# Patient Record
Sex: Male | Born: 1960 | Race: Black or African American | Hispanic: No | Marital: Single | Smoking: Current every day smoker
Health system: Southern US, Community
[De-identification: ages and names within clinical notes are randomized; demographics above are authoritative.]

## PROBLEM LIST (undated history)

## (undated) DIAGNOSIS — Z9221 Personal history of antineoplastic chemotherapy: Secondary | ICD-10-CM

## (undated) DIAGNOSIS — F1020 Alcohol dependence, uncomplicated: Secondary | ICD-10-CM

## (undated) DIAGNOSIS — C801 Malignant (primary) neoplasm, unspecified: Secondary | ICD-10-CM

## (undated) DIAGNOSIS — M1990 Unspecified osteoarthritis, unspecified site: Secondary | ICD-10-CM

## (undated) DIAGNOSIS — Z923 Personal history of irradiation: Secondary | ICD-10-CM

## (undated) HISTORY — PX: COLONOSCOPY: SHX174

## (undated) HISTORY — DX: Unspecified osteoarthritis, unspecified site: M19.90

## (undated) HISTORY — DX: Alcohol dependence, uncomplicated: F10.20

## (undated) HISTORY — DX: Personal history of antineoplastic chemotherapy: Z92.21

## (undated) HISTORY — PX: OTHER SURGICAL HISTORY: SHX169

---

## 2001-01-15 ENCOUNTER — Emergency Department (HOSPITAL_COMMUNITY): Admission: EM | Admit: 2001-01-15 | Discharge: 2001-01-15 | Payer: Self-pay | Admitting: Emergency Medicine

## 2001-01-15 ENCOUNTER — Encounter: Payer: Self-pay | Admitting: Emergency Medicine

## 2002-11-24 ENCOUNTER — Encounter: Payer: Self-pay | Admitting: Emergency Medicine

## 2002-11-24 ENCOUNTER — Emergency Department (HOSPITAL_COMMUNITY): Admission: EM | Admit: 2002-11-24 | Discharge: 2002-11-24 | Payer: Self-pay | Admitting: Emergency Medicine

## 2004-11-30 ENCOUNTER — Emergency Department (HOSPITAL_COMMUNITY): Admission: EM | Admit: 2004-11-30 | Discharge: 2004-11-30 | Payer: Self-pay | Admitting: Emergency Medicine

## 2006-08-07 ENCOUNTER — Emergency Department (HOSPITAL_COMMUNITY): Admission: EM | Admit: 2006-08-07 | Discharge: 2006-08-07 | Payer: Self-pay | Admitting: Emergency Medicine

## 2009-02-19 ENCOUNTER — Ambulatory Visit: Payer: Self-pay | Admitting: *Deleted

## 2009-02-19 ENCOUNTER — Observation Stay (HOSPITAL_COMMUNITY): Admission: EM | Admit: 2009-02-19 | Discharge: 2009-02-20 | Payer: Self-pay | Admitting: Emergency Medicine

## 2010-03-05 ENCOUNTER — Emergency Department (HOSPITAL_COMMUNITY): Admission: EM | Admit: 2010-03-05 | Discharge: 2010-03-05 | Payer: Self-pay | Admitting: Emergency Medicine

## 2011-01-27 LAB — BASIC METABOLIC PANEL
BUN: 9 mg/dL (ref 6–23)
CO2: 27 mEq/L (ref 19–32)
CO2: 28 mEq/L (ref 19–32)
Calcium: 8.7 mg/dL (ref 8.4–10.5)
Chloride: 103 mEq/L (ref 96–112)
Chloride: 104 mEq/L (ref 96–112)
Creatinine, Ser: 0.73 mg/dL (ref 0.4–1.5)
Creatinine, Ser: 0.74 mg/dL (ref 0.4–1.5)
GFR calc Af Amer: >60 mL/min (ref >60)
GFR calc Af Amer: >60 mL/min (ref >60)
Glucose, Bld: 108 mg/dL — ABNORMAL HIGH (ref 70–99)
Potassium: 4.1 mEq/L (ref 3.5–5.1)

## 2011-01-27 LAB — URINALYSIS, ROUTINE W REFLEX MICROSCOPIC
Ketones, ur: NEGATIVE mg/dL
Nitrite: NEGATIVE
Specific Gravity, Urine: 1.005 (ref 1.005–1.030)
Urobilinogen, UA: 1 mg/dL (ref 0.0–1.0)
pH: 7 (ref 5.0–8.0)

## 2011-01-27 LAB — LIPASE, BLOOD: Lipase: 31 U/L (ref 11–59)

## 2011-01-27 LAB — CBC
HCT: 36.6 % — ABNORMAL LOW (ref 39.0–52.0)
Hemoglobin: 12.5 g/dL — ABNORMAL LOW (ref 13.0–17.0)
Hemoglobin: 13.6 g/dL (ref 13.0–17.0)
MCHC: 34.1 g/dL (ref 30.0–36.0)
MCHC: 34.2 g/dL (ref 30.0–36.0)
MCHC: 34.3 g/dL (ref 30.0–36.0)
MCV: 87.7 fL (ref 78.0–100.0)
MCV: 88.2 fL (ref 78.0–100.0)
MCV: 88.6 fL (ref 78.0–100.0)
Platelets: 84 10*3/uL — ABNORMAL LOW (ref 150–400)
Platelets: 89 10*3/uL — ABNORMAL LOW (ref 150–400)
RBC: 4.1 MIL/uL — ABNORMAL LOW (ref 4.22–5.81)
RBC: 4.15 MIL/uL — ABNORMAL LOW (ref 4.22–5.81)
RBC: 4.51 MIL/uL (ref 4.22–5.81)
RDW: 13.8 % (ref 11.5–15.5)
WBC: 3.5 10*3/uL — ABNORMAL LOW (ref 4.0–10.5)
WBC: 4.4 10*3/uL (ref 4.0–10.5)

## 2011-01-27 LAB — PROTIME-INR: Prothrombin Time: 13.7 seconds (ref 11.6–15.2)

## 2011-01-27 LAB — DIFFERENTIAL
Lymphocytes Relative: 37 % (ref 12–46)
Lymphs Abs: 1.3 10*3/uL (ref 0.7–4.0)
Neutrophils Relative %: 49 % (ref 43–77)

## 2011-01-27 LAB — CARDIAC PANEL(CRET KIN+CKTOT+MB+TROPI): Total CK: 166 U/L (ref 7–232)

## 2011-01-27 LAB — TSH: TSH: 3.703 u[IU]/mL (ref 0.350–4.500)

## 2011-01-27 LAB — COMPREHENSIVE METABOLIC PANEL
BUN: 10 mg/dL (ref 6–23)
CO2: 27 mEq/L (ref 19–32)
Calcium: 9.4 mg/dL (ref 8.4–10.5)
Creatinine, Ser: 0.83 mg/dL (ref 0.4–1.5)
GFR calc Af Amer: >60 mL/min (ref >60)
GFR calc non Af Amer: >60 mL/min (ref >60)
Glucose, Bld: 96 mg/dL (ref 70–99)

## 2011-01-27 LAB — LACTATE DEHYDROGENASE: LDH: 113 U/L (ref 94–250)

## 2011-01-27 LAB — RAPID URINE DRUG SCREEN, HOSP PERFORMED
Cocaine: NOT DETECTED
Opiates: NOT DETECTED

## 2011-01-27 LAB — APTT: aPTT: 30 seconds (ref 24–37)

## 2011-03-03 NOTE — Discharge Summary (Signed)
NAMECAMILLO, QUADROS               ACCOUNT NO.:  [id number]   MEDICAL RECORD NO.:  [id number]          PATIENT TYPE:  INP   LOCATION:  2030                         FACILITY:  [facility]   PHYSICIAN:  Madaline Guthrie, M.D.    DATE OF BIRTH:  Dec 30, 1960   DATE OF ADMISSION:  02/19/2009  DATE OF DISCHARGE:  02/20/2009                               DISCHARGE SUMMARY   ATTENDING PHYSICIAN:  Dr. Wyonia Hough.   DISCHARGE DIAGNOSES:  1. Rectal bleeding - secondary to internal hemorrhoids and anal      fissure per sigmoidoscopy results.  2. Polysubstance abuse, alcohol and tobacco - chronic, still currently      using.  3. Thrombocytopenia and leukopenia - likely secondary to chronic      alcohol use, unknown baseline.   DISCHARGE MEDICATIONS:  1. MiraLax 17 g powder use once daily.  2. Anusol cream use 2 to 3 times daily intra-anally using applicator      tip.  3. Multivitamin take 1 tablet daily.   DISPOSITION AND FOLLOW-UP:  The patient was discharged from the hospital  in stable condition with continued small rectal bleed.  She will follow  up in outpatient clinic with Dr. Aldine Contes on May 27 at 2:30 p.m.  On  follow-up appointment, may check CBCs since noted during the admission  to have leukopenia as well as thrombocytopenia.  During the  hospitalization, flexible sigmoidoscopy was done, and the patient was  found to have internal hemorrhoids and anal fissure which was thought to  be source of bleeding.  Per Dr. Donavan Burnet recommendation, the patient is  to use daily MiraLax and Anusol cream intra-anally.  Colonoscopy is  recommended in 2-3 years given his age and alcohol use as well as  recurrent bleeding in future if the patient desires to have hemorrhoids  and fissure repairs, surgical referral can be made but not necessary at  this time.  Please follow up on abstinence from alcohol and smoking and  assess if the patient has been compliant with medicine and cream  recommended.   PROCEDURES:   May 4, acute abdominal series shows no acute findings chest  or abdomen, COPD changes.  Feb 20, 2009, flexible sigmoidoscopy showed anal fissure in distal rectum  with clean base and large multiple internal hemorrhoids, nonbloody  stool, brown within no rectosigmoid pathologies.   CONSULTATIONS:  Dr. Matthias Hughs, GI.   HISTORY OF PRESENT ILLNESS:  A 50 year old male with chronic alcohol  use, daily use of aspirin over 1 year, no other significant medical  history, presents to ED with main concern of bright red blood per rectum  less than 1/2 cup coating bowel movement noticed 2 days prior to  admission.  Occasionally but not consistently associated with lower  quadrant abdominal pain, nonradiating, but improved with applying  pressure to the area.  In addition, reports dizziness upon standing that  started 2 days ago as well.  Has had similar episode in the past which  resolved on its own.  The patient denies rectal pain, tenesmus, rectal  trauma.  No systemic symptoms, nausea, vomiting, diarrhea, constipation.  No urinary concerns.  No family history of colorectal cancer.   PHYSICAL EXAMINATION:  The patient lying in bed not in acute distress.  HEENT:  No scleral icterus.  No conjunctival pallor.  Moist oral mucosa.  CARDIOVASCULAR SYSTEM:  Regular rate and rhythm.  Heart rate 68.  No  murmurs.  No JVD.  LUNGS:  Clear to auscultation bilaterally.  No wheezing.  Good air  movement.  ABDOMEN:  Soft, nontender, nondistended.  Bowel sounds normal.  Mild  lower abdominal prominence with straining.  No guarding or rebound.  EXTREMITIES:  No edema.  No cyanosis.  GENITOURINARY:  No external fissures.  No masses.  Good rectal tone.  Brown stool.  FOBT positive.  NEUROLOGICAL:  Nonfocal, tremor at outstretched hands.  VITAL SIGNS:  Temperature 98.6, blood pressure 107/68, pulse 68,  respirations 18, saturating 100% on room air.   LABORATORY DATA:  Hemoglobin 13.6, white blood cells 3.6 and  platelets  96, MCV 88.  Lipase 31.  UDS negative.  Lactic acid 1.4.  LDH 113.  PT  13.7, INR 1, PTT 30.  TSH 3.703.   HOSPITAL COURSE:  1. Bright red blood per rectum, based on flexible sigmoidoscopy      findings secondary to internal hemorrhoids and anal fissure.  No      polyps or other rectosigmoid pathologies noted.  Stool was brown      and nonbloody.  Given the patient's age and risk factors,      colonoscopy is recommended in 2-3 years which can be done in an      outpatient setting.  Surgical referral might be needed in future if      the patient desires, not indicated at this time.  CBC and coags      were stable during the admission in terms of hemoglobin (except for      WBCs and platelets).  The patient was recommended to use MiraLax      powder and Anusol cream daily.  2. Thrombocytopenia and leukopenia, likely related to chronic alcohol      use.  Unknown baseline, but this can be followed up in outpatient      setting.  Will repeat CBC and determine if the numbers on this      admission are indeed patient's baseline.  3. Polysubstance abuse.  Ileus was negative for cocaine, THC or      benzodiazepines.  However, the patient continues to abuse tobacco      and alcohol.  Cessation consult was provided, and the patient seems      to have rather good insight into risk of continued use.  Will      follow up on this issue in outpatient clinic as well.   VITALS ON DISCHARGE:  Temperature 98, pulse 52, respirations 20, blood  pressure 116/66, saturating 99% on room air.   LABORATORY DATA:  CBC:  White blood cell 3.5, hemoglobin 12.4, platelets  89.  Sodium 134, potassium 4, chloride 103, bicarb 27, BUN 9, creatinine  0.74, glucose 108 and calcium 8.7.  Cardiac panel within normal limits.   Over 30 minutes was spent on discharging the patient.      Mliss Sax, MD  Electronically Signed      Madaline Guthrie, M.D.  Electronically Signed    IM/MEDQ  D:  02/20/2009   T:  02/20/2009  Job:  [id number]

## 2011-03-03 NOTE — Consult Note (Signed)
[name], [name]               ACCOUNT NO.:  [id number]   MEDICAL RECORD NO.:  [id number]          PATIENT TYPE:  INP   LOCATION:  2030                         FACILITY:  [facility]   PHYSICIAN:  Bernette Redbird, M.D.   DATE OF BIRTH:  03/06/1961   DATE OF CONSULTATION:  02/19/2009  DATE OF DISCHARGE:                                 CONSULTATION   The Internal Medicine Teaching Service (Dr. Wyonia Hough, attending) asked [name]  to see this 50 year old gentleman because of rectal bleeding.   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  He has a history of periodic rectal  bleeding, which is painless and consists of bright red blood per rectum  in association with defecation.  It typically last for couple of days  and then goes away for a period of time.  He presented to the emergency  room today because of this bleeding and recent feelings of weakness, but  his hemoglobin in the emergency room was normal.  There is no family  history of colorectal neoplasia.   The current episode he has only consisted of two bowel movements  associated with blood over the past 2 days.  The blood is in the toilet  bowl and seems to the stool, but not be remixed with it.  No  associated perianal pain, no history of constipation.   PAST MEDICAL HISTORY:  No known allergies.   OUTPATIENT MEDICATIONS:  Daily aspirin, occasional Advil, and Goody  powders several times a week.   OPERATIONS:  I believe he has had surgery for an incarcerated hernia.   CHRONIC MEDICAL ILLNESSES:  None.   HABITS:  The patient consumes ethanol daily including gin on the  weekends and about 200 ounces of beer per day.  He smokes one pack per  day.   FAMILY HISTORY:  Negative for colorectal neoplasia.   SOCIAL HISTORY:  Lives alone, does construction work, has a tenth grade  education.   REVIEW OF SYSTEMS:  See HPI.   PHYSICAL EXAMINATION:  GENERAL:  A lean, wiry,  healthy-appearing African  American male in no acute distress.  Anicteric.  No frank pallor.  CHEST:  Clear.  HEART:  Normal.  ABDOMEN:  Muscular, but without any evident mass or tenderness.   LABORATORY DATA:  Stool was hemoccult positive in the emergency room.  Hemoglobin 13.6, platelets 96,000.  AST 46, INR normal.   IMPRESSION:  Recurrent painless intermittent small volume hematochezia  in a pattern most consistent with hemorrhoidal bleeding.  Despite risk  factors for ulcer disease, there is no clinical suggestion of an acute  upper GI hemorrhage, in that he is not having profuse bleeding, clinical  instability, or a drop in hemoglobin.   PLAN:  I do feel that evaluation is warranted since he is in his 110s and  has previously unevaluated rectal bleeding.  We offered him the options  of expectant management, which I did not prefer, sigmoidoscopic  evaluation, which would address specifically the rectal bleeding he is  having, or a full colonoscopy, which  would address the rectal bleeding  plus screen for colon cancer.  He prefers sigmoidoscopic evaluation at  this time.  We will plan to do it tomorrow, unprepped, to help be sure  the blood is not coming from higher up.  The minimal risks of that  procedure were reviewed and the patient is agreeable.           ______________________________  Bernette Redbird, M.D.     RB/MEDQ  D:  02/19/2009  T:  02/20/2009  Job:  [id number]

## 2011-03-03 NOTE — Op Note (Signed)
NAMEDARE, SANGER               ACCOUNT NO.:  [id number]   MEDICAL RECORD NO.:  [id number]          PATIENT TYPE:  OBV   LOCATION:  2030                         FACILITY:  [facility]   PHYSICIAN:  Bernette Redbird, M.D.   DATE OF BIRTH:  [birth year]   DATE OF PROCEDURE:  02/20/2009  DATE OF DISCHARGE:  02/20/2009                               OPERATIVE REPORT   PROCEDURE:  Flexible sigmoidoscopy.   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   FINDINGS:  Probable anal fissure and prominent internal hemorrhoids.  No  blood in the rectosigmoid lumen.   PROCEDURE IN DETAIL:  The nature, purpose, and risks of the procedure  have been discussed with the patient who provided written consent and  was brought from his hospital room to the Endoscopy Unit.  This  procedure was done unprepped and unsedated.  The reason for no prep was  to try to ascertain whether or not blood was coming from above.  The  Pentax pediatric video colonoscope was inserted following the perianal  exam that was negative and a digital rectal exam that was pertinent for  a marked increase in anal sphincter tone.  The prostate bed was flat.   There was no blood whatsoever in the rectosigmoid lumen, up to the limit  of the exam which was approximately 30 cm.  There was a small to  moderate amount of formed stool, scattered here and there in the  rectosigmoid lumen.  It is not felt that this would have obscured any  major or diffuse abnormalities.  The stool was completely nonbloody in  appearance.  The rectum had a very nice vascular pattern, no evidence of  proctitis, no evidence of colitis, no evidence of diverticular disease,  and no evidence of polyps or masses.   A retroflexed view of the distal rectum showed a linear slit with smooth  overlying mucosa suggestive of an anal  fissure, but without any stigma  of hemorrhage.  Pullout through the anal canal demonstrated moderate  internal hemorrhoids.   No biopsies were obtained.  The patient tolerated the procedure well and  there were no apparent complications.   IMPRESSION:  Rectal outlet bleeding.  Possible anal fissure, but the  pattern of bleeding including the absence of pain and the generous  amount of blood brings to mind hemorrhoidal bleeding rather than an anal  fissure.   RECOMMENDATIONS:  1. Reassurance.  2. Maintenance of soft stools, if necessary by use of fiber      supplementation and/or MiraLax  3. If the patient would like definitive treatment of his apparent      hemorrhoidal bleeding, surgical consultation would be appropriate.      In the meantime, intra-anal application of ointments such as Anusol      might help a little bit, although in general, they were better for      hemorrhoidal irritation than hemorrhoidal bleeding.           ______________________________  Bernette Redbird,  M.D.     RB/MEDQ  D:  02/20/2009  T:  02/21/2009  Job:  [id number]

## 2011-03-06 NOTE — Consult Note (Signed)
[facility]  Patient:    [name], [name]                      MRN: [id number] Proc. Date: 01/15/01 Adm. Date:  [id number] Disc. Date: [id number] Attending:  Benny Lennert CC:         [facility]   Consultation Report  CHIEF COMPLAINT:  Right facial swelling.  HISTORY OF PRESENT ILLNESS:  The patient is a 50 year old black male, 9 or 10 days ago noted that his posterior right upper maxillary molar was giving him discomfort. It has previously been broken. He was attempting to get to [facility] and have a dentist evaluate him. Four or five days ago, he developed a swelling at the right angle of mandible which has gotten progressively larger and slightly tender. No a trismus. No breathing difficulty. He placed an aspirin tablet in his posterior right gingival buccal fold last evening to try to numb the pain which did not help. No breathing or swallowing difficulty. He does smoke one pack per day. No prior history of cancer. No other neck masses that he is aware of. No fever. He did not have much acne in his youth. He does have an apparent cystic lesion in the skin over the right malar eminence which has been more or less quiescent over several years time.  ALLERGIES:  No known medical allergies.  CURRENT MEDICATIONS:  None.  PAST MEDICAL HISTORY:  No exposure to HIV. No diabetes.  PHYSICAL EXAMINATION:  GENERAL:  This is a trim, basically healthy-appearing, middle-aged, black male.  HEENT:  He has a large swelling over the right angle of the mandible which is slightly tender, slightly fluctuant, and with no overlying skin changes. No facial weakness. He has a hyperpigmented area over the malar skin with some slight fluctuants beneath that is consistent with a chronic dermal cyst. Cranial nerves intact. Ear canals clear. Anterior nose clear. Oral cavity reveals several missing teeth, several teeth eroded down to the gum line,  and several broken posterior molars, especially the right maxillary third molar. He has significant leukoplakia and irregular configuration of the mucosa of the posterior right gingival buccal fold that is consistent with erosion by the aspirin yesterday. No clear evidence of cancer. Oropharynx otherwise clear. Did not examine nasopharynx or hypopharynx.  NECK:  Remarkable for a 1.5 cm slightly fluctuant fullness over the angle of the mandible on the right side and the facial lesion as described above. No other neck adenopathy. He does have a 3-4 cm soft subcutaneous mass in the right posterior triangle consistent with lipoma.  LABORATORY DATA:  X-ray:  Panoramic x-ray showed missing teeth, caries, but no sign of a dental abscess or fracture.  CT scan showed a fluid-filled lesion over the right angle of the mandible consistent with an abscess.  IMPRESSION:  Probable odontogenic abscess. Rule out actinomycosis. Rule out atypical Mycobacterium.  PROCEDURE:  With informed consent, cleaned and anesthetize the area with 1% Xylocaine with 1:100,000 epinephrine, 5 cc total. An 18-gauge needle was placed into the cyst or abscess cavity and approximately 3 cc of bloody, frank pus with an anaerobic odor was evacuated. This was sent for cultures and Gram stain for acid fast, actinomycosis, and routine aerobic and anaerobic bacterial cultures.  Following this, a 5 ml incision was made into the abscess cavity with the incision lying in the relaxed skin tension line. Moderate blood and pus were evacuated. The wound was irrigated  with approximately 30 cc of sterile saline. A 1/4-inch Penrose drain was placed into the abscess cavity and secured to the skin with a 3-0 nylon stitch. Hemostasis was spontaneous. The patient tolerated the procedure nicely. An absorbent dressing was placed.  PLAN:  Gave him a prescription for penicillin VK 500 mg to take at the rate of 1 g q.i.d. for the first 2 days,  then back off to 0.5 g q.i.d. I would like to see him back in my office in 2 days to remove the drain. I would like him to get with a dentist as early as possible to have the teeth dealt with to prevent this from having further difficulties. He understands and agrees. DD:  01/15/01 TD:  01/16/01 Job: [id number]

## 2011-05-14 ENCOUNTER — Emergency Department (HOSPITAL_COMMUNITY)
Admission: EM | Admit: 2011-05-14 | Discharge: 2011-05-14 | Disposition: A | Discharge status: Home / Self Care | Payer: Self-pay | Attending: Emergency Medicine | Admitting: Emergency Medicine

## 2011-05-14 DIAGNOSIS — F17200 Nicotine dependence, unspecified, uncomplicated: Secondary | ICD-10-CM | POA: Insufficient documentation

## 2011-05-14 DIAGNOSIS — R300 Dysuria: Secondary | ICD-10-CM | POA: Insufficient documentation

## 2011-05-14 DIAGNOSIS — N342 Other urethritis: Secondary | ICD-10-CM | POA: Insufficient documentation

## 2011-05-14 LAB — URINALYSIS, ROUTINE W REFLEX MICROSCOPIC
Bilirubin Urine: NEGATIVE
Glucose, UA: NEGATIVE mg/dL
Ketones, ur: NEGATIVE mg/dL
Nitrite: NEGATIVE
Protein, ur: NEGATIVE mg/dL
Specific Gravity, Urine: 1.012 (ref 1.005–1.030)
Urobilinogen, UA: 1 mg/dL (ref 0.0–1.0)
pH: 6.5 (ref 5.0–8.0)

## 2011-05-14 LAB — URINE MICROSCOPIC-ADD ON

## 2012-03-25 ENCOUNTER — Encounter (HOSPITAL_COMMUNITY): Payer: Self-pay

## 2012-03-25 ENCOUNTER — Emergency Department (HOSPITAL_COMMUNITY)
Admission: EM | Admit: 2012-03-25 | Discharge: 2012-03-25 | Disposition: A | Discharge status: Home / Self Care | Payer: Self-pay | Attending: Emergency Medicine | Admitting: Emergency Medicine

## 2012-03-25 DIAGNOSIS — F1010 Alcohol abuse, uncomplicated: Secondary | ICD-10-CM | POA: Insufficient documentation

## 2012-03-25 DIAGNOSIS — F1410 Cocaine abuse, uncomplicated: Secondary | ICD-10-CM | POA: Insufficient documentation

## 2012-03-25 LAB — RAPID URINE DRUG SCREEN, HOSP PERFORMED
Amphetamines: NOT DETECTED
Barbiturates: NOT DETECTED
Cocaine: POSITIVE — AB
Opiates: NOT DETECTED
Tetrahydrocannabinol: NOT DETECTED

## 2012-03-25 LAB — BASIC METABOLIC PANEL
BUN: 11 mg/dL (ref 6–23)
Calcium: 9.8 mg/dL (ref 8.4–10.5)
GFR calc non Af Amer: >90 mL/min (ref >90)
Glucose, Bld: 117 mg/dL — ABNORMAL HIGH (ref 70–99)

## 2012-03-25 LAB — CBC
HCT: 40.6 % (ref 39.0–52.0)
Hemoglobin: 13.7 g/dL (ref 13.0–17.0)
MCH: 29.3 pg (ref 26.0–34.0)
MCHC: 33.7 g/dL (ref 30.0–36.0)
RDW: 14.5 % (ref 11.5–15.5)

## 2012-03-25 MED ORDER — ALUM & MAG HYDROXIDE-SIMETH 200-200-20 MG/5ML PO SUSP: 30.0000 mL | ORAL | Status: DC | PRN

## 2012-03-25 MED ORDER — VITAMIN B-1 100 MG PO TABS
100.0000 mg | ORAL_TABLET | Freq: Every day | ORAL | Status: DC
  Administered 2012-03-25: 100 mg via ORAL
  Filled 2012-03-25: qty 1

## 2012-03-25 MED ORDER — FOLIC ACID 1 MG PO TABS
1.0000 mg | ORAL_TABLET | Freq: Every day | ORAL | Status: DC
  Administered 2012-03-25: 1 mg via ORAL
  Filled 2012-03-25: qty 1

## 2012-03-25 MED ORDER — ACETAMINOPHEN 325 MG PO TABS: 650.0000 mg | ORAL_TABLET | ORAL | Status: DC | PRN

## 2012-03-25 MED ORDER — LORAZEPAM 1 MG PO TABS: 1.0000 mg | ORAL_TABLET | Freq: Four times a day (QID) | ORAL | Status: DC | PRN

## 2012-03-25 MED ORDER — LORAZEPAM 2 MG/ML IJ SOLN: 1.0000 mg | Freq: Four times a day (QID) | INTRAMUSCULAR | Status: DC | PRN

## 2012-03-25 MED ORDER — NICOTINE 21 MG/24HR TD PT24
21.0000 mg | MEDICATED_PATCH | Freq: Every day | TRANSDERMAL | Status: DC
  Administered 2012-03-25: 21 mg via TRANSDERMAL
  Filled 2012-03-25: qty 1

## 2012-03-25 MED ORDER — THIAMINE HCL 100 MG/ML IJ SOLN: 100.0000 mg | Freq: Every day | INTRAMUSCULAR | Status: DC

## 2012-03-25 MED ORDER — IBUPROFEN 200 MG PO TABS: 600.0000 mg | ORAL_TABLET | Freq: Three times a day (TID) | ORAL | Status: DC | PRN

## 2012-03-25 MED ORDER — ONDANSETRON HCL 8 MG PO TABS: 4.0000 mg | ORAL_TABLET | Freq: Three times a day (TID) | ORAL | Status: DC | PRN

## 2012-03-25 MED ORDER — ZOLPIDEM TARTRATE 5 MG PO TABS: 5.0000 mg | ORAL_TABLET | Freq: Every evening | ORAL | Status: DC | PRN

## 2012-03-25 MED ORDER — ADULT MULTIVITAMIN W/MINERALS CH
1.0000 | ORAL_TABLET | Freq: Every day | ORAL | Status: DC
  Administered 2012-03-25: 1 via ORAL
  Filled 2012-03-25 (×2): qty 1

## 2012-03-25 NOTE — ED Provider Notes (Signed)
Medical screening examination/treatment/procedure(s) were performed by non-physician practitioner and as supervising physician I was immediately available for consultation/collaboration.   Dione Booze, MD 03/25/12 [phone]

## 2012-03-25 NOTE — ED Notes (Signed)
Pt accepted to [facility], working on getting his ride available

## 2012-03-25 NOTE — BH Assessment (Signed)
Assessment Note   [name] is an 51 y.o. male that presented requesting detox from Cocaine and ETOH.  Pt reports relapsing three months ago after a stint at [facility] and prior treatment at [facility].  Pt reports drinking approximately 5 40 oz drinks QD + 1 pint at times and 30 dollars worth of Cocaine every few days.  Pt reports feeling slightly nauseaus, anxious, and tremulous.  Pt has no previous mental health history and denies SI, HI, or any active psychosis.  Pt is able to contract for safety.  Pt voices minimal supports and is currently homeless.  Pt states that he is vested in treatment and in going to rehab afterwards.   Axis I: Alcohol Abuse Axis II: Deferred Axis III: History reviewed. No pertinent past medical history. Axis IV: housing problems, other psychosocial or environmental problems and problems with primary support group Axis V: 41-50 serious symptoms  Past Medical History: History reviewed. No pertinent past medical history.  History reviewed. No pertinent past surgical history.  Family History: No family history on file.  Social History:  reports that he has been smoking.  He does not have any smokeless tobacco history on file. He reports that he drinks alcohol. He reports that he uses illicit drugs (Cocaine).  Additional Social History:  Alcohol / Drug Use Pain Medications: No Prescriptions: No Over the Counter: No History of alcohol / drug use?: Yes Substance #1 Name of Substance 1: Cocaine 1 - Age of First Use: 31 1 - Amount (size/oz): 30 dollars 1 - Frequency: every weekend 1 - Duration: 3 months 1 - Last Use / Amount: two days ago Substance #2 Name of Substance 2: ETOH 2 - Age of First Use: 19 2 - Amount (size/oz): 5 40's  2 - Frequency: QD 2 - Duration: 3 mths 2 - Last Use / Amount: yesterday  CIWA: CIWA-Ar BP: 114/71 mmHg Pulse Rate: 69  Nausea and Vomiting: 2 Tactile Disturbances: none Tremor: two Auditory Disturbances: not present Paroxysmal  Sweats: no sweat visible Visual Disturbances: not present Anxiety: two Headache, Fullness in Head: none present Agitation: normal activity Orientation and Clouding of Sensorium: oriented and can do serial additions CIWA-Ar Total: 6  COWS:    Allergies: No Known Allergies  Home Medications:  (Not in a hospital admission)  OB/GYN Status:  No LMP for male patient.  General Assessment Data Location of Assessment: [facility] ED Living Arrangements: Other (Comment) Can pt return to current living arrangement?: No Admission Status: Voluntary Is patient capable of signing voluntary admission?: Yes Transfer from: Acute Hospital Referral Source: MD  Education Status Is patient currently in school?: No  Risk to self Suicidal Ideation: No Suicidal Intent: No Is patient at risk for suicide?: No Suicidal Plan?: No Access to Means: No What has been your use of drugs/alcohol within the last 12 months?: Relapsed on Crack and ETOH x3 months ago Previous Attempts/Gestures: No How many times?: 0  Triggers for Past Attempts: Unpredictable Intentional Self Injurious Behavior: None Family Suicide History: No Recent stressful life event(s): Conflict (Comment);Turmoil (Comment) Persecutory voices/beliefs?: No Depression: No Depression Symptoms: Guilt;Loss of interest in usual pleasures Suicide prevention information given to non-admitted patients: Not applicable  Risk to Others Homicidal Ideation: No Thoughts of Harm to Others: No Current Homicidal Intent: No Current Homicidal Plan: No Access to Homicidal Means: No Identified Victim: n/a History of harm to others?: No Assessment of Violence: None Noted Violent Behavior Description: n/a Does patient have access to weapons?: No Criminal Charges  Pending?: No Does patient have a court date: No  Psychosis Hallucinations: None noted Delusions: None noted  Mental Status Report Eye Contact: Good Motor Activity: Unremarkable Speech:  Logical/coherent Level of Consciousness: Quiet/awake Mood: Ambivalent Affect: Apathetic;Sad Anxiety Level: Moderate Thought Processes: Relevant Judgement: Impaired Orientation: Person;Place;Time;Situation Obsessive Compulsive Thoughts/Behaviors: Minimal  Cognitive Functioning Concentration: Decreased Memory: Recent Intact;Remote Impaired IQ: Average Insight: Fair Impulse Control: Poor Appetite: Poor Weight Loss: 5  Weight Gain: 0  Sleep: Decreased Total Hours of Sleep: 6  Vegetative Symptoms: None  ADLScreening [facility] Assessment Services) Patient's cognitive ability adequate to safely complete daily activities?: Yes Patient able to express need for assistance with ADLs?: Yes Independently performs ADLs?: Yes  Abuse/Neglect [facility]) Physical Abuse: Denies Verbal Abuse: Denies Sexual Abuse: Denies  Prior Inpatient Therapy Prior Inpatient Therapy: Yes Prior Therapy Dates: 4 months ago Prior Therapy Facilty/Provider(s): [facility] Reason for Treatment: rehabilitation  Prior Outpatient Therapy Prior Outpatient Therapy: Yes Prior Therapy Dates: years ago Prior Therapy Facilty/Provider(s): [facility] Reason for Treatment: substance abuse  ADL Screening (condition at time of admission) Patient's cognitive ability adequate to safely complete daily activities?: Yes Patient able to express need for assistance with ADLs?: Yes Independently performs ADLs?: Yes       Abuse/Neglect Assessment (Assessment to be complete while patient is alone) Physical Abuse: Denies Verbal Abuse: Denies Sexual Abuse: Denies Values / Beliefs Cultural Requests During Hospitalization: None Spiritual Requests During Hospitalization: None        Additional Information 1:1 In Past 12 Months?: No CIRT Risk: No Elopement Risk: No Does patient have medical clearance?: Yes     Disposition: Referred to [facility] for inpatient detox. Disposition Disposition of Patient: Referred to [facility]) Patient referred  to: [facility]  On Site Evaluation by:   Reviewed with Physician:     Angelica Ran 03/25/2012 2:59 PM

## 2012-03-25 NOTE — ED Notes (Signed)
Request hep for detox from alcohol and cocaine.  Pt. Was at [facility] but relapsed.  He would like to go back there.   Pt.  Feels weary

## 2012-03-25 NOTE — ED Provider Notes (Signed)
History     CSN: [id number]  Arrival date & time 03/25/12  1227   First MD Initiated Contact with Patient 03/25/12 1312      Chief Complaint  Patient presents with  . Medical Clearance    (Consider location/radiation/quality/duration/timing/severity/associated sxs/prior treatment) HPI  Patient to the ED requesting detox from alcohol and cocaine. He was discharged from [facility] 4 months ago, last 1 month sober and then has been relapsing for the last 3 months. He denies being HI or SI and he does not have any psychosis. Pt in NAD and VSS   History reviewed. No pertinent past medical history.  History reviewed. No pertinent past surgical history.  No family history on file.  History  Substance Use Topics  . Smoking status: Current Everyday Smoker  . Smokeless tobacco: Not on file  . Alcohol Use: Yes      Review of Systems   HEENT: denies blurry vision or change in hearing PULMONARY: Denies difficulty breathing and SOB CARDIAC: denies chest pain or heart palpitations MUSCULOSKELETAL:  denies being unable to ambulate ABDOMEN AL: denies abdominal pain GU: denies loss of bowel or urinary control NEURO: denies numbness and tingling in extremities      Allergies  Review of patient's allergies indicates no known allergies.  Home Medications  No current outpatient prescriptions on file.  BP 114/71  Pulse 69  Temp(Src) 98.7 F (37.1 C) (Oral)  Resp 16  SpO2 99%  Physical Exam  Nursing note and vitals reviewed. Constitutional: He appears well-developed and well-nourished. No distress.  HENT:  Head: Normocephalic and atraumatic.  Eyes: Pupils are equal, round, and reactive to light.  Neck: Normal range of motion. Neck supple.  Cardiovascular: Normal rate and regular rhythm.   Pulmonary/Chest: Effort normal.  Abdominal: Soft.  Neurological: He is alert.  Skin: Skin is warm and dry.    ED Course  Procedures (including critical care time)   Labs Reviewed   URINE RAPID DRUG SCREEN (HOSP PERFORMED)  CBC  BASIC METABOLIC PANEL  ETHANOL   No results found.   1. Cocaine abuse   2. Alcohol abuse       MDM  ACT to consult         Dorthula Matas, PA 03/25/12 1344

## 2012-03-25 NOTE — Discharge Instructions (Signed)
Alcohol Problems Most adults who drink alcohol drink in moderation (not a lot) are at low risk for developing problems related to their drinking. However, all drinkers, including low-risk drinkers, should know about the health risks connected with drinking alcohol. RECOMMENDATIONS FOR LOW-RISK DRINKING  Drink in moderation. Moderate drinking is defined as follows:   Men - no more than 2 drinks per day.   Nonpregnant women - no more than 1 drink per day.   Over age 97 - no more than 1 drink per day.  A standard drink is 12 grams of pure alcohol, which is equal to a 12 ounce bottle of beer or wine cooler, a 5 ounce glass of wine, or 1.5 ounces of distilled spirits (such as whiskey, brandy, vodka, or rum).  ABSTAIN FROM (DO NOT DRINK) ALCOHOL:  When pregnant or considering pregnancy.   When taking a medication that interacts with alcohol.   If you are alcohol dependent.   A medical condition that prohibits drinking alcohol (such as ulcer, liver disease, or heart disease).  DISCUSS WITH YOUR CAREGIVER:  If you are at risk for coronary heart disease, discuss the potential benefits and risks of alcohol use: Light to moderate drinking is associated with lower rates of coronary heart disease in certain populations (for example, men over age 87 and postmenopausal women). Infrequent or nondrinkers are advised not to begin light to moderate drinking to reduce the risk of coronary heart disease so as to avoid creating an alcohol-related problem. Similar protective effects can likely be gained through proper diet and exercise.   Women and the elderly have smaller amounts of body water than men. As a result women and the elderly achieve a higher blood alcohol concentration after drinking the same amount of alcohol.   Exposing a fetus to alcohol can cause a broad range of birth defects referred to as Fetal Alcohol Syndrome (FAS) or Alcohol-Related Birth Defects (ARBD). Although FAS/ARBD is connected with  excessive alcohol consumption during pregnancy, studies also have reported neurobehavioral problems in infants born to mothers reporting drinking an average of 1 drink per day during pregnancy.   Heavier drinking (the consumption of more than 4 drinks per occasion by men and more than 3 drinks per occasion by women) impairs learning (cognitive) and psychomotor functions and increases the risk of alcohol-related problems, including accidents and injuries.  CAGE QUESTIONS:   Have you ever felt that you should Cut down on your drinking?   Have people Annoyed you by criticizing your drinking?   Have you ever felt bad or Guilty about your drinking?   Have you ever had a drink first thing in the morning to steady your nerves or get rid of a hangover (Eye opener)?  If you answered positively to any of these questions: You may be at risk for alcohol-related problems if alcohol consumption is:   Men: Greater than 14 drinks per week or more than 4 drinks per occasion.   Women: Greater than 7 drinks per week or more than 3 drinks per occasion.  Do you or your family have a medical history of alcohol-related problems, such as:  Blackouts.   Sexual dysfunction.   Depression.   Trauma.   Liver dysfunction.   Sleep disorders.   Hypertension.   Chronic abdominal pain.   Has your drinking ever caused you problems, such as problems with your family, problems with your work (or school) performance, or accidents/injuries?   Do you have a compulsion to drink or a preoccupation  Do you have a compulsion to drink or a preoccupation with drinking?   Do you have poor control or are you unable to stop drinking once you have started?   Do you have to drink to avoid withdrawal symptoms?   Do you have problems with withdrawal such as tremors, nausea, sweats, or mood disturbances?   Does it take more alcohol than in the past to get you high?   Do you feel a strong urge to drink?   Do you change your plans so that you can have a drink?   Do you ever drink in the morning to relieve  the shakes or a hangover?  If you have answered a number of the previous questions positively, it may be time for you to talk to your caregivers, family, and friends and see if they think you have a problem. Alcoholism is a chemical dependency that keeps getting worse and will eventually destroy your health and relationships. Many alcoholics end up dead, impoverished, or in prison. This is often the end result of all chemical dependency.   Do not be discouraged if you are not ready to take action immediately.   Decisions to change behavior often involve up and down desires to change and feeling like you cannot decide.   Try to think more seriously about your drinking behavior.   Think of the reasons to quit.  WHERE TO GO FOR ADDITIONAL INFORMATION    The National Institute on Alcohol Abuse and Alcoholism (NIAAA)www.niaaa.nih.gov   National Council on Alcoholism and Drug Dependence (NCADD)www.ncadd.org   American Society of Addiction Medicine (ASAM)www.asam.org  Document Released: 10/05/2005 Document Revised: 09/24/2011 Document Reviewed: 05/23/2008  Exit[employer] Patient Information 2012 [employer].

## 2012-03-25 NOTE — ED Notes (Signed)
Pt presents to department for evaluation of detox from crack cocaine and ETOH. Pt states he drinks x1 pint of white liquor and 40oz of beer everyday, and also smokes crack every weekend. Pt states he was recently in the detox program at [facility], but has relapsed. Pt requesting long term detox today. Denies pain. Pt is conscious alert and oriented x4. He is calm and cooperative at the time. Denies SI/HI at present.

## 2013-05-24 ENCOUNTER — Emergency Department (HOSPITAL_COMMUNITY): Payer: Self-pay

## 2013-05-24 ENCOUNTER — Encounter (HOSPITAL_COMMUNITY): Payer: Self-pay | Admitting: Adult Health

## 2013-05-24 ENCOUNTER — Emergency Department (HOSPITAL_COMMUNITY)
Admission: EM | Admit: 2013-05-24 | Discharge: 2013-05-24 | Disposition: A | Discharge status: Home / Self Care | Payer: Self-pay | Attending: Emergency Medicine | Admitting: Emergency Medicine

## 2013-05-24 DIAGNOSIS — F17200 Nicotine dependence, unspecified, uncomplicated: Secondary | ICD-10-CM | POA: Insufficient documentation

## 2013-05-24 DIAGNOSIS — Y929 Unspecified place or not applicable: Secondary | ICD-10-CM | POA: Insufficient documentation

## 2013-05-24 DIAGNOSIS — K5900 Constipation, unspecified: Secondary | ICD-10-CM

## 2013-05-24 DIAGNOSIS — R195 Other fecal abnormalities: Secondary | ICD-10-CM | POA: Insufficient documentation

## 2013-05-24 DIAGNOSIS — S6990XA Unspecified injury of unspecified wrist, hand and finger(s), initial encounter: Secondary | ICD-10-CM | POA: Insufficient documentation

## 2013-05-24 DIAGNOSIS — K625 Hemorrhage of anus and rectum: Secondary | ICD-10-CM

## 2013-05-24 DIAGNOSIS — F1010 Alcohol abuse, uncomplicated: Secondary | ICD-10-CM

## 2013-05-24 DIAGNOSIS — Y9389 Activity, other specified: Secondary | ICD-10-CM | POA: Insufficient documentation

## 2013-05-24 LAB — OCCULT BLOOD, POC DEVICE: Fecal Occult Bld: POSITIVE — AB

## 2013-05-24 LAB — COMPREHENSIVE METABOLIC PANEL
Alkaline Phosphatase: 60 U/L (ref 39–117)
BUN: 10 mg/dL (ref 6–23)
GFR calc Af Amer: >90 mL/min (ref >90)
Glucose, Bld: 100 mg/dL — ABNORMAL HIGH (ref 70–99)
Potassium: 4.3 mEq/L (ref 3.5–5.1)
Total Bilirubin: 0.3 mg/dL (ref 0.3–1.2)
Total Protein: 7.8 g/dL (ref 6.0–8.3)

## 2013-05-24 LAB — CBC WITH DIFFERENTIAL/PLATELET
Eosinophils Absolute: 0.3 10*3/uL (ref 0.0–0.7)
Eosinophils Relative: 6 % — ABNORMAL HIGH (ref 0–5)
HCT: 40.2 % (ref 39.0–52.0)
Hemoglobin: 13.5 g/dL (ref 13.0–17.0)
Lymphs Abs: 2.3 10*3/uL (ref 0.7–4.0)
MCH: 29.5 pg (ref 26.0–34.0)
MCV: 88 fL (ref 78.0–100.0)
Monocytes Relative: 15 % — ABNORMAL HIGH (ref 3–12)
Neutrophils Relative %: 27 % — ABNORMAL LOW (ref 43–77)
RBC: 4.57 MIL/uL (ref 4.22–5.81)

## 2013-05-24 LAB — PROTIME-INR: Prothrombin Time: 12.9 seconds (ref 11.6–15.2)

## 2013-05-24 MED ORDER — SODIUM CHLORIDE 0.9 % IV SOLN
1000.0000 mL | INTRAVENOUS | Status: DC
  Administered 2013-05-24: 1000 mL via INTRAVENOUS

## 2013-05-24 MED ORDER — POLYETHYLENE GLYCOL 3350 17 GM/SCOOP PO POWD: 17.0000 g | Freq: Two times a day (BID) | ORAL | Status: DC

## 2013-05-24 MED ORDER — SODIUM CHLORIDE 0.9 % IV SOLN
1000.0000 mL | Freq: Once | INTRAVENOUS | Status: AC
  Administered 2013-05-24: 1000 mL via INTRAVENOUS

## 2013-05-24 NOTE — ED Notes (Signed)
Presents with bright red blood from rectum that began 4 days ago. He reports constipation. Pt smells of ETOH and reports he drinks everyday. He reports a thumb injury 3 weeks ago, thumb is obviously deformed.
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
## 2013-05-25 NOTE — ED Provider Notes (Signed)
Medical screening examination/treatment/procedure(s) were performed by non-physician practitioner and as supervising physician I was immediately available for consultation/collaboration.  Camelle Henkels R. Menachem Urbanek, MD 05/25/13 2337 

## 2014-09-15 ENCOUNTER — Emergency Department (HOSPITAL_COMMUNITY): Payer: Self-pay

## 2014-09-15 ENCOUNTER — Encounter (HOSPITAL_COMMUNITY): Payer: Self-pay | Admitting: *Deleted

## 2014-09-15 ENCOUNTER — Emergency Department (HOSPITAL_COMMUNITY)
Admission: EM | Admit: 2014-09-15 | Discharge: 2014-09-15 | Disposition: A | Discharge status: Home / Self Care | Payer: Self-pay | Attending: Emergency Medicine | Admitting: Emergency Medicine

## 2014-09-15 DIAGNOSIS — Y998 Other external cause status: Secondary | ICD-10-CM | POA: Insufficient documentation

## 2014-09-15 DIAGNOSIS — Z720 Tobacco use: Secondary | ICD-10-CM | POA: Insufficient documentation

## 2014-09-15 DIAGNOSIS — Z79899 Other long term (current) drug therapy: Secondary | ICD-10-CM | POA: Insufficient documentation

## 2014-09-15 DIAGNOSIS — Y288XXA Contact with other sharp object, undetermined intent, initial encounter: Secondary | ICD-10-CM | POA: Insufficient documentation

## 2014-09-15 DIAGNOSIS — Y9289 Other specified places as the place of occurrence of the external cause: Secondary | ICD-10-CM | POA: Insufficient documentation

## 2014-09-15 DIAGNOSIS — Y9389 Activity, other specified: Secondary | ICD-10-CM | POA: Insufficient documentation

## 2014-09-15 DIAGNOSIS — S61210A Laceration without foreign body of right index finger without damage to nail, initial encounter: Secondary | ICD-10-CM | POA: Insufficient documentation

## 2014-09-15 DIAGNOSIS — Z23 Encounter for immunization: Secondary | ICD-10-CM | POA: Insufficient documentation

## 2014-09-15 DIAGNOSIS — IMO0002 Reserved for concepts with insufficient information to code with codable children: Secondary | ICD-10-CM

## 2014-09-15 MED ORDER — NAPROXEN 500 MG PO TABS: 500.0000 mg | ORAL_TABLET | Freq: Two times a day (BID) | ORAL | Status: DC

## 2014-09-15 MED ORDER — AMOXICILLIN-POT CLAVULANATE 875-125 MG PO TABS
1.0000 | ORAL_TABLET | Freq: Two times a day (BID) | ORAL | Status: DC
Start: 2014-09-15 — End: 2017-04-26

## 2014-09-15 MED ORDER — BACITRACIN ZINC 500 UNIT/GM EX OINT
1.0000 "application " | TOPICAL_OINTMENT | Freq: Two times a day (BID) | CUTANEOUS | Status: DC
  Administered 2014-09-15: 1 via TOPICAL
  Filled 2014-09-15 (×2): qty 0.9

## 2014-09-15 MED ORDER — TETANUS-DIPHTH-ACELL PERTUSSIS 5-2.5-18.5 LF-MCG/0.5 IM SUSP
0.5000 mL | Freq: Once | INTRAMUSCULAR | Status: AC
  Administered 2014-09-15: 0.5 mL via INTRAMUSCULAR
  Filled 2014-09-15: qty 0.5

## 2014-09-15 NOTE — ED Notes (Signed)
Declined W/C at D/C and was escorted to lobby by RN. 

## 2014-09-15 NOTE — ED Notes (Signed)
Pt reports cutting his Rt index finger Friday 09-15-14 2 11AM on a metwl saw.

## 2014-09-15 NOTE — ED Provider Notes (Signed)
CSN: [id number]     Arrival date & time 09/15/14  2130 History  This chart was scribed for Comer Locket, PA-C, working with Charlesetta Shanks, MD by Steva Colder, ED Scribe. The patient was seen in room TR09C/TR09C at 10:32 AM.    Chief Complaint  Patient presents with  . Laceration     The history is provided by the patient. No language interpreter was used.   HPI Comments: [name] is a 53 y.o. male who presents to the Emergency Department complaining of right index finger laceration onset 10:30 AM yesterday morning. He cut his finger on a disc blade that kicked back and cut his finger. He wrapped his finger after the incident occurred. He reports only having taken Tylenol for the pain. He denies loss of sensation, numbness, tingling, discharge, drainage, and any other symptoms. Pt is not UTD on tetanus. Denies being on any anticoagulation. Not immunocompromised. He informs [place] that he takes a goody powder occasionally.    History reviewed. No pertinent past medical history. History reviewed. No pertinent past surgical history. History reviewed. No pertinent family history. History  Substance Use Topics  . Smoking status: Current Every Day Smoker -- 1.00 packs/day  . Smokeless tobacco: Never Used  . Alcohol Use: Yes    Review of Systems  Skin: Positive for wound (lac of the right index finger).       No discharge or drainage from the laceration  Neurological: Negative for numbness.       No loss of sensation  All other systems reviewed and are negative.     Allergies  Review of patient's allergies indicates no known allergies.  Home Medications   Prior to Admission medications   Medication Sig Start Date End Date Taking? Authorizing Provider  amoxicillin-clavulanate (AUGMENTIN) 875-125 MG per tablet Take 1 tablet by mouth every 12 (twelve) hours. 09/15/14   Viona Gilmore Marlet Korte, PA-C  naproxen (NAPROSYN) 500 MG tablet Take 1 tablet (500 mg total) by mouth 2 (two)  times daily. 09/15/14   Viona Gilmore Massie Mees, PA-C  naproxen sodium (ANAPROX) 220 MG tablet Take 440 mg by mouth daily as needed (for pain).    Historical Provider, MD  polyethylene glycol powder (GLYCOLAX/MIRALAX) powder Take 17 g by mouth 2 (two) times daily. Until daily soft stools  OTC 05/24/13   Hannah Muthersbaugh, PA-C   BP 118/72 mmHg  Pulse 73  Temp(Src) 98.6 F (37 C) (Oral)  Resp 16  Ht 5\' 7"  (1.702 m)  Wt 130 lb (58.968 kg)  BMI 20.36 kg/m2  SpO2 100%  Physical Exam  Constitutional: He is oriented to person, place, and time. He appears well-developed and well-nourished. No distress.  HENT:  Head: Normocephalic and atraumatic.  Mouth/Throat: Oropharynx is clear and moist.  Eyes: Conjunctivae and EOM are normal. Pupils are equal, round, and reactive to light. Right eye exhibits no discharge. Left eye exhibits no discharge. No scleral icterus.  Neck: Neck supple. No tracheal deviation present.  Cardiovascular: Normal rate, regular rhythm, normal heart sounds and intact distal pulses.   Pulmonary/Chest: Effort normal and breath sounds normal. No respiratory distress. He has no wheezes. He has no rales.  Abdominal: Soft. There is no tenderness.  Musculoskeletal: Normal range of motion. He exhibits no tenderness.  Neurological: He is alert and oriented to person, place, and time.  Performed all cardinal hand movements without difficulty. Sensation and motor intact 5/5. Able to flex and extend against resistance.   Skin: Skin is warm and  dry. No rash noted.  1.5-2 cm oblique laceration over the PIP joint of right index finger. Hemostasis achieved PTA. DP 2+. No overt erythema or warmth. No evidence of overt joint capsule damage  Psychiatric: He has a normal mood and affect. His behavior is normal.  Nursing note and vitals reviewed.   ED Course  Procedures (including critical care time) DIAGNOSTIC STUDIES: Oxygen Saturation is 100% on room air, normal by my interpretation.     COORDINATION OF CARE: 10:39 AM-Discussed treatment plan which includes topical abx ointment, right index finger X-Ray, and Augmentin Rx with pt at bedside and pt agreed to plan.   Labs Review Labs Reviewed - No data to display  Imaging Review Dg Finger Index Right  09/15/2014   CLINICAL DATA:  Laceration right index finger this morning with metal saw  EXAM: RIGHT INDEX FINGER 2+V  COMPARISON:  None.  FINDINGS: Three views of the right second finger submitted. No acute fracture or subluxation. No radiopaque foreign body. Soft tissue swelling noted dorsally at the level of proximal interphalangeal joint.  IMPRESSION: No acute fracture or subluxation. No radiopaque foreign body. Dorsal soft tissue swelling at the level of proximal interphalangeal joint.   Electronically Signed   By: Lahoma Crocker M.D.   On: 09/15/2014 11:36     EKG Interpretation None      MDM  Vitals stable - WNL -afebrile Pt resting comfortably in ED. Pain managed in ED Laceration has been open for greater than 24 hours. Copious irrigation completed by myself at bedside. Applied topical antibiotic dressing. Also included Augmentin for systemic antibiotic. Patient maintains active range of motion of right index finger. Able to flex and extend against resistance. Neurovascularly intact. Distal pulses intact. No evidence of hemarthrosis or septic joint at this time  Imaging--shows no acute fractures, dislocations, foreign bodies or other osseous abnormalities.  Will DC with naproxen and antibiotics. Finger splint applied. Discussed f/u with PCP and return precautions, pt very amenable to plan. Prior to patient discharge, I discussed and reviewed this case with Dr. Johnney Killian, who also saw and evaluated the patient  Final diagnoses:  Laceration      I personally performed the services described in this documentation, which was scribed in my presence. The recorded information has been reviewed and is accurate.     Viona Gilmore [place], PA-C 09/15/14 1940  Charlesetta Shanks, MD 09/16/14 [phone]

## 2014-09-15 NOTE — Discharge Instructions (Signed)
You were evaluated in the ED today for your laceration. It is important that you take all of your antibiotic even if you begin to feel better. Please return to ED if you experience worsening symptoms, extreme finger pain, redness or swelling to the joint, fevers. Please follow-up with your primary care within the next 3-5 days.

## 2015-03-12 ENCOUNTER — Encounter (HOSPITAL_COMMUNITY): Payer: Self-pay | Admitting: Cardiology

## 2015-03-12 ENCOUNTER — Emergency Department (HOSPITAL_COMMUNITY): Payer: Self-pay

## 2015-03-12 ENCOUNTER — Emergency Department (HOSPITAL_COMMUNITY)
Admission: EM | Admit: 2015-03-12 | Discharge: 2015-03-12 | Disposition: A | Discharge status: Home / Self Care | Payer: Self-pay | Attending: Emergency Medicine | Admitting: Emergency Medicine

## 2015-03-12 DIAGNOSIS — Z720 Tobacco use: Secondary | ICD-10-CM | POA: Insufficient documentation

## 2015-03-12 DIAGNOSIS — J40 Bronchitis, not specified as acute or chronic: Secondary | ICD-10-CM | POA: Insufficient documentation

## 2015-03-12 DIAGNOSIS — Z79899 Other long term (current) drug therapy: Secondary | ICD-10-CM | POA: Insufficient documentation

## 2015-03-12 DIAGNOSIS — Z791 Long term (current) use of non-steroidal anti-inflammatories (NSAID): Secondary | ICD-10-CM | POA: Insufficient documentation

## 2015-03-12 DIAGNOSIS — M549 Dorsalgia, unspecified: Secondary | ICD-10-CM | POA: Insufficient documentation

## 2015-03-12 MED ORDER — ALBUTEROL SULFATE HFA 108 (90 BASE) MCG/ACT IN AERS
2.0000 | INHALATION_SPRAY | RESPIRATORY_TRACT | Status: DC
  Administered 2015-03-12: 2 via RESPIRATORY_TRACT
  Filled 2015-03-12: qty 6.7

## 2015-03-12 MED ORDER — PREDNISONE 10 MG PO TABS: ORAL_TABLET | ORAL | Status: DC

## 2015-03-12 MED ORDER — PREDNISONE 20 MG PO TABS
60.0000 mg | ORAL_TABLET | Freq: Once | ORAL | Status: AC
  Administered 2015-03-12: 60 mg via ORAL
  Filled 2015-03-12: qty 3

## 2015-03-12 NOTE — ED Provider Notes (Signed)
CSN: [id number]     Arrival date & time 03/12/15  1140 History  This chart was scribed for non-physician practitioner Jeannett Senior, PA-C working with Jola Schmidt, MD by Zola Button, ED Scribe. This patient was seen in room TR08C/TR08C and the patient's care was started at 12:56 PM.     Chief Complaint  Patient presents with  . Cough  . Nasal Congestion   The history is provided by the patient. No language interpreter was used.    HPI Comments: [name] is a 54 y.o. male who presents to the Emergency Department complaining of gradual onset productive cough of yellow phlegm that started 4 days ago. Patient also reports having rhinorrhea, congestion, intermittent subjective fever, and chest pain. He woke up this morning with back pain. Patient does have sick contacts at home. He has been taking Mucinex for his symptoms. He has not been using an inhaler. Patient denies sore throat. He does admit to smoking.  Patient is also asking for alcohol detox. Last drink this morning. Denies any active withdrawal symptoms. Not suicidal or homicidal. No history of seizures or DTs.   History reviewed. No pertinent past medical history. History reviewed. No pertinent past surgical history. History reviewed. No pertinent family history. History  Substance Use Topics  . Smoking status: Current Every Day Smoker -- 1.00 packs/day  . Smokeless tobacco: Never Used  . Alcohol Use: Yes    Review of Systems  Constitutional: Positive for fever.  HENT: Positive for congestion and rhinorrhea.   Respiratory: Positive for cough.   Cardiovascular: Positive for chest pain.  Musculoskeletal: Positive for back pain.      Allergies  Review of patient's allergies indicates no known allergies.  Home Medications   Prior to Admission medications   Medication Sig Start Date End Date Taking? Authorizing Provider  amoxicillin-clavulanate (AUGMENTIN) 875-125 MG per tablet Take 1 tablet by mouth every 12  (twelve) hours. 09/15/14   Comer Locket, PA-C  naproxen (NAPROSYN) 500 MG tablet Take 1 tablet (500 mg total) by mouth 2 (two) times daily. 09/15/14   Comer Locket, PA-C  naproxen sodium (ANAPROX) 220 MG tablet Take 440 mg by mouth daily as needed (for pain).    Historical Provider, MD  polyethylene glycol powder (GLYCOLAX/MIRALAX) powder Take 17 g by mouth 2 (two) times daily. Until daily soft stools  OTC 05/24/13   Hannah Muthersbaugh, PA-C   BP 121/75 mmHg  Pulse 88  Temp(Src) 99 F (37.2 C) (Oral)  Resp 18  SpO2 98% Physical Exam  Constitutional: He is oriented to person, place, and time. He appears well-developed and well-nourished. No distress.  HENT:  Head: Normocephalic and atraumatic.  Right Ear: Tympanic membrane, external ear and ear canal normal.  Left Ear: Tympanic membrane, external ear and ear canal normal.  Nose: Nose normal.  Mouth/Throat: Uvula is midline and oropharynx is clear and moist. No oropharyngeal exudate.  Eyes: Pupils are equal, round, and reactive to light.  Neck: Neck supple.  Cardiovascular: Normal rate.   Pulmonary/Chest: Effort normal and breath sounds normal. No respiratory distress. He has no wheezes. He has no rales.  Musculoskeletal: He exhibits no edema.  Neurological: He is alert and oriented to person, place, and time. No cranial nerve deficit.  Skin: Skin is warm and dry. No rash noted.  Psychiatric: He has a normal mood and affect. His behavior is normal.  Nursing note and vitals reviewed.   ED Course  Procedures  DIAGNOSTIC STUDIES: Oxygen Saturation is 98%  on room air, normal by my interpretation.    COORDINATION OF CARE: 12:59 PM-Discussed treatment plan which includes CXR with patient/guardian at bedside and patient/guardian agreed to plan.    Labs Review Labs Reviewed - No data to display  Imaging Review Dg Chest 2 View  03/12/2015   CLINICAL DATA:  Chest pain, initial encounter  EXAM: CHEST  2 VIEW  COMPARISON:   11/30/2004  FINDINGS: Cardiac shadow is within normal limits. The lungs are clear but hyperinflated. No acute bony abnormality is seen. Old rib fractures are again noted on the left.  IMPRESSION: No active cardiopulmonary disease.   Electronically Signed   By: Inez Catalina M.D.   On: 03/12/2015 13:02     EKG Interpretation None      MDM   Final diagnoses:  Bronchitis    Patient with URI symptoms, exam unremarkable. Chest x-ray is negative. Patient is a smoker, will treat his bronchitis. He is afebrile, nontoxic appearing, do not think he needs antibiotics at this time. Will treat with inhaler and prednisone taper. Plan to follow-up with primary care doctor. I also provided him with outpatient resources for detox and treatment programs. At this time no obvious signs of withdrawal, he is comfortable with going home.  Filed Vitals:   03/12/15 1147 03/12/15 1339  BP: 121/75 121/80  Pulse: 88 71  Temp: 99 F (37.2 C) 97.9 F (36.6 C)  TempSrc: Oral Oral  Resp: 18 16  SpO2: 98% 100%     I personally performed the services described in this documentation, which was scribed in my presence. The recorded information has been reviewed and is accurate.   Jeannett Senior, PA-C 03/12/15 [place], MD 03/13/15 [phone]

## 2015-03-12 NOTE — Discharge Instructions (Signed)
Continue over-the-counter cough medications. Use inhaler 2 puffs every 4 hours, for the next 3 days then as needed. Take prednisone as prescribed until all gone starting tomorrow. Follow-up with a primary care doctor for recheck. Return if high fever, shortness of breath, worsening symptoms.   Acute Bronchitis Bronchitis is inflammation of the airways that extend from the windpipe into the lungs (bronchi). The inflammation often causes mucus to develop. This leads to a cough, which is the most common symptom of bronchitis.  In acute bronchitis, the condition usually develops suddenly and goes away over time, usually in a couple weeks. Smoking, allergies, and asthma can make bronchitis worse. Repeated episodes of bronchitis may cause further lung problems.  CAUSES Acute bronchitis is most often caused by the same virus that causes a cold. The virus can spread from person to person (contagious) through coughing, sneezing, and touching contaminated objects. SIGNS AND SYMPTOMS   Cough.   Fever.   Coughing up mucus.   Body aches.   Chest congestion.   Chills.   Shortness of breath.   Sore throat.  DIAGNOSIS  Acute bronchitis is usually diagnosed through a physical exam. Your health care provider will also ask you questions about your medical history. Tests, such as chest X-rays, are sometimes done to rule out other conditions.  TREATMENT  Acute bronchitis usually goes away in a couple weeks. Oftentimes, no medical treatment is necessary. Medicines are sometimes given for relief of fever or cough. Antibiotic medicines are usually not needed but may be prescribed in certain situations. In some cases, an inhaler may be recommended to help reduce shortness of breath and control the cough. A cool mist vaporizer may also be used to help thin bronchial secretions and make it easier to clear the chest.  HOME CARE INSTRUCTIONS  Get plenty of rest.   Drink enough fluids to keep your urine  clear or pale yellow (unless you have a medical condition that requires fluid restriction). Increasing fluids may help thin your respiratory secretions (sputum) and reduce chest congestion, and it will prevent dehydration.   Take medicines only as directed by your health care provider.  If you were prescribed an antibiotic medicine, finish it all even if you start to feel better.  Avoid smoking and secondhand smoke. Exposure to cigarette smoke or irritating chemicals will make bronchitis worse. If you are a smoker, consider using nicotine gum or skin patches to help control withdrawal symptoms. Quitting smoking will help your lungs heal faster.   Reduce the chances of another bout of acute bronchitis by washing your hands frequently, avoiding people with cold symptoms, and trying not to touch your hands to your mouth, nose, or eyes.   Keep all follow-up visits as directed by your health care provider.  SEEK MEDICAL CARE IF: Your symptoms do not improve after 1 week of treatment.  SEEK IMMEDIATE MEDICAL CARE IF:  You develop an increased fever or chills.   You have chest pain.   You have severe shortness of breath.  You have bloody sputum.   You develop dehydration.  You faint or repeatedly feel like you are going to pass out.  You develop repeated vomiting.  You develop a severe headache. MAKE SURE YOU:   Understand these instructions.  Will watch your condition.  Will get help right away if you are not doing well or get worse. Document Released: 11/12/2004 Document Revised: 02/19/2014 Document Reviewed: 03/28/2013 [facility] Patient Information 2015 [place], [place]. This information is not intended to  replace advice given to you by your health care provider. Make sure you discuss any questions you have with your health care provider. ° °

## 2015-03-12 NOTE — ED Notes (Signed)
Pt reports that he has had a cough and congestion for about 4 days. Reports sick contacts at home. Also reports some lower back pain.

## 2015-05-29 ENCOUNTER — Encounter (HOSPITAL_COMMUNITY): Payer: Self-pay | Admitting: Family Medicine

## 2015-05-29 ENCOUNTER — Emergency Department (HOSPITAL_COMMUNITY)
Admission: EM | Admit: 2015-05-29 | Discharge: 2015-05-29 | Disposition: A | Discharge status: Home / Self Care | Payer: Self-pay | Attending: Emergency Medicine | Admitting: Emergency Medicine

## 2015-05-29 DIAGNOSIS — F10129 Alcohol abuse with intoxication, unspecified: Secondary | ICD-10-CM | POA: Insufficient documentation

## 2015-05-29 DIAGNOSIS — Z720 Tobacco use: Secondary | ICD-10-CM | POA: Insufficient documentation

## 2015-05-29 DIAGNOSIS — Z792 Long term (current) use of antibiotics: Secondary | ICD-10-CM | POA: Insufficient documentation

## 2015-05-29 DIAGNOSIS — F10920 Alcohol use, unspecified with intoxication, uncomplicated: Secondary | ICD-10-CM

## 2015-05-29 DIAGNOSIS — F1410 Cocaine abuse, uncomplicated: Secondary | ICD-10-CM | POA: Insufficient documentation

## 2015-05-29 LAB — RAPID URINE DRUG SCREEN, HOSP PERFORMED
AMPHETAMINES: NOT DETECTED
BARBITURATES: NOT DETECTED
Benzodiazepines: NOT DETECTED
Cocaine: POSITIVE — AB
Opiates: NOT DETECTED
TETRAHYDROCANNABINOL: NOT DETECTED

## 2015-05-29 LAB — CBC
HEMATOCRIT: 39.1 % (ref 39.0–52.0)
HEMOGLOBIN: 13.2 g/dL (ref 13.0–17.0)
MCH: 29.7 pg (ref 26.0–34.0)
MCHC: 33.8 g/dL (ref 30.0–36.0)
MCV: 88.1 fL (ref 78.0–100.0)
Platelets: 93 10*3/uL — ABNORMAL LOW (ref 150–400)
RBC: 4.44 MIL/uL (ref 4.22–5.81)
RDW: 14.3 % (ref 11.5–15.5)
WBC: 3.7 10*3/uL — ABNORMAL LOW (ref 4.0–10.5)

## 2015-05-29 LAB — COMPREHENSIVE METABOLIC PANEL
ALBUMIN: 4.5 g/dL (ref 3.5–5.0)
ALK PHOS: 57 U/L (ref 38–126)
ALT: 27 U/L (ref 17–63)
AST: 62 U/L — AB (ref 15–41)
Anion gap: 13 (ref 5–15)
BUN: <5 mg/dL — ABNORMAL LOW (ref 6–20)
CHLORIDE: 103 mmol/L (ref 101–111)
CO2: 24 mmol/L (ref 22–32)
Calcium: 9.3 mg/dL (ref 8.9–10.3)
Creatinine, Ser: 0.72 mg/dL (ref 0.61–1.24)
GFR calc Af Amer: >60 mL/min (ref >60)
Glucose, Bld: 129 mg/dL — ABNORMAL HIGH (ref 65–99)
POTASSIUM: 3.7 mmol/L (ref 3.5–5.1)
SODIUM: 140 mmol/L (ref 135–145)
TOTAL PROTEIN: 7.6 g/dL (ref 6.5–8.1)
Total Bilirubin: 0.6 mg/dL (ref 0.3–1.2)

## 2015-05-29 LAB — ETHANOL
ALCOHOL ETHYL (B): 353 mg/dL — AB (ref <5)
Alcohol, Ethyl (B): 223 mg/dL — ABNORMAL HIGH (ref <5)

## 2015-05-29 NOTE — ED Notes (Signed)
Alcohol level 353 called from lab, dr Sabra Heck aware

## 2015-05-29 NOTE — ED Notes (Signed)
Put side rail of bed up, pt put it back down, currently drinking soda without issue.

## 2015-05-29 NOTE — ED Provider Notes (Signed)
CSN: [id number]     Arrival date & time 05/29/15  1631 History   First MD Initiated Contact with Patient 05/29/15 1731     Chief Complaint  Patient presents with  . Alcohol Problem   HPI   54 year old male presents today seeking alcohol abuse resources. Patient reports he is an alcoholic, drinks everyday, reports that he drinks "several beers" today. He was unable to quantify how many beers he had to drink today, reports he was drinking though. Patient also reports that he abuses drugs, he would not disclose what type of drug sees used. Any complaints including headache, chest pain, shortness of breath, abdominal pain, fevers chills, nausea or vomiting. Patient has no other concerns other than seeking resources for alcohol abuse.  History reviewed. No pertinent past medical history. History reviewed. No pertinent past surgical history. History reviewed. No pertinent family history. Social History  Substance Use Topics  . Smoking status: Current Every Day Smoker -- 1.00 packs/day  . Smokeless tobacco: Never Used  . Alcohol Use: Yes     Comment: "all day" liquor    Review of Systems  All other systems reviewed and are negative.   Allergies  Review of patient's allergies indicates no known allergies.  Home Medications   Prior to Admission medications   Medication Sig Start Date End Date Taking? Authorizing Provider  amoxicillin-clavulanate (AUGMENTIN) 875-125 MG per tablet Take 1 tablet by mouth every 12 (twelve) hours. 09/15/14   Comer Locket, PA-C  naproxen (NAPROSYN) 500 MG tablet Take 1 tablet (500 mg total) by mouth 2 (two) times daily. 09/15/14   Comer Locket, PA-C  polyethylene glycol powder (GLYCOLAX/MIRALAX) powder Take 17 g by mouth 2 (two) times daily. Until daily soft stools  OTC 05/24/13   Hannah Muthersbaugh, PA-C  predniSONE (DELTASONE) 10 MG tablet Take 5 tab day 1, take 4 tab day 2, take 3 tab day 3, take 2 tab day 4, and take 1 tab day 5 03/12/15   Tatyana  Kirichenko, PA-C   BP 119/86 mmHg  Pulse 58  Temp(Src) 98.3 F (36.8 C) (Oral)  Resp 16  SpO2 100%   Physical Exam  Constitutional: He is oriented to person, place, and time. He appears well-developed and well-nourished.  Visibly intoxicated, conscious alert and oriented, carrying on with normal speech and appropriate conversation  HENT:  Head: Normocephalic and atraumatic.  Eyes: Conjunctivae are normal. Pupils are equal, round, and reactive to light. Right eye exhibits no discharge. Left eye exhibits no discharge. No scleral icterus.  Neck: Normal range of motion. No JVD present. No tracheal deviation present.  Cardiovascular: Normal rate, regular rhythm, normal heart sounds and intact distal pulses.   Pulmonary/Chest: Effort normal and breath sounds normal. No stridor.  Abdominal: Soft. He exhibits no distension and no mass. There is no tenderness. There is no rebound and no guarding.  Musculoskeletal: Normal range of motion. He exhibits no edema.  Neurological: He is alert and oriented to person, place, and time. Coordination normal.  Psychiatric: He has a normal mood and affect. His behavior is normal. Judgment and thought content normal.  Nursing note and vitals reviewed.     ED Course  Procedures (including critical care time) Labs Review Labs Reviewed  COMPREHENSIVE METABOLIC PANEL - Abnormal; Notable for the following:    Glucose, Bld 129 (*)    BUN <5 (*)    AST 62 (*)    All other components within normal limits  ETHANOL - Abnormal; Notable for the following:  Alcohol, Ethyl (B) 353 (*)    All other components within normal limits  CBC - Abnormal; Notable for the following:    WBC 3.7 (*)    Platelets 93 (*)    All other components within normal limits  URINE RAPID DRUG SCREEN, HOSP PERFORMED - Abnormal; Notable for the following:    Cocaine POSITIVE (*)    All other components within normal limits  ETHANOL - Abnormal; Notable for the following:    Alcohol,  Ethyl (B) 223 (*)    All other components within normal limits    Imaging Review No results found.   EKG Interpretation None      MDM   Final diagnoses:  Alcohol intoxication, uncomplicated    Labs: Urine rapid drug screen, CMP, ethanol, CBC- significant for ethanol of 353  Imaging:  Consults:  Therapeutics:  Discharge Meds:   Assessment/Plan: Patient presents seeking resources for alcohol abuse. Patient reports he's been seen at [facility] approximately 3 years ago for the same. Basic labs drawn here, significant for ethanol of 353. Repeat ethanol 223, pt was alert and oriented, vomiting normally to questioning, ambulating without difficulty. He was discharged with instructions to follow-up with alcohol abuse resources.          Okey Regal, PA-C 05/30/15 0018  Okey Regal, PA-C 05/30/15 [id number]  Noemi Chapel, MD 05/30/15 [phone]

## 2015-05-29 NOTE — ED Notes (Signed)
Pt verbalizes understanding of d/c instructions and denies any further needs at this time. 

## 2015-05-29 NOTE — Discharge Instructions (Signed)
Alcohol Intoxication Alcohol intoxication occurs when you drink enough alcohol that it affects your ability to function. It can be mild or very severe. Drinking a lot of alcohol in a short time is called binge drinking. This can be very harmful. Drinking alcohol can also be more dangerous if you are taking medicines or other drugs. Some of the effects caused by alcohol may include:  Loss of coordination.  Changes in mood and behavior.  Unclear thinking.  Trouble talking (slurred speech).  Throwing up (vomiting).  Confusion.  Slowed breathing.  Twitching and shaking (seizures).  Loss of consciousness. HOME CARE  Do not drive after drinking alcohol.  Drink enough water and fluids to keep your pee (urine) clear or pale yellow. Avoid caffeine.  Only take medicine as told by your doctor. GET HELP IF:  You throw up (vomit) many times.  You do not feel better after a few days.  You frequently have alcohol intoxication. Your doctor can help decide if you should see a substance use treatment counselor. GET HELP RIGHT AWAY IF:  You become shaky when you stop drinking.  You have twitching and shaking.  You throw up blood. It may look bright red or like coffee grounds.  You notice blood in your poop (bowel movements).  You become lightheaded or pass out (faint). MAKE SURE YOU:   Understand these instructions.  Will watch your condition.  Will get help right away if you are not doing well or get worse. Document Released: 03/23/2008 Document Revised: 06/07/2013 Document Reviewed: 03/10/2013 [facility] Patient Information 2015 [place], [place]. This information is not intended to replace advice given to you by your health care provider. Make sure you discuss any questions you have with your health care provider.  Please follow-up with [facility] for resources with alcohol abuse. Please monitor for new or worsening signs or symptoms return immediately if any present.  Results for  orders placed or performed during the hospital encounter of 05/29/15  Comprehensive metabolic panel  Result Value Ref Range   Sodium 140 135 - 145 mmol/L   Potassium 3.7 3.5 - 5.1 mmol/L   Chloride 103 101 - 111 mmol/L   CO2 24 22 - 32 mmol/L   Glucose, Bld 129 (H) 65 - 99 mg/dL   BUN <5 (L) 6 - 20 mg/dL   Creatinine, Ser 0.72 0.61 - 1.24 mg/dL   Calcium 9.3 8.9 - 10.3 mg/dL   Total Protein 7.6 6.5 - 8.1 g/dL   Albumin 4.5 3.5 - 5.0 g/dL   AST 62 (H) 15 - 41 U/L   ALT 27 17 - 63 U/L   Alkaline Phosphatase 57 38 - 126 U/L   Total Bilirubin 0.6 0.3 - 1.2 mg/dL   GFR calc non Af Amer >60 >60 mL/min   GFR calc Af Amer >60 >60 mL/min   Anion gap 13 5 - 15  Ethanol (ETOH)  Result Value Ref Range   Alcohol, Ethyl (B) 353 (HH) <5 mg/dL  CBC  Result Value Ref Range   WBC 3.7 (L) 4.0 - 10.5 K/uL   RBC 4.44 4.22 - 5.81 MIL/uL   Hemoglobin 13.2 13.0 - 17.0 g/dL   HCT 39.1 39.0 - 52.0 %   MCV 88.1 78.0 - 100.0 fL   MCH 29.7 26.0 - 34.0 pg   MCHC 33.8 30.0 - 36.0 g/dL   RDW 14.3 11.5 - 15.5 %   Platelets 93 (L) 150 - 400 K/uL  Urine rapid drug screen (hosp performed) (Not at  [facility])  Result Value Ref Range   Opiates NONE DETECTED NONE DETECTED   Cocaine POSITIVE (A) NONE DETECTED   Benzodiazepines NONE DETECTED NONE DETECTED   Amphetamines NONE DETECTED NONE DETECTED   Tetrahydrocannabinol NONE DETECTED NONE DETECTED   Barbiturates NONE DETECTED NONE DETECTED  Ethanol  Result Value Ref Range   Alcohol, Ethyl (B) 223 (H) <5 mg/dL   No results found.

## 2015-05-29 NOTE — ED Notes (Signed)
Pt unable to find a ride home at this time.

## 2015-05-29 NOTE — ED Notes (Signed)
Pt here for detox from alcohol. sts that he drinks liquor all day. sts sent here by [facility]. Denies any pain. Denies SI

## 2017-02-05 ENCOUNTER — Emergency Department (HOSPITAL_COMMUNITY)
Admission: EM | Admit: 2017-02-05 | Discharge: 2017-02-05 | Disposition: A | Discharge status: Home / Self Care | Payer: Medicaid Other | Attending: Emergency Medicine | Admitting: Emergency Medicine

## 2017-02-05 ENCOUNTER — Encounter (HOSPITAL_COMMUNITY): Payer: Self-pay | Admitting: Nurse Practitioner

## 2017-02-05 DIAGNOSIS — H109 Unspecified conjunctivitis: Secondary | ICD-10-CM | POA: Insufficient documentation

## 2017-02-05 DIAGNOSIS — B9689 Other specified bacterial agents as the cause of diseases classified elsewhere: Secondary | ICD-10-CM

## 2017-02-05 DIAGNOSIS — F17200 Nicotine dependence, unspecified, uncomplicated: Secondary | ICD-10-CM | POA: Insufficient documentation

## 2017-02-05 DIAGNOSIS — Z79899 Other long term (current) drug therapy: Secondary | ICD-10-CM | POA: Insufficient documentation

## 2017-02-05 DIAGNOSIS — H5713 Ocular pain, bilateral: Secondary | ICD-10-CM | POA: Diagnosis present

## 2017-02-05 MED ORDER — FLUORESCEIN SODIUM 0.6 MG OP STRP: 1.0000 | ORAL_STRIP | Freq: Once | OPHTHALMIC | Status: DC

## 2017-02-05 MED ORDER — ERYTHROMYCIN 5 MG/GM OP OINT
1.0000 "application " | TOPICAL_OINTMENT | Freq: Once | OPHTHALMIC | Status: AC
  Administered 2017-02-05: 1 via OPHTHALMIC
  Filled 2017-02-05: qty 3.5

## 2017-02-05 MED ORDER — TETRACAINE HCL 0.5 % OP SOLN: 2.0000 [drp] | Freq: Once | OPHTHALMIC | Status: DC

## 2017-02-05 NOTE — Discharge Instructions (Signed)
Please return to the emergency department if you develop new symptoms or if your symptoms worsen. You can call [facility] and set up an appointment if symptoms persists. Please apply the erythromycin ointment about a 0.5 inch ribbon to both eyes 4 times per day for the next 5-7 days. Please wash your hands frequently, especially after touching your eye.

## 2017-02-05 NOTE — ED Triage Notes (Signed)
Pt presents with c/o R eye injury. The injury occurred yesterday during construction. He feels something is in his eye. Hes had redness, pain and swelling since.  He tried to flush the eye with water, used visine drops and applied a cold potato to the eye with no relief.

## 2017-02-05 NOTE — ED Provider Notes (Signed)
[facility] DEPT Provider Note   CSN: [id number] Arrival date & time: 02/05/17  1603  By signing my name below, I, Hansel Feinstein, attest that this documentation has been prepared under the direction and in the presence of  Wilba Mutz, PA-C. Electronically Signed: Hansel Feinstein, ED Scribe. 02/05/17. 7:35 PM.    History   Chief Complaint Chief Complaint  Patient presents with  . Eye Pain    HPI [name] is a 56 y.o. male who presents to the Emergency Department complaining of a constant, mildly painful right eye foreign body sensation x 2 days with associated redness, itching and crusting discharge. Pt also states he is beginning to have similar symptoms in the left eye as well. He states he has tried visine with no relief. No h/o of similar symptoms. No known sick contacts with similar symptoms. He denies visual disturbance, CP, back pain, rash, additional complaints.   The history is provided by the patient. No language interpreter was used.    History reviewed. No pertinent past medical history.  There are no active problems to display for this patient.   History reviewed. No pertinent surgical history.     Home Medications    Prior to Admission medications   Medication Sig Start Date End Date Taking? Authorizing Provider  amoxicillin-clavulanate (AUGMENTIN) 875-125 MG per tablet Take 1 tablet by mouth every 12 (twelve) hours. 09/15/14   Comer Locket, PA-C  naproxen (NAPROSYN) 500 MG tablet Take 1 tablet (500 mg total) by mouth 2 (two) times daily. 09/15/14   Comer Locket, PA-C  polyethylene glycol powder (GLYCOLAX/MIRALAX) powder Take 17 g by mouth 2 (two) times daily. Until daily soft stools  OTC 05/24/13   Hannah Muthersbaugh, PA-C  predniSONE (DELTASONE) 10 MG tablet Take 5 tab day 1, take 4 tab day 2, take 3 tab day 3, take 2 tab day 4, and take 1 tab day 5 03/12/15   Jeannett Senior, PA-C    Family History History reviewed. No pertinent family  history.  Social History Social History  Substance Use Topics  . Smoking status: Current Every Day Smoker    Packs/day: 1.00  . Smokeless tobacco: Never Used  . Alcohol use Yes     Comment: "all day" liquor   Allergies   Patient has no known allergies.   Review of Systems Review of Systems  Constitutional: Negative for activity change.  HENT: Negative for congestion.   Eyes: Positive for pain (mild), discharge, redness and itching. Negative for visual disturbance.  Respiratory: Negative for shortness of breath.   Cardiovascular: Negative for chest pain.  Gastrointestinal: Negative for abdominal pain.  Musculoskeletal: Negative for back pain.  Skin: Negative for rash.  Allergic/Immunologic: Negative for immunocompromised state.  Neurological: Negative for dizziness and headaches.  Psychiatric/Behavioral: Negative for confusion.   Physical Exam Updated Vital Signs BP 105/66 (BP Location: Right Arm)   Pulse 62   Temp 98.7 F (37.1 C) (Oral)   Resp 16   SpO2 99%   Physical Exam  Constitutional: He appears well-developed and well-nourished.  HENT:  Head: Normocephalic and atraumatic.  Eyes: EOM are normal. Pupils are equal, round, and reactive to light. Right eye exhibits discharge. No foreign body present in the right eye. Left eye exhibits discharge. No foreign body present in the left eye. Right conjunctiva is injected. Right conjunctiva has no hemorrhage. Left conjunctiva is injected. Left conjunctiva has no hemorrhage.  Neck: Neck supple.  Cardiovascular: Normal rate and regular rhythm.   No  murmur heard. Pulmonary/Chest: Effort normal and breath sounds normal. No respiratory distress. He has no wheezes. He has no rales.  Abdominal: Soft. He exhibits no distension. There is no tenderness. There is no guarding.  Musculoskeletal: He exhibits no edema.  Neurological: He is alert.  Skin: Skin is warm and dry.  Psychiatric: His behavior is normal.  Nursing note and  vitals reviewed.    ED Treatments / Results   DIAGNOSTIC STUDIES: Oxygen Saturation is 97% on RA, normal by my interpretation.    COORDINATION OF CARE: 7:32 PM Discussed treatment plan with pt at bedside which includes ophthalmic antibiotic and pt agreed to plan.   Procedures Procedures (including critical care time)  Medications Ordered in ED Medications  erythromycin ophthalmic ointment 1 application (1 application Both Eyes Given 02/05/17 1949)     Initial Impression / Assessment and Plan / ED Course  I have reviewed the triage vital signs and the nursing notes.      Patient presentation consistent with conjunctivitis.  No evidence of corneal abrasions, entrapment, consensual photophobia, or herpes keratitis.  Presentation not concerning for iritis, or corneal abrasions.  Pt discharged with erythromycin ophthalmic ointment.  Personal hygiene and frequent handwashing discussed.  Patient advised to follow up with ophthalmologist if symptoms persist or worsen. Return precautions discussed.  Patient verbalizes understanding and is agreeable with discharge.   Final Clinical Impressions(s) / ED Diagnoses   Final diagnoses:  Bacterial conjunctivitis of both eyes    New Prescriptions Discharge Medication List as of 02/05/2017  7:44 PM      I personally performed the services described in this documentation, which was scribed in my presence. The recorded information has been reviewed and is accurate.    Joanne Gavel, PA-C 02/07/17 [place], MD 02/08/17 [phone]

## 2017-02-05 NOTE — ED Notes (Signed)
Pt asking how long is the wait  Time given

## 2017-02-05 NOTE — ED Notes (Signed)
Visual acuity Right eye, no glasses 10/20

## 2017-02-05 NOTE — ED Notes (Signed)
The pt has a fb in the rt eye for 2 days.  Painful red irritated  He does carpentry work

## 2017-02-05 NOTE — ED Notes (Signed)
Pt very impatient  He has come to the door looking around for the doctor.  He has been told x 3 that the pa is tied up and will see him soon

## 2017-04-26 ENCOUNTER — Encounter (HOSPITAL_COMMUNITY): Payer: Self-pay | Admitting: Emergency Medicine

## 2017-04-26 ENCOUNTER — Ambulatory Visit (HOSPITAL_COMMUNITY)
Admission: EM | Admit: 2017-04-26 | Discharge: 2017-04-26 | Disposition: A | Discharge status: Home / Self Care | Payer: Medicaid Other | Attending: Internal Medicine | Admitting: Internal Medicine

## 2017-04-26 DIAGNOSIS — J029 Acute pharyngitis, unspecified: Secondary | ICD-10-CM | POA: Diagnosis present

## 2017-04-26 DIAGNOSIS — K122 Cellulitis and abscess of mouth: Secondary | ICD-10-CM

## 2017-04-26 DIAGNOSIS — F17210 Nicotine dependence, cigarettes, uncomplicated: Secondary | ICD-10-CM | POA: Insufficient documentation

## 2017-04-26 LAB — POCT RAPID STREP A: STREPTOCOCCUS, GROUP A SCREEN (DIRECT): NEGATIVE

## 2017-04-26 MED ORDER — NYSTATIN 100000 UNIT/ML MT SUSP: 500000.0000 [IU] | Freq: Four times a day (QID) | OROMUCOSAL | 0 refills | Status: DC

## 2017-04-26 MED ORDER — LIDOCAINE VISCOUS 2 % MT SOLN: 10.0000 mL | OROMUCOSAL | 0 refills | Status: DC | PRN

## 2017-04-26 NOTE — Discharge Instructions (Signed)
Your strep test was negative, you most likely have a fungal infection, uvulitis, I started on nystatin, swish and swallow 4 times a day for 5-7 days. For pain I prescribed viscous lidocaine, swish and swallow 10 mL 3 times a day as needed for pain. If your symptoms persist past one week, return to clinic as needed.

## 2017-04-26 NOTE — ED Triage Notes (Signed)
Pt reports burning in his throat for three weeks. He also reports some nasal congestion, but no fever.

## 2017-04-26 NOTE — ED Provider Notes (Signed)
CSN: [id number]     Arrival date & time 04/26/17  1047 History   First MD Initiated Contact with Patient 04/26/17 1132     Chief Complaint  Patient presents with  . Sore Throat   (Consider location/radiation/quality/duration/timing/severity/associated sxs/prior Treatment) The history is provided by the patient.  Sore Throat  This is a new problem. Episode onset: 3 weeks. The problem occurs constantly. The problem has been gradually worsening. Associated symptoms comments: none. The symptoms are aggravated by swallowing and eating. Nothing relieves the symptoms. The treatment provided no relief.    History reviewed. No pertinent past medical history. History reviewed. No pertinent surgical history. History reviewed. No pertinent family history. Social History  Substance Use Topics  . Smoking status: Current Every Day Smoker    Packs/day: 1.00  . Smokeless tobacco: Never Used  . Alcohol use Yes     Comment: "all day" liquor    Review of Systems  Constitutional: Negative.   HENT: Positive for congestion and sore throat. Negative for rhinorrhea, sinus pain, sinus pressure, trouble swallowing and voice change.   Eyes: Negative.   Respiratory: Negative.   Cardiovascular: Negative.   Gastrointestinal: Negative.   Musculoskeletal: Negative.   Skin: Negative.   Neurological: Negative for light-headedness.  Hematological: Positive for adenopathy.    Allergies  Patient has no known allergies.  Home Medications   Prior to Admission medications   Medication Sig Start Date End Date Taking? Authorizing Provider  lidocaine (XYLOCAINE) 2 % solution Use as directed 10 mLs in the mouth or throat as needed for mouth pain. 04/26/17   Barnet Glasgow, NP  naproxen (NAPROSYN) 500 MG tablet Take 1 tablet (500 mg total) by mouth 2 (two) times daily. 09/15/14   Cartner, Marland Kitchen, PA-C  nystatin (MYCOSTATIN) 100000 UNIT/ML suspension Take 5 mLs (500,000 Units total) by mouth 4 (four) times daily.  Swish and swallow 04/26/17   Barnet Glasgow, NP  polyethylene glycol powder (GLYCOLAX/MIRALAX) powder Take 17 g by mouth 2 (two) times daily. Until daily soft stools  OTC 05/24/13   Muthersbaugh, Jarrett Soho, PA-C   Meds Ordered and Administered this Visit  Medications - No data to display  BP 119/79 (BP Location: Right Arm)   Pulse 79   Temp 98.9 F (37.2 C) (Oral)   SpO2 100%  No data found.   Physical Exam  Constitutional: He is oriented to person, place, and time. He appears well-developed and well-nourished. No distress.  HENT:  Head: Normocephalic.  Right Ear: Tympanic membrane and external ear normal.  Left Ear: External ear normal.  Mouth/Throat: Uvula is midline, oropharynx is clear and moist and mucous membranes are normal.  White patches on the uvula, see photograph  Eyes: Conjunctivae are normal.  Cardiovascular: Normal rate and regular rhythm.   Pulmonary/Chest: Effort normal and breath sounds normal.  Neurological: He is alert and oriented to person, place, and time.  Skin: Skin is warm and dry. Capillary refill takes less than 2 seconds. He is not diaphoretic.  Psychiatric: He has a normal mood and affect. His behavior is normal.  Nursing note and vitals reviewed.     Urgent Care Course     Procedures (including critical care time)  Labs Review Labs Reviewed  POCT RAPID STREP A    Imaging Review No results found.     MDM   1. Uvulitis    Strep test negative, based on signs, symptoms, physical exam findings, believe this is most likely a fungal uvulitis. Treating with nystatin, and  lidocaine. Follow-up with primary care or return to clinic as needed.    Barnet Glasgow, NP 04/26/17 1240

## 2017-04-29 LAB — CULTURE, GROUP A STREP (THRC)

## 2017-05-28 ENCOUNTER — Ambulatory Visit (HOSPITAL_COMMUNITY)
Admission: EM | Admit: 2017-05-28 | Discharge: 2017-05-28 | Disposition: A | Discharge status: Home / Self Care | Payer: Medicaid Other | Attending: Internal Medicine | Admitting: Internal Medicine

## 2017-05-28 ENCOUNTER — Encounter (HOSPITAL_COMMUNITY): Payer: Self-pay

## 2017-05-28 DIAGNOSIS — R05 Cough: Secondary | ICD-10-CM | POA: Diagnosis not present

## 2017-05-28 DIAGNOSIS — Z720 Tobacco use: Secondary | ICD-10-CM | POA: Diagnosis not present

## 2017-05-28 DIAGNOSIS — J020 Streptococcal pharyngitis: Secondary | ICD-10-CM | POA: Diagnosis not present

## 2017-05-28 DIAGNOSIS — J029 Acute pharyngitis, unspecified: Secondary | ICD-10-CM

## 2017-05-28 DIAGNOSIS — F17200 Nicotine dependence, unspecified, uncomplicated: Secondary | ICD-10-CM

## 2017-05-28 DIAGNOSIS — R059 Cough, unspecified: Secondary | ICD-10-CM

## 2017-05-28 LAB — POCT RAPID STREP A: STREPTOCOCCUS, GROUP A SCREEN (DIRECT): POSITIVE — AB

## 2017-05-28 MED ORDER — AMOXICILLIN 875 MG PO TABS: 875.0000 mg | ORAL_TABLET | Freq: Two times a day (BID) | ORAL | 0 refills | Status: DC

## 2017-05-28 NOTE — Discharge Instructions (Signed)
You have strep throat and need to take amoxicillin twice daily with food for 10 days to treat your infection.

## 2017-05-28 NOTE — ED Triage Notes (Signed)
Pt said sore throat for 3 weeks and said it's a burning sensation and does have a low grade temp today. Michela Pitcher he was here for this late time and just got mouthwash. No other symptoms except nasal drainage and raw. Using goody powder without relief.

## 2017-05-28 NOTE — ED Provider Notes (Signed)
    MRN: [id number] DOB: 1961-05-11  Subjective:   [name] is a 56 y.o. male presenting for chief complaint of Sore Throat  Reports 3 week history of persistent throat pain. Has also had persistent cough, subjective fever, runny nose. Has tried Nystatin and lidocaine rinse. He has not had relief with this. Smokes 1ppd.   No current facility-administered medications for this encounter.   Current Outpatient Prescriptions:  .  lidocaine (XYLOCAINE) 2 % solution, Use as directed 10 mLs in the mouth or throat as needed for mouth pain., Disp: 100 mL, Rfl: 0 .  naproxen (NAPROSYN) 500 MG tablet, Take 1 tablet (500 mg total) by mouth 2 (two) times daily., Disp: 30 tablet, Rfl: 0 .  nystatin (MYCOSTATIN) 100000 UNIT/ML suspension, Take 5 mLs (500,000 Units total) by mouth 4 (four) times daily. Swish and swallow, Disp: 60 mL, Rfl: 0 .  polyethylene glycol powder (GLYCOLAX/MIRALAX) powder, Take 17 g by mouth 2 (two) times daily. Until daily soft stools  OTC, Disp: 255 g, Rfl: 0  Also has No Known Allergies.  Curly denies past medical and surgical history.   Objective:   Vitals: BP 115/77 (BP Location: Left Arm)   Pulse 84   Temp 99 F (37.2 C) (Oral)   Resp 18   SpO2 99%   Physical Exam  Constitutional: He is oriented to person, place, and time. He appears well-developed and well-nourished.  HENT:  Tonsils with erythema and exudates bilaterally.  Eyes: Right eye exhibits no discharge. Left eye exhibits no discharge.  Neck: Normal range of motion. Neck supple.  Cardiovascular: Normal rate, regular rhythm and intact distal pulses.  Exam reveals no gallop and no friction rub.   No murmur heard. Pulmonary/Chest: No respiratory distress. He has no wheezes. He has no rales.  Lymphadenopathy:    He has cervical adenopathy (left sided).  Neurological: He is alert and oriented to person, place, and time.  Skin: Skin is warm and dry.   Results for orders placed or performed during the  hospital encounter of 05/28/17 (from the past 24 hour(s))  POCT rapid strep A [facility])     Status: Abnormal   Collection Time: 05/28/17 11:18 AM  Result Value Ref Range   Streptococcus, Group A Screen (Direct) POSITIVE (A) NEGATIVE   Assessment and Plan :   1. Pharyngitis due to Streptococcus species   2. Streptococcal sore throat   3. Cough   4. Tobacco use disorder    Will start patient on amoxicillin BID for 10 days.   Jaynee Eagles, PA-C [facility] at [place] [phone] 05/28/2017  11:22 AM    Jaynee Eagles, PA-C 05/28/17 1136

## 2017-06-24 ENCOUNTER — Encounter (HOSPITAL_COMMUNITY): Payer: Self-pay | Admitting: Emergency Medicine

## 2017-06-24 ENCOUNTER — Ambulatory Visit (HOSPITAL_COMMUNITY)
Admission: EM | Admit: 2017-06-24 | Discharge: 2017-06-24 | Disposition: A | Discharge status: Home / Self Care | Payer: Medicaid Other | Attending: Internal Medicine | Admitting: Internal Medicine

## 2017-06-24 DIAGNOSIS — K122 Cellulitis and abscess of mouth: Secondary | ICD-10-CM | POA: Insufficient documentation

## 2017-06-24 DIAGNOSIS — F17210 Nicotine dependence, cigarettes, uncomplicated: Secondary | ICD-10-CM | POA: Diagnosis not present

## 2017-06-24 DIAGNOSIS — K1321 Leukoplakia of oral mucosa, including tongue: Secondary | ICD-10-CM | POA: Diagnosis not present

## 2017-06-24 DIAGNOSIS — J029 Acute pharyngitis, unspecified: Secondary | ICD-10-CM | POA: Diagnosis present

## 2017-06-24 LAB — CBC WITH DIFFERENTIAL/PLATELET
Basophils Absolute: 0 10*3/uL (ref 0.0–0.1)
Basophils Relative: 1 %
Eosinophils Absolute: 0.2 10*3/uL (ref 0.0–0.7)
Eosinophils Relative: 5 %
HEMATOCRIT: 37.1 % — AB (ref 39.0–52.0)
Hemoglobin: 12.2 g/dL — ABNORMAL LOW (ref 13.0–17.0)
LYMPHS ABS: 1.8 10*3/uL (ref 0.7–4.0)
LYMPHS PCT: 41 %
MCH: 28.4 pg (ref 26.0–34.0)
MCHC: 32.9 g/dL (ref 30.0–36.0)
MCV: 86.5 fL (ref 78.0–100.0)
MONO ABS: 0.5 10*3/uL (ref 0.1–1.0)
MONOS PCT: 11 %
NEUTROS ABS: 1.9 10*3/uL (ref 1.7–7.7)
Neutrophils Relative %: 43 %
Platelets: 100 10*3/uL — ABNORMAL LOW (ref 150–400)
RBC: 4.29 MIL/uL (ref 4.22–5.81)
RDW: 14 % (ref 11.5–15.5)
WBC: 4.4 10*3/uL (ref 4.0–10.5)

## 2017-06-24 MED ORDER — IBUPROFEN 800 MG PO TABS: 800.0000 mg | ORAL_TABLET | Freq: Three times a day (TID) | ORAL | 0 refills | Status: DC

## 2017-06-24 MED ORDER — MAGIC MOUTHWASH W/LIDOCAINE: 5.0000 mL | Freq: Three times a day (TID) | ORAL | 0 refills | Status: DC | PRN

## 2017-06-24 MED ORDER — CLINDAMYCIN HCL 300 MG PO CAPS: 300.0000 mg | ORAL_CAPSULE | Freq: Three times a day (TID) | ORAL | 0 refills | Status: DC

## 2017-06-24 MED ORDER — HYDROCODONE-ACETAMINOPHEN 5-325 MG PO TABS: 1.0000 | ORAL_TABLET | ORAL | 0 refills | Status: DC | PRN

## 2017-06-24 NOTE — ED Provider Notes (Signed)
[facility]   [id number] 06/24/17 Arrival Time: 1505   SUBJECTIVE:  [name] is a 55 y.o. male who presents to the urgent care with complaint of sore throat. This is a recurrent problem, and this is his third visit to the clinic for evaluation. The first visit he was diagnosed with uvulitis, started on magic mouthwash. Second is diagnosed with strep and started on amoxicillin. He has had no relief in any of his symptoms. Upon history, he does drink alcohol and he does smoke, he reports that he often picks up cigarette butts off of the ground to finish them because he cannot afford cigarettes. Is also engauged in unprotected oral intercourse with one male partner. He's had no fevers or chills, he does have swelling along his neck, and pain in the back of his throat. No known history of HIV, syphilis, or any condition that would reduce his immune system.     History reviewed. No pertinent past medical history. No family history on file. Social History   Social History  . Marital status: Married    Spouse name: N/A  . Number of children: N/A  . Years of education: N/A   Occupational History  . Not on file.   Social History Main Topics  . Smoking status: Current Every Day Smoker    Packs/day: 1.00  . Smokeless tobacco: Never Used  . Alcohol use Yes     Comment: "all day" liquor  . Drug use: No     Comment: denies  . Sexual activity: Not on file   Other Topics Concern  . Not on file   Social History Narrative  . No narrative on file   No outpatient prescriptions have been marked as taking for the 06/24/17 encounter [facility] Encounter).   No Known Allergies    ROS: As per HPI, remainder of ROS negative.   OBJECTIVE:   Vitals:   06/24/17 1542  BP: 103/63  Pulse: 80  Resp: 18  Temp: 98.9 F (37.2 C)  TempSrc: Oral  SpO2: 98%     General appearance: alert; no distress Eyes: PERRL; EOMI; conjunctiva normal HENT: normocephalic; atraumatic;  Leukoplakic lesions noted posterior oropharynx, tonsils +2 with erythema, does have tender tonsillar lymphadenopathy, and cervical lymphadenopathy. These leukoplakic lesions do not scrub off with pressure Neck: supple, cervical lymphadenopathy Lungs: clear to auscultation bilaterally Heart: regular rate and rhythm Abdomen: soft, non-tender; bowel sounds normal; no masses or organomegaly; no guarding or rebound tenderness Back: no CVA tenderness Extremities: no cyanosis or edema; symmetrical with no gross deformities Skin: warm and dry Neurologic: normal gait; grossly normal Psychological: alert and cooperative; normal mood and affect        Labs:  Results for orders placed or performed during the hospital encounter of 06/24/17  CBC with Differential  Result Value Ref Range   WBC 4.4 4.0 - 10.5 K/uL   RBC 4.29 4.22 - 5.81 MIL/uL   Hemoglobin 12.2 (L) 13.0 - 17.0 g/dL   HCT 37.1 (L) 39.0 - 52.0 %   MCV 86.5 78.0 - 100.0 fL   MCH 28.4 26.0 - 34.0 pg   MCHC 32.9 30.0 - 36.0 g/dL   RDW 14.0 11.5 - 15.5 %   Platelets PENDING 150 - 400 K/uL   Neutrophils Relative % 43 %   Neutro Abs 1.9 1.7 - 7.7 K/uL   Lymphocytes Relative 41 %   Lymphs Abs 1.8 0.7 - 4.0 K/uL   Monocytes Relative 11 %   Monocytes  Absolute 0.5 0.1 - 1.0 K/uL   Eosinophils Relative 5 %   Eosinophils Absolute 0.2 0.0 - 0.7 K/uL   Basophils Relative 1 %   Basophils Absolute 0.0 0.0 - 0.1 K/uL    Labs Reviewed  CBC WITH DIFFERENTIAL/PLATELET - Abnormal; Notable for the following:       Result Value   Hemoglobin 12.2 (*)    HCT 37.1 (*)    All other components within normal limits  HSV CULTURE AND TYPING  HIV ANTIBODY (ROUTINE TESTING)  RPR  CYTOLOGY, (ORAL, ANAL, URETHRAL) ANCILLARY ONLY    No results found.     ASSESSMENT & PLAN:  1. Uvulitis   2. Leukoplakia of palate     Meds ordered this encounter  Medications  . clindamycin (CLEOCIN) 300 MG capsule    Sig: Take 1 capsule (300 mg total)  by mouth 3 (three) times daily.    Dispense:  21 capsule    Refill:  0    Order Specific Question:   Supervising Provider    Answer:   Sherlene Shams [[id number]]  . ibuprofen (ADVIL,MOTRIN) 800 MG tablet    Sig: Take 1 tablet (800 mg total) by mouth 3 (three) times daily.    Dispense:  21 tablet    Refill:  0    Order Specific Question:   Supervising Provider    Answer:   Sherlene Shams [[id number]]  . magic mouthwash w/lidocaine SOLN    Sig: Take 5 mLs by mouth 3 (three) times daily as needed for mouth pain.    Dispense:  100 mL    Refill:  0    1 Part Lidocaine, 1 Part Nystatin, 1 Part Maalox, 1 Part Benadryl    Order Specific Question:   Supervising Provider    Answer:   Sherlene Shams [[id number]]  . HYDROcodone-acetaminophen (NORCO/VICODIN) 5-325 MG tablet    Sig: Take 1 tablet by mouth every 4 (four) hours as needed.    Dispense:  10 tablet    Refill:  0    Order Specific Question:   Supervising Provider    Answer:   Sherlene Shams [[id number]]   Drawn blood for HIV, RPR, also checking CBC with differential, doing HSV testing and oral cytology for gonorrhea, and chlamydia. A paper order requisition was done to do fungal cultures and for general throat culture and the samples were sent to lab. Giving short course of hydrocodone for pain, along with Magic mouthwash, and started on Clindamycin empirically  Strongly encouraged to follow up with ear nose and throat for further testing and evaluation due to possibility of malignancy. Importance was stressed that he needs to follow up with ENT, for further evaluation.  Reviewed expectations re: course of current medical issues. Questions answered. Outlined signs and symptoms indicating need for more acute intervention. Patient verbalized understanding. After Visit Summary given.    Procedures:        Barnet Glasgow, NP 06/24/17 1835

## 2017-06-24 NOTE — ED Triage Notes (Addendum)
Patient has had recurrent sore throats over the past few months.  Symptoms go away and then return.  Pain os on left side of throat.  Seen 7/9, 8/10 and today

## 2017-06-24 NOTE — Discharge Instructions (Signed)
For your condition, I have prescribed clindamycin, one tablet 3 times a day. This is a powerful antibiotic. I have also prescribed Magic mouthwash with lidocaine, this will not your throat, and also has an antifungal medicine in it as well. We have tested you today from multiple different types of infections. If there are any significant findings, we will contact you with the results.  I have given you the contact information for ear nose and throat specialist. It is important that you contact this doctor and schedule an appointment for reevaluation soon. As  lesion has spread, it is important to follow up with this surgeon to ensure a malignancy can't be ruled out.

## 2017-06-25 LAB — CYTOLOGY, (ORAL, ANAL, URETHRAL) ANCILLARY ONLY
Candida vaginitis: NEGATIVE
Chlamydia: NEGATIVE
NEISSERIA GONORRHEA: NEGATIVE

## 2017-06-25 LAB — RPR: RPR Ser Ql: NONREACTIVE

## 2017-06-25 LAB — HIV ANTIBODY (ROUTINE TESTING W REFLEX): HIV SCREEN 4TH GENERATION: NONREACTIVE

## 2017-06-27 LAB — CULTURE, GROUP A STREP (THRC)

## 2017-06-28 LAB — HSV CULTURE AND TYPING

## 2017-07-07 ENCOUNTER — Other Ambulatory Visit: Payer: Self-pay | Admitting: Otolaryngology

## 2017-07-07 DIAGNOSIS — J358 Other chronic diseases of tonsils and adenoids: Secondary | ICD-10-CM

## 2017-07-07 DIAGNOSIS — R220 Localized swelling, mass and lump, head: Principal | ICD-10-CM

## 2017-07-16 ENCOUNTER — Other Ambulatory Visit: Payer: Self-pay | Admitting: Otolaryngology

## 2017-07-22 ENCOUNTER — Encounter: Payer: Self-pay | Admitting: Radiation Oncology

## 2017-07-26 ENCOUNTER — Telehealth: Payer: Self-pay | Admitting: *Deleted

## 2017-07-26 NOTE — Telephone Encounter (Signed)
Spoke with ENT Dr. Janace Hoard' medical assistant, Romie Levee, requested he order staging PET.  She voiced understanding.  Gayleen Orem, RN, BSN, [place] [facility] Oncology Nurse [place] at [place] [phone]

## 2017-07-27 ENCOUNTER — Ambulatory Visit
Admission: RE | Admit: 2017-07-27 | Discharge: 2017-07-27 | Disposition: A | Discharge status: Home / Self Care | Payer: Medicaid Other | Source: Ambulatory Visit | Attending: Otolaryngology | Admitting: Otolaryngology

## 2017-07-27 DIAGNOSIS — R220 Localized swelling, mass and lump, head: Principal | ICD-10-CM

## 2017-07-27 DIAGNOSIS — J358 Other chronic diseases of tonsils and adenoids: Secondary | ICD-10-CM

## 2017-07-27 MED ORDER — IOPAMIDOL (ISOVUE-300) INJECTION 61%
75.0000 mL | Freq: Once | INTRAVENOUS | Status: AC | PRN
Start: 2017-07-27 — End: 2017-07-27
  Administered 2017-07-27: 75 mL via INTRAVENOUS

## 2017-07-27 NOTE — Progress Notes (Signed)
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  Patient presented months ago with symptoms of: He presented to the Emergency Room in July and August complaining of a sore throat.   Biopsies of Tonsil revealed: Squamous cell carcinoma   Nutrition Status Yes No Comments  Weight changes? [x]  []  He reports a 5 lb weight loss since September  Swallowing concerns? [x]  []  He has trouble swallowing due to throat irritation. He is eating softer foods. He was drinking up to 5 ensure daily, until it became too expensive.   PEG? []  [x]     Referrals Yes No Comments  Social Work? []  [x]    Dentistry? [x]  []  Dr. Enrique Sack 08/03/17  Swallowing therapy? []  [x]    Nutrition? []  [x]    Med/Onc? []  [x]     Safety Issues Yes No Comments  Prior radiation? []  [x]    Pacemaker/ICD? []  [x]    Possible current pregnancy? []  [x]    Is the patient on methotrexate? []  [x]     Tobacco/Marijuana/Snuff/ETOH use: Yes, he is a current smoker at about one pack daily. He does drink one pint daily of alcohol.   Past/Anticipated interventions by otolaryngology, if any:  Dr. Janace Hoard 07/16/17 Direct Laryngoscopy and biopsy.  Past/Anticipated interventions by medical oncology, if any:    Current Complaints / other details:    BP 113/88   Pulse 65   Temp 99.7 F (37.6 C)   Ht 5\' 7"  (1.702 m)   Wt 127 lb 9.6 oz (57.9 kg)   SpO2 100% Comment: room air  BMI 19.98 kg/m    Wt Readings from Last 3 Encounters:  08/03/17 127 lb 9.6 oz (57.9 kg)  09/15/14 130 lb (59 kg)   No chief complaint on file.

## 2017-07-28 LAB — FUNGUS CULTURE RESULT

## 2017-07-28 LAB — FUNGUS CULTURE WITH STAIN

## 2017-07-28 LAB — FUNGAL ORGANISM REFLEX

## 2017-07-30 ENCOUNTER — Telehealth: Payer: Self-pay | Admitting: *Deleted

## 2017-07-30 NOTE — Telephone Encounter (Signed)
Oncology Nurse Navigator Documentation  Placed introductory call to new referral patient.  Introduced myself as the H&N oncology nurse navigator that works with Dr. Isidore Moos to whom he has been referred by Dr. Janace Hoard.  He was not aware of referral, I explained process.  Informed him of 10/16 1030 appt with Dr. Isidore Moos.  Briefly explained my role as his navigator, indicated that I would be joining him during his appt next week.  Confirmed his understanding of [place] location, explained arrival and RadOnc registration process for appt.  Answered his questions re treatment/prognosis.  Explained purpose of pre-radiotherapy dental evaluation, indicated he will be contacted by [facility] to schedule appt.  Provided my contact information, encouraged him to call with questions/concerns before next week.  He requested I call him Monday to remind him of appt.  He verbalized understanding of information provided, expressed appreciation for my call.  Gayleen Orem, RN, BSN, [place] [facility] Oncology Nurse [place] at [place] [phone]

## 2017-08-02 ENCOUNTER — Telehealth: Payer: Self-pay | Admitting: *Deleted

## 2017-08-02 NOTE — Telephone Encounter (Signed)
Oncology Nurse Navigator Documentation  Called patient to remind him of tomorrow's 10:30 appt to see Dr. Isidore Moos, encouraged 10:15 arrival to register.  Confirmed his understanding of same day 1:00 appt with Dental Medicine.  He voiced understanding of information.  Gayleen Orem, RN, BSN, [place] [facility] Oncology Nurse [place] at [place] [phone]

## 2017-08-03 ENCOUNTER — Encounter: Payer: Self-pay | Admitting: Hematology and Oncology

## 2017-08-03 ENCOUNTER — Encounter (HOSPITAL_COMMUNITY): Payer: Self-pay | Admitting: Dentistry

## 2017-08-03 ENCOUNTER — Ambulatory Visit (HOSPITAL_COMMUNITY): Payer: Self-pay | Admitting: Dentistry

## 2017-08-03 ENCOUNTER — Ambulatory Visit
Admission: RE | Admit: 2017-08-03 | Discharge: 2017-08-03 | Disposition: A | Discharge status: Home / Self Care | Payer: Medicaid Other | Source: Ambulatory Visit | Attending: Radiation Oncology | Admitting: Radiation Oncology

## 2017-08-03 ENCOUNTER — Encounter: Payer: Self-pay | Admitting: Radiation Oncology

## 2017-08-03 ENCOUNTER — Encounter: Payer: Self-pay | Admitting: *Deleted

## 2017-08-03 VITALS — BP 110/71 | HR 61 | Temp 98.5°F

## 2017-08-03 VITALS — BP 113/88 | HR 65 | Temp 99.7°F | Ht 67.0 in | Wt 127.6 lb

## 2017-08-03 DIAGNOSIS — K08409 Partial loss of teeth, unspecified cause, unspecified class: Secondary | ICD-10-CM

## 2017-08-03 DIAGNOSIS — M270 Developmental disorders of jaws: Secondary | ICD-10-CM

## 2017-08-03 DIAGNOSIS — C090 Malignant neoplasm of tonsillar fossa: Secondary | ICD-10-CM | POA: Insufficient documentation

## 2017-08-03 DIAGNOSIS — K0889 Other specified disorders of teeth and supporting structures: Secondary | ICD-10-CM | POA: Insufficient documentation

## 2017-08-03 DIAGNOSIS — R1310 Dysphagia, unspecified: Secondary | ICD-10-CM | POA: Insufficient documentation

## 2017-08-03 DIAGNOSIS — K083 Retained dental root: Secondary | ICD-10-CM

## 2017-08-03 DIAGNOSIS — K06020 Generalized gingival recession, unspecified: Secondary | ICD-10-CM

## 2017-08-03 DIAGNOSIS — K036 Deposits [accretions] on teeth: Secondary | ICD-10-CM

## 2017-08-03 DIAGNOSIS — K0530 Chronic periodontitis, unspecified: Secondary | ICD-10-CM

## 2017-08-03 DIAGNOSIS — C099 Malignant neoplasm of tonsil, unspecified: Secondary | ICD-10-CM

## 2017-08-03 DIAGNOSIS — M264 Malocclusion, unspecified: Secondary | ICD-10-CM

## 2017-08-03 DIAGNOSIS — F1010 Alcohol abuse, uncomplicated: Secondary | ICD-10-CM | POA: Diagnosis not present

## 2017-08-03 DIAGNOSIS — Z01818 Encounter for other preprocedural examination: Secondary | ICD-10-CM

## 2017-08-03 DIAGNOSIS — K029 Dental caries, unspecified: Secondary | ICD-10-CM

## 2017-08-03 DIAGNOSIS — K06010 Localized gingival recession, unspecified: Secondary | ICD-10-CM

## 2017-08-03 DIAGNOSIS — K045 Chronic apical periodontitis: Secondary | ICD-10-CM

## 2017-08-03 DIAGNOSIS — Z510 Encounter for antineoplastic radiation therapy: Secondary | ICD-10-CM | POA: Insufficient documentation

## 2017-08-03 DIAGNOSIS — F17210 Nicotine dependence, cigarettes, uncomplicated: Secondary | ICD-10-CM | POA: Insufficient documentation

## 2017-08-03 NOTE — Progress Notes (Addendum)
Oncology Nurse Navigator Documentation  Met with patient during initial consult with Dr. Isidore Moos.  He was accompanied by his sister Rise Paganini.   1. Further introduced myself as his Navigator, explained my role as a member of the Care Team.   2. Provided New Patient Information packet, discussed contents:  Contact information for physician(s), myself, other members of the Care Team.  Advance Directive information ([place] blue pamphlet with LCSW contact info).  He was provided [facility] AD form.  Fall Prevention Patient Safety Plan  Appointment Guideline  Financial Assistance Information sheet  [place] with [facility] [place]  SLP Information  3. Provided introductory explanation of radiation treatment including SIM planning and purpose of Aquaplast head and shoulder mask, showed them example.   4. I contacted Nutrition for assistance with supplement. 5. Provided information/discussed opportunities for smoking cessation support:    Child psychotherapist class/individual counseling at [facility] [facility] information 6. Provided  tour of SIM and treatment areas, explained registration, arrival procedures. 7. I showed them location of [place] for this afternoon's appt. 8. I encouraged them to contact me with questions/concerns as treatments/procedures begin.  They verbalized understanding of information provided.    Gayleen Orem, RN, BSN, [place] [facility] Oncology Nurse [place] at [address] [phone]

## 2017-08-03 NOTE — Progress Notes (Addendum)
.  Radiation Oncology         [phone]) [phone] ________________________________  Initial outpatient Consultation  Name: [name] MRN: [id number]  Date: 08/03/2017  DOB: 19-Mar-1961  CC:[facility]  Melissa Montane, MD   REFERRING PHYSICIAN: Melissa Montane, MD  DIAGNOSIS:    ICD-10-CM   1. 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 male who presented with ongoing sore throat onset around the middle of June. Initially, the patient presented to urgent care on 04/26/17 complaining of a persistent sore throat which began approximately three weeks prior. Secondary to this he reports that he experienced a bulbous sensation to the throat. Initially, he was diagnosed with uvulitis as strep testing was negative. He was seen twice more at urgent care, once diagnosed with strep treated with amoxicillin and again treated for uvulitis. Each visit to urgent care was approximately one month apart from his initial visit. None of the remedies proposed by urgent care improved his initial symptoms. On his last visit with urgent care ENT referral was given.   Subsequently, the patient saw Dr. Janace Hoard of [facility] on 06/30/17 who thought this was likely palate and tonsil carcinoma. He underwent laryngoscopy on 07/16/17 with biopsy which revealed squamous cell carcinoma.    Pertinent imaging thus far includes CT neck soft tissues performed on 07/27/17 revealing indistinct tonsillar enlargement on the left in the region of the tonsillar mass biopsy. This was thought to be a combination of post-biopsy edema or residual mass. Margins were not discrete and accurate measurement could not be performed. It was estimated to be about 3cm in size. No nodes were notable for enlargement or malignant involvement.    Following this, he was referred into radiation oncology to discuss the potential role of radiotherapy in the ongoing management of his disease.   Swallowing issues, if any: painful to swallow, otherwise no problems   Weight Changes: He has overall been maintaining his weight, +/- a 5lb weight loss.   Pain status: painful to swallow  Other symptoms: bulbous sensation to the throat  Tobacco history, if any: He reports that he has smoked ~1ppd x 30 years. He voices his desire to cut back on this.   ETOH abuse, if any: He reports that he drinks about 1 beer and 1 pint of gin a day. He voices his desire to cut back on this. Additionally, his wife notes that he has attended several rehab programs in the past.   Prior cancers, if any: none  PREVIOUS RADIATION THERAPY: No  PAST MEDICAL HISTORY:  None reported other than this issue.  PAST SURGICAL HISTORY: none reported  FAMILY HISTORY: family history is not on file.  SOCIAL HISTORY:  reports that he has been smoking.  He has a 30.00 pack-year smoking history. He has never used smokeless tobacco. He reports that he drinks about 4.2 oz of alcohol per week . He reports that he does not use drugs. He currently lives in [place]. In his free time he likes to ride his bicycle which he states that he rides approximately 20 miles a day.   ALLERGIES: Patient has no known allergies.  MEDICATIONS:  Current Outpatient Prescriptions  Medication Sig Dispense Refill  . acetaminophen (TYLENOL) 325 MG tablet Take 650 mg by mouth every 6 (  six) hours as needed for moderate pain.      No current facility-administered medications for this encounter.     REVIEW OF SYSTEMS:  A 10+ POINT REVIEW OF SYSTEMS WAS OBTAINED including neurology, dermatology, psychiatry, cardiac, respiratory, lymph, extremities, GI, GU, Musculoskeletal, constitutional, HEENT.  All pertinent positives are noted in the HPI.  All others are negative.   PHYSICAL EXAM:  height is 5'  7" (1.702 m) and weight is 127 lb 9.6 oz (57.9 kg). His temperature is 99.7 F (37.6 C). His blood pressure is 113/88 and his pulse is 65. His oxygen saturation is 100%.   General: Alert and oriented, in no acute distress HEENT: Head is normocephalic. Extraocular movements are intact. He is missing several teeth. Oropharynx is notable for speckled, white tumor that appears to extend from the left palate through the uvula, crossing midline. Pt has a brisk gag reflex, it appears the left tonsil is involved.  Estimate tumor size - 4.5cm total Neck: No palpable masses are appreciated within the cervical, submandibular, or supraclavicular region.  Heart: Regular in rate and rhythm with no murmurs, rubs, or gallops.  Chest: Clear to auscultation bilaterally, with no rhonchi, wheezes, or rales.  Abdomen: Soft, nontender, nondistended, with no rigidity or guarding. Extremities: No cyanosis or edema. Lymphatics: see Neck Exam Skin: No concerning lesions. Musculoskeletal: symmetric strength and muscle tone throughout.  Neurologic: Cranial nerves II through XII are grossly intact. No obvious focalities. Speech is fluent. Coordination is intact. Psychiatric: Judgment and insight are intact. Affect is appropriate.  ECOG = 1  LABORATORY DATA:  Lab Results  Component Value Date   WBC 4.4 06/24/2017   HGB 12.2 (L) 06/24/2017   HCT 37.1 (L) 06/24/2017   MCV 86.5 06/24/2017   PLT 100 (L) 06/24/2017   CMP     Component Value Date/Time   NA 140 05/29/2015 1648   K 3.7 05/29/2015 1648   CL 103 05/29/2015 1648   CO2 24 05/29/2015 1648   GLUCOSE 129 (H) 05/29/2015 1648   BUN <5 (L) 05/29/2015 1648   CREATININE 0.72 05/29/2015 1648   CALCIUM 9.3 05/29/2015 1648   PROT 7.6 05/29/2015 1648   ALBUMIN 4.5 05/29/2015 1648   AST 62 (H) 05/29/2015 1648   ALT 27 05/29/2015 1648   ALKPHOS 57 05/29/2015 1648   BILITOT 0.6 05/29/2015 1648   GFRNONAA >60 05/29/2015 1648   GFRAA >60 05/29/2015 1648           RADIOGRAPHY: Ct Soft Tissue Neck W Contrast  Result Date: 07/27/2017 CLINICAL DATA:  Right neck lipoma. Tonsillar cancer on the left with tonsil biopsy last week. EXAM: CT NECK WITH CONTRAST TECHNIQUE: Multidetector CT imaging of the neck was performed using the standard protocol following the bolus administration of intravenous contrast. CONTRAST:  84mL ISOVUE-300 IOPAMIDOL (ISOVUE-300) INJECTION 61% COMPARISON:  08/07/2006 FINDINGS: Pharynx and larynx: Edematous changes noted in the left tonsillar region. There is slight asymmetric tissue left to right. This could be a combination of post biopsy edema and tumor. Distinct margins cannot be established. Size estimation is 3 cm, based on coronal imaging measurements. No other mucosal or submucosal lesion is identified. Salivary glands: Submandibular and parotid glands are normal. Thyroid: Normal Lymph nodes: No enlarged or low-density nodes on either side of the neck. Vascular: Arterial and venous structures are patent. No carotid bifurcation calcification. Limited intracranial: Normal Visualized orbits: Normal Mastoids and visualized paranasal sinuses: Mild sinus mucosal thickening. Skeleton: Ordinary mid cervical spondylosis. Upper chest: Emphysema.  No focal lesion. 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 This is present posterolateral to the jugular vein and posterior to the sternocleidomastoid muscle. There is complete ossification of the stylohyoid ligaments bilaterally. IMPRESSION: Indistinct tonsillar enlargement on the left in the region of left tonsillar mass biopsy. This could be a combination of post biopsy edema and residual mass. Margins are not discrete and accurate measurement cannot be performed. This is estimated at about 3 cm in size. No evidence of metastatic adenopathy. Right neck lipoma as described above. Calcified stylohyoid  ligaments. Electronically Signed   By: Nelson Chimes M.D.   On: 07/27/2017 13:38      IMPRESSION/PLAN: DUTCH ING is a 56 y.o. male who presents today to discuss ongoing management of his squamous cell carcinoma of the left tonsil.   This is a delightful patient with head and neck cancer.  We discussed the potential risks, benefits, and side effects of radiotherapy. We talked in detail about acute and late effects. We discussed that some of the most bothersome acute effects may be mucositis, dysgeusia, salivary changes, skin irritation, hair loss, dehydration, weight loss and fatigue. We talked about late effects which include but are not necessarily limited to dysphagia, hypothyroidism, nerve injury, spinal cord injury, xerostomia, trismus, and neck edema. No guarantees of treatment were given. A consent form was signed and placed in the patient's medical record. The patient is enthusiastic about proceeding with treatment. I look forward to participating in the patient's care.      Additionally, I informed the patient that I would like to obtain a PET/CT to evaluate his disease further. I also would like to refer him to Dr Lebron Conners after obtaining this to discuss the role of systemic therapy if needed. We will also order a PET to evaluate for  disease within the neck, throat, and body. CT poorly defined this tumor which does not enhance well - PET will be integral to RT planning. They are willing to travel to [facility] for this if needed . Most recent CT of the neck was performed on 07/27/17, but on my exam today his tumor seems more extensive than originally thought.   He will be discussed tomorrow at our head and neck tumor board. If ENTsurgery believes that he would be a good candidate for TORS surgical resection we will make this referral. After we perform more imaging and he is cleared by dentistry we will perform simulation scanning.   Smoking cessation instruction/counseling given: counseled  patient on the dangers of tobacco use, advised patient to stop smoking, and reviewed strategies to maximize success.    Alcohol cessation instructions/counseling was also given.   We also discussed that the treatment of head and neck cancer is a multidisciplinary process to maximize treatment outcomes and quality of life. For this reasons the following referrals have been or will be made:  Medical oncology to discuss chemotherapy if his PET scan warrants  Dentistry for dental evaluation, possible extractions in the radiation fields, and /or advice on reducing risk of cavities, osteoradionecrosis, or other oral issues.  Nutritionist for nutrition support during and after treatment.  Speech language pathology for swallowing and/or speech therapy.  Social work for social support.   Physical therapy due to risk of lymphedema in neck and deconditioning.  Baseline labs including TSH. __________________________________________   Eppie Gibson, MD  This document serves as a record  of services personally performed by Eppie Gibson, MD. It was created on his behalf by Reola Mosher, a trained medical scribe. The creation of this record is based on the scribe's personal observations and the provider's statements to them. This document has been checked and approved by the attending provider.

## 2017-08-03 NOTE — Patient Instructions (Signed)

## 2017-08-03 NOTE — Progress Notes (Signed)
DENTAL CONSULTATION  Date of Consultation:  08/03/2017 Patient Name:   [name] Date of Birth:   [birth year] Medical Record Number: [id number]  VITALS: BP 110/71 (BP Location: Left Arm)   Pulse 61   Temp 98.5 F (36.9 C) (Oral)   CHIEF COMPLAINT: Patient referred by Dr. Isidore Moos for a dental consultation.   HPI: [name] is a 56 year old male recently diagnosed with squamous cell carcinoma of the left tonsil. Patient with anticipated radiation therapy and possible chemotherapy. Patient also being considered for TORS procedure.  Patient is now seen as part of a medically necessary pre-chemoradiation therapy dental protocol examination rule out dental infection that may affect the patient's systemic health and to prevent future complications such as osteoradionecrosis.  The patient currently denies acute toothaches, swellings, or abscesses. Patient has not seen a dentist in over 40 years by his report. Patient denies having any partial dentures. 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   PROBLEM LIST: Patient Active Problem List   Diagnosis Date Noted  . Squamous cell carcinoma of left tonsil ([place]) 08/03/2017    Priority: High    PMH: History reviewed. No pertinent past medical history.  PSH: History reviewed. No pertinent surgical history.  ALLERGIES: No Known Allergies  MEDICATIONS: Current Outpatient Prescriptions  Medication Sig Dispense Refill  . acetaminophen (TYLENOL) 325 MG tablet Take 650 mg by mouth every 6 (six) hours as needed.    Marland Kitchen ibuprofen (ADVIL,MOTRIN) 800 MG tablet Take 1 tablet (800 mg total) by mouth 3 (three) times daily. 21 tablet 0  . HYDROcodone-acetaminophen (NORCO/VICODIN) 5-325 MG tablet Take 1 tablet by mouth every 4 (four) hours as needed. (Patient not taking: Reported on 08/03/2017) 10 tablet 0  . magic mouthwash w/lidocaine SOLN Take 5 mLs by mouth 3 (three) times daily as needed for mouth pain. (Patient not taking:  Reported on 08/03/2017) 100 mL 0   No current facility-administered medications for this visit.      LABS: Lab Results  Component Value Date   WBC 4.4 06/24/2017   HGB 12.2 (L) 06/24/2017   HCT 37.1 (L) 06/24/2017   MCV 86.5 06/24/2017   PLT 100 (L) 06/24/2017      Component Value Date/Time   NA 140 05/29/2015 1648   K 3.7 05/29/2015 1648   CL 103 05/29/2015 1648   CO2 24 05/29/2015 1648   GLUCOSE 129 (H) 05/29/2015 1648   BUN <5 (L) 05/29/2015 1648   CREATININE 0.72 05/29/2015 1648   CALCIUM 9.3 05/29/2015 1648   GFRNONAA >60 05/29/2015 1648   GFRAA >60 05/29/2015 1648   Lab Results  Component Value Date   INR 0.99 05/24/2013   INR 1.0 02/19/2009   No results found for: PTT  SOCIAL HISTORY: Social History   Social History  . Marital status: Single    Spouse name: N/A  . Number of children: 1  . Years of education: N/A   Occupational History  . Disabled    Social History Main Topics  . Smoking status: Current Every Day Smoker    Packs/day: 1.00    Years: 30.00  . Smokeless tobacco: Never Used  . Alcohol use 4.2 oz/week    7 Cans of beer per week     Comment: 1 pint daily   . Drug use: No     Comment: he has a past history of cocaine abuse, he denies current use  . Sexual activity: Not on file   Other Topics Concern  .  Not on file   Social History Narrative  . No narrative on file    FAMILY HISTORY: History reviewed. No pertinent family history.  REVIEW OF SYSTEMS: Reviewed with the patient as per History of present illness. Psych: Patient does have a history of dental phobia.   DENTAL HISTORY: CHIEF COMPLAINT: Patient referred by Dr. Isidore Moos for a dental consultation.   HPI: [name] is a 56 year old male recently diagnosed with squamous cell carcinoma of the left tonsil. Patient with anticipated radiation therapy and possible chemotherapy. Patient also being considered for TORS procedure.  Patient is now seen as part of a medically  necessary pre-chemoradiation therapy dental protocol examination rule out dental infection that may affect the patient's systemic health and to prevent future complications such as osteoradionecrosis.  The patient currently denies acute toothaches, swellings, or abscesses. Patient has not seen a dentist in over 40 years by his report. Patient denies having any partial dentures. 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  DENTAL EXAMINATION: GENERAL:Patient is a well-developed, slightly built male in no acute distress. HEAD AND NECK: There is no palpable neck lymphadenopathy. The patient denies acute TMJ symptoms. INTRAORAL EXAM: The patient has normal saliva. The left soft palate, uvula, and left tonsil has a mass consistent with cancer diagnosis. The patient has bilateral mandibular lingual tori. DENTITION: Patient is missing multiple teeth numbers 7, 8, 9, 10, 18, 19, 20, 23, 24, 25, and 31. There are retained root segments in the area of tooth numbers 2, 3, 4, 5, 14, and 15. PERIODONTAL:  The patient has chronic periodontitis with plaque and calculus accumulations, generalized gingival recession, and generalized tooth mobility as per dental charting form. There is moderate to severe bone loss. DENTAL CARIES/SUBOPTIMAL RESTORATIONS: Multiple dental caries are noted. ENDODONTIC: Patient currently denies acute pulpitis symptoms. There are multiple areas of periapical pathology and radiolucency. CROWN AND BRIDGE: There are no crown or bridge restorations. PROSTHODONTIC: Patient denies having partial dentures. OCCLUSION: Patient has a poor occlusal scheme secondary to multiple missing teeth, multiple retained root segments, supra-eruption and drifting of the unopposed teeth into the edentulous areas, and lack of replacement of missing teeth with dental prostheses.  RADIOGRAPHIC INTERPRETATION: A suboptimal orthopantogram was taken and supplemented with 14 periapical radiographs. There  are multiple missing teeth. There are multiple retained root segments. There are multiple areas of periapical pathology and radiolucency. Dental caries are noted. There is supra-eruption and drifting of the unopposed teeth into the edentulous areas. There are mandibular radiopacities consistent with bilateral mandibular lingual tori.  ASSESSMENTS: 1. Squamous cell carcinoma of the left tonsil 2. Pre-chemoradiation therapy dental protocol 3. Chronic apical periodontitis 4. Multiple retained root segments 5. Dental caries 6. Chronic periodontitis with bone loss 7. Generalized gingival recession 8. Gingival recession 9. Tooth mobility 10. Multiple missing teeth 11. Supra-eruption and drifting of the unopposed teeth into the edentulous areas 12. No history of partial dentures 13. Poor occlusal scheme and malocclusion 14. History of thrombocytopenia with the risk for bleeding with invasive dental procedures   PLAN/RECOMMENDATIONS: 1. I discussed the risks, benefits, and complications of various treatment options with the patient in relationship to his medical and dental conditions, anticipated radiation therapy and possible chemotherapy, and risk for complications to include xerostomia, radiation caries, trismus, mucositis, taste changes, gum and jawbone changes, and risk for infection and osteoradionecrosis. We discussed various treatment options to include no treatment, multiple extractions with alveoloplasty, pre-prosthetic surgery as indicated, periodontal therapy, dental restorations, root canal therapy,  crown and bridge therapy, implant therapy, and replacement of missing teeth as indicated. The patient currently wishes to proceed with extraction of remaining teeth with alveoloplasty and pre-prosthetic surgery as needed in the operating room with general anesthesia. This has been scheduled for Monday, 08/09/2017 at 7:30 AM at [facility]. The patient will then follow-up with a dentist  of his choice for fabrication of upper and lower complete dentures approximately 3 months after the last radiation therapy has been given. Patient will be cleared for start of chemoradiation therapy approximately 2 weeks after the date of the extractions barring any complications.  2. Discussion of findings with medical team and coordination of future medical and dental care as needed.  I spent in excess of 120 minutes during the conduct of this consultation and >50% of this time involved direct face-to-face encounter for counseling and/or coordination of the patient's care.    Lenn Cal, DDS

## 2017-08-04 ENCOUNTER — Other Ambulatory Visit: Payer: Self-pay | Admitting: Radiation Oncology

## 2017-08-04 ENCOUNTER — Encounter: Payer: Self-pay | Admitting: Nutrition

## 2017-08-04 DIAGNOSIS — C090 Malignant neoplasm of tonsillar fossa: Secondary | ICD-10-CM

## 2017-08-04 NOTE — Progress Notes (Signed)
Patient needs assistance with [employer]. I will provide one complimentary case for patient on Friday, Oct 19. Patient is scheduled to attend [facility] Neck Clinic.

## 2017-08-06 ENCOUNTER — Other Ambulatory Visit: Payer: Self-pay | Admitting: Radiation Oncology

## 2017-08-06 ENCOUNTER — Encounter (HOSPITAL_COMMUNITY): Payer: Self-pay

## 2017-08-06 ENCOUNTER — Encounter (HOSPITAL_COMMUNITY)
Admission: RE | Admit: 2017-08-06 | Discharge: 2017-08-06 | Disposition: A | Discharge status: Home / Self Care | Payer: Medicaid Other | Source: Ambulatory Visit | Attending: Radiation Oncology | Admitting: Radiation Oncology

## 2017-08-06 ENCOUNTER — Ambulatory Visit (HOSPITAL_BASED_OUTPATIENT_CLINIC_OR_DEPARTMENT_OTHER): Payer: Medicaid Other | Admitting: Hematology and Oncology

## 2017-08-06 ENCOUNTER — Telehealth: Payer: Self-pay | Admitting: Hematology and Oncology

## 2017-08-06 ENCOUNTER — Encounter: Payer: Self-pay | Admitting: Hematology and Oncology

## 2017-08-06 VITALS — BP 120/76 | HR 59 | Temp 99.6°F | Resp 18 | Ht 67.0 in | Wt 135.3 lb

## 2017-08-06 DIAGNOSIS — Z720 Tobacco use: Secondary | ICD-10-CM

## 2017-08-06 DIAGNOSIS — C099 Malignant neoplasm of tonsil, unspecified: Secondary | ICD-10-CM | POA: Insufficient documentation

## 2017-08-06 DIAGNOSIS — F10230 Alcohol dependence with withdrawal, uncomplicated: Secondary | ICD-10-CM

## 2017-08-06 DIAGNOSIS — Z01812 Encounter for preprocedural laboratory examination: Secondary | ICD-10-CM | POA: Insufficient documentation

## 2017-08-06 DIAGNOSIS — F10930 Alcohol use, unspecified with withdrawal, uncomplicated: Secondary | ICD-10-CM

## 2017-08-06 HISTORY — DX: Malignant (primary) neoplasm, unspecified: C80.1

## 2017-08-06 LAB — BASIC METABOLIC PANEL
ANION GAP: 10 (ref 5–15)
BUN: 11 mg/dL (ref 6–20)
CO2: 27 mmol/L (ref 22–32)
Calcium: 9.3 mg/dL (ref 8.9–10.3)
Chloride: 100 mmol/L — ABNORMAL LOW (ref 101–111)
Creatinine, Ser: 0.56 mg/dL — ABNORMAL LOW (ref 0.61–1.24)
Glucose, Bld: 91 mg/dL (ref 65–99)
POTASSIUM: 3.7 mmol/L (ref 3.5–5.1)
SODIUM: 137 mmol/L (ref 135–145)

## 2017-08-06 LAB — CBC
HEMATOCRIT: 37 % — AB (ref 39.0–52.0)
HEMOGLOBIN: 12.3 g/dL — AB (ref 13.0–17.0)
MCH: 28.9 pg (ref 26.0–34.0)
MCHC: 33.2 g/dL (ref 30.0–36.0)
MCV: 87.1 fL (ref 78.0–100.0)
Platelets: 107 10*3/uL — ABNORMAL LOW (ref 150–400)
RBC: 4.25 MIL/uL (ref 4.22–5.81)
RDW: 14.2 % (ref 11.5–15.5)
WBC: 3.9 10*3/uL — AB (ref 4.0–10.5)

## 2017-08-06 MED ORDER — OXYCODONE HCL 10 MG PO TABS: 10.0000 mg | ORAL_TABLET | ORAL | 0 refills | Status: DC | PRN

## 2017-08-06 MED ORDER — NICOTINE POLACRILEX 4 MG MT LOZG: 4.0000 mg | LOZENGE | OROMUCOSAL | 0 refills | Status: DC | PRN

## 2017-08-06 MED ORDER — NICOTINE 21 MG/24HR TD PT24: 21.0000 mg | MEDICATED_PATCH | Freq: Every day | TRANSDERMAL | 0 refills | Status: AC

## 2017-08-06 MED ORDER — LORAZEPAM 1 MG PO TABS: 1.0000 mg | ORAL_TABLET | Freq: Two times a day (BID) | ORAL | 0 refills | Status: AC

## 2017-08-06 NOTE — Progress Notes (Signed)
Spoke with Dr. Royce Macadamia about patient needing Anesthesia consult for his surgery on 08/09/2017. Per Dr. Royce Macadamia , Anesthesia will assess him morning of surgery.

## 2017-08-06 NOTE — Patient Instructions (Addendum)
[name]  08/06/2017   Your procedure is scheduled on: Monday 08/09/2017   Report to [facility] Main  Entrance             Take [place]  elevators to 3rd floor to  [place] at   [place]  AM.    Call this number if you have problems the morning of surgery [phone] and tell them to page Dr. Kathrene Bongo and tell them your problem   Remember: ONLY 1 PERSON MAY GO WITH YOU TO SHORT STAY TO GET  READY MORNING OF YOUR SURGERY.   Do not eat food or drink liquids :After Midnight.     Take these medicines the morning of surgery with A SIP OF WATER: Ativan, Oxycodone if needed                                 You may not have any metal on your body including hair pins and              piercings  Do not wear jewelry, make-up, lotions, powders or perfumes, deodorant             Do not wear nail polish.  Do not shave  48 hours prior to surgery.              Men may shave face and neck.   Do not bring valuables to the hospital. [place].  Contacts, dentures or bridgework may not be worn into surgery.  Leave suitcase in the car. After surgery it may be brought to your room.     Patients discharged the day of surgery will not be allowed to drive home.  Name and phone number of your driver: Sister-in law or Brother-in law  Special Instructions: N/A              Please read over the following fact sheets you were given: _____________________________________________________________________             [facility] - Preparing for Surgery Before surgery, you can play an important role.  Because skin is not sterile, your skin needs to be as free of germs as possible.  You can reduce the number of germs on your skin by washing with CHG (chlorahexidine gluconate) soap before surgery.  CHG is an antiseptic cleaner which kills germs and bonds with the skin to continue killing germs even after washing. Please DO NOT  use if you have an allergy to CHG or antibacterial soaps.  If your skin becomes reddened/irritated stop using the CHG and inform your nurse when you arrive at Short Stay. Do not shave (including legs and underarms) for at least 48 hours prior to the first CHG shower.  You may shave your face/neck. Please follow these instructions carefully:  1.  Shower with CHG Soap the night before surgery and the  morning of Surgery.  2.  If you choose to wash your hair, wash your hair first as usual with your  normal  shampoo.  3.  After you shampoo, rinse your hair and body thoroughly to remove the  shampoo.  4.  Use CHG as you would any other liquid soap.  You can apply chg directly  to the skin and wash                       Gently with a scrungie or clean washcloth.  5.  Apply the CHG Soap to your body ONLY FROM THE NECK DOWN.   Do not use on face/ open                           Wound or open sores. Avoid contact with eyes, ears mouth and genitals (private parts).                       Wash face,  Genitals (private parts) with your normal soap.             6.  Wash thoroughly, paying special attention to the area where your surgery  will be performed.  7.  Thoroughly rinse your body with warm water from the neck down.  8.  DO NOT shower/wash with your normal soap after using and rinsing off  the CHG Soap.                9.  Pat yourself dry with a clean towel.            10.  Wear clean pajamas.            11.  Place clean sheets on your bed the night of your first shower and do not  sleep with pets. Day of Surgery : Do not apply any lotions/deodorants the morning of surgery.  Please wear clean clothes to the hospital/surgery center.  FAILURE TO FOLLOW THESE INSTRUCTIONS MAY RESULT IN THE CANCELLATION OF YOUR SURGERY PATIENT SIGNATURE_________________________________  NURSE  SIGNATURE__________________________________  ________________________________________________________________________

## 2017-08-06 NOTE — Telephone Encounter (Signed)
Scheduled appt per 10/19 los - sent reminder letter in the mail and left message on voicemail.

## 2017-08-09 ENCOUNTER — Ambulatory Visit (HOSPITAL_COMMUNITY)
Admission: RE | Admit: 2017-08-09 | Discharge: 2017-08-09 | Disposition: A | Discharge status: Home / Self Care | Payer: Medicaid Other | Source: Ambulatory Visit | Attending: Dentistry | Admitting: Dentistry

## 2017-08-09 ENCOUNTER — Ambulatory Visit (HOSPITAL_COMMUNITY): Payer: Medicaid Other | Admitting: Anesthesiology

## 2017-08-09 ENCOUNTER — Encounter (HOSPITAL_COMMUNITY)
Admission: RE | Disposition: A | Discharge status: Home / Self Care | Payer: Self-pay | Source: Ambulatory Visit | Attending: Dentistry

## 2017-08-09 ENCOUNTER — Encounter (HOSPITAL_COMMUNITY): Payer: Self-pay | Admitting: Anesthesiology

## 2017-08-09 DIAGNOSIS — K0889 Other specified disorders of teeth and supporting structures: Secondary | ICD-10-CM

## 2017-08-09 DIAGNOSIS — F17200 Nicotine dependence, unspecified, uncomplicated: Secondary | ICD-10-CM | POA: Insufficient documentation

## 2017-08-09 DIAGNOSIS — C099 Malignant neoplasm of tonsil, unspecified: Secondary | ICD-10-CM | POA: Diagnosis not present

## 2017-08-09 DIAGNOSIS — K029 Dental caries, unspecified: Secondary | ICD-10-CM

## 2017-08-09 DIAGNOSIS — K045 Chronic apical periodontitis: Secondary | ICD-10-CM | POA: Diagnosis not present

## 2017-08-09 DIAGNOSIS — K083 Retained dental root: Secondary | ICD-10-CM

## 2017-08-09 DIAGNOSIS — K0530 Chronic periodontitis, unspecified: Secondary | ICD-10-CM

## 2017-08-09 DIAGNOSIS — M270 Developmental disorders of jaws: Secondary | ICD-10-CM

## 2017-08-09 HISTORY — PX: MULTIPLE EXTRACTIONS WITH ALVEOLOPLASTY: SHX5342

## 2017-08-09 SURGERY — MULTIPLE EXTRACTION WITH ALVEOLOPLASTY
Anesthesia: General

## 2017-08-09 MED ORDER — LACTATED RINGERS IV SOLN: INTRAVENOUS | Status: DC

## 2017-08-09 MED ORDER — CEFAZOLIN SODIUM-DEXTROSE 2-4 GM/100ML-% IV SOLN
2.0000 g | INTRAVENOUS | Status: AC
  Administered 2017-08-09: 2 g via INTRAVENOUS

## 2017-08-09 MED ORDER — HYDROMORPHONE HCL 1 MG/ML IJ SOLN
INTRAMUSCULAR | Status: AC
  Filled 2017-08-09: qty 1

## 2017-08-09 MED ORDER — DEXAMETHASONE SODIUM PHOSPHATE 10 MG/ML IJ SOLN
INTRAMUSCULAR | Status: AC
  Filled 2017-08-09: qty 1

## 2017-08-09 MED ORDER — LIDOCAINE 2% (20 MG/ML) 5 ML SYRINGE
INTRAMUSCULAR | Status: AC
  Filled 2017-08-09: qty 5

## 2017-08-09 MED ORDER — CEFAZOLIN SODIUM-DEXTROSE 2-4 GM/100ML-% IV SOLN
INTRAVENOUS | Status: AC
  Filled 2017-08-09: qty 100

## 2017-08-09 MED ORDER — PROPOFOL 10 MG/ML IV BOLUS
INTRAVENOUS | Status: AC
  Filled 2017-08-09: qty 40

## 2017-08-09 MED ORDER — PROPOFOL 10 MG/ML IV BOLUS
INTRAVENOUS | Status: DC | PRN
  Administered 2017-08-09: 50 mg via INTRAVENOUS
  Administered 2017-08-09: 150 mg via INTRAVENOUS

## 2017-08-09 MED ORDER — MEPERIDINE HCL 50 MG/ML IJ SOLN: 6.2500 mg | INTRAMUSCULAR | Status: DC | PRN

## 2017-08-09 MED ORDER — FENTANYL CITRATE (PF) 100 MCG/2ML IJ SOLN
INTRAMUSCULAR | Status: AC
  Filled 2017-08-09: qty 2

## 2017-08-09 MED ORDER — ROCURONIUM BROMIDE 50 MG/5ML IV SOSY
PREFILLED_SYRINGE | INTRAVENOUS | Status: AC
  Filled 2017-08-09: qty 5

## 2017-08-09 MED ORDER — LIDOCAINE-EPINEPHRINE 2 %-1:100000 IJ SOLN
INTRAMUSCULAR | Status: AC
  Filled 2017-08-09: qty 6.8

## 2017-08-09 MED ORDER — OXYCODONE HCL 5 MG PO TABS: 5.0000 mg | ORAL_TABLET | ORAL | Status: DC | PRN

## 2017-08-09 MED ORDER — MIDAZOLAM HCL 5 MG/5ML IJ SOLN
INTRAMUSCULAR | Status: DC | PRN
  Administered 2017-08-09: 2 mg via INTRAVENOUS

## 2017-08-09 MED ORDER — ONDANSETRON HCL 4 MG/2ML IJ SOLN
INTRAMUSCULAR | Status: DC | PRN
  Administered 2017-08-09: 4 mg via INTRAVENOUS

## 2017-08-09 MED ORDER — ONDANSETRON HCL 4 MG/2ML IJ SOLN
INTRAMUSCULAR | Status: AC
  Filled 2017-08-09: qty 2

## 2017-08-09 MED ORDER — LIDOCAINE-EPINEPHRINE 2 %-1:100000 IJ SOLN
INTRAMUSCULAR | Status: DC | PRN
  Administered 2017-08-09: 10.2 mL via INTRADERMAL

## 2017-08-09 MED ORDER — ONDANSETRON HCL 4 MG/2ML IJ SOLN: 4.0000 mg | Freq: Once | INTRAMUSCULAR | Status: DC | PRN

## 2017-08-09 MED ORDER — DEXAMETHASONE SODIUM PHOSPHATE 10 MG/ML IJ SOLN
INTRAMUSCULAR | Status: DC | PRN
  Administered 2017-08-09: 10 mg via INTRAVENOUS

## 2017-08-09 MED ORDER — EPHEDRINE 5 MG/ML INJ
INTRAVENOUS | Status: AC
  Filled 2017-08-09: qty 20

## 2017-08-09 MED ORDER — BUPIVACAINE-EPINEPHRINE (PF) 0.5% -1:200000 IJ SOLN
INTRAMUSCULAR | Status: DC | PRN
  Administered 2017-08-09: 2.6 mL via PERINEURAL

## 2017-08-09 MED ORDER — SUGAMMADEX SODIUM 200 MG/2ML IV SOLN
INTRAVENOUS | Status: DC | PRN
  Administered 2017-08-09: 124.2 mg via INTRAVENOUS

## 2017-08-09 MED ORDER — FENTANYL CITRATE (PF) 100 MCG/2ML IJ SOLN
INTRAMUSCULAR | Status: DC | PRN
  Administered 2017-08-09: 25 ug via INTRAVENOUS
  Administered 2017-08-09: 50 ug via INTRAVENOUS
  Administered 2017-08-09: 25 ug via INTRAVENOUS
  Administered 2017-08-09 (×2): 50 ug via INTRAVENOUS

## 2017-08-09 MED ORDER — EPHEDRINE SULFATE-NACL 50-0.9 MG/10ML-% IV SOSY
PREFILLED_SYRINGE | INTRAVENOUS | Status: DC | PRN
  Administered 2017-08-09 (×2): 5 mg via INTRAVENOUS

## 2017-08-09 MED ORDER — HYDROMORPHONE HCL 1 MG/ML IJ SOLN
0.2500 mg | INTRAMUSCULAR | Status: DC | PRN
  Administered 2017-08-09 (×2): 0.5 mg via INTRAVENOUS

## 2017-08-09 MED ORDER — OXYMETAZOLINE HCL 0.05 % NA SOLN
NASAL | Status: AC
  Filled 2017-08-09: qty 15

## 2017-08-09 MED ORDER — MIDAZOLAM HCL 2 MG/2ML IJ SOLN
INTRAMUSCULAR | Status: AC
  Filled 2017-08-09: qty 2

## 2017-08-09 MED ORDER — SUGAMMADEX SODIUM 200 MG/2ML IV SOLN
INTRAVENOUS | Status: AC
  Filled 2017-08-09: qty 2

## 2017-08-09 MED ORDER — ISOPROPYL ALCOHOL 70 % SOLN
Status: DC | PRN
  Administered 2017-08-09: 1 via TOPICAL

## 2017-08-09 MED ORDER — LACTATED RINGERS IV SOLN
INTRAVENOUS | Status: DC | PRN
  Administered 2017-08-09: 06:00:00 via INTRAVENOUS

## 2017-08-09 MED ORDER — AMINOCAPROIC ACID SOLUTION 5% (50 MG/ML)
10.0000 mL | ORAL | Status: DC
  Filled 2017-08-09: qty 100

## 2017-08-09 MED ORDER — PHENYLEPHRINE 40 MCG/ML (10ML) SYRINGE FOR IV PUSH (FOR BLOOD PRESSURE SUPPORT)
PREFILLED_SYRINGE | INTRAVENOUS | Status: AC
  Filled 2017-08-09: qty 10

## 2017-08-09 MED ORDER — SUCCINYLCHOLINE CHLORIDE 200 MG/10ML IV SOSY
PREFILLED_SYRINGE | INTRAVENOUS | Status: AC
  Filled 2017-08-09: qty 10

## 2017-08-09 MED ORDER — BUPIVACAINE-EPINEPHRINE (PF) 0.5% -1:200000 IJ SOLN
INTRAMUSCULAR | Status: AC
  Filled 2017-08-09: qty 3.6

## 2017-08-09 MED ORDER — LIDOCAINE 2% (20 MG/ML) 5 ML SYRINGE
INTRAMUSCULAR | Status: DC | PRN
  Administered 2017-08-09: 100 mg via INTRAVENOUS

## 2017-08-09 MED ORDER — ROCURONIUM BROMIDE 10 MG/ML (PF) SYRINGE
PREFILLED_SYRINGE | INTRAVENOUS | Status: DC | PRN
  Administered 2017-08-09: 50 mg via INTRAVENOUS

## 2017-08-09 SURGICAL SUPPLY — 35 items
ATTRACTOMAT 16X20 MAGNETIC DRP (DRAPES) ×3 IMPLANT
BAG SPEC THK2 15X12 ZIP CLS (MISCELLANEOUS)
BAG ZIPLOCK 12X15 (MISCELLANEOUS) IMPLANT
BANDAGE EYE OVAL (MISCELLANEOUS) ×6 IMPLANT
BLADE SURG 15 STRL LF DISP TIS (BLADE) ×2 IMPLANT
BLADE SURG 15 STRL SS (BLADE) ×6
CANNULA VESSEL W/WING WO/VALVE (CANNULA) ×3 IMPLANT
COVER SURGICAL LIGHT HANDLE (MISCELLANEOUS) ×3 IMPLANT
GAUZE SPONGE 4X4 12PLY STRL (GAUZE/BANDAGES/DRESSINGS) ×3 IMPLANT
GAUZE SPONGE 4X4 16PLY XRAY LF (GAUZE/BANDAGES/DRESSINGS) ×5 IMPLANT
GLOVE BIOGEL PI IND STRL 6 (GLOVE) ×1 IMPLANT
GLOVE BIOGEL PI INDICATOR 6 (GLOVE) ×2
GLOVE SURG ORTHO 8.0 STRL STRW (GLOVE) ×3 IMPLANT
GLOVE SURG SS PI 6.0 STRL IVOR (GLOVE) ×3 IMPLANT
GOWN STRL REUS W/TWL 2XL LVL3 (GOWN DISPOSABLE) ×3 IMPLANT
GOWN STRL REUS W/TWL LRG LVL3 (GOWN DISPOSABLE) ×3 IMPLANT
KIT BASIN OR (CUSTOM PROCEDURE TRAY) ×3 IMPLANT
NDL DENTAL RB 25GX1.25 (NEEDLE) IMPLANT
NEEDLE DENTAL RB 25GX1.25 (NEEDLE) IMPLANT
NS IRRIG 1000ML POUR BTL (IV SOLUTION) ×3 IMPLANT
PACK EENT SPLIT (PACKS) ×3 IMPLANT
PACKING VAGINAL (PACKING) ×3 IMPLANT
SPONGE SURGIFOAM ABS GEL 100 (HEMOSTASIS) ×2 IMPLANT
SPONGE SURGIFOAM ABS GEL 12-7 (HEMOSTASIS) IMPLANT
SUCTION FRAZIER HANDLE 12FR (TUBING)
SUCTION TUBE FRAZIER 12FR DISP (TUBING) IMPLANT
SUT CHROMIC 3 0 PS 2 (SUTURE) ×9 IMPLANT
SUT CHROMIC 4 0 P 3 18 (SUTURE) IMPLANT
SYR 50ML LL SCALE MARK (SYRINGE) ×3 IMPLANT
TOWEL OR 17X26 10 PK STRL BLUE (TOWEL DISPOSABLE) ×3 IMPLANT
TUBING CONNECTING 10 (TUBING) ×2 IMPLANT
TUBING CONNECTING 10' (TUBING) ×1
WATER STERILE IRR 1500ML POUR (IV SOLUTION) ×3 IMPLANT
WATER TABLETS ICX (MISCELLANEOUS) ×3 IMPLANT
YANKAUER SUCT BULB TIP NO VENT (SUCTIONS) ×3 IMPLANT

## 2017-08-09 NOTE — Progress Notes (Signed)
Patient in Short Stay for pre op. Stated the paper prescriptions given to him by Dr. Lebron Conners was not able to be filled at the [facility] (corner of [address] and [address] [phone]). Patient stated he went last Friday to get these meds filled for post op (Ativan and Oxycodone). Script dated 10/19 and signed. Unsure why pharmacy was unable to fill. Called pharmacy to ask for patient and they will open at 0800. Will call back when opens at 0800. All patient's valuables including this prescription in sealed envelope, counted out with patient, and locked in box with security.

## 2017-08-09 NOTE — Transfer of Care (Signed)
Immediate Anesthesia Transfer of Care Note  Patient: [name]  Procedure(s) Performed: Procedure(s): Extraction of tooth #'s 1-6, 11-17,21,22,26-30 and 32 with alveoloplasty and bilateral mandibular tori reductions (N/A)  Patient Location: PACU  Anesthesia Type:General  Level of Consciousness:  sedated, patient cooperative and responds to stimulation  Airway & Oxygen Therapy:Patient Spontanous Breathing and Patient connected to face mask oxgen  Post-op Assessment:  Report given to PACU RN and Post -op Vital signs reviewed and stable  Post vital signs:  Reviewed and stable  Last Vitals: There were no vitals filed for this visit.  Complications: No apparent anesthesia complications

## 2017-08-09 NOTE — Anesthesia Preprocedure Evaluation (Signed)
Anesthesia Evaluation  Patient identified by MRN, date of birth, ID band Patient awake    Reviewed: Allergy & Precautions, NPO status , Patient's Chart, lab work & pertinent test results  Airway Mallampati: I  TM Distance: >3 FB Neck ROM: Full    Dental   Pulmonary Current Smoker,    Pulmonary exam normal        Cardiovascular Normal cardiovascular exam     Neuro/Psych    GI/Hepatic   Endo/Other    Renal/GU      Musculoskeletal   Abdominal   Peds  Hematology   Anesthesia Other Findings   Reproductive/Obstetrics                             Anesthesia Physical Anesthesia Plan  ASA: II  Anesthesia Plan: General   Post-op Pain Management:    Induction: Intravenous  PONV Risk Score and Plan: 1 and Ondansetron and Dexamethasone  Airway Management Planned: Nasal ETT  Additional Equipment:   Intra-op Plan:   Post-operative Plan: Extubation in OR  Informed Consent: I have reviewed the patients History and Physical, chart, labs and discussed the procedure including the risks, benefits and alternatives for the proposed anesthesia with the patient or authorized representative who has indicated his/her understanding and acceptance.     Plan Discussed with: CRNA and Surgeon  Anesthesia Plan Comments:         Anesthesia Quick Evaluation

## 2017-08-09 NOTE — Progress Notes (Signed)
PRE-OPERATIVE NOTE:  08/09/2017 [name] [id number]  VITALS: Ht 5\' 7"  (1.702 m)   Wt 137 lb (62.1 kg)   BMI 21.46 kg/m   Lab Results  Component Value Date   WBC 3.9 (L) 08/06/2017   HGB 12.3 (L) 08/06/2017   HCT 37.0 (L) 08/06/2017   MCV 87.1 08/06/2017   PLT 107 (L) 08/06/2017   BMET    Component Value Date/Time   NA 137 08/06/2017 1347   K 3.7 08/06/2017 1347   CL 100 (L) 08/06/2017 1347   CO2 27 08/06/2017 1347   GLUCOSE 91 08/06/2017 1347   BUN 11 08/06/2017 1347   CREATININE 0.56 (L) 08/06/2017 1347   CALCIUM 9.3 08/06/2017 1347   GFRNONAA >60 08/06/2017 1347   GFRAA >60 08/06/2017 1347    Lab Results  Component Value Date   INR 0.99 05/24/2013   INR 1.0 02/19/2009   No results found for: PTT   [name] presents for Multiple dental extractions with alveoloplasty and pre-prosthetic surgery as needed in the operating room general anesthesia.   SUBJECTIVE: The patient denies any acute medical or dental changes and agrees to proceed with treatment as planned.  EXAM: No sign of acute dental changes.  ASSESSMENT: Patient is affected by chronic apical periodontitis, multiple retained root segments, dental caries, chronic periodontitis, loose teeth, and bilateral mandibular lingual tori.   PLAN: Patient agrees to proceed with treatment as planned in the operating room as previously discussed and accepts the risks, benefits, and complications of the proposed treatment. Patient is aware of the risk for bleeding, bruising, swelling, infection, pain, nerve damage, soft tissue damage, sinus involvement, root tip fracture, mandible fracture, and the risks of complications associated with the anesthesia. Patient also is aware of the potential for other complications up to and including death due to his overall medical compromise and respiratory compromise.    Lenn Cal, DDS

## 2017-08-09 NOTE — H&P (Signed)
08/09/2017  Patient:            [name] Date of Birth:  [birth year] MRN:                [id number]   Ht 5\' 7"  (1.702 m)   Wt 137 lb (62.1 kg)   BMI 21.46 kg/m    [name] is a 56 year old male that presents for multiple dental extractions with alveoloplasty and pre-prosthetic surgery as dated the operating with general anesthesia. Patient denies any acute medical or dental changes. Please see note from Dr. Isidore Moos dated 08/03/2017 to act as the H&P for the dental operating room procedure.  Dr. Bonner Puna, MD  Radiation Oncology  Expand All Collapse All   [] Hide copied text [] Hover for attribution information .  Radiation Oncology         [phone] ________________________________  Initial outpatient Consultation  Name: [name]           MRN: [id number]         Date: 08/03/2017                    DOB: September 25, 1961  CC:[facility]  Melissa Montane, MD   REFERRING PHYSICIAN: Melissa Montane, MD  DIAGNOSIS:    ICD-10-CM   1. 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 male who presented with ongoing sore throat onset around the middle of June. Initially, the patient presented to urgent care on 04/26/17 complaining of a persistent sore throat which began approximately three weeks prior. Secondary to this he reports that he experienced a bulbous sensation to the throat. Initially, he was diagnosed with uvulitis as strep testing was negative. He was seen twice more at urgent care, once diagnosed with strep treated with amoxicillin and again treated for uvulitis. Each visit to urgent care was approximately one month apart from his initial visit. None of the remedies proposed by urgent care improved his initial symptoms. On  his last visit with urgent care ENT referral was given.   Subsequently, the patient saw Dr. Janace Hoard of [facility] on 06/30/17 who thought this was likely palate and tonsil carcinoma. He underwent laryngoscopy on 07/16/17 with biopsy which revealed squamous cell carcinoma.    Pertinent imaging thus far includes CT neck soft tissues performed on 07/27/17 revealing indistinct tonsillar enlargement on the left in the region of the tonsillar mass biopsy. This was thought to be a combination of post-biopsy edema or residual mass. Margins were not discrete and accurate measurement could not be performed. It was estimated to be about 3cm in size. No nodes were notable for enlargement or malignant involvement.   Following this, he was referred into radiation oncology to discuss the potential role of radiotherapy in the ongoing management of his disease.   Swallowing issues, if any: painful to swallow, otherwise no problems   Weight Changes: He has overall been maintaining his weight, +/- a 5lb weight loss.   Pain status: painful to swallow  Other symptoms: bulbous sensation to the throat  Tobacco history, if any: He reports that he has smoked ~1ppd x 30 years. He voices his desire to cut back on this.   ETOH abuse, if any: He reports that he drinks  about 1 beer and 1 pint of gin a day. He voices his desire to cut back on this. Additionally, his wife notes that he has attended several rehab programs in the past.   Prior cancers, if any: none  PREVIOUS RADIATION THERAPY: No  PAST MEDICAL HISTORY:  None reported other than this issue.  PAST SURGICAL HISTORY: none reported  FAMILY HISTORY: family history is not on file.  SOCIAL HISTORY:  reports that he has been smoking.  He has a 30.00 pack-year smoking history. He has never used smokeless tobacco. He reports that he drinks about 4.2 oz of alcohol per week . He reports that he does not use drugs. He currently lives in [place]. In  his free time he likes to ride his bicycle which he states that he rides approximately 20 miles a day.   ALLERGIES: Patient has no known allergies.  MEDICATIONS:        Current Outpatient Prescriptions  Medication Sig Dispense Refill  . acetaminophen (TYLENOL) 325 MG tablet Take 650 mg by mouth every 6 (six) hours as needed for moderate pain.      No current facility-administered medications for this encounter.     REVIEW OF SYSTEMS:  A 10+ POINT REVIEW OF SYSTEMS WAS OBTAINED including neurology, dermatology, psychiatry, cardiac, respiratory, lymph, extremities, GI, GU, Musculoskeletal, constitutional, HEENT.  All pertinent positives are noted in the HPI.  All others are negative.   PHYSICAL EXAM:  height is 5\' 7"  (1.702 m) and weight is 127 lb 9.6 oz (57.9 kg). His temperature is 99.7 F (37.6 C). His blood pressure is 113/88 and his pulse is 65. His oxygen saturation is 100%.   General: Alert and oriented, in no acute distress HEENT: Head is normocephalic. Extraocular movements are intact. He is missing several teeth. Oropharynx is notable for speckled, white tumor that appears to extend from the left palate through the uvula, crossing midline. Pt has a brisk gag reflex, it appears the left tonsil is involved.  Estimate tumor size - 4.5cm total Neck: No palpable masses are appreciated within the cervical, submandibular, or supraclavicular region.  Heart: Regular in rate and rhythm with no murmurs, rubs, or gallops.  Chest: Clear to auscultation bilaterally, with no rhonchi, wheezes, or rales.  Abdomen: Soft, nontender, nondistended, with no rigidity or guarding. Extremities: No cyanosis or edema. Lymphatics: see Neck Exam Skin: No concerning lesions. Musculoskeletal: symmetric strength and muscle tone throughout.  Neurologic: Cranial nerves II through XII are grossly intact. No obvious focalities. Speech is fluent. Coordination is intact. Psychiatric: Judgment and insight are  intact. Affect is appropriate.  ECOG = 1  LABORATORY DATA:  RecentLabs  Lab Results  Component Value Date   WBC 4.4 06/24/2017   HGB 12.2 (L) 06/24/2017   HCT 37.1 (L) 06/24/2017   MCV 86.5 06/24/2017   PLT 100 (L) 06/24/2017     CMP     Labs(Brief)          Component Value Date/Time   NA 140 05/29/2015 1648   K 3.7 05/29/2015 1648   CL 103 05/29/2015 1648   CO2 24 05/29/2015 1648   GLUCOSE 129 (H) 05/29/2015 1648   BUN <5 (L) 05/29/2015 1648   CREATININE 0.72 05/29/2015 1648   CALCIUM 9.3 05/29/2015 1648   PROT 7.6 05/29/2015 1648   ALBUMIN 4.5 05/29/2015 1648   AST 62 (H) 05/29/2015 1648   ALT 27 05/29/2015 1648   ALKPHOS 57 05/29/2015 1648   BILITOT 0.6 05/29/2015 1648  GFRNONAA >60 05/29/2015 1648   GFRAA >60 05/29/2015 1648           RADIOGRAPHY:  ImagingResults  Ct Soft Tissue Neck W Contrast  Result Date: 07/27/2017 CLINICAL DATA:  Right neck lipoma. Tonsillar cancer on the left with tonsil biopsy last week. EXAM: CT NECK WITH CONTRAST TECHNIQUE: Multidetector CT imaging of the neck was performed using the standard protocol following the bolus administration of intravenous contrast. CONTRAST:  51mL ISOVUE-300 IOPAMIDOL (ISOVUE-300) INJECTION 61% COMPARISON:  08/07/2006 FINDINGS: Pharynx and larynx: Edematous changes noted in the left tonsillar region. There is slight asymmetric tissue left to right. This could be a combination of post biopsy edema and tumor. Distinct margins cannot be established. Size estimation is 3 cm, based on coronal imaging measurements. No other mucosal or submucosal lesion is identified. Salivary glands: Submandibular and parotid glands are normal. Thyroid: Normal Lymph nodes: No enlarged or low-density nodes on either side of the neck. Vascular: Arterial and venous structures are patent. No carotid bifurcation calcification. Limited intracranial: Normal Visualized orbits: Normal Mastoids and visualized  paranasal sinuses: Mild sinus mucosal thickening. Skeleton: Ordinary mid cervical spondylosis. Upper chest: Emphysema.  No focal lesion. 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 This is present posterolateral to the jugular vein and posterior to the sternocleidomastoid muscle. There is complete ossification of the stylohyoid ligaments bilaterally. IMPRESSION: Indistinct tonsillar enlargement on the left in the region of left tonsillar mass biopsy. This could be a combination of post biopsy edema and residual mass. Margins are not discrete and accurate measurement cannot be performed. This is estimated at about 3 cm in size. No evidence of metastatic adenopathy. Right neck lipoma as described above. Calcified stylohyoid ligaments. Electronically Signed   By: Nelson Chimes M.D.   On: 07/27/2017 13:38       IMPRESSION/PLAN: [name] is a 56 y.o. male who presents today to discuss ongoing management of his squamous cell carcinoma of the left tonsil.   This is a delightful patient with head and neck cancer.  We discussed the potential risks, benefits, and side effects of radiotherapy. We talked in detail about acute and late effects. We discussed that some of the most bothersome acute effects may be mucositis, dysgeusia, salivary changes, skin irritation, hair loss, dehydration, weight loss and fatigue. We talked about late effects which include but are not necessarily limited to dysphagia, hypothyroidism, nerve injury, spinal cord injury, xerostomia, trismus, and neck edema. No guarantees of treatment were given. A consent form was signed and placed in the patient's medical record. The patient is enthusiastic about proceeding with treatment. I look forward to participating in the patient's care.      Additionally, I informed the patient that I would like to obtain a PET/CT to evaluate his  disease further. I also would like to refer him to Dr Lebron Conners after obtaining this to discuss the role of systemic therapy if needed. We will also order a PET to evaluate for  disease within the neck, throat, and body. CT poorly defined this tumor which does not enhance well - PET will be integral to RT planning. They are willing to travel to [facility] for this if needed . Most recent CT of the neck was performed on 07/27/17, but on my exam today his tumor seems more extensive than originally thought.   He will be discussed tomorrow at our head and  neck tumor board. If ENTsurgery believes that he would be a good candidate for TORS surgical resection we will make this referral. After we perform more imaging and he is cleared by dentistry we will perform simulation scanning.   Smoking cessation instruction/counseling given: counseled patient on the dangers of tobacco use, advised patient to stop smoking, and reviewed strategies to maximize success.    Alcohol cessation instructions/counseling was also given.   We also discussed that the treatment of head and neck cancer is a multidisciplinary process to maximize treatment outcomes and quality of life. For this reasons the following referrals have been or will be made:  Medical oncology to discuss chemotherapy if his PET scan warrants  Dentistry for dental evaluation, possible extractions in the radiation fields, and /or advice on reducing risk of cavities, osteoradionecrosis, or other oral issues.  Nutritionist for nutrition support during and after treatment.  Speech language pathology for swallowing and/or speech therapy.  Social work for social support.   Physical therapy due to risk of lymphedema in neck and deconditioning.  Baseline labs including TSH. __________________________________________   Eppie Gibson, MD  This document serves as a record of services personally performed by Eppie Gibson, MD. It was created on his  behalf by Reola Mosher, a trained medical scribe. The creation of this record is based on the scribe's personal observations and the provider's statements to them. This document has been checked and approved by the attending provider.     Electronically signed by Eppie Gibson, MD at 08/04/2017 7:20 PM Electronically signed by Eppie Gibson, MD at 08/04/2017 7:21 PM

## 2017-08-09 NOTE — Anesthesia Postprocedure Evaluation (Signed)
Anesthesia Post Note  Patient: RASAAN BROTHERTON  Procedure(s) Performed: Extraction of tooth #'s 1-6, 11-17,21,22,26-30 and 32 with alveoloplasty and bilateral mandibular tori reductions (N/A )     Patient location during evaluation: PACU Anesthesia Type: General Level of consciousness: awake and alert Pain management: pain level controlled Vital Signs Assessment: post-procedure vital signs reviewed and stable Respiratory status: spontaneous breathing, nonlabored ventilation, respiratory function stable and patient connected to nasal cannula oxygen Cardiovascular status: blood pressure returned to baseline and stable Postop Assessment: no apparent nausea or vomiting Anesthetic complications: no    Last Vitals:  Vitals:   08/09/17 1045 08/09/17 1100  BP: 127/80 128/86  Pulse: 74 75  Resp: 17 16  Temp:  37 C  SpO2: 100% 97%    Last Pain:  Vitals:   08/09/17 1045  PainSc: 6                  Nandana Krolikowski DAVID

## 2017-08-09 NOTE — Op Note (Signed)
OPERATIVE REPORT  Patient:            [name] Date of Birth:  1961/09/07 MRN:                [id number]   DATE OF PROCEDURE:  08/09/2017  PREOPERATIVE DIAGNOSES: 1. Squamous cell carcinoma of the left tonsil 2. Pre-chemoradiation therapy dental protocol 3. Chronic apical periodontitis 4. Dental caries 5. Retained root segments 6. Chronic periodontitis 7. Loose teeth 8. Bilateral mandibular lingual tori  POSTOPERATIVE DIAGNOSES: 1. Squamous cell carcinoma of the left tonsil 2. Pre-chemoradiation therapy dental protocol 3. Chronic apical periodontitis 4. Dental caries 5. Retained root segments 6. Chronic periodontitis 7. Loose teeth 8. Bilateral mandibular lingual tori  OPERATIONS: 1. Multiple extraction of tooth numbers 1 -6, 11-17, 21, 22, 26-30, and 32 2. Four Quadrants of alveoloplasty 3. Bilateral mandibular lingual tori reductions   SURGEON: Lenn Cal, DDS  ASSISTANT: Camie Patience, (dental assistant)  ANESTHESIA: General anesthesia via nasoendotracheal tube.  MEDICATIONS: 1. Ancef 2 g IV prior to invasive dental procedures. 2. Local anesthesia with a total utilization of 6 carpules each containing 34 mg of lidocaine with 0.017 mg of epinephrine as well as 2 carpules each containing 9 mg of bupivacaine with 0.009 mg of epinephrine.  SPECIMENS: There are 21 teeth that were discarded.  DRAINS: None  CULTURES: None  COMPLICATIONS: None   ESTIMATED BLOOD LOSS: 100 mLs.  INTRAVENOUS FLUIDS: 600 mLs of Lactated ringers solution.  INDICATIONS: The patient was recently diagnosed with squamous cell carcinoma of the left tonsil.  A medically necessary dental consultation was then requested to evaluate poor dentition as part of a pre-chemoradiation therapy dental protocol.  The patient was examined and treatment planned for extraction of remaining teeth with alveoloplasty and pre-prosthetic surgery as indictated in the operating room with general  anesthesia.  This treatment plan was formulated to decrease the risks and complications associated with dental infection from affecting the patient's systemic health and to prevent future complications such as osteoradionecrosis.  OPERATIVE FINDINGS: Patient was examined operating room number 3.  The teeth were identified for extraction. The patient was noted be affected by chronic periodontitis, chronic apical periodontitis, dental caries, retained root segments, bilateral mandibular lingual tori, and loose teeth.  DESCRIPTION OF PROCEDURE: Patient was brought to the main operating room number 3. Patient was then placed in the supine position on the operating table. General anesthesia was then induced per the anesthesia team. The patient was then prepped and draped in the usual manner for dental medicine procedure. A timeout was performed. The patient was identified and procedures were verified. A throat pack was placed at this time. The oral cavity was then thoroughly examined with the findings noted above. The patient was then ready for dental medicine procedure as follows:  Local anesthesia was then administered sequentially with a total utilization of 6 carpules each containing 34 mg of lidocaine with 0.017 mg of epinephrine as well as 2 carpules  each containing 9 mg bupivacaine with 0.009 mg of epinephrine.  The Maxillary left and right quadrants first approached. Anesthesia was then delivered utilizing infiltration with lidocaine with epinephrine. A #15 blade incision was then made from the maxillary right tuberosity and extended to the maxillary left tuberosity.  A  surgical flap was then carefully reflected. The maxillary teeth were then subluxated with a series of straight elevators. Appropriate amounts of buccal and interseptal bone were then removed utilizing a surgical handpiece and bur and copious amounts  of sterile water around tooth numbers 1, 6, 11, 12, 13, and 16. The teeth were then   again subluxated with a series of straight elevators. Tooth numbers 2, 3, 4, 5, 6, 11, 12, 13, 14, 15 were then removed with a 150 forceps without complications. Tooth #1 was then removed with a 53R forceps without complications. Tooth #16 was then removed with a 53L forceps without complications. Alveoloplasty was then performed utilizing a ronguers and bone file. The surgical site was then irrigated with copious amounts of sterile saline. The tissues were approximated and trimmed appropriately.  A piece of Surgifoam was then placed in the extraction sockets appropriately. The maxillary right surgical site was then closed from the maxillary right tuberosity and extended to the mesial #8 utilizing 3-0 chromic gut suture in a continuous interrupted suture technique 1. The maxillary left surgical site was then closed from the maxillary left tuberosity and extended the mesial #9 utilizing 3-0 chromic gut suture in a continuous interrupted suture technique 1.   At this point time, the mandibular quadrants were approached. The patient was given bilateral inferior alveolar nerve blocks and long buccal nerve blocks utilizing the bupivacaine with epinephrine. Further infiltration was then achieved utilizing the lidocaine with epinephrine. A 15 blade incision was then made from the distal of number 17 and extended to the distal of #32.  A surgical flap was then carefully reflected. The lower teeth were then subluxated with a series of straight elevators. Appropriate amounts of buccal and interseptal bone were then removed utilizing a surgical handpiece and copious amount of sterile water around tooth numbers 17, 21, 22, 27, 28, 29 ,30, and 32. The lower teeth were then again subluxated with a series of straight elevators. Tooth numbers  21, 22, 26, 27, 28, 29, and 30 were then removed with a 151 forceps without complications. Tooth numbers 17 and 32 were then removed with a 23 forceps without complications. Alveoloplasty  was then performed utilizing a rongeurs and bone file.  At this point time the lingual flaps were further reflected to expose the bilateral mandibular lingual tori. The bilateral mandibular lingual tori were then reduced utilizing a surgical handpiece and bur and copious amounts sterile saline. Alveoloplasty was then again performed utilizing a rongeurs and bone file. The surgical sites were then irrigated with copious amounts sterile saline 4. The tissues were approximated and trimmed appropriately. A piece of Surgifoam was placed in the extraction sockets appropriately. The mandibular left surgical site was then closed from the distal of #17 and extended to the mesial #24 utilizing 3-0 chromic gut suture in a continuous interrupted suture technique 1. The mandibular right surgical site was then closed from the distal of #32 and extended the mesial #25 utilizing 3-0 chromic gut suture in a continuous interrupted suture technique 1.    At this point time, the entire mouth was irrigated with copious amounts of sterile saline. The patient was examined for complications, seeing none, the dental medicine procedure was deemed to be complete. The throat pack was removed at this time. An oral airway was then placed at the request of the anesthesia team. A series of 4 x 4 gauze moistened with Amicar 5% rinse were placed in the mouth to aid hemostasis. The patient was then handed over to the anesthesia team for final disposition. After an appropriate amount of time, the patient was extubated and taken to the postanesthsia care unit in good condition. All counts were correct for the dental medicine procedure.  The patient is to continue the Amicar 5% rinses postoperatively. Patient is to rinse with 10 ML's every hour for the next 10 hours in a swish and spit manner. Patient was previously given a prescription for oxycodone by Dr. Lebron Conners to use for moderate to severe pain. Patient may supplement his pain medication regimen  with ibuprofen 200 mg taking 2-3 tablets every 8 hours as needed for inflammation and pain.    Lenn Cal, DDS.

## 2017-08-09 NOTE — Progress Notes (Signed)
Regarding note earlier today about patient's script written by Dr. Lebron Conners for ativan/oxycodone that he was unable to fill at [facility]. Spoke to Kit the pharmacist at [facility] and they had a scanned copy of the paper script. She was unable to tell why patient was unable to have filled when he was there Friday. Kit stated they would have ready for pick up today and he just needs to bring his paper copy of the script and get the meds. Will inform patient/ family post op.

## 2017-08-09 NOTE — Anesthesia Procedure Notes (Signed)
Procedure Name: Intubation Date/Time: 08/09/2017 7:56 AM Performed by: Lena Fieldhouse, Virgel Gess Pre-anesthesia Checklist: Patient identified, Emergency Drugs available, Suction available and Patient being monitored Patient Re-evaluated:Patient Re-evaluated prior to induction Oxygen Delivery Method: Circle System Utilized Preoxygenation: Pre-oxygenation with 100% oxygen Induction Type: IV induction Ventilation: Mask ventilation without difficulty Laryngoscope Size: Glidescope and 3 Grade View: Grade I Nasal Tubes: Nasal Rae and Nasal prep performed Tube size: 7.0 mm Number of attempts: 2 (1st attempt with MAC4, 2nd with glidescope) Airway Equipment and Method: Video-laryngoscopy Placement Confirmation: ETT inserted through vocal cords under direct vision,  positive ETCO2 and breath sounds checked- equal and bilateral Secured at: 26 cm Tube secured with: Tape Dental Injury: Teeth and Oropharynx as per pre-operative assessment  Comments: Easy mask ventilation without oral airway needed.  DL with MAC 4, visualization of mass, avoided this area.  Grade 3 view with MAC 4 by SRNA, passed to CRNA and anesthesiologist, Grade 3 view.  Dr. Conrad Pinnacle attempted Nasal rae ETT placement at this time, esophageal placement.  Moved toward glidescope, Grade I view with the Glide 3.  Easy placement of nasal rae at this time by Dr. Conrad Robins.

## 2017-08-09 NOTE — Discharge Instructions (Signed)

## 2017-08-10 ENCOUNTER — Other Ambulatory Visit (HOSPITAL_COMMUNITY): Payer: Self-pay | Admitting: Otolaryngology

## 2017-08-10 DIAGNOSIS — C099 Malignant neoplasm of tonsil, unspecified: Secondary | ICD-10-CM

## 2017-08-11 ENCOUNTER — Telehealth: Payer: Self-pay | Admitting: *Deleted

## 2017-08-11 NOTE — Telephone Encounter (Signed)
CALLED PATIENT TO INFORM THAT PET SCAN HAS BEEN MOVED TO 08-16-17 - ARRIVAL TIME - 10 AM @ [facility], PT. TO  BE NPO- AFTER MDNIGHT, SPOKE WITH PATIENT AND HE IS AWARE OF THIS TEST CHANGE AND HE IS GOOD WITH IT.

## 2017-08-12 ENCOUNTER — Encounter: Payer: Self-pay | Admitting: Hematology and Oncology

## 2017-08-12 DIAGNOSIS — F10930 Alcohol use, unspecified with withdrawal, uncomplicated: Secondary | ICD-10-CM | POA: Insufficient documentation

## 2017-08-12 DIAGNOSIS — Z720 Tobacco use: Secondary | ICD-10-CM | POA: Insufficient documentation

## 2017-08-12 DIAGNOSIS — F10230 Alcohol dependence with withdrawal, uncomplicated: Secondary | ICD-10-CM | POA: Insufficient documentation

## 2017-08-12 NOTE — Assessment & Plan Note (Addendum)
Previously, patient has exhibited symptoms of alcohol withdrawal with discontinuation of alcohol abuse. He also has attended rehabilitation centers in the past for this problem.  Considering need for aggressive anticancer therapy, I have recommended patient to stop alcohol consumption altogether at the present time. To assist him with withdrawal management: Prescription for lorazepam taper will be administered.  Plan: --Lorazepam for withdrawal symptoms

## 2017-08-12 NOTE — Assessment & Plan Note (Signed)
56 y.o. with left tonsillar carcinoma, still undergoing staging evaluation. Patient is scheduled to undergo dental extractions in 08/09/17 due to poor nutrition anticipation of potential chemoradiotherapy to the region. I agree with the current plan to obtain a PET/CT but anticipate that there might be some follows positive results on the PET/CT if it's obtained after dental extraction due to reactive changes in lymph nodes following the procedure.  Plan:  --Proceed with dental extractions as currently planned --Proceed with PET/CT as currently scheduled. --Return to my clinic in 2 weeks to review the findings. If no significant lymphadenopathy/FDG uptake in lymph nodes noted, patient may be able to avoid receiving systemic chemotherapy. Question of possibility of TORs procedure remains open at this time

## 2017-08-12 NOTE — Assessment & Plan Note (Signed)
Patient is current active smoker. He does understand the need to quit tobacco prior to initiation of aggressive therapy to eradicate his malignancy. He agrees to attempt using patch and lozenge combination.  Plan: --Nicoderm 21mg  TD Qday --Nicotine Lozenge 4 mg every 2 hours as needed

## 2017-08-12 NOTE — Progress Notes (Signed)
°  Called [facility] [employer](Deana) to follow up on status of PA for Oxycodone submitted on 08/10/17.  Per Deana PA approved 08/10/17-02/06/18 NXG#[id number].  Called [facility](Kathy) to advise of approval. She states patient picked up already and it went through [place].

## 2017-08-12 NOTE — Progress Notes (Signed)
[facility] [facility] New Visit:  Assessment: Squamous cell carcinoma of left tonsil ([place]) 56 y.o. with left tonsillar carcinoma, still undergoing staging evaluation. Patient is scheduled to undergo dental extractions in 08/09/17 due to poor nutrition anticipation of potential chemoradiotherapy to the region. I agree with the current plan to obtain a PET/CT but anticipate that there might be some follows positive results on the PET/CT if it's obtained after dental extraction due to reactive changes in lymph nodes following the procedure.  Plan:  --Proceed with dental extractions as currently planned --Proceed with PET/CT as currently scheduled. --Return to my clinic in 2 weeks to review the findings. If no significant lymphadenopathy/FDG uptake in lymph nodes noted, patient may be able to avoid receiving systemic chemotherapy. 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 He also has attended rehabilitation centers in the past for this problem.  Considering need for aggressive anticancer therapy, I have recommended patient to stop alcohol consumption altogether at the present time. To assist him with withdrawal management: Prescription for lorazepam taper will be administered.  Plan: --Lorazepam for withdrawal symptoms  Tobacco abuse Patient is current active smoker. He does understand the need to quit tobacco prior to initiation of aggressive therapy to eradicate his malignancy. He agrees to attempt using patch and lozenge combination.  Plan: --Nicoderm '21mg'$  TD Qday --Nicotine Lozenge 4 mg every 2 hours as needed   No orders of the defined types were placed in this encounter.   All questions were answered.  . The patient knows to call the clinic with any problems, questions or  concerns.  This note was electronically signed.    History of Presenting Illness [name] 56 y.o. presenting to the [facility] for evaluation and management as part of multidisciplinary team for diagnosis of squamous cell carcinoma of the left tonsil.   Patient initially presented with sore throat onset around the middle of June. He first presented to the Urgent Care on 04/26/17 after 3 weeks of symptoms. The sore throat was also accompanied by the globus sensation in the neck. He was treated for uvulitis initially, wihtout significant improvement despite two additional visits to the Urgent Care. On his last visit with the Urgent Care ENT referral was made.  The patient saw Dr. Janace Hoard from [facility] and underwent laryngoscopy on 07/16/17 with biopsy which confirmed presence of squamous cell carcinoma. Please see results of additional evaluation in the oncological history below. At the present time, additional assessment is pending. Patient was seen by dentistry and multiple dental extractions were recommended. He is scheduled for surgical removal of his teeth on 08/09/17.    Current symptoms include no fevers, chills, night sweats. 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 Denies chest pain, shortness of breath, or cough. No nausea, vomiting, abdominal pain, diarrhea, or constipation. No urinary or neurological symptoms. Patient is an active tobacco user, smoking at least 1ppd x 30 years. In addition, patient is habitual daily alcohol userdrinking as much as 1 beer (a 40oz) and 1 pint of gin per day. Patient has been treated for alcohol dependency in the past. His own presentation trace some degree of denial/attempts at minimizing his alcohol dependency as initially he only mentioned drinking 1 beer per day. His family has corrected him, and he acknowledged a higher  level of alcohol use. In the past, he has stopped drinking, but he does get withdrawal  symptoms rapidly.   Oncological/hematological History:   Squamous cell carcinoma of left tonsil ([place])   07/16/2017 Initial Diagnosis    Squamous cell carcinoma of left tonsil ([place])      07/16/2017 Pathology Results    Diagnosis: Tonsil, biopsy, left mass -- SQUAMOUS CELL CARCINOMA; The biopsy has at least squamous cell carcinoma in-situ. There are foci suspicious but not definitive for invasion. p16 immunohistochemistry is negative in the neoplastic cells. FINAL DIAGNOSIS       07/27/2017 Imaging    CT neck: Indistinct tonsillar enlargement on the left in the region of left tonsillar mass biopsy. This could be a combination of post biopsy edema and residual mass. Margins are not discrete and accurate measurement cannot be performed. This is estimated at about 3 cm in size. No evidence of metastatic adenopathy.       Medical History: Past Medical History:  Diagnosis Date  . Cancer [facility])    left tonsil cancer    Surgical History: Past Surgical History:  Procedure Laterality Date  . MULTIPLE EXTRACTIONS WITH ALVEOLOPLASTY N/A 08/09/2017   Procedure: Extraction of tooth #'s 1-6, 11-17,21,22,26-30 and 32 with alveoloplasty and bilateral mandibular tori reductions;  Surgeon: Lenn Cal, DDS;  Location: [facility];  Service: Oral Surgery;  Laterality: N/A;  . tonsil biopsy     left tonsil    Family History: Family History  Problem Relation Age of Onset  . Diabetes Mother     Social History: Social History   Social History  . Marital status: Single    Spouse name: N/A  . Number of children: 1  . Years of education: N/A   Occupational History  . Disabled    Social History Main Topics  . Smoking status: Current Every Day Smoker    Packs/day: 1.00    Years: 30.00  . Smokeless tobacco: Never Used  . Alcohol use 4.2 oz/week    7 Cans of beer per week     Comment: 1 pint daily   . Drug use: No     Comment: he has a past history of cocaine abuse, he denies current  use  . Sexual activity: Not on file   Other Topics Concern  . Not on file   Social History Narrative  . No narrative on file    Allergies: No Known Allergies  Medications:  Current Outpatient Prescriptions  Medication Sig Dispense Refill  . acetaminophen (TYLENOL) 325 MG tablet Take 650 mg by mouth every 6 (six) hours as needed for moderate pain.     Marland Kitchen LORazepam (ATIVAN) 1 MG tablet Take 1 tablet (1 mg total) by mouth 2 (two) times daily. 28 tablet 0  . nicotine (NICODERM CQ - DOSED IN MG/24 HOURS) 21 mg/24hr patch Place 1 patch (21 mg total) onto the skin daily. 28 patch 0  . nicotine polacrilex (NICOTINE MINI) 4 MG lozenge Take 1 lozenge (4 mg total) by mouth as needed for smoking cessation. 100 tablet 0  . oxyCODONE 10 MG TABS Take 1 tablet (10 mg total) by mouth every 4 (four) hours as needed for severe pain. 50 tablet 0   No current facility-administered medications for this visit.     Review of Systems: Review of Systems - Oncology   PHYSICAL EXAMINATION Blood pressure 120/76, pulse (!) 59, temperature 99.6 F (37.6 C), temperature source Oral, resp. rate 18, height '5\' 7"'$  (1.702 m),  weight 135 lb 4.8 oz (61.4 kg), SpO2 100 %.  ECOG PERFORMANCE STATUS: 1 - Symptomatic but completely ambulatory  Physical Exam  Constitutional: He is oriented to person, place, and time and well-developed, well-nourished, and in no distress. No distress.  HENT:  Head: Normocephalic and atraumatic.  Mouth/Throat: Uvula is midline, oropharynx is clear and moist and mucous membranes are normal. He does not have dentures. Oral lesions present. Abnormal dentition. Dental caries present. No dental abscesses or uvula swelling. No oropharyngeal exudate or tonsillar abscesses.  Eyes: Pupils are equal, round, and reactive to light. Conjunctivae and EOM are normal. No scleral icterus.  Neck: No thyromegaly present.  Cardiovascular: Normal rate, regular rhythm, normal heart sounds and intact distal  pulses.   No murmur heard. Pulmonary/Chest: Effort normal and breath sounds normal. No respiratory distress. He has no wheezes. He has no rales.  Abdominal: Soft. Bowel sounds are normal. He exhibits no distension and no mass. There is no tenderness. There is no rebound and no guarding.  Musculoskeletal: Normal range of motion. He exhibits no edema.  Lymphadenopathy:    He has no cervical adenopathy.  Neurological: He is alert and oriented to person, place, and time. He has normal reflexes. He displays normal reflexes. No cranial nerve deficit. Coordination normal.  Skin: Skin is warm and dry. No rash noted. He is not diaphoretic. No erythema.     LABORATORY DATA: I have personally reviewed the data as listed: Hospital Outpatient Visit on 08/06/2017  Component Date Value Ref Range Status  . Sodium 08/06/2017 137  135 - 145 mmol/L Final  . Potassium 08/06/2017 3.7  3.5 - 5.1 mmol/L Final  . Chloride 08/06/2017 100* 101 - 111 mmol/L Final  . CO2 08/06/2017 27  22 - 32 mmol/L Final  . Glucose, Bld 08/06/2017 91  65 - 99 mg/dL Final  . BUN 08/06/2017 11  6 - 20 mg/dL Final  . Creatinine, Ser 08/06/2017 0.56* 0.61 - 1.24 mg/dL Final  . Calcium 08/06/2017 9.3  8.9 - 10.3 mg/dL Final  . GFR calc non Af Amer 08/06/2017 >60  >60 mL/min Final  . GFR calc Af Amer 08/06/2017 >60  >60 mL/min Final   Comment: (NOTE) The eGFR has been calculated using the CKD EPI equation. This calculation has not been validated in all clinical situations. eGFR's persistently <60 mL/min signify possible Chronic Kidney Disease.   . Anion gap 08/06/2017 10  5 - 15 Final  . WBC 08/06/2017 3.9* 4.0 - 10.5 K/uL Final  . RBC 08/06/2017 4.25  4.22 - 5.81 MIL/uL Final  . Hemoglobin 08/06/2017 12.3* 13.0 - 17.0 g/dL Final  . HCT 08/06/2017 37.0* 39.0 - 52.0 % Final  . MCV 08/06/2017 87.1  78.0 - 100.0 fL Final  . MCH 08/06/2017 28.9  26.0 - 34.0 pg Final  . MCHC 08/06/2017 33.2  30.0 - 36.0 g/dL Final  . RDW  08/06/2017 14.2  11.5 - 15.5 % Final  . Platelets 08/06/2017 107* 150 - 400 K/uL Final   Comment: REPEATED TO VERIFY SPECIMEN CHECKED FOR CLOTS PLATELET COUNT CONFIRMED BY SMEAR          Ardath Sax, MD

## 2017-08-13 ENCOUNTER — Telehealth: Payer: Self-pay | Admitting: *Deleted

## 2017-08-13 NOTE — Telephone Encounter (Signed)
Oncology Nurse Navigator Documentation  In follow-up to patient sister's call today indicating letter receipt of PET denial, called to inform approval has been received, PET scheduled for 10:30 [facility] with 10:00 arrival, NPO status midnight before.  She voiced understanding.  Gayleen Orem, RN, BSN, [place] [facility] Oncology Nurse [place] at [place] [phone]

## 2017-08-16 ENCOUNTER — Encounter (HOSPITAL_COMMUNITY)
Admission: RE | Admit: 2017-08-16 | Discharge: 2017-08-16 | Disposition: A | Discharge status: Home / Self Care | Payer: Medicaid Other | Source: Ambulatory Visit | Attending: Radiation Oncology | Admitting: Radiation Oncology

## 2017-08-16 DIAGNOSIS — C099 Malignant neoplasm of tonsil, unspecified: Secondary | ICD-10-CM | POA: Diagnosis not present

## 2017-08-16 LAB — GLUCOSE, CAPILLARY: Glucose-Capillary: 117 mg/dL — ABNORMAL HIGH (ref 65–99)

## 2017-08-16 MED ORDER — FLUDEOXYGLUCOSE F - 18 (FDG) INJECTION
6.7700 | Freq: Once | INTRAVENOUS | Status: AC | PRN
  Administered 2017-08-16: 6.77 via INTRAVENOUS

## 2017-08-17 ENCOUNTER — Telehealth: Payer: Self-pay | Admitting: *Deleted

## 2017-08-17 NOTE — Progress Notes (Signed)
Need CT SIM order.

## 2017-08-17 NOTE — Telephone Encounter (Signed)
Oncology Nurse Navigator Documentation  Unable to reach pt to confirm upcoming appts, spoke with sister Donnamae Jude, confirmed/informed 10/31 10:15 Dental Medicine, 11/2 3:40 [place], 11/5 10:15 CT SIM.  She voiced understanding.  Gayleen Orem, RN, BSN, [place] [facility] Oncology Nurse [place] at [place] [phone]

## 2017-08-18 ENCOUNTER — Encounter: Payer: Self-pay | Admitting: *Deleted

## 2017-08-18 ENCOUNTER — Ambulatory Visit (HOSPITAL_COMMUNITY): Payer: Medicaid - Dental | Admitting: Dentistry

## 2017-08-18 ENCOUNTER — Encounter (HOSPITAL_COMMUNITY): Payer: Self-pay | Admitting: Dentistry

## 2017-08-18 VITALS — BP 103/58 | HR 65 | Temp 98.7°F

## 2017-08-18 DIAGNOSIS — C099 Malignant neoplasm of tonsil, unspecified: Secondary | ICD-10-CM

## 2017-08-18 DIAGNOSIS — K0820 Unspecified atrophy of edentulous alveolar ridge: Secondary | ICD-10-CM

## 2017-08-18 DIAGNOSIS — K08109 Complete loss of teeth, unspecified cause, unspecified class: Secondary | ICD-10-CM

## 2017-08-18 DIAGNOSIS — K08199 Complete loss of teeth due to other specified cause, unspecified class: Secondary | ICD-10-CM

## 2017-08-18 DIAGNOSIS — Z01818 Encounter for other preprocedural examination: Secondary | ICD-10-CM

## 2017-08-18 NOTE — Progress Notes (Signed)
Oncology Nurse Navigator Documentation  Provided patient case of Ensure Plus.    Gayleen Orem, RN, BSN, [place] [facility] Oncology Nurse [place] at [place] [phone]

## 2017-08-18 NOTE — Progress Notes (Signed)
POST OPERATIVE NOTE:  08/18/2017 [name] [id number]  VITALS: BP (!) 103/58 (BP Location: Left Arm)   Pulse 65   Temp 98.7 F (37.1 C) (Oral)   LABS:  Lab Results  Component Value Date   WBC 3.9 (L) 08/06/2017   HGB 12.3 (L) 08/06/2017   HCT 37.0 (L) 08/06/2017   MCV 87.1 08/06/2017   PLT 107 (L) 08/06/2017   BMET    Component Value Date/Time   NA 137 08/06/2017 1347   K 3.7 08/06/2017 1347   CL 100 (L) 08/06/2017 1347   CO2 27 08/06/2017 1347   GLUCOSE 91 08/06/2017 1347   BUN 11 08/06/2017 1347   CREATININE 0.56 (L) 08/06/2017 1347   CALCIUM 9.3 08/06/2017 1347   GFRNONAA >60 08/06/2017 1347   GFRAA >60 08/06/2017 1347    Lab Results  Component Value Date   INR 0.99 05/24/2013   INR 1.0 02/19/2009   No results found for: PTT   [name] is status post extraction of remaining teeth with alveoloplasty and pre-prosthetic surgery in the operating room with general anesthesia on 08/09/2017.   SUBJECTIVE: Patient with minimal dental complaints. Patient is primarily using a soft diet and supplements the diet with 5-6 cans of Ensure Plus daily.  EXAM: There is no sign of infection, heme, or ooze. Patient is healing in by generalized primary closure. Patient is also healing in by secondary intention involving the lower left molar area, mandibular anterior area , and mandibular right molar areas.  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  PROCEDURE: The patient was given a chlorhexidine gluconate rinse for 30 seconds. Sutures were then removed without complication. Patient tolerated the procedure well.  ASSESSMENT: Post operative course is consistent with dental procedures performed in the operating room with general anesthesia. Loss of remaining teeth due to extraction Patient is now completely edentulous There is atrophy of the edentulous alveolar ridges.  PLAN: 1. Continue saltwater rinses every  2 hours while awake to aid healing. 2. Brush tongue daily 3. Advance diet as tolerated with continued use of Ensure Plus nutritional supplementation. 4. Return to clinic for reevaluation of healing and oral examination during radiation therapy as scheduled. 5. The patient is cleared for radiation therapy after 08/23/2017.  6. Patient to follow-up with a dentist of his choice for fabrication of upper lower complete dentures approximately 3 months after the last radiation therapy is been provided.   Lenn Cal, DDS

## 2017-08-18 NOTE — Patient Instructions (Addendum)
PLAN: 1. Continue saltwater rinses every 2 hours while awake to aid healing. 2. Brush tongue daily 3. Advance diet as tolerated with continued use of Ensure Plus nutritional supplementation. 4. Return to clinic for reevaluation of healing and oral examination during radiation therapy as scheduled. 5. The patient is cleared for radiation therapy after 08/23/2017.  6. Patient to follow-up with a dentist of his choice for fabrication of upper lower complete dentures approximately 3 months after the last radiation therapy is been provided.   Lenn Cal, DDS

## 2017-08-19 ENCOUNTER — Ambulatory Visit: Payer: Medicaid Other | Admitting: Physical Therapy

## 2017-08-19 NOTE — Progress Notes (Signed)
Has armband been applied? Yes  Does patient have an allergy to IV contrast dye?: No   Has patient ever received premedication for IV contrast dye?: N/A  Does patient take metformin?: No  If patient does take metformin when was the last dose: N/A  Date of lab work: 08/06/17 BUN: 11 CR: 0.56 EGFR: >60  IV site: Left Antecubital  Has IV site been added to flowsheet?  Yes  BP (!) 136/92   Pulse 64   Temp 97.7 F (36.5 C)   SpO2 98% Comment: room air

## 2017-08-20 ENCOUNTER — Ambulatory Visit (HOSPITAL_BASED_OUTPATIENT_CLINIC_OR_DEPARTMENT_OTHER): Payer: Medicaid Other | Admitting: Hematology and Oncology

## 2017-08-20 ENCOUNTER — Encounter: Payer: Self-pay | Admitting: Hematology and Oncology

## 2017-08-20 ENCOUNTER — Encounter: Payer: Self-pay | Admitting: *Deleted

## 2017-08-20 ENCOUNTER — Encounter (HOSPITAL_COMMUNITY): Payer: Medicaid Other

## 2017-08-20 VITALS — BP 117/75 | HR 81 | Temp 99.3°F | Resp 17 | Ht 67.0 in | Wt 130.6 lb

## 2017-08-20 DIAGNOSIS — C099 Malignant neoplasm of tonsil, unspecified: Secondary | ICD-10-CM

## 2017-08-20 NOTE — Patient Instructions (Signed)
Thank you for choosing [facility] to provide your oncology and hematology care.  To afford each patient quality time with our providers, please arrive 30 minutes before your scheduled appointment time.  If you arrive late for your appointment, you may be asked to reschedule.  We strive to give you quality time with our providers, and arriving late affects you and other patients whose appointments are after yours.   If you are a no show for multiple scheduled visits, you may be dismissed from the clinic at the providers discretion.    Again, thank you for choosing [facility], our hope is that these requests will decrease the amount of time that you wait before being seen by our physicians.  ______________________________________________________________________  Should you have questions after your visit to the [facility], please contact our office at [phone] between the hours of 8:30 and 4:30 p.m.    Voicemails left after 4:30p.m will not be returned until the following business day.    For prescription refill requests, please have your pharmacy contact us directly.  Please also try to allow 48 hours for prescription requests.    Please contact the scheduling department for questions regarding scheduling.  For scheduling of procedures such as PET scans, CT scans, MRI, Ultrasound, etc please contact central scheduling at [phone].    Resources For Cancer Patients and Caregivers:   Oncolink.org:  A wonderful resource for patients and healthcare providers for information regarding your disease, ways to tract your treatment, what to expect, etc.     American Cancer Society:  800-227-2345  Can help patients locate various types of support and financial assistance  Cancer Care: 1-800-813-HOPE (4673) Provides financial assistance, online support groups, medication/co-pay assistance.    [place] DSS:  336-641-3447 Where to apply for food  stamps, Medicaid, and utility assistance  Medicare Rights Center: 800-333-4114 Helps people with Medicare understand their rights and benefits, navigate the Medicare system, and secure the quality healthcare they deserve  SCAT: 336-333-6589 [employer] [employer]'s shared-ride transportation service for eligible riders who have a disability that prevents them from riding the fixed route bus.    For additional information on assistance programs please contact our social worker:   Grier Hock/Abigail Elmore:  [phone]            

## 2017-08-23 ENCOUNTER — Ambulatory Visit
Admission: RE | Admit: 2017-08-23 | Discharge: 2017-08-23 | Disposition: A | Discharge status: Home / Self Care | Payer: Medicaid Other | Source: Ambulatory Visit | Attending: Radiation Oncology | Admitting: Radiation Oncology

## 2017-08-23 ENCOUNTER — Encounter: Payer: Self-pay | Admitting: *Deleted

## 2017-08-23 VITALS — BP 136/92 | HR 64 | Temp 97.7°F

## 2017-08-23 DIAGNOSIS — C099 Malignant neoplasm of tonsil, unspecified: Secondary | ICD-10-CM

## 2017-08-23 DIAGNOSIS — Z510 Encounter for antineoplastic radiation therapy: Secondary | ICD-10-CM | POA: Diagnosis not present

## 2017-08-23 MED ORDER — SODIUM CHLORIDE 0.9% FLUSH
10.0000 mL | Freq: Once | INTRAVENOUS | Status: AC
  Administered 2017-08-23: 10 mL via INTRAVENOUS

## 2017-08-23 NOTE — Progress Notes (Signed)
Oncology Nurse Navigator Documentation  Met with [name] during his CT SIM.  He was accompanied by his sister. He tolerated procedure with difficulty, denied concerns/questions. I showed them LINAC 3 tmt area, explained registration, arrival and preparation procedures. Provided PAC and PEG education in Nursing, provided written handouts, showed examples.  He voiced understanding I will provide additional PEG education prior to placement. I provided multiple copies of Epic appt calendar showing RT and other appts.  I emphasized importance of appt compliance as procedures are scheduled/tmts begin.  Gayleen Orem, RN, BSN, [place] Neck Oncology Nurse [place] at [place] [phone]

## 2017-08-23 NOTE — Progress Notes (Signed)
Head and Neck Cancer Simulation, IMRT treatment planning, and Special treatment procedure note   Outpatient  Diagnosis:    ICD-10-CM   1. 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 An Aquaplast head and shoulder mask was custom fitted to the patient's anatomy. High-resolution CT axial imaging was obtained of the head and neck with contrast. I verified that the quality of the imaging is good for treatment planning. 1 Medically Necessary Treatment Device was fabricated and supervised by me: Aquaplast mask.   Treatment planning note I plan to treat the patient with IMRT. I plan to treat the patient's tumor and bilateral neck nodes. I plan to treat to a total dose of 70 Gray in 35  fractions. Dose calculation was ordered from dosimetry.  IMRT planning Note  IMRT is medically necessary and an important modality to deliver adequate dose to the patient's at risk tissues while sparing the patient's normal structures, including the: esophagus, parotid tissue, mandible, brain stem, spinal cord, oral cavity, brachial plexus.  This justifies the use of IMRT in the patient's treatment.   Special Treatment Procedure Note:  The patient will be receiving chemotherapy concurrently. Chemotherapy heightens the risk of side effects. I have considered this during the patient's treatment planning process and will monitor the patient accordingly for side effects on a weekly basis. Concurrent chemotherapy increases the complexity of this patient's treatment and therefore this constitutes a special treatment procedure.  -----------------------------------  Eppie Gibson, MD

## 2017-08-23 NOTE — Progress Notes (Signed)
Oncology Nurse Navigator Documentation  Met with Mr. Filsinger during est pt appt with Dr. Lebron Conners.  He was accompanied by his sister Danae Chen. He voiced understanding of concurrent chemo/radiation, plan for weekly tmt. We discussed value of PAC and PEG, he understood I will provide additional education next week when he arrives for CT SIM. They were encouraged to call me with questions/concerns.  Gayleen Orem, RN, BSN, [place] Neck Oncology Nurse [place] at [place] [phone]

## 2017-08-26 ENCOUNTER — Telehealth: Payer: Self-pay | Admitting: *Deleted

## 2017-08-26 NOTE — Telephone Encounter (Signed)
Oncology Nurse Navigator Documentation  Called pt in follow-up to review of pt schedule and indication he cancelled 11/16 PEG/PAC appt. He stated he was adamantly against having placements, "going to make me old, keep me from doing". He stated he plans to proceed with RT and chemo as scheduled. I provided additional explanation re benefit of PAC for IV access, importance of PEG to maintain nutritional and hydration status during tmt, the potential of hospitalization.  I offered to have patient mentor call him to share experience of PEG/PAC, he denied. I encouraged him to reconsider decision, call me for further guidance.  Gayleen Orem, RN, BSN, [place] Neck Oncology Nurse [place] at [place] [phone]

## 2017-08-27 ENCOUNTER — Telehealth: Payer: Self-pay

## 2017-08-27 DIAGNOSIS — Z510 Encounter for antineoplastic radiation therapy: Secondary | ICD-10-CM | POA: Diagnosis not present

## 2017-08-27 NOTE — Telephone Encounter (Signed)
Attempt to call pt to determine if our office can help with obtaining a second opinion as soon as possible. Pt should not delay treatment long per physician recommendation. VM was not left because recipient said name of phone owner was "Scotty". Note left for Tonye Royalty, RN to f/u on 11/12.

## 2017-08-30 ENCOUNTER — Encounter: Payer: Self-pay | Admitting: Radiation Oncology

## 2017-08-30 ENCOUNTER — Telehealth: Payer: Self-pay | Admitting: *Deleted

## 2017-08-30 ENCOUNTER — Other Ambulatory Visit: Payer: Self-pay | Admitting: Hematology and Oncology

## 2017-08-30 DIAGNOSIS — C099 Malignant neoplasm of tonsil, unspecified: Secondary | ICD-10-CM

## 2017-08-30 NOTE — Telephone Encounter (Signed)
Oncology Nurse Navigator Documentation  Returned VMM from Mr. Borges sister Rise Paganini.  She indicated after further discussion over the weekend, he has agreed to PEG/PAC placement.  I provided 11/26 11:30 appt per my conversation with Tiffany IR.  She voiced understanding, indicated she would communicate info to brother.  Gayleen Orem, RN, BSN, [place] Neck Oncology Nurse [place] at [place] [phone]

## 2017-08-31 ENCOUNTER — Ambulatory Visit
Admission: RE | Admit: 2017-08-31 | Discharge: 2017-08-31 | Disposition: A | Discharge status: Home / Self Care | Payer: Medicaid Other | Source: Ambulatory Visit | Attending: Radiation Oncology | Admitting: Radiation Oncology

## 2017-08-31 ENCOUNTER — Telehealth: Payer: Self-pay

## 2017-08-31 ENCOUNTER — Ambulatory Visit: Payer: Medicaid Other | Admitting: Nutrition

## 2017-08-31 ENCOUNTER — Ambulatory Visit: Payer: Medicaid Other | Admitting: Physical Therapy

## 2017-08-31 ENCOUNTER — Ambulatory Visit: Payer: Medicaid Other | Attending: Radiation Oncology

## 2017-08-31 ENCOUNTER — Other Ambulatory Visit: Payer: Self-pay

## 2017-08-31 ENCOUNTER — Encounter: Payer: Self-pay | Admitting: *Deleted

## 2017-08-31 ENCOUNTER — Other Ambulatory Visit: Payer: Medicaid Other

## 2017-08-31 VITALS — BP 109/77 | HR 80 | Temp 98.8°F | Wt 123.2 lb

## 2017-08-31 DIAGNOSIS — R1310 Dysphagia, unspecified: Secondary | ICD-10-CM

## 2017-08-31 DIAGNOSIS — R293 Abnormal posture: Secondary | ICD-10-CM | POA: Diagnosis present

## 2017-08-31 DIAGNOSIS — Z9189 Other specified personal risk factors, not elsewhere classified: Secondary | ICD-10-CM

## 2017-08-31 DIAGNOSIS — C099 Malignant neoplasm of tonsil, unspecified: Secondary | ICD-10-CM

## 2017-08-31 DIAGNOSIS — IMO0002 Reserved for concepts with insufficient information to code with codable children: Secondary | ICD-10-CM

## 2017-08-31 MED ORDER — PROCHLORPERAZINE MALEATE 10 MG PO TABS: 10.0000 mg | ORAL_TABLET | Freq: Four times a day (QID) | ORAL | 1 refills | Status: DC | PRN

## 2017-08-31 MED ORDER — LIDOCAINE-PRILOCAINE 2.5-2.5 % EX CREA: TOPICAL_CREAM | CUTANEOUS | 3 refills | Status: DC

## 2017-08-31 MED ORDER — LORAZEPAM 0.5 MG PO TABS: 0.5000 mg | ORAL_TABLET | Freq: Four times a day (QID) | ORAL | 0 refills | Status: DC | PRN

## 2017-08-31 MED ORDER — DEXAMETHASONE 4 MG PO TABS: 8.0000 mg | ORAL_TABLET | Freq: Every day | ORAL | 1 refills | Status: DC

## 2017-08-31 MED ORDER — ONDANSETRON HCL 8 MG PO TABS: 8.0000 mg | ORAL_TABLET | Freq: Two times a day (BID) | ORAL | 1 refills | Status: DC | PRN

## 2017-08-31 NOTE — Telephone Encounter (Signed)
Added labs and printed calender for upcoming appointments, and gave to patient In head and neck clinic.per 11/13 los

## 2017-08-31 NOTE — Progress Notes (Signed)
Head & Neck Multidisciplinary Clinic Clinical Social Work  Clinical Social Work met with patient at head & neck multidisciplinary clinic to offer support and assess for psychosocial needs.  [name] was alone for today's visit.  Patient reports no concerns, only eager to started with treatment.  The patient did indicate concerns regarding riding the bus through treatment, interested in SC[employer] transportation option.  CSW asked patient if he met with [name], financial counselor, and did he receive 31 day bus pass- patient reports he did not receive a pass and "maybe my sister has it".  CSW will follow up with [name] and meet with patient tomorrow in infusion to sign up for SCAT services if appropriate.  Clinical Social Work briefly discussed Clinical Social Work role and [employer] support programs/services.  Clinical Social Work encouraged patient to call with any additional questions or concerns.   [name], MSW, LCSW, OSW-C Clinical Social Worker [facility] [phone]

## 2017-08-31 NOTE — Therapy (Signed)
[place], [place], [address] Phone: [phone]   Fax:  [phone]  Physical Therapy Evaluation  Patient Details  Name: [name] MRN: [id number] Date of Birth: [birth year] Referring Provider: Dr. Eppie Gibson   Encounter Date: 08/31/2017  PT End of Session - 08/31/17 1309    Visit Number  1    Number of Visits  1    PT Start Time  1118    PT Stop Time  1138    PT Time Calculation (min)  20 min    Activity Tolerance  Patient tolerated treatment well    Behavior During Therapy  [facility] for tasks assessed/performed       Past Medical History:  Diagnosis Date  . Cancer [facility])    left tonsil cancer    Past Surgical History:  Procedure Laterality Date  . tonsil biopsy     left tonsil    There were no vitals filed for this visit.   Subjective Assessment - 08/31/17 1257    Subjective  "Can you help me with this schedule for chemo and radiation?"    Pertinent History  Left tonsil squamous cell carcinoma, p16 negative.  Plan is for RT/chemo concurrent treatment, RT to tumor and bilateral neck nodes.  Smoker, drinker.      Patient Stated Goals  get info from all head & neck clinic providers    Currently in Pain?  Yes    Pain Score  6     Pain Location  Neck    Pain Orientation  Left;Anterior;Lateral    Pain Descriptors / Indicators  Burning    Aggravating Factors   eating    Pain Relieving Factors  tylenol, time         [facility] PT Assessment - 08/31/17 1300      Assessment   Medical Diagnosis  left tonsil squamous cell carcinoma    Referring Provider  Dr. Eppie Gibson    Prior Therapy  none      Precautions   Precautions  Other (comment)    Precaution Comments  cancer precautions      Restrictions   Weight Bearing Restrictions  No      Balance Screen   Has the patient fallen in the past 6 months  No    Has the patient had a decrease in activity level because of a fear of  falling?   No    Is the patient reluctant to leave their home because of a fear of falling?   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   Overall Cognitive Status  Within Functional Limits for tasks assessed      Observation/Other Assessments   Observations  thin gentleman who is polite but seems in a hurry      Coordination   Gross Motor Movements are Fluid and Coordinated  Yes      Functional Tests   Functional tests  Sit to Stand      Sit to Stand   Comments  8 times in 30 seconds,  well below average for age      Posture/Postural Control   Posture/Postural Control  Postural limitations    Postural Limitations  Rounded Shoulders;Forward head very rounded back in sitting      ROM / Strength   AROM / PROM / Strength  PROM      PROM   Overall PROM Comments  neck and shoulders both grossly [facility]      Ambulation/Gait   Ambulation/Gait  Yes    Ambulation/Gait Assistance  7: Independent        LYMPHEDEMA/ONCOLOGY QUESTIONNAIRE - 08/31/17 1305      Type   Cancer Type  left tonsil SCC      Treatment   Active Chemotherapy Treatment  No to start 11/14    Active Radiation Treatment  No to start soon      Lymphedema Assessments   Lymphedema Assessments  Head and Neck      Head and Neck   4 cm superior to sternal notch around neck  37 cm    6 cm superior to sternal notch around neck  36.2 cm    8 cm superior to sternal notch around neck  36.2 cm    Other  35.8 cm. at 10 cm. superior to sternal notch          Objective measurements completed on examination: See above findings.              PT Education - 08/31/17 1308    Education provided  Yes     Education Details  neck ROM, posture, breathing, staying active, CURE article, "Why exercise?' flyer, lymphedema and PT info    Person(s) Educated  Patient    Methods  Explanation;Handout    Comprehension  Verbalized understanding              Head and Neck Clinic Goals - 08/31/17 1313      Patient will be able to verbalize understanding of a home exercise program for cervical range of motion, posture, and walking.    Status  Achieved      Patient will be able to verbalize understanding of proper sitting and standing posture.    Status  Achieved      Patient will be able to verbalize understanding of lymphedema risk and availability of treatment for this condition.    Status  Achieved         Plan - 08/31/17 1309    Clinical Impression Statement  Serious gentleman with left tongue cancer who will undergo chemoradiation.  h/o smoking and drinking heavily. He has poor posture, decreased performance on 30 second sit to stand, and neck pain.    History and Personal Factors relevant to plan of care:  ETOH abuse    Clinical Presentation  Evolving    Clinical Presentation due to:  starting chemorad soon    Clinical Decision Making  Moderate    Rehab Potential  Good    PT Frequency  One time visit    PT Treatment/Interventions  Patient/family education    PT Next Visit Plan  None planned; will follow-up should lymphedema develop    PT Home Exercise Plan  continue biking, neck ROM    Consulted and Agree with Plan of Care  Patient       Patient will benefit from skilled therapeutic intervention in order to improve the following deficits and impairments:  Postural dysfunction, Decreased mobility  Visit Diagnosis: Squamous cell carcinoma of left tonsil (HCC) - Plan: PT  plan of care cert/re-cert  At risk for lymphedema - Plan: PT plan of care cert/re-cert  Abnormal posture - Plan: PT plan of care cert/re-cert     Problem List Patient Active Problem List    Diagnosis Date Noted  . 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 Tobacco abuse 08/12/2017  . Squamous cell carcinoma of left tonsil ([place]) 08/03/2017    SALISBURY,DONNA 08/31/2017, 1:15 PM  [employer] [place], [place], [address] Phone: [phone]   Fax:  [phone]  Name: GIAVONNI FONDER MRN: [id number] Date of Birth: 10-Dec-1960  Serafina Royals, PT 08/31/17 1:16 PM

## 2017-08-31 NOTE — Progress Notes (Signed)
Patient was seen during head and neck clinic.  56 year old male diagnosed with tonsil cancer.  He is a patient of Dr. Isidore Moos.  Past medical history includes alcohol and tobacco abuse.  Medications include Ativan.  Labs were reviewed.  Height: 67 inches. Weight: 123.2 pounds November 13. Usual body weight: 140 pounds per patient. BMI: 19.3.  Spoke with patient during head and neck clinic. Patient reports he lives with a roommate and cooks for himself. Patient enjoys riding his bike; reports he cannot stay still. He had a total extraction of his teeth about a month ago.  He reports this is healing well.  He is starting to eat soft textures. Patient is scheduled for a feeding tube.  November 26.  Nutrition diagnosis:  Severe malnutrition in the context of acute illness secondary to 5% weight loss over 2 weeks, and moderate depletion of body fat and muscle mass on physical exam and BMI of 19.3 reflecting underweight.  Intervention: Patient educated to consume soft, high-calorie, high-protein foods 6 times daily. Recommended patient consume at least 2 oral nutrition supplements daily. Provided samples of ensure enlive. Educated patient on the importance of feeding tube placement to minimize further weight loss as treatment progresses. Questions answered.  Teach back method used.  Contact information provided.  Monitoring, evaluation, goals: Patient will tolerate increased calories and protein to minimize weight loss throughout treatment.  Next visit: To be scheduled.  **Disclaimer: This note was dictated with voice recognition software. Similar sounding words can inadvertently be transcribed and this note may contain transcription errors which may not have been corrected upon publication of note.**

## 2017-09-01 ENCOUNTER — Encounter: Payer: Self-pay | Admitting: Hematology and Oncology

## 2017-09-01 ENCOUNTER — Encounter: Payer: Self-pay | Admitting: Radiation Oncology

## 2017-09-01 ENCOUNTER — Other Ambulatory Visit: Payer: Self-pay

## 2017-09-01 ENCOUNTER — Encounter: Payer: Self-pay | Admitting: *Deleted

## 2017-09-01 ENCOUNTER — Ambulatory Visit (HOSPITAL_BASED_OUTPATIENT_CLINIC_OR_DEPARTMENT_OTHER): Payer: Medicaid Other

## 2017-09-01 ENCOUNTER — Ambulatory Visit (HOSPITAL_BASED_OUTPATIENT_CLINIC_OR_DEPARTMENT_OTHER): Payer: Medicaid Other | Admitting: Hematology and Oncology

## 2017-09-01 ENCOUNTER — Ambulatory Visit
Admission: RE | Admit: 2017-09-01 | Discharge: 2017-09-01 | Disposition: A | Discharge status: Home / Self Care | Payer: Medicaid Other | Source: Ambulatory Visit | Attending: Radiation Oncology | Admitting: Radiation Oncology

## 2017-09-01 ENCOUNTER — Other Ambulatory Visit (HOSPITAL_BASED_OUTPATIENT_CLINIC_OR_DEPARTMENT_OTHER): Payer: Medicaid Other

## 2017-09-01 VITALS — BP 102/69 | HR 60 | Temp 98.8°F | Resp 17 | Ht 67.0 in | Wt 129.5 lb

## 2017-09-01 VITALS — BP 130/82 | HR 61 | Temp 98.6°F | Resp 18

## 2017-09-01 DIAGNOSIS — C779 Secondary and unspecified malignant neoplasm of lymph node, unspecified: Secondary | ICD-10-CM | POA: Diagnosis not present

## 2017-09-01 DIAGNOSIS — C099 Malignant neoplasm of tonsil, unspecified: Secondary | ICD-10-CM

## 2017-09-01 DIAGNOSIS — Z720 Tobacco use: Secondary | ICD-10-CM

## 2017-09-01 DIAGNOSIS — Z5111 Encounter for antineoplastic chemotherapy: Secondary | ICD-10-CM | POA: Diagnosis not present

## 2017-09-01 DIAGNOSIS — Z510 Encounter for antineoplastic radiation therapy: Secondary | ICD-10-CM | POA: Diagnosis not present

## 2017-09-01 DIAGNOSIS — IMO0002 Reserved for concepts with insufficient information to code with codable children: Secondary | ICD-10-CM

## 2017-09-01 LAB — COMPREHENSIVE METABOLIC PANEL
ALT: 14 U/L (ref 0–55)
ANION GAP: 10 meq/L (ref 3–11)
AST: 28 U/L (ref 5–34)
Albumin: 3.8 g/dL (ref 3.5–5.0)
Alkaline Phosphatase: 68 U/L (ref 40–150)
BUN: 9.8 mg/dL (ref 7.0–26.0)
CALCIUM: 9.8 mg/dL (ref 8.4–10.4)
CHLORIDE: 101 meq/L (ref 98–109)
CO2: 28 mEq/L (ref 22–29)
CREATININE: 0.8 mg/dL (ref 0.7–1.3)
EGFR: >60 mL/min/{1.73_m2} (ref >60)
Glucose: 82 mg/dl (ref 70–140)
POTASSIUM: 4 meq/L (ref 3.5–5.1)
SODIUM: 139 meq/L (ref 136–145)
Total Bilirubin: 0.85 mg/dL (ref 0.20–1.20)
Total Protein: 7.4 g/dL (ref 6.4–8.3)

## 2017-09-01 LAB — CBC WITH DIFFERENTIAL/PLATELET
BASO%: 0.7 % (ref 0.0–2.0)
Basophils Absolute: 0 10*3/uL (ref 0.0–0.1)
EOS ABS: 0.2 10*3/uL (ref 0.0–0.5)
EOS%: 7.1 % — ABNORMAL HIGH (ref 0.0–7.0)
HCT: 38.1 % — ABNORMAL LOW (ref 38.4–49.9)
HGB: 12.4 g/dL — ABNORMAL LOW (ref 13.0–17.1)
LYMPH%: 38.1 % (ref 14.0–49.0)
MCH: 28.7 pg (ref 27.2–33.4)
MCHC: 32.5 g/dL (ref 32.0–36.0)
MCV: 88.4 fL (ref 79.3–98.0)
MONO#: 0.4 10*3/uL (ref 0.1–0.9)
MONO%: 13.2 % (ref 0.0–14.0)
NEUT%: 40.9 % (ref 39.0–75.0)
NEUTROS ABS: 1.3 10*3/uL — AB (ref 1.5–6.5)
Platelets: 127 10*3/uL — ABNORMAL LOW (ref 140–400)
RBC: 4.31 10*6/uL (ref 4.20–5.82)
RDW: 14.1 % (ref 11.0–14.6)
WBC: 3.1 10*3/uL — AB (ref 4.0–10.3)
lymph#: 1.2 10*3/uL (ref 0.9–3.3)

## 2017-09-01 MED ORDER — DEXAMETHASONE 4 MG PO TABS: 8.0000 mg | ORAL_TABLET | Freq: Every day | ORAL | 1 refills | Status: DC

## 2017-09-01 MED ORDER — PALONOSETRON HCL INJECTION 0.25 MG/5ML
0.2500 mg | Freq: Once | INTRAVENOUS | Status: AC
  Administered 2017-09-01: 0.25 mg via INTRAVENOUS

## 2017-09-01 MED ORDER — DIPHENHYDRAMINE HCL 50 MG/ML IJ SOLN
INTRAMUSCULAR | Status: AC
  Filled 2017-09-01: qty 1

## 2017-09-01 MED ORDER — DIPHENHYDRAMINE HCL 50 MG/ML IJ SOLN
50.0000 mg | Freq: Once | INTRAMUSCULAR | Status: AC
  Administered 2017-09-01: 50 mg via INTRAVENOUS

## 2017-09-01 MED ORDER — SODIUM CHLORIDE 0.9 % IV SOLN
20.0000 mg | Freq: Once | INTRAVENOUS | Status: AC
  Administered 2017-09-01: 20 mg via INTRAVENOUS
  Filled 2017-09-01: qty 2

## 2017-09-01 MED ORDER — PROCHLORPERAZINE MALEATE 10 MG PO TABS: 10.0000 mg | ORAL_TABLET | Freq: Four times a day (QID) | ORAL | 1 refills | Status: DC | PRN

## 2017-09-01 MED ORDER — FAMOTIDINE IN NACL 20-0.9 MG/50ML-% IV SOLN
20.0000 mg | Freq: Once | INTRAVENOUS | Status: AC
  Administered 2017-09-01: 20 mg via INTRAVENOUS

## 2017-09-01 MED ORDER — LIDOCAINE-PRILOCAINE 2.5-2.5 % EX CREA: TOPICAL_CREAM | CUTANEOUS | 3 refills | Status: DC

## 2017-09-01 MED ORDER — OXYCODONE HCL 10 MG PO TABS: 10.0000 mg | ORAL_TABLET | ORAL | 0 refills | Status: DC | PRN

## 2017-09-01 MED ORDER — SODIUM CHLORIDE 0.9 % IV SOLN
45.0000 mg/m2 | Freq: Once | INTRAVENOUS | Status: AC
  Administered 2017-09-01: 78 mg via INTRAVENOUS
  Filled 2017-09-01: qty 13

## 2017-09-01 MED ORDER — PALONOSETRON HCL INJECTION 0.25 MG/5ML
INTRAVENOUS | Status: AC
  Filled 2017-09-01: qty 5

## 2017-09-01 MED ORDER — SODIUM CHLORIDE 0.9 % IV SOLN
Freq: Once | INTRAVENOUS | Status: AC
  Administered 2017-09-01: 10:00:00 via INTRAVENOUS

## 2017-09-01 MED ORDER — ONDANSETRON HCL 8 MG PO TABS: 8.0000 mg | ORAL_TABLET | Freq: Two times a day (BID) | ORAL | 1 refills | Status: DC | PRN

## 2017-09-01 MED ORDER — SODIUM CHLORIDE 0.9 % IV SOLN
224.8000 mg | Freq: Once | INTRAVENOUS | Status: AC
  Administered 2017-09-01: 220 mg via INTRAVENOUS
  Filled 2017-09-01: qty 22

## 2017-09-01 MED ORDER — FAMOTIDINE IN NACL 20-0.9 MG/50ML-% IV SOLN
INTRAVENOUS | Status: AC
  Filled 2017-09-01: qty 50

## 2017-09-01 NOTE — Therapy (Signed)
[facility] [address] [place], [place], [address] Phone: [phone]   Fax:  [phone]  Speech Language Pathology Evaluation  Patient Details  Name: [name] MRN: [id number] Date of Birth: [birth year] Referring Provider: Eppie Gibson, MD   Encounter Date: 08/31/2017  End of Session - 09/01/17 1654    Visit Number  1    Number of Visits  4    Date for SLP Re-Evaluation  01/13/18 However expect Medicaid auth to go through 10-18-17    Authorization Type  Medicaid    SLP Start Time  7616    SLP Stop Time   1100    SLP Time Calculation (min)  45 min    Activity Tolerance  Patient tolerated treatment well       Past Medical History:  Diagnosis Date  . Cancer [facility])    left tonsil cancer    Past Surgical History:  Procedure Laterality Date  . tonsil biopsy     left tonsil    There were no vitals filed for this visit.  Subjective Assessment - 08/31/17 1005    Subjective  Pt states "burning" when swallowing on lt side neck. No difficulty with oral or pharyngeal stages reported.    Currently in Pain?  No/denies         SLP Evaluation [facility] - 09/01/17 1652      SLP Visit Information   SLP Received On  08/31/17    Referring Provider  Eppie Gibson, MD    Onset Date  Summer 2018 "back in the summer" (pt)    Medical Diagnosis  lt tonsil cancer      Prior Functional Status   Cognitive/Linguistic Baseline  Within functional limits      Cognition   Overall Cognitive Status  Within Functional Limits for tasks assessed      Auditory Comprehension   Overall Auditory Comprehension  Appears within functional limits for tasks assessed      Verbal Expression   Overall Verbal Expression  Appears within functional limits for tasks assessed      Oral Motor/Sensory Function   Overall Oral Motor/Sensory Function  Appears within functional limits for tasks assessed      Motor Speech   Overall Motor  Speech  Appears within functional limits for tasks assessed       Pt currently tolerates soft/regular diet with thin liquids, however reports "burning" and points to lt pharyngeal cavity. POs: Pt ate bites of [place] sandwich without overt s/s aspiration. Thyroid elevation appeared adequate, and swallows appeared timely, given pt endentulous. Oral residue noted as WNL. Pt's swallow deemed WFL at this time.   Because data states the risk for dysphagia during and after radiation treatment is high due to undergoing radiation tx, SLP taught pt about the possibility of reduced/limited ability for PO intake during rad tx. SLP encouraged pt to continue swallowing POs as far into rad tx as possible, even ingesting POs and/or completing HEP shortly after administration of pain meds.   SLP educated pt re: changes to swallowing musculature after rad tx, and why adherence to dysphagia HEP provided today and PO consumption was necessary to inhibit muscular disuse atrophy and to reduce muscle fibrosis following rad tx. Pt demonstrated understanding of these things to SLP.    SLP then developed a HEP for pt and pt was instructed how to perform exercises involving lingual, vocal, and pharyngeal strengthening. SLP performed each exercise and pt return demonstrated each exercise. SLP  ensured pt performance was correct prior to moving on to next exercise. Pt was instructed to complete this program 2-3 times a day, 6-7 days/week until 6 months after his or her last rad tx, then x2 a week after that.                 SLP Education - 09/01/17 1653    Education provided  Yes    Education Details  HEP, late effects head/neck radiation on swallowing    Person(s) Educated  Patient    Methods  Explanation;Demonstration;Handout    Comprehension  Verbalized understanding;Returned demonstration;Need further instruction         SLP Long Term Goals - 09/01/17 1656      SLP LONG TERM GOAL #1   Title  pt will  complete HEP with modified independence over two sessions     Baseline  total A    Time  2    Period  -- visits    Status  New      SLP LONG TERM GOAL #2   Title  pt will tell SLP why he is completing HEP     Baseline  total A    Time  2    Period  -- visits    Status  New      SLP LONG TERM GOAL #3   Title  pt will tell how a food journal can expedite return to more normalized diet    Time  2    Period  -- visits    Status  New       Plan - 09/01/17 1655    Clinical Impression Statement  Pt with oropharyngeal swallowing essentially WNL, however the probability of swallowing difficulty increases dramatically with the initiation of chemo and radiation therapy. Pt will need to be followed by SLP for regular assessment of accurate HEP completion as well as for safety with POs both during and following treatment/s.    Speech Therapy Frequency  -- approx once per 4 weeks    Duration  -- 2 visits until 10-18-17    Treatment/Interventions  Aspiration precaution training;Pharyngeal strengthening exercises;Diet toleration management by SLP;Trials of upgraded texture/liquids;Internal/external aids;Patient/family education;Compensatory strategies;Cueing hierarchy;Environmental controls;SLP instruction and feedback    Potential to Achieve Goals  Good       Patient will benefit from skilled therapeutic intervention in order to improve the following deficits and impairments:   Dysphagia, unspecified type    Problem List Patient Active Problem List   Diagnosis Date Noted  . Alcohol withdrawal syndrome without complication ([place]) 56/70/1410  . Tobacco abuse 08/12/2017  . 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

## 2017-09-01 NOTE — Progress Notes (Signed)
Per Dr. Lebron Conners, ok to treat with ANC of 1.3

## 2017-09-01 NOTE — Patient Instructions (Signed)
SWALLOWING EXERCISES Do these 6 of the 7 days per week until 6 months after your last day of radiation, then 3 times per week afterwards  1. Effortful Swallows - Press your tongue against the roof of your mouth for 3 seconds, then squeeze          the muscles in your neck while you swallow your saliva or a sip of water - Repeat 20 times, 2-3 times a day, and use whenever you eat or drink  2. Masako Swallow - swallow with your tongue sticking out - Stick tongue out past your teeth and gently bite tongue with your teeth - Swallow, while holding your tongue with your teeth - Repeat 20 times, 2-3 times a day *use a wet spoon if your mouth gets dry*  3. Shaker Exercise - head lift - Lie flat on your back in your bed or on a couch without pillows - Raise your head and look at your feet - KEEP YOUR SHOULDERS DOWN - HOLD FOR 45-60 SECONDS, then lower your head back down - Repeat 3 times, 2-3 times a day  4. Mendelsohn Maneuver - "half swallow" exercise - Start to swallow, and keep your Adam's apple up by squeezing hard with the            muscles of the throat - Hold the squeeze for 5-7 seconds and then relax - Repeat 20 times, 2-3 times a day *use a wet spoon if your mouth gets dry*  5. Breath Hold - Say "HUH!" loudly, then hold your breath for 3 seconds at your voice box - Repeat 20 times, 2-3 times a day  6. Chin pushback - Open your mouth  - Place your fist UNDER your chin near your neck, and push back with your fist for 5 seconds - Repeat 10 times, 2-3 times a day

## 2017-09-01 NOTE — Progress Notes (Signed)
Financial Counseling--spoke with patient about the grant we have available--also gave him a bus pass for 30 days

## 2017-09-01 NOTE — Patient Instructions (Signed)
[facility] Discharge Instructions for Patients Receiving Chemotherapy  Today you received the following chemotherapy agents Paclitaxel (TAXOL) and Carboplatin (PARAPLATIN).  To help prevent nausea and vomiting after your treatment, we encourage you to take your nausea medication.   If you develop nausea and vomiting that is not controlled by your nausea medication, call the clinic.   BELOW ARE SYMPTOMS THAT SHOULD BE REPORTED IMMEDIATELY:  *FEVER GREATER THAN 100.5 F  *CHILLS WITH OR WITHOUT FEVER  NAUSEA AND VOMITING THAT IS NOT CONTROLLED WITH YOUR NAUSEA MEDICATION  *UNUSUAL SHORTNESS OF BREATH  *UNUSUAL BRUISING OR BLEEDING  TENDERNESS IN MOUTH AND THROAT WITH OR WITHOUT PRESENCE OF ULCERS  *URINARY PROBLEMS  *BOWEL PROBLEMS  UNUSUAL RASH Items with * indicate a potential emergency and should be followed up as soon as possible.  Feel free to call the clinic should you have any questions or concerns. The clinic phone number is [phone].  Please show the [facility] at check-in to the Emergency Department and triage nurse.  Carboplatin injection What is this medicine? CARBOPLATIN (KAR boe pla tin) is a chemotherapy drug. It targets fast dividing cells, like cancer cells, and causes these cells to die. This medicine is used to treat ovarian cancer and many other cancers. This medicine may be used for other purposes; ask your health care provider or pharmacist if you have questions. COMMON BRAND NAME(S): Paraplatin What should I tell my health care provider before I take this medicine? They need to know if you have any of these conditions: -blood disorders -hearing problems -kidney disease -recent or ongoing radiation therapy -an unusual or allergic reaction to carboplatin, cisplatin, other chemotherapy, other medicines, foods, dyes, or preservatives -pregnant or trying to get pregnant -breast-feeding How should I use this medicine? This drug  is usually given as an infusion into a vein. It is administered in a hospital or clinic by a specially trained health care professional. Talk to your pediatrician regarding the use of this medicine in children. Special care may be needed. Overdosage: If you think you have taken too much of this medicine contact a poison control center or emergency room at once. NOTE: This medicine is only for you. Do not share this medicine with others. What if I miss a dose? It is important not to miss a dose. Call your doctor or health care professional if you are unable to keep an appointment. What may interact with this medicine? -medicines for seizures -medicines to increase blood counts like filgrastim, pegfilgrastim, sargramostim -some antibiotics like amikacin, gentamicin, neomycin, streptomycin, tobramycin -vaccines Talk to your doctor or health care professional before taking any of these medicines: -acetaminophen -aspirin -ibuprofen -ketoprofen -naproxen This list may not describe all possible interactions. Give your health care provider a list of all the medicines, herbs, non-prescription drugs, or dietary supplements you use. Also tell them if you smoke, drink alcohol, or use illegal drugs. Some items may interact with your medicine. What should I watch for while using this medicine? Your condition will be monitored carefully while you are receiving this medicine. You will need important blood work done while you are taking this medicine. This drug may make you feel generally unwell. This is not uncommon, as chemotherapy can affect healthy cells as well as cancer cells. Report any side effects. Continue your course of treatment even though you feel ill unless your doctor tells you to stop. In some cases, you may be given additional medicines to help with side effects.  Follow all directions for their use. Call your doctor or health care professional for advice if you get a fever, chills or sore  throat, or other symptoms of a cold or flu. Do not treat yourself. This drug decreases your body's ability to fight infections. Try to avoid being around people who are sick. This medicine may increase your risk to bruise or bleed. Call your doctor or health care professional if you notice any unusual bleeding. Be careful brushing and flossing your teeth or using a toothpick because you may get an infection or bleed more easily. If you have any dental work done, tell your dentist you are receiving this medicine. Avoid taking products that contain aspirin, acetaminophen, ibuprofen, naproxen, or ketoprofen unless instructed by your doctor. These medicines may hide a fever. Do not become pregnant while taking this medicine. Women should inform their doctor if they wish to become pregnant or think they might be pregnant. There is a potential for serious side effects to an unborn child. Talk to your health care professional or pharmacist for more information. Do not breast-feed an infant while taking this medicine. What side effects may I notice from receiving this medicine? Side effects that you should report to your doctor or health care professional as soon as possible: -allergic reactions like skin rash, itching or hives, swelling of the face, lips, or tongue -signs of infection - fever or chills, cough, sore throat, pain or difficulty passing urine -signs of decreased platelets or bleeding - bruising, pinpoint red spots on the skin, black, tarry stools, nosebleeds -signs of decreased red blood cells - unusually weak or tired, fainting spells, lightheadedness -breathing problems -changes in hearing -changes in vision -chest pain -high blood pressure -low blood counts - This drug may decrease the number of white blood cells, red blood cells and platelets. You may be at increased risk for infections and bleeding. -nausea and vomiting -pain, swelling, redness or irritation at the injection site -pain,  tingling, numbness in the hands or feet -problems with balance, talking, walking -trouble passing urine or change in the amount of urine Side effects that usually do not require medical attention (report to your doctor or health care professional if they continue or are bothersome): -hair loss -loss of appetite -metallic taste in the mouth or changes in taste This list may not describe all possible side effects. Call your doctor for medical advice about side effects. You may report side effects to FDA at 1-800-FDA-1088. Where should I keep my medicine? This drug is given in a hospital or clinic and will not be stored at home. NOTE: This sheet is a summary. It may not cover all possible information. If you have questions about this medicine, talk to your doctor, pharmacist, or health care provider.  2018 Elsevier/Gold Standard (2008-01-10 14:38:05)   Paclitaxel injection What is this medicine? PACLITAXEL (PAK li TAX el) is a chemotherapy drug. It targets fast dividing cells, like cancer cells, and causes these cells to die. This medicine is used to treat ovarian cancer, breast cancer, and other cancers. This medicine may be used for other purposes; ask your health care provider or pharmacist if you have questions. COMMON BRAND NAME(S): Onxol, Taxol What should I tell my health care provider before I take this medicine? They need to know if you have any of these conditions: -blood disorders -irregular heartbeat -infection (especially a virus infection such as chickenpox, cold sores, or herpes) -liver disease -previous or ongoing radiation therapy -an unusual or allergic  reaction to paclitaxel, alcohol, polyoxyethylated castor oil, other chemotherapy agents, other medicines, foods, dyes, or preservatives -pregnant or trying to get pregnant -breast-feeding How should I use this medicine? This drug is given as an infusion into a vein. It is administered in a hospital or clinic by a  specially trained health care professional. Talk to your pediatrician regarding the use of this medicine in children. Special care may be needed. Overdosage: If you think you have taken too much of this medicine contact a poison control center or emergency room at once. NOTE: This medicine is only for you. Do not share this medicine with others. What if I miss a dose? It is important not to miss your dose. Call your doctor or health care professional if you are unable to keep an appointment. What may interact with this medicine? Do not take this medicine with any of the following medications: -disulfiram -metronidazole This medicine may also interact with the following medications: -cyclosporine -diazepam -ketoconazole -medicines to increase blood counts like filgrastim, pegfilgrastim, sargramostim -other chemotherapy drugs like cisplatin, doxorubicin, epirubicin, etoposide, teniposide, vincristine -quinidine -testosterone -vaccines -verapamil Talk to your doctor or health care professional before taking any of these medicines: -acetaminophen -aspirin -ibuprofen -ketoprofen -naproxen This list may not describe all possible interactions. Give your health care provider a list of all the medicines, herbs, non-prescription drugs, or dietary supplements you use. Also tell them if you smoke, drink alcohol, or use illegal drugs. Some items may interact with your medicine. What should I watch for while using this medicine? Your condition will be monitored carefully while you are receiving this medicine. You will need important blood work done while you are taking this medicine. This medicine can cause serious allergic reactions. To reduce your risk you will need to take other medicine(s) before treatment with this medicine. If you experience allergic reactions like skin rash, itching or hives, swelling of the face, lips, or tongue, tell your doctor or health care professional right away. In  some cases, you may be given additional medicines to help with side effects. Follow all directions for their use. This drug may make you feel generally unwell. This is not uncommon, as chemotherapy can affect healthy cells as well as cancer cells. Report any side effects. Continue your course of treatment even though you feel ill unless your doctor tells you to stop. Call your doctor or health care professional for advice if you get a fever, chills or sore throat, or other symptoms of a cold or flu. Do not treat yourself. This drug decreases your body's ability to fight infections. Try to avoid being around people who are sick. This medicine may increase your risk to bruise or bleed. Call your doctor or health care professional if you notice any unusual bleeding. Be careful brushing and flossing your teeth or using a toothpick because you may get an infection or bleed more easily. If you have any dental work done, tell your dentist you are receiving this medicine. Avoid taking products that contain aspirin, acetaminophen, ibuprofen, naproxen, or ketoprofen unless instructed by your doctor. These medicines may hide a fever. Do not become pregnant while taking this medicine. Women should inform their doctor if they wish to become pregnant or think they might be pregnant. There is a potential for serious side effects to an unborn child. Talk to your health care professional or pharmacist for more information. Do not breast-feed an infant while taking this medicine. Men are advised not to father a child while  receiving this medicine. This product may contain alcohol. Ask your pharmacist or healthcare provider if this medicine contains alcohol. Be sure to tell all healthcare providers you are taking this medicine. Certain medicines, like metronidazole and disulfiram, can cause an unpleasant reaction when taken with alcohol. The reaction includes flushing, headache, nausea, vomiting, sweating, and increased  thirst. The reaction can last from 30 minutes to several hours. What side effects may I notice from receiving this medicine? Side effects that you should report to your doctor or health care professional as soon as possible: -allergic reactions like skin rash, itching or hives, swelling of the face, lips, or tongue -low blood counts - This drug may decrease the number of white blood cells, red blood cells and platelets. You may be at increased risk for infections and bleeding. -signs of infection - fever or chills, cough, sore throat, pain or difficulty passing urine -signs of decreased platelets or bleeding - bruising, pinpoint red spots on the skin, black, tarry stools, nosebleeds -signs of decreased red blood cells - unusually weak or tired, fainting spells, lightheadedness -breathing problems -chest pain -high or low blood pressure -mouth sores -nausea and vomiting -pain, swelling, redness or irritation at the injection site -pain, tingling, numbness in the hands or feet -slow or irregular heartbeat -swelling of the ankle, feet, hands Side effects that usually do not require medical attention (report to your doctor or health care professional if they continue or are bothersome): -bone pain -complete hair loss including hair on your head, underarms, pubic hair, eyebrows, and eyelashes -changes in the color of fingernails -diarrhea -loosening of the fingernails -loss of appetite -muscle or joint pain -red flush to skin -sweating This list may not describe all possible side effects. Call your doctor for medical advice about side effects. You may report side effects to FDA at 1-800-FDA-1088. Where should I keep my medicine? This drug is given in a hospital or clinic and will not be stored at home. NOTE: This sheet is a summary. It may not cover all possible information. If you have questions about this medicine, talk to your doctor, pharmacist, or health care provider.  2018  Elsevier/Gold Standard (2015-08-06 19:58:00)

## 2017-09-01 NOTE — Progress Notes (Signed)
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  Imaging demonstrates locally advanced disease without evidence of metastatic disease in the lymph nodes by PET/CT.  No evidence of distal metastasis.  Patient undergone dental evaluation and surgery due to poor dentition.  Presently, [name] is ready to proceed with definitive therapy.  Due to locally advanced disease, our preference is to proceed with radiation with concurrent chemotherapy.  The treatment options have been reviewed with the patient.  [name] does have pre-existing hearing loss and high-dose cisplatin is unlikely to be well tolerated.  With that in mind, combination of carboplatin and paclitaxel administered weekly will be used.  The medications, their dosing, schedule, potential complications and side effects have been discussed with the patient in detail.  [name] agrees to proceed with the treatment as recommended.  Plan:  --Consult interventional radiology for Infuse-a-Port placement and PEG tube placement. --Chemotherapy education --Return to my clinic in 2 weeks for initiation of chemoradiotherapy with carboplatin and paclitaxel.  Voice recognition software was used and creation of this note. Despite my best effort at editing the text, some misspelling/errors may have occurred.  Orders Placed This Encounter  Procedures  . 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 . The patient knows to call the clinic with any problems, questions or concerns.  This note was electronically signed.     History of Presenting Illness [name] is a 56 y.o. male followed in the [facility] for diagnosis of squamous cell carcinoma of the left tonsil. Patient initially presented with sore throat onset around the middle of June. [name] first presented to the Urgent Care on 04/26/17 after 3 weeks of symptoms. The sore throat was also accompanied by the globus sensation in the neck. [name] was treated for uvulitis initially, wihtout significant improvement despite two additional visits to the Urgent Care. On his last visit with the Urgent CareENT referral was made.  The patient saw Dr. Janace Hoard from [facility] and underwent laryngoscopy on 07/16/17 with biopsy which confirmed presence of squamous cell carcinoma. Please see results of additional evaluation in the oncological history below. At the present time, additional assessment is pending. Patient was seen by dentistry and multiple dental extractions were recommended. [name] is scheduled for surgical removal of his teeth on 08/09/17.   Current symptoms include no fevers, chills, night sweats. 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 Denies chest pain, shortness of breath, or cough. No nausea, vomiting, abdominal pain, diarrhea, or constipation. No urinary or neurological symptoms.  Patient is an active tobacco user, smoking at least 1ppd x 30 years. In addition, patient is habitual daily alcohol userdrinking as much as 1 beer (a 40oz) and 1 pint of gin per day. Patient has been treated for alcohol dependency in the past. His own presentation trace some degree of denial/attempts at minimizing his alcohol dependency as initially [name] only mentioned drinking 1 beer per day. His family has corrected him, and [name] acknowledged a higher level  of alcohol use. In the past, [name] has stopped drinking, but [name] does get withdrawal symptoms rapidly.  Patient returns to the clinic today PET/CT.  [name] denies any new  complaints.   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 There are foci suspicious but not definitive for invasion. p16 immunohistochemistry is negative in the neoplastic cells. FINAL DIAGNOSIS       07/27/2017 Imaging    CT neck: Indistinct tonsillar enlargement on the left in the region of left tonsillar mass biopsy. This could be a combination of post biopsy edema and residual mass. Margins are not discrete and accurate measurement cannot be performed. This is estimated at about 3 cm in size. No evidence of metastatic adenopathy.      08/16/2017 Imaging    PET-CT: Hypermetabolic disease in the left palatal fossa.  Indeterminate level 3 lymph node which may be reactive considering recent dental extractions.       Medical History: Past Medical History:  Diagnosis Date  . Cancer [facility])    left tonsil cancer    Surgical History: Past Surgical History:  Procedure Laterality Date  . tonsil biopsy     left tonsil    Family History: Family History  Problem Relation Age of Onset  . Diabetes Mother     Social History: Social History   Socioeconomic History  . Marital status: Single    Spouse name: Not on file  . Number of children: 1  . Years of education: Not on file  . Highest education level: Not on file  Social Needs  . Financial resource strain: Not on file  . Food insecurity - worry: Not on file  . Food insecurity - inability: Not on file  . Transportation needs - medical: Not on file  . Transportation needs - non-medical: Not on file  Occupational History  . Occupation: Disabled  Tobacco Use  . Smoking status: Current Every Day Smoker    Packs/day: 1.00    Years: 30.00    Pack  years: 30.00  . Smokeless tobacco: Never Used  Substance and Sexual Activity  . Alcohol use: Yes    Alcohol/week: 4.2 oz    Types: 7 Cans of beer per week    Comment: 1 pint daily   . Drug use: No    Comment: [name] has a past history of cocaine abuse, [name] denies current use  . Sexual activity: Not on file  Other Topics Concern  . Not on file  Social History Narrative  . Not on file    Allergies: No Known Allergies  Medications:  Current Outpatient Medications  Medication Sig Dispense Refill  . acetaminophen (TYLENOL) 325 MG tablet Take 650 mg by mouth every 6 (six) hours as needed for moderate pain.     Marland Kitchen dexamethasone (DECADRON) 4 MG tablet Take 2 tablets (8 mg total) daily by mouth. Start the day after chemotherapy for 2 days. 30 tablet 1  . lidocaine-prilocaine (EMLA) cream Apply to affected area once 30 g 3  . LORazepam (ATIVAN) 0.5 MG tablet Take 1 tablet (0.5 mg total) every 6 (six) hours as needed by mouth (Nausea or vomiting). (Patient not taking: Reported on 09/01/2017) 30 tablet 0  . nicotine (NICODERM  CQ - DOSED IN MG/24 HOURS) 21 mg/24hr patch Place 1 patch (21 mg total) onto the skin daily. 28 patch 0  . nicotine polacrilex (NICOTINE MINI) 4 MG lozenge Take 1 lozenge (4 mg total) by mouth as needed for smoking cessation. 100 tablet 0  . ondansetron (ZOFRAN) 8 MG tablet Take 1 tablet (8 mg total) 2 (two) times daily as needed by mouth for refractory nausea / vomiting. Start on day 3 after chemo. 30 tablet 1  . Oxycodone HCl 10 MG TABS Take 1 tablet (10 mg total) every 4 (four) hours as needed by mouth. 50 tablet 0  . prochlorperazine (COMPAZINE) 10 MG tablet Take 1 tablet (10 mg total) every 6 (six) hours as needed by mouth (Nausea or vomiting). 30 tablet 1   No current facility-administered medications for this visit.     Review of Systems: Review of Systems  All other systems reviewed and are negative.    PHYSICAL EXAMINATION Blood pressure 117/75, pulse 81,  temperature 99.3 F (37.4 C), temperature source Oral, resp. rate 17, height 5\' 7"  (1.702 m), weight 130 lb 9.6 oz (59.2 kg), SpO2 97 %.  ECOG PERFORMANCE STATUS: 1 - Symptomatic but completely ambulatory  Physical Exam  Constitutional: [name] is oriented to person, place, and time and well-developed, well-nourished, and in no distress. No distress.  HENT:  Head: Normocephalic and atraumatic.  Mouth/Throat: Uvula is midline, oropharynx is clear and moist and mucous membranes are normal. [name] does not have dentures. Oral lesions present. Abnormal dentition. Dental caries present. No dental abscesses or uvula swelling. No oropharyngeal exudate or tonsillar abscesses.  Eyes: Conjunctivae and EOM are normal. Pupils are equal, round, and reactive to light. No scleral icterus.  Neck: No thyromegaly present.  Cardiovascular: Normal rate, regular rhythm, normal heart sounds and intact distal pulses.  No murmur heard. Pulmonary/Chest: Effort normal and breath sounds normal. No respiratory distress. [name] has no wheezes. [name] has no rales.  Abdominal: Soft. Bowel sounds are normal. [name] exhibits no distension and no mass. There is no tenderness. There is no rebound and no guarding.  Musculoskeletal: Normal range of motion. [name] exhibits no edema.  Lymphadenopathy:    [name] has no cervical adenopathy.  Neurological: [name] is alert and oriented to person, place, and time. [name] has normal reflexes. No cranial nerve deficit. Coordination normal.  Skin: Skin is warm and dry. No rash noted. [name] is not diaphoretic. No erythema.     LABORATORY DATA: I have personally reviewed the data as listed: Hospital Outpatient Visit on 08/16/2017  Component Date Value Ref Range Status  . Glucose-Capillary 08/16/2017 117* 65 - 99 mg/dL Final       Ardath Sax, MD

## 2017-09-01 NOTE — Assessment & Plan Note (Signed)
56 y.o. with left tonsillar carcinoma.  Imaging demonstrates locally advanced disease without evidence of metastatic disease in the lymph nodes by PET/CT.  No evidence of distal metastasis.  Patient undergone dental evaluation and surgery due to poor dentition.  Presently, he is ready to proceed with definitive therapy.  Due to locally advanced disease, our preference is to proceed with radiation with concurrent chemotherapy.  The treatment options have been reviewed with the patient.  He does have pre-existing hearing loss and high-dose cisplatin is unlikely to be well tolerated.  With that in mind, combination of carboplatin and paclitaxel administered weekly will be used.  The medications, their dosing, schedule, potential complications and side effects have been discussed with the patient in detail.  He agrees to proceed with the treatment as recommended.  Plan:  --Consult interventional radiology for Infuse-a-Port placement and PEG tube placement. --Chemotherapy education --Return to my clinic in 2 weeks for initiation of chemoradiotherapy with carboplatin and paclitaxel.

## 2017-09-02 ENCOUNTER — Telehealth: Payer: Self-pay | Admitting: *Deleted

## 2017-09-02 ENCOUNTER — Ambulatory Visit
Admission: RE | Admit: 2017-09-02 | Discharge: 2017-09-02 | Disposition: A | Discharge status: Home / Self Care | Payer: Medicaid Other | Source: Ambulatory Visit | Attending: Radiation Oncology | Admitting: Radiation Oncology

## 2017-09-02 DIAGNOSIS — Z510 Encounter for antineoplastic radiation therapy: Secondary | ICD-10-CM | POA: Diagnosis not present

## 2017-09-02 LAB — PHOSPHORUS: PHOSPHORUS: 3.7 mg/dL (ref 2.5–4.5)

## 2017-09-02 NOTE — Telephone Encounter (Signed)
Chemo follow up: Called pt, he denied any side effects. Instructed him to push PO fluids. Reviewed antiemetic instructions. Pt had questions about next chemo appointments. Appointment times given. Encouraged him to call office as needs arise.

## 2017-09-02 NOTE — Telephone Encounter (Signed)
-----   Message from Johann Capers, RN sent at 09/01/2017  2:13 PM EST ----- Regarding: [place] First-Time Pt received first-time taxol/carbo. Tolerated well.

## 2017-09-03 ENCOUNTER — Other Ambulatory Visit (HOSPITAL_COMMUNITY): Payer: Self-pay

## 2017-09-03 ENCOUNTER — Ambulatory Visit
Admission: RE | Admit: 2017-09-03 | Discharge: 2017-09-03 | Disposition: A | Discharge status: Home / Self Care | Payer: Medicaid Other | Source: Ambulatory Visit | Attending: Radiation Oncology | Admitting: Radiation Oncology

## 2017-09-03 ENCOUNTER — Encounter: Payer: Self-pay | Admitting: *Deleted

## 2017-09-03 ENCOUNTER — Ambulatory Visit (HOSPITAL_COMMUNITY): Payer: Medicaid Other

## 2017-09-03 ENCOUNTER — Telehealth: Payer: Self-pay | Admitting: *Deleted

## 2017-09-03 DIAGNOSIS — Z510 Encounter for antineoplastic radiation therapy: Secondary | ICD-10-CM | POA: Diagnosis not present

## 2017-09-03 NOTE — Telephone Encounter (Signed)
Oncology Nurse Navigator Documentation  Spoke with [name], [facility] requested delivery of Start of Care Bolus Feeding Kit to [facility] Short Stay in support of patient's 11/26 PEG placement.  She voiced understanding.  Gayleen Orem, RN, BSN, [place] Oncology Nurse [place] at [place] [phone]

## 2017-09-03 NOTE — Progress Notes (Signed)
Oncology Nurse Navigator Documentation  Met with [name] following RT to provide PEG education.  He was accompanied by his sister, [name].  He noted a friend had a PEG so he has some familiarity.  Using  PEG teaching device   and Teach Back, provided education for PEG use and care, including: hand hygiene, gravity bolus administration of daily water flushes and nutritional supplement, fluids and medications; care of tube insertion site including daily dressing change and cleaning; S&S of infection.  [name] correctly verbalized dressing change and cleaning procedures, provided correct return demonstration of gravity administration of water and dressing change.  I provided written instructions for PEG flushing/dressing change in support of verbal instruction.  Described contents of Start of Care Bolus Feeding Kit to be provided by [place] day of PEG placement.  He voiced understanding I will be available for ongoing PEG support.    Gayleen Orem, RN, BSN, [place] Oncology Nurse [place] at [place] [phone]

## 2017-09-05 ENCOUNTER — Other Ambulatory Visit: Payer: Self-pay | Admitting: Radiation Oncology

## 2017-09-05 ENCOUNTER — Ambulatory Visit
Admission: RE | Admit: 2017-09-05 | Discharge: 2017-09-05 | Disposition: A | Discharge status: Home / Self Care | Payer: Medicaid Other | Source: Ambulatory Visit | Attending: Radiation Oncology | Admitting: Radiation Oncology

## 2017-09-05 DIAGNOSIS — C090 Malignant neoplasm of tonsillar fossa: Secondary | ICD-10-CM

## 2017-09-05 DIAGNOSIS — Z510 Encounter for antineoplastic radiation therapy: Secondary | ICD-10-CM | POA: Diagnosis not present

## 2017-09-05 DIAGNOSIS — C099 Malignant neoplasm of tonsil, unspecified: Secondary | ICD-10-CM

## 2017-09-05 MED ORDER — SONAFINE EX EMUL: 1.0000 "application " | Freq: Two times a day (BID) | CUTANEOUS | Status: DC

## 2017-09-05 MED ORDER — LIDOCAINE VISCOUS 2 % MT SOLN: OROMUCOSAL | 5 refills | Status: DC

## 2017-09-05 MED ORDER — SONAFINE EX EMUL
1.0000 "application " | Freq: Two times a day (BID) | CUTANEOUS | Status: DC
  Administered 2017-09-05: 1 via TOPICAL

## 2017-09-05 NOTE — Progress Notes (Addendum)
Oncology Nurse Navigator Documentation  Met with [name] during beginning of first infusion, he reported toleration of procedure, denied concerns. Later escorted him from Infusion to [facility] for first RT.  His sister Rise Paganini accompanied him. S/p tmt, he indicated "not to bad", denied concerns.   I reviewed again procedure for RadOnc registration, arrival to Radiation Waiting, arrival to [facility] and preparation for tmt.   Obtained gas card for sister from [facility] as she is providing regular transportation.  She acknowledged his receipt of 30-day bus pass, indicated he will be using it for some [place] appts. He voiced understanding of guidance provided.  Gayleen Orem, RN, BSN, [place] Oncology Nurse [place] at [place] [phone]

## 2017-09-05 NOTE — Progress Notes (Signed)
A user error has taken place: encounter opened in error, closed for administrative reasons.

## 2017-09-05 NOTE — Progress Notes (Signed)
Pt here for patient teaching.  Pt given Radiation and You booklet, Managing Acute Radiation Side Effects for Head and Neck Cancer handout, skin care instructions and Sonafine.  Reviewed areas of pertinence such as fatigue, mouth changes, skin changes, throat changes, cough and taste changes . Pt able to give teach back of drink plenty of water,apply Sonafine bid and avoid applying anything to skin within 4 hours of treatment. Pt verbalizes understanding of information given and will contact nursing with any questions or concerns.     [email or web address]

## 2017-09-05 NOTE — Progress Notes (Signed)
Oncology Nurse Navigator Documentation  Met with [name] during H&N [place].  He was unaccompanied.  Provided verbal and written overview of [place], the clinicians who will be seeing him, encouraged him to ask questions during his time with them.  He was seen by Nutrition, SLP, PT, SW.  Previously seen by [facility].  He received one-on-one chemo Education during course of [place] visit.  Spoke with him at end of [facility], he denied questions/concerns.    I provided 2 copies of Epic appt calendar, asked that he provide copy to sister [name]. I encouraged him to call me with needs/concerns.  Gayleen Orem, RN, BSN, [place] at [place] [phone]

## 2017-09-06 ENCOUNTER — Ambulatory Visit (HOSPITAL_COMMUNITY): Payer: Self-pay | Admitting: Dentistry

## 2017-09-06 ENCOUNTER — Ambulatory Visit
Admission: RE | Admit: 2017-09-06 | Discharge: 2017-09-06 | Disposition: A | Discharge status: Home / Self Care | Payer: Medicaid Other | Source: Ambulatory Visit | Attending: Radiation Oncology | Admitting: Radiation Oncology

## 2017-09-06 DIAGNOSIS — Z510 Encounter for antineoplastic radiation therapy: Secondary | ICD-10-CM | POA: Diagnosis not present

## 2017-09-07 ENCOUNTER — Telehealth: Payer: Self-pay | Admitting: *Deleted

## 2017-09-07 ENCOUNTER — Encounter: Payer: Self-pay | Admitting: *Deleted

## 2017-09-07 ENCOUNTER — Ambulatory Visit
Admission: RE | Admit: 2017-09-07 | Discharge: 2017-09-07 | Disposition: A | Discharge status: Home / Self Care | Payer: Medicaid Other | Source: Ambulatory Visit | Attending: Radiation Oncology | Admitting: Radiation Oncology

## 2017-09-07 DIAGNOSIS — C099 Malignant neoplasm of tonsil, unspecified: Secondary | ICD-10-CM

## 2017-09-07 DIAGNOSIS — Z510 Encounter for antineoplastic radiation therapy: Secondary | ICD-10-CM | POA: Diagnosis not present

## 2017-09-07 NOTE — Telephone Encounter (Signed)
A user error has taken place: encounter opened in error, closed for administrative reasons.

## 2017-09-07 NOTE — Progress Notes (Signed)
Oncology Nurse Navigator Documentation  Met with Mr. Lott prior to RT to provide barium sulfate prep obtained from [facility] for next Monday's PEG placement. Explained purpose, reviewed instructions with him, he voiced understanding. Called his sister Rise Paganini to provide information as further reinforcement.  Gayleen Orem, RN, BSN, [place] Oncology Nurse [place] at [place] [phone] -

## 2017-09-07 NOTE — Progress Notes (Signed)
Nutrition  Patient did not show up for nutrition appointment today at 2:30pm.  Will plan follow-up on November 27.  Twanda Stakes B. Zenia Resides, [place], [place] Registered Dietitian [phone] (pager)

## 2017-09-08 ENCOUNTER — Ambulatory Visit (HOSPITAL_BASED_OUTPATIENT_CLINIC_OR_DEPARTMENT_OTHER): Payer: Medicaid Other | Admitting: Hematology and Oncology

## 2017-09-08 ENCOUNTER — Ambulatory Visit (HOSPITAL_BASED_OUTPATIENT_CLINIC_OR_DEPARTMENT_OTHER): Payer: Medicaid Other

## 2017-09-08 ENCOUNTER — Other Ambulatory Visit (HOSPITAL_BASED_OUTPATIENT_CLINIC_OR_DEPARTMENT_OTHER): Payer: Medicaid Other

## 2017-09-08 ENCOUNTER — Ambulatory Visit
Admission: RE | Admit: 2017-09-08 | Discharge: 2017-09-08 | Disposition: A | Discharge status: Home / Self Care | Payer: Medicaid Other | Source: Ambulatory Visit | Attending: Radiation Oncology | Admitting: Radiation Oncology

## 2017-09-08 ENCOUNTER — Encounter: Payer: Self-pay | Admitting: Hematology and Oncology

## 2017-09-08 VITALS — BP 117/81 | HR 57 | Temp 98.8°F | Resp 18 | Ht 67.0 in | Wt 128.2 lb

## 2017-09-08 DIAGNOSIS — Z5111 Encounter for antineoplastic chemotherapy: Secondary | ICD-10-CM | POA: Diagnosis present

## 2017-09-08 DIAGNOSIS — F1010 Alcohol abuse, uncomplicated: Secondary | ICD-10-CM | POA: Diagnosis not present

## 2017-09-08 DIAGNOSIS — C099 Malignant neoplasm of tonsil, unspecified: Secondary | ICD-10-CM

## 2017-09-08 DIAGNOSIS — Z720 Tobacco use: Secondary | ICD-10-CM

## 2017-09-08 DIAGNOSIS — Z510 Encounter for antineoplastic radiation therapy: Secondary | ICD-10-CM | POA: Diagnosis not present

## 2017-09-08 LAB — COMPREHENSIVE METABOLIC PANEL
ALT: 13 U/L (ref 0–55)
AST: 19 U/L (ref 5–34)
Albumin: 3.9 g/dL (ref 3.5–5.0)
Alkaline Phosphatase: 72 U/L (ref 40–150)
Anion Gap: 9 mEq/L (ref 3–11)
BILIRUBIN TOTAL: 0.66 mg/dL (ref 0.20–1.20)
BUN: 12.1 mg/dL (ref 7.0–26.0)
CALCIUM: 10.1 mg/dL (ref 8.4–10.4)
CHLORIDE: 101 meq/L (ref 98–109)
CO2: 28 meq/L (ref 22–29)
CREATININE: 0.8 mg/dL (ref 0.7–1.3)
EGFR: >60 mL/min/{1.73_m2} (ref >60)
Glucose: 104 mg/dl (ref 70–140)
Potassium: 4.1 mEq/L (ref 3.5–5.1)
SODIUM: 138 meq/L (ref 136–145)
TOTAL PROTEIN: 7.7 g/dL (ref 6.4–8.3)

## 2017-09-08 LAB — CBC WITH DIFFERENTIAL/PLATELET
BASO%: 0.5 % (ref 0.0–2.0)
BASOS ABS: 0 10*3/uL (ref 0.0–0.1)
EOS ABS: 0.2 10*3/uL (ref 0.0–0.5)
EOS%: 4.3 % (ref 0.0–7.0)
HCT: 37.3 % — ABNORMAL LOW (ref 38.4–49.9)
HGB: 12.3 g/dL — ABNORMAL LOW (ref 13.0–17.1)
LYMPH%: 23.8 % (ref 14.0–49.0)
MCH: 29.1 pg (ref 27.2–33.4)
MCHC: 33 g/dL (ref 32.0–36.0)
MCV: 88.2 fL (ref 79.3–98.0)
MONO#: 0.5 10*3/uL (ref 0.1–0.9)
MONO%: 12.3 % (ref 0.0–14.0)
NEUT#: 2.2 10*3/uL (ref 1.5–6.5)
NEUT%: 59.1 % (ref 39.0–75.0)
Platelets: 146 10*3/uL (ref 140–400)
RBC: 4.23 10*6/uL (ref 4.20–5.82)
RDW: 13.5 % (ref 11.0–14.6)
WBC: 3.7 10*3/uL — ABNORMAL LOW (ref 4.0–10.3)
lymph#: 0.9 10*3/uL (ref 0.9–3.3)

## 2017-09-08 LAB — MAGNESIUM: Magnesium: 2 mg/dl (ref 1.5–2.5)

## 2017-09-08 MED ORDER — PALONOSETRON HCL INJECTION 0.25 MG/5ML
0.2500 mg | Freq: Once | INTRAVENOUS | Status: AC
Start: 2017-09-08 — End: 2017-09-08
  Administered 2017-09-08: 0.25 mg via INTRAVENOUS

## 2017-09-08 MED ORDER — SODIUM CHLORIDE 0.9 % IV SOLN
20.0000 mg | Freq: Once | INTRAVENOUS | Status: AC
  Administered 2017-09-08: 20 mg via INTRAVENOUS
  Filled 2017-09-08: qty 2

## 2017-09-08 MED ORDER — FAMOTIDINE IN NACL 20-0.9 MG/50ML-% IV SOLN
20.0000 mg | Freq: Once | INTRAVENOUS | Status: AC
Start: 2017-09-08 — End: 2017-09-08
  Administered 2017-09-08: 20 mg via INTRAVENOUS

## 2017-09-08 MED ORDER — SODIUM CHLORIDE 0.9 % IV SOLN
Freq: Once | INTRAVENOUS | Status: AC
  Administered 2017-09-08: 12:00:00 via INTRAVENOUS

## 2017-09-08 MED ORDER — PALONOSETRON HCL INJECTION 0.25 MG/5ML
INTRAVENOUS | Status: AC
  Filled 2017-09-08: qty 5

## 2017-09-08 MED ORDER — SODIUM CHLORIDE 0.9 % IV SOLN
224.8000 mg | Freq: Once | INTRAVENOUS | Status: AC
  Administered 2017-09-08: 220 mg via INTRAVENOUS
  Filled 2017-09-08: qty 22

## 2017-09-08 MED ORDER — DIPHENHYDRAMINE HCL 50 MG/ML IJ SOLN
INTRAMUSCULAR | Status: AC
  Filled 2017-09-08: qty 1

## 2017-09-08 MED ORDER — DIPHENHYDRAMINE HCL 50 MG/ML IJ SOLN
50.0000 mg | Freq: Once | INTRAMUSCULAR | Status: AC
  Administered 2017-09-08: 50 mg via INTRAVENOUS

## 2017-09-08 MED ORDER — SODIUM CHLORIDE 0.9 % IV SOLN
45.0000 mg/m2 | Freq: Once | INTRAVENOUS | Status: AC
  Administered 2017-09-08: 78 mg via INTRAVENOUS
  Filled 2017-09-08: qty 13

## 2017-09-08 MED ORDER — FAMOTIDINE IN NACL 20-0.9 MG/50ML-% IV SOLN
INTRAVENOUS | Status: AC
  Filled 2017-09-08: qty 50

## 2017-09-08 NOTE — Patient Instructions (Addendum)
[name] Discharge Instructions for Patients Receiving Chemotherapy  Today you received the following chemotherapy agents Carboplatin, Taxol.  To help prevent nausea and vomiting after your treatment, we encourage you to take your nausea medication as prescribed. If you develop nausea and vomiting that is not controlled by your nausea medication, call the clinic.   BELOW ARE SYMPTOMS THAT SHOULD BE REPORTED IMMEDIATELY:  *FEVER GREATER THAN 100.5 F  *CHILLS WITH OR WITHOUT FEVER  NAUSEA AND VOMITING THAT IS NOT CONTROLLED WITH YOUR NAUSEA MEDICATION  *UNUSUAL SHORTNESS OF BREATH  *UNUSUAL BRUISING OR BLEEDING  TENDERNESS IN MOUTH AND THROAT WITH OR WITHOUT PRESENCE OF ULCERS  *URINARY PROBLEMS  *BOWEL PROBLEMS  UNUSUAL RASH Items with * indicate a potential emergency and should be followed up as soon as possible.  Feel free to call the clinic should you have any questions or concerns. The clinic phone number is [phone].  Please show the [place] at check-in to the Emergency Department and triage nurse.  Carboplatin injection What is this medicine? CARBOPLATIN (KAR boe pla tin) is a chemotherapy drug. It targets fast dividing cells, like cancer cells, and causes these cells to die. This medicine is used to treat ovarian cancer and many other cancers. This medicine may be used for other purposes; ask your health care provider or pharmacist if you have questions. COMMON BRAND NAME(S): Paraplatin What should I tell my health care provider before I take this medicine? They need to know if you have any of these conditions: -blood disorders -hearing problems -kidney disease -recent or ongoing radiation therapy -an unusual or allergic reaction to carboplatin, cisplatin, other chemotherapy, other medicines, foods, dyes, or preservatives -pregnant or trying to get pregnant -breast-feeding How should I use this medicine? This drug is usually given  as an infusion into a vein. It is administered in a hospital or clinic by a specially trained health care professional. Talk to your pediatrician regarding the use of this medicine in children. Special care may be needed. Overdosage: If you think you have taken too much of this medicine contact a poison control center or emergency room at once. NOTE: This medicine is only for you. Do not share this medicine with others. What if I miss a dose? It is important not to miss a dose. Call your doctor or health care professional if you are unable to keep an appointment. What may interact with this medicine? -medicines for seizures -medicines to increase blood counts like filgrastim, pegfilgrastim, sargramostim -some antibiotics like amikacin, gentamicin, neomycin, streptomycin, tobramycin -vaccines Talk to your doctor or health care professional before taking any of these medicines: -acetaminophen -aspirin -ibuprofen -ketoprofen -naproxen This list may not describe all possible interactions. Give your health care provider a list of all the medicines, herbs, non-prescription drugs, or dietary supplements you use. Also tell them if you smoke, drink alcohol, or use illegal drugs. Some items may interact with your medicine. What should I watch for while using this medicine? Your condition will be monitored carefully while you are receiving this medicine. You will need important blood work done while you are taking this medicine. This drug may make you feel generally unwell. This is not uncommon, as chemotherapy can affect healthy cells as well as cancer cells. Report any side effects. Continue your course of treatment even though you feel ill unless your doctor tells you to stop. In some cases, you may be given additional medicines to help with side effects. Follow all directions  for their use. Call your doctor or health care professional for advice if you get a fever, chills or sore throat, or other  symptoms of a cold or flu. Do not treat yourself. This drug decreases your body's ability to fight infections. Try to avoid being around people who are sick. This medicine may increase your risk to bruise or bleed. Call your doctor or health care professional if you notice any unusual bleeding. Be careful brushing and flossing your teeth or using a toothpick because you may get an infection or bleed more easily. If you have any dental work done, tell your dentist you are receiving this medicine. Avoid taking products that contain aspirin, acetaminophen, ibuprofen, naproxen, or ketoprofen unless instructed by your doctor. These medicines may hide a fever. Do not become pregnant while taking this medicine. Women should inform their doctor if they wish to become pregnant or think they might be pregnant. There is a potential for serious side effects to an unborn child. Talk to your health care professional or pharmacist for more information. Do not breast-feed an infant while taking this medicine. What side effects may I notice from receiving this medicine? Side effects that you should report to your doctor or health care professional as soon as possible: -allergic reactions like skin rash, itching or hives, swelling of the face, lips, or tongue -signs of infection - fever or chills, cough, sore throat, pain or difficulty passing urine -signs of decreased platelets or bleeding - bruising, pinpoint red spots on the skin, black, tarry stools, nosebleeds -signs of decreased red blood cells - unusually weak or tired, fainting spells, lightheadedness -breathing problems -changes in hearing -changes in vision -chest pain -high blood pressure -low blood counts - This drug may decrease the number of white blood cells, red blood cells and platelets. You may be at increased risk for infections and bleeding. -nausea and vomiting -pain, swelling, redness or irritation at the injection site -pain, tingling,  numbness in the hands or feet -problems with balance, talking, walking -trouble passing urine or change in the amount of urine Side effects that usually do not require medical attention (report to your doctor or health care professional if they continue or are bothersome): -hair loss -loss of appetite -metallic taste in the mouth or changes in taste This list may not describe all possible side effects. Call your doctor for medical advice about side effects. You may report side effects to FDA at 1-800-FDA-1088. Where should I keep my medicine? This drug is given in a hospital or clinic and will not be stored at home. NOTE: This sheet is a summary. It may not cover all possible information. If you have questions about this medicine, talk to your doctor, pharmacist, or health care provider.  2018 [employer] Standard (2008-01-10 14:38:05) Paclitaxel injection What is this medicine? PACLITAXEL (PAK li TAX el) is a chemotherapy drug. It targets fast dividing cells, like cancer cells, and causes these cells to die. This medicine is used to treat ovarian cancer, breast cancer, and other cancers. This medicine may be used for other purposes; ask your health care provider or pharmacist if you have questions. COMMON BRAND NAME(S): Onxol, Taxol What should I tell my health care provider before I take this medicine? They need to know if you have any of these conditions: -blood disorders -irregular heartbeat -infection (especially a virus infection such as chickenpox, cold sores, or herpes) -liver disease -previous or ongoing radiation therapy -an unusual or allergic reaction to paclitaxel, alcohol, polyoxyethylated  castor oil, other chemotherapy agents, other medicines, foods, dyes, or preservatives -pregnant or trying to get pregnant -breast-feeding How should I use this medicine? This drug is given as an infusion into a vein. It is administered in a hospital or clinic by a specially trained  health care professional. Talk to your pediatrician regarding the use of this medicine in children. Special care may be needed. Overdosage: If you think you have taken too much of this medicine contact a poison control center or emergency room at once. NOTE: This medicine is only for you. Do not share this medicine with others. What if I miss a dose? It is important not to miss your dose. Call your doctor or health care professional if you are unable to keep an appointment. What may interact with this medicine? Do not take this medicine with any of the following medications: -disulfiram -metronidazole This medicine may also interact with the following medications: -cyclosporine -diazepam -ketoconazole -medicines to increase blood counts like filgrastim, pegfilgrastim, sargramostim -other chemotherapy drugs like cisplatin, doxorubicin, epirubicin, etoposide, teniposide, vincristine -quinidine -testosterone -vaccines -verapamil Talk to your doctor or health care professional before taking any of these medicines: -acetaminophen -aspirin -ibuprofen -ketoprofen -naproxen This list may not describe all possible interactions. Give your health care provider a list of all the medicines, herbs, non-prescription drugs, or dietary supplements you use. Also tell them if you smoke, drink alcohol, or use illegal drugs. Some items may interact with your medicine. What should I watch for while using this medicine? Your condition will be monitored carefully while you are receiving this medicine. You will need important blood work done while you are taking this medicine. This medicine can cause serious allergic reactions. To reduce your risk you will need to take other medicine(s) before treatment with this medicine. If you experience allergic reactions like skin rash, itching or hives, swelling of the face, lips, or tongue, tell your doctor or health care professional right away. In some cases, you may  be given additional medicines to help with side effects. Follow all directions for their use. This drug may make you feel generally unwell. This is not uncommon, as chemotherapy can affect healthy cells as well as cancer cells. Report any side effects. Continue your course of treatment even though you feel ill unless your doctor tells you to stop. Call your doctor or health care professional for advice if you get a fever, chills or sore throat, or other symptoms of a cold or flu. Do not treat yourself. This drug decreases your body's ability to fight infections. Try to avoid being around people who are sick. This medicine may increase your risk to bruise or bleed. Call your doctor or health care professional if you notice any unusual bleeding. Be careful brushing and flossing your teeth or using a toothpick because you may get an infection or bleed more easily. If you have any dental work done, tell your dentist you are receiving this medicine. Avoid taking products that contain aspirin, acetaminophen, ibuprofen, naproxen, or ketoprofen unless instructed by your doctor. These medicines may hide a fever. Do not become pregnant while taking this medicine. Women should inform their doctor if they wish to become pregnant or think they might be pregnant. There is a potential for serious side effects to an unborn child. Talk to your health care professional or pharmacist for more information. Do not breast-feed an infant while taking this medicine. Men are advised not to father a child while receiving this medicine. This product  may contain alcohol. Ask your pharmacist or healthcare provider if this medicine contains alcohol. Be sure to tell all healthcare providers you are taking this medicine. Certain medicines, like metronidazole and disulfiram, can cause an unpleasant reaction when taken with alcohol. The reaction includes flushing, headache, nausea, vomiting, sweating, and increased thirst. The reaction can  last from 30 minutes to several hours. What side effects may I notice from receiving this medicine? Side effects that you should report to your doctor or health care professional as soon as possible: -allergic reactions like skin rash, itching or hives, swelling of the face, lips, or tongue -low blood counts - This drug may decrease the number of white blood cells, red blood cells and platelets. You may be at increased risk for infections and bleeding. -signs of infection - fever or chills, cough, sore throat, pain or difficulty passing urine -signs of decreased platelets or bleeding - bruising, pinpoint red spots on the skin, black, tarry stools, nosebleeds -signs of decreased red blood cells - unusually weak or tired, fainting spells, lightheadedness -breathing problems -chest pain -high or low blood pressure -mouth sores -nausea and vomiting -pain, swelling, redness or irritation at the injection site -pain, tingling, numbness in the hands or feet -slow or irregular heartbeat -swelling of the ankle, feet, hands Side effects that usually do not require medical attention (report to your doctor or health care professional if they continue or are bothersome): -bone pain -complete hair loss including hair on your head, underarms, pubic hair, eyebrows, and eyelashes -changes in the color of fingernails -diarrhea -loosening of the fingernails -loss of appetite -muscle or joint pain -red flush to skin -sweating This list may not describe all possible side effects. Call your doctor for medical advice about side effects. You may report side effects to FDA at 1-800-FDA-1088. Where should I keep my medicine? This drug is given in a hospital or clinic and will not be stored at home. NOTE: This sheet is a summary. It may not cover all possible information. If you have questions about this medicine, talk to your doctor, pharmacist, or health care provider.  2018 [employer] Standard (2015-08-06  19:58:00)

## 2017-09-09 LAB — PHOSPHORUS: Phosphorus, Ser: 3.9 mg/dL (ref 2.5–4.5)

## 2017-09-10 ENCOUNTER — Other Ambulatory Visit: Payer: Self-pay | Admitting: Radiology

## 2017-09-11 NOTE — Progress Notes (Signed)
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  Imaging demonstrates locally advanced disease without evidence of metastatic disease in the lymph nodes by PET/CT.  No evidence of distal metastasis.  Patient undergone dental evaluation and surgery due to poor dentition.  Presently, he is ready to proceed with definitive therapy.  Due to locally advanced disease, our preference is to proceed with radiation with concurrent chemotherapy.  The treatment options have been reviewed with the patient.  He does have pre-existing hearing loss and high-dose cisplatin is unlikely to be well tolerated.  With that in mind, combination of carboplatin and paclitaxel administered weekly will be used. The medications, their dosing, schedule, potential complications and side effects have been discussed with the patient in detail.  He agrees to proceed with the treatment as recommended.  Clinical evaluation lab work today permissive to proceed with therapy as planned.  Plan:  --Proceed with Week #1 of carboplatin/paclitaxel --Return to my clinic in 1 week for labs, toxicity monitoring, and continuation of systemic therapy Voice recognition software was used and creation of this note. Despite my best effort at editing the text, some misspelling/errors may have occurred.  Orders Placed This Encounter  Procedures  . CBC with Differential    Standing Status:   Future    Number of Occurrences:   1    Standing Expiration Date:   09/01/2018  . Comprehensive metabolic panel    Standing Status:   Future    Number of Occurrences:   1    Standing Expiration Date:   09/01/2018  . Magnesium    Standing Status:   Future    Number of Occurrences:   1    Standing Expiration Date:   09/01/2018  . 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) Staging form: Pharynx - P16 Negative Oropharynx, AJCC 8th Edition - Clinical stage from 08/06/2017: Stage II (cT2, cN0, cM0, p16: Negative) - Signed by Ardath Sax, MD on 09/01/2017   All questions were answered. . The patient knows to call the clinic with any problems, questions or concerns.  This note was electronically signed.    History of Presenting Illness [name] is a 56 y.o. male followed in the [place] for diagnosis of squamous cell carcinoma of the left tonsil. Patient initially presented with sore throat onset around the middle of June. He first presented to the Urgent Care on 04/26/17 after 3 weeks of symptoms. The sore throat was also accompanied by the globus sensation in the neck. He was treated for uvulitis initially, wihtout significant improvement despite two additional visits to the Urgent Care. On his last visit with the [facility]gent CareENT referral was made.  The patient saw Dr. Janace Hoard from [facility] and underwent laryngoscopy on 07/16/17 with biopsy which confirmed presence of squamous cell carcinoma. Please see results of additional evaluation in the oncological history below. At the present time, additional assessment is pending. Patient was seen by dentistry and multiple dental extractions were recommended. He is scheduled for surgical removal of his teeth on 08/09/17.   Current symptoms include no fevers, chills, night sweats. 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 Denies chest pain, shortness  of breath, or cough. No nausea, vomiting, abdominal pain, diarrhea, or constipation. No urinary or neurological symptoms.  Patient is an active tobacco user, smoking at least 1ppd x 30 years. In addition, patient is habitual daily alcohol userdrinking as much as 1 beer (a 40oz) and 1 pint of gin per day. Patient has been treated for alcohol dependency in the past. His  own presentation trace some degree of denial/attempts at minimizing his alcohol dependency as initially he only mentioned drinking 1 beer per day. His family has corrected him, and he acknowledged a higher level of alcohol use. In the past, he has stopped drinking, but he does get withdrawal symptoms rapidly.  Patient returns to the clinic to initiate systemic therapy with carboplatin and paclitaxel administered concurrently with radiation therapy that is also scheduled to start today.  Patient denies any new complaints at this time.  He went through chemotherapy education and does not have any additional questions at this point in time.   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 There are foci suspicious but not definitive for invasion. p16 immunohistochemistry is negative in the neoplastic cells. FINAL DIAGNOSIS       07/27/2017 Imaging    CT neck: Indistinct tonsillar enlargement on the left in the region of left tonsillar mass biopsy. This could be a combination of post biopsy edema and residual mass. Margins are not discrete and accurate measurement cannot be performed. This is estimated at about 3 cm in size. No evidence of metastatic adenopathy.      08/16/2017 Imaging    PET-CT: Hypermetabolic disease in the left palatal fossa.  Indeterminate level 3 lymph node which may be reactive considering recent dental extractions.      09/01/2017 -  Radiation Therapy         09/01/2017 -  Chemotherapy    Carbboplatin AUC2,d1 + Paclitaxel 80m/m2, d1 Q7d --Week #1, 09/01/17:       Medical History: Past Medical History:  Diagnosis Date  . Cancer ([facility]    left tonsil cancer    Surgical History: Past  Surgical History:  Procedure Laterality Date  . MULTIPLE EXTRACTIONS WITH ALVEOLOPLASTY N/A 08/09/2017   Procedure: Extraction of tooth #'s 1-6, 11-17,21,22,26-30 and 32 with alveoloplasty and bilateral mandibular tori reductions;  Surgeon: KLenn Cal DDS;  Location: [facility];  Service: Oral Surgery;  Laterality: N/A;  . tonsil biopsy     left tonsil    Family History: Family History  Problem Relation Age of Onset  . Diabetes Mother     Social History: Social History   Socioeconomic History  . Marital status: Single    Spouse name: Not on file  . Number of children: 1  . Years of education: Not on file  . Highest education level: Not on file  Social Needs  . Financial resource strain: Not on file  . Food insecurity - worry: Not on file  . Food insecurity - inability: Not on file  . Transportation needs - medical: Not on file  . Transportation needs - non-medical: Not on file  Occupational History  . Occupation: Disabled  Tobacco Use  . Smoking status: Current Every Day Smoker    Packs/day: 1.00  Years: 30.00    Pack years: 30.00  . Smokeless tobacco: Never Used  Substance and Sexual Activity  . Alcohol use: Yes    Alcohol/week: 4.2 oz    Types: 7 Cans of beer per week    Comment: 1 pint daily   . Drug use: No    Comment: he has a past history of cocaine abuse, he denies current use  . Sexual activity: Not on file  Other Topics Concern  . Not on file  Social History Narrative  . Not on file    Allergies: No Known Allergies  Medications:  Current Outpatient Medications  Medication Sig Dispense Refill  . acetaminophen (TYLENOL) 325 MG tablet Take 650 mg by mouth every 6 (six) hours as needed for moderate pain.     . Oxycodone HCl 10 MG TABS Take 1 tablet (10 mg total) every 4 (four) hours as needed by mouth. 50 tablet 0  . dexamethasone (DECADRON) 4 MG tablet Take 2 tablets (8 mg total) daily by mouth. Start the day after chemotherapy for 2 days. 30  tablet 1  . lidocaine (XYLOCAINE) 2 % solution Mix 1 part 2%viscous lidocaine,1part H2O.Swish and/or swallow 54m of this mixture,335m before meals and at bedtime, up to QID 100 mL 5  . lidocaine-prilocaine (EMLA) cream Apply to affected area once 30 g 3  . LORazepam (ATIVAN) 0.5 MG tablet Take 1 tablet (0.5 mg total) every 6 (six) hours as needed by mouth (Nausea or vomiting). (Patient not taking: Reported on 09/01/2017) 30 tablet 0  . nicotine polacrilex (NICOTINE MINI) 4 MG lozenge Take 1 lozenge (4 mg total) by mouth as needed for smoking cessation. 100 tablet 0  . ondansetron (ZOFRAN) 8 MG tablet Take 1 tablet (8 mg total) 2 (two) times daily as needed by mouth for refractory nausea / vomiting. Start on day 3 after chemo. 30 tablet 1  . prochlorperazine (COMPAZINE) 10 MG tablet Take 1 tablet (10 mg total) every 6 (six) hours as needed by mouth (Nausea or vomiting). 30 tablet 1   No current facility-administered medications for this visit.     Review of Systems: Review of Systems  All other systems reviewed and are negative.    PHYSICAL EXAMINATION Blood pressure 102/69, pulse 60, temperature 98.8 F (37.1 C), temperature source Oral, resp. rate 17, height 5' 7" (1.702 m), weight 129 lb 8 oz (58.7 kg), SpO2 100 %.  ECOG PERFORMANCE STATUS: 1 - Symptomatic but completely ambulatory  Physical Exam  Constitutional: He is oriented to person, place, and time and well-developed, well-nourished, and in no distress. No distress.  HENT:  Head: Normocephalic and atraumatic.  Mouth/Throat: Uvula is midline, oropharynx is clear and moist and mucous membranes are normal. He does not have dentures. Oral lesions present. Abnormal dentition. Dental caries present. No dental abscesses or uvula swelling. No oropharyngeal exudate or tonsillar abscesses.  Eyes: Conjunctivae and EOM are normal. Pupils are equal, round, and reactive to light. No scleral icterus.  Neck: No thyromegaly present.   Cardiovascular: Normal rate, regular rhythm, normal heart sounds and intact distal pulses.  No murmur heard. Pulmonary/Chest: Effort normal and breath sounds normal. No respiratory distress. He has no wheezes. He has no rales.  Abdominal: Soft. Bowel sounds are normal. He exhibits no distension and no mass. There is no tenderness. There is no rebound and no guarding.  Musculoskeletal: Normal range of motion. He exhibits no edema.  Lymphadenopathy:    He has no cervical adenopathy.  Neurological: He  is alert and oriented to person, place, and time. He has normal reflexes. No cranial nerve deficit. Coordination normal.  Skin: Skin is warm and dry. No rash noted. He is not diaphoretic. No erythema.     LABORATORY DATA: I have personally reviewed the data as listed: Appointment on 09/01/2017  Component Date Value Ref Range Status  . WBC 09/01/2017 3.1* 4.0 - 10.3 10e3/uL Final  . NEUT# 09/01/2017 1.3* 1.5 - 6.5 10e3/uL Final  . HGB 09/01/2017 12.4* 13.0 - 17.1 g/dL Final  . HCT 09/01/2017 38.1* 38.4 - 49.9 % Final  . Platelets 09/01/2017 127* 140 - 400 10e3/uL Final  . MCV 09/01/2017 88.4  79.3 - 98.0 fL Final  . MCH 09/01/2017 28.7  27.2 - 33.4 pg Final  . MCHC 09/01/2017 32.5  32.0 - 36.0 g/dL Final  . RBC 09/01/2017 4.31  4.20 - 5.82 10e6/uL Final  . RDW 09/01/2017 14.1  11.0 - 14.6 % Final  . lymph# 09/01/2017 1.2  0.9 - 3.3 10e3/uL Final  . MONO# 09/01/2017 0.4  0.1 - 0.9 10e3/uL Final  . Eosinophils Absolute 09/01/2017 0.2  0.0 - 0.5 10e3/uL Final  . Basophils Absolute 09/01/2017 0.0  0.0 - 0.1 10e3/uL Final  . NEUT% 09/01/2017 40.9  39.0 - 75.0 % Final  . LYMPH% 09/01/2017 38.1  14.0 - 49.0 % Final  . MONO% 09/01/2017 13.2  0.0 - 14.0 % Final  . EOS% 09/01/2017 7.1* 0.0 - 7.0 % Final  . BASO% 09/01/2017 0.7  0.0 - 2.0 % Final  . Phosphorus, Ser 09/01/2017 3.7  2.5 - 4.5 mg/dL Final  . Sodium 09/01/2017 139  136 - 145 mEq/L Final  . Potassium 09/01/2017 4.0  3.5 - 5.1 mEq/L  Final  . Chloride 09/01/2017 101  98 - 109 mEq/L Final  . CO2 09/01/2017 28  22 - 29 mEq/L Final  . Glucose 09/01/2017 82  70 - 140 mg/dl Final   Glucose reference range is for nonfasting patients. Fasting glucose reference range is 70- 100.  Marland Kitchen BUN 09/01/2017 9.8  7.0 - 26.0 mg/dL Final  . Creatinine 09/01/2017 0.8  0.7 - 1.3 mg/dL Final  . Total Bilirubin 09/01/2017 0.85  0.20 - 1.20 mg/dL Final  . Alkaline Phosphatase 09/01/2017 68  40 - 150 U/L Final  . AST 09/01/2017 28  5 - 34 U/L Final  . ALT 09/01/2017 14  0 - 55 U/L Final  . Total Protein 09/01/2017 7.4  6.4 - 8.3 g/dL Final  . Albumin 09/01/2017 3.8  3.5 - 5.0 g/dL Final  . Calcium 09/01/2017 9.8  8.4 - 10.4 mg/dL Final  . Anion Gap 09/01/2017 10  3 - 11 mEq/L Final  . EGFR 09/01/2017 >60  >60 ml/min/1.73 m2 Final   eGFR is calculated using the CKD-EPI Creatinine Equation (2009)       Ardath Sax, MD

## 2017-09-11 NOTE — Assessment & Plan Note (Signed)
56 y.o. with left tonsillar carcinoma.  Imaging demonstrates locally advanced disease without evidence of metastatic disease in the lymph nodes by PET/CT.  No evidence of distal metastasis.  Patient undergone dental evaluation and surgery due to poor dentition.  Presently, he is ready to proceed with definitive therapy.  Due to locally advanced disease, our preference is to proceed with radiation with concurrent chemotherapy.  The treatment options have been reviewed with the patient.  He does have pre-existing hearing loss and high-dose cisplatin is unlikely to be well tolerated.  With that in mind, combination of carboplatin and paclitaxel administered weekly will be used. The medications, their dosing, schedule, potential complications and side effects have been discussed with the patient in detail.  He agrees to proceed with the treatment as recommended.  Clinical evaluation lab work today permissive to proceed with therapy as planned.  Plan:  --Proceed with Week #1 of carboplatin/paclitaxel --Return to my clinic in 1 week for labs, toxicity monitoring, and continuation of systemic therapy

## 2017-09-13 ENCOUNTER — Telehealth: Payer: Self-pay | Admitting: Hematology and Oncology

## 2017-09-13 ENCOUNTER — Encounter (HOSPITAL_COMMUNITY): Payer: Self-pay

## 2017-09-13 ENCOUNTER — Ambulatory Visit (HOSPITAL_COMMUNITY)
Admission: RE | Admit: 2017-09-13 | Discharge: 2017-09-13 | Disposition: A | Discharge status: Home / Self Care | Payer: Medicaid Other | Source: Ambulatory Visit | Attending: Hematology and Oncology | Admitting: Hematology and Oncology

## 2017-09-13 ENCOUNTER — Other Ambulatory Visit: Payer: Self-pay | Admitting: Hematology and Oncology

## 2017-09-13 ENCOUNTER — Ambulatory Visit (HOSPITAL_COMMUNITY): Payer: Self-pay | Admitting: Dentistry

## 2017-09-13 ENCOUNTER — Ambulatory Visit
Admission: RE | Admit: 2017-09-13 | Discharge: 2017-09-13 | Disposition: A | Discharge status: Home / Self Care | Payer: Medicaid Other | Source: Ambulatory Visit | Attending: Radiation Oncology | Admitting: Radiation Oncology

## 2017-09-13 DIAGNOSIS — C099 Malignant neoplasm of tonsil, unspecified: Secondary | ICD-10-CM

## 2017-09-13 DIAGNOSIS — F17210 Nicotine dependence, cigarettes, uncomplicated: Secondary | ICD-10-CM | POA: Diagnosis not present

## 2017-09-13 DIAGNOSIS — Z510 Encounter for antineoplastic radiation therapy: Secondary | ICD-10-CM | POA: Diagnosis not present

## 2017-09-13 HISTORY — PX: IR FLUORO GUIDE PORT INSERTION RIGHT: IMG5741

## 2017-09-13 HISTORY — PX: IR GASTROSTOMY TUBE MOD SED: IMG625

## 2017-09-13 HISTORY — PX: IR US GUIDE VASC ACCESS RIGHT: IMG2390

## 2017-09-13 LAB — COMPREHENSIVE METABOLIC PANEL
ALBUMIN: 4.2 g/dL (ref 3.5–5.0)
ALT: 14 U/L — AB (ref 17–63)
AST: 26 U/L (ref 15–41)
Alkaline Phosphatase: 69 U/L (ref 38–126)
Anion gap: 10 (ref 5–15)
BUN: 12 mg/dL (ref 6–20)
CHLORIDE: 97 mmol/L — AB (ref 101–111)
CO2: 28 mmol/L (ref 22–32)
CREATININE: 0.7 mg/dL (ref 0.61–1.24)
Calcium: 9.6 mg/dL (ref 8.9–10.3)
GFR calc Af Amer: >60 mL/min (ref >60)
GLUCOSE: 123 mg/dL — AB (ref 65–99)
POTASSIUM: 3.6 mmol/L (ref 3.5–5.1)
SODIUM: 135 mmol/L (ref 135–145)
Total Bilirubin: 1.7 mg/dL — ABNORMAL HIGH (ref 0.3–1.2)
Total Protein: 8 g/dL (ref 6.5–8.1)

## 2017-09-13 LAB — CBC WITH DIFFERENTIAL/PLATELET
BASOS ABS: 0 10*3/uL (ref 0.0–0.1)
Basophils Relative: 1 %
Eosinophils Absolute: 0.1 10*3/uL (ref 0.0–0.7)
Eosinophils Relative: 2 %
HEMATOCRIT: 38.4 % — AB (ref 39.0–52.0)
HEMOGLOBIN: 13.1 g/dL (ref 13.0–17.0)
LYMPHS PCT: 16 %
Lymphs Abs: 0.5 10*3/uL — ABNORMAL LOW (ref 0.7–4.0)
MCH: 29.5 pg (ref 26.0–34.0)
MCHC: 34.1 g/dL (ref 30.0–36.0)
MCV: 86.5 fL (ref 78.0–100.0)
MONO ABS: 0.2 10*3/uL (ref 0.1–1.0)
Monocytes Relative: 5 %
NEUTROS ABS: 2.4 10*3/uL (ref 1.7–7.7)
NEUTROS PCT: 76 %
PLATELETS: 187 10*3/uL (ref 150–400)
RBC: 4.44 MIL/uL (ref 4.22–5.81)
RDW: 13.1 % (ref 11.5–15.5)
WBC: 3.2 10*3/uL — ABNORMAL LOW (ref 4.0–10.5)

## 2017-09-13 LAB — PROTIME-INR
INR: 1.08
PROTHROMBIN TIME: 13.9 s (ref 11.4–15.2)

## 2017-09-13 MED ORDER — FENTANYL CITRATE (PF) 100 MCG/2ML IJ SOLN
INTRAMUSCULAR | Status: AC
  Filled 2017-09-13: qty 4

## 2017-09-13 MED ORDER — CEFAZOLIN SODIUM-DEXTROSE 2-4 GM/100ML-% IV SOLN
2.0000 g | INTRAVENOUS | Status: AC
  Administered 2017-09-13: 2 g via INTRAVENOUS

## 2017-09-13 MED ORDER — CEFAZOLIN SODIUM-DEXTROSE 2-4 GM/100ML-% IV SOLN
INTRAVENOUS | Status: AC
  Filled 2017-09-13: qty 100

## 2017-09-13 MED ORDER — HEPARIN SOD (PORK) LOCK FLUSH 100 UNIT/ML IV SOLN
INTRAVENOUS | Status: AC
  Filled 2017-09-13: qty 5

## 2017-09-13 MED ORDER — MIDAZOLAM HCL 2 MG/2ML IJ SOLN
INTRAMUSCULAR | Status: AC
  Filled 2017-09-13: qty 4

## 2017-09-13 MED ORDER — GLUCAGON HCL RDNA (DIAGNOSTIC) 1 MG IJ SOLR
INTRAMUSCULAR | Status: AC | PRN
  Administered 2017-09-13: 1 mg via INTRAVENOUS

## 2017-09-13 MED ORDER — IOPAMIDOL (ISOVUE-300) INJECTION 61%
INTRAVENOUS | Status: AC
  Filled 2017-09-13: qty 50

## 2017-09-13 MED ORDER — SODIUM CHLORIDE 0.9 % IV SOLN
INTRAVENOUS | Status: DC
  Administered 2017-09-13: 12:00:00 via INTRAVENOUS

## 2017-09-13 MED ORDER — GLUCAGON HCL RDNA (DIAGNOSTIC) 1 MG IJ SOLR
INTRAMUSCULAR | Status: AC
  Filled 2017-09-13: qty 1

## 2017-09-13 MED ORDER — MIDAZOLAM HCL 2 MG/2ML IJ SOLN
INTRAMUSCULAR | Status: AC | PRN
  Administered 2017-09-13 (×4): 1 mg via INTRAVENOUS

## 2017-09-13 MED ORDER — LIDOCAINE-EPINEPHRINE (PF) 2 %-1:200000 IJ SOLN
INTRAMUSCULAR | Status: AC
  Filled 2017-09-13: qty 20

## 2017-09-13 MED ORDER — SONAFINE EX EMUL
1.0000 "application " | Freq: Two times a day (BID) | CUTANEOUS | Status: DC
  Administered 2017-09-13: 1 via TOPICAL

## 2017-09-13 MED ORDER — LIDOCAINE-EPINEPHRINE (PF) 1 %-1:200000 IJ SOLN
INTRAMUSCULAR | Status: AC | PRN
  Administered 2017-09-13: 20 mL

## 2017-09-13 MED ORDER — FENTANYL CITRATE (PF) 100 MCG/2ML IJ SOLN
INTRAMUSCULAR | Status: AC | PRN
  Administered 2017-09-13 (×4): 50 ug via INTRAVENOUS

## 2017-09-13 MED ORDER — IOPAMIDOL (ISOVUE-300) INJECTION 61%
50.0000 mL | Freq: Once | INTRAVENOUS | Status: AC | PRN
  Administered 2017-09-13: 40 mL

## 2017-09-13 NOTE — Consult Note (Signed)
Chief Complaint: Patient was seen in consultation today for port a cath and gastrostomy tube placements  Referring Physician(s): Perlov,Mikhail G  Supervising Physician: Sandi Mariscal  Patient Status: [place]  History of Present Illness: [name] is a 56 y.o. male exsmoker with history of left tonsil carcinoma with locally advanced disease who presents today for port a cath and percutaneous gastrostomy tube placements while undergoing chemoradiation.   Past Medical History:  Diagnosis Date  . Cancer [facility])    left tonsil cancer    Past Surgical History:  Procedure Laterality Date  . MULTIPLE EXTRACTIONS WITH ALVEOLOPLASTY N/A 08/09/2017   Procedure: Extraction of tooth #'s 1-6, 11-17,21,22,26-30 and 32 with alveoloplasty and bilateral mandibular tori reductions;  Surgeon: Lenn Cal, DDS;  Location: [facility];  Service: Oral Surgery;  Laterality: N/A;  . tonsil biopsy     left tonsil    Allergies: Patient has no known allergies.  Medications: Prior to Admission medications   Medication Sig Start Date End Date Taking? Authorizing Provider  acetaminophen (TYLENOL) 325 MG tablet Take 650 mg by mouth every 6 (six) hours as needed for moderate pain.     [provider]  dexamethasone (DECADRON) 4 MG tablet Take 2 tablets (8 mg total) daily by mouth. Start the day after chemotherapy for 2 days. 09/01/17   Ardath Sax, MD  lidocaine (XYLOCAINE) 2 % solution Mix 1 part 2%viscous lidocaine,1part H2O.Swish and/or swallow 68mL of this mixture,44min before meals and at bedtime, up to QID 09/05/17   Eppie Gibson, MD  lidocaine-prilocaine (EMLA) cream Apply to affected area once 09/01/17   Perlov, Marinell Blight, MD  LORazepam (ATIVAN) 0.5 MG tablet Take 1 tablet (0.5 mg total) every 6 (six) hours as needed by mouth (Nausea or vomiting). 08/31/17   Ardath Sax, MD  nicotine polacrilex (NICOTINE MINI) 4 MG lozenge Take 1 lozenge (4 mg total) by mouth as  needed for smoking cessation. 08/06/17   Ardath Sax, MD  ondansetron (ZOFRAN) 8 MG tablet Take 1 tablet (8 mg total) 2 (two) times daily as needed by mouth for refractory nausea / vomiting. Start on day 3 after chemo. 09/01/17   Ardath Sax, MD  Oxycodone HCl 10 MG TABS Take 1 tablet (10 mg total) every 4 (four) hours as needed by mouth. 09/01/17   Perlov, Marinell Blight, MD  prochlorperazine (COMPAZINE) 10 MG tablet Take 1 tablet (10 mg total) every 6 (six) hours as needed by mouth (Nausea or vomiting). 09/01/17   Ardath Sax, MD     Family History  Problem Relation Age of Onset  . Diabetes Mother     Social History   Socioeconomic History  . Marital status: Single    Spouse name: Not on file  . Number of children: 1  . Years of education: Not on file  . Highest education level: Not on file  Social Needs  . Financial resource strain: Not on file  . Food insecurity - worry: Not on file  . Food insecurity - inability: Not on file  . Transportation needs - medical: Not on file  . Transportation needs - non-medical: Not on file  Occupational History  . Occupation: Disabled  Tobacco Use  . Smoking status: Current Every Day Smoker    Packs/day: 1.00    Years: 30.00    Pack years: 30.00  . Smokeless tobacco: Never Used  Substance and Sexual Activity  . Alcohol use: Yes    Alcohol/week:  4.2 oz    Types: 7 Cans of beer per week    Comment: 1 pint daily   . Drug use: No    Comment: he has a past history of cocaine abuse, he denies current use  . Sexual activity: Not on file  Other Topics Concern  . Not on file  Social History Narrative  . Not on file     Review of Systems currently denies fever, chest pain, dyspnea, abdominal/back pain, nausea, vomiting or bleeding.  He has had occasional headaches, intermittent coughing, sore throat, dysphagia, and weight loss.  Vital Signs: Blood pressure 112/83, heart rate 81, respirations 18, O2 sat 100% room air,  temperature 99   Physical Exam thin black male in no acute distress.  Chest clear to auscultation bilaterally.  Heart with regular rate and rhythm.  Abdomen soft, positive bowel sounds, nontender.  No lower extremity edema.  Imaging: Nm Pet Image Initial (pi) Skull Base To Thigh  Result Date: 08/16/2017 CLINICAL DATA:  Initial treatment strategy for staging of tonsillar cancer. EXAM: NUCLEAR MEDICINE PET SKULL BASE TO THIGH TECHNIQUE: 618 mCi F-18 FDG was injected intravenously. Full-ring PET imaging was performed from the skull base to thigh after the radiotracer. CT data was obtained and used for attenuation correction and anatomic localization. FASTING BLOOD GLUCOSE:  Value: 117 mg/dl COMPARISON:  07/27/2017 neck CT. FINDINGS: NECK: Left palatine tonsil hypermetabolism, corresponding to soft tissue fullness on CT. This measures a S.U.V. max of 13.2, including on image 18/series 4. Favor muscular activity about the right-sided back. A left-sided level 3 focus of low-level hypermetabolism may correspond to a small node. This node measures 5 mm and a S.U.V. max of 2.2 on image 33/series 4. Right sided cervical lipoma.  Fluid in the left maxillary sinus. CHEST: No pulmonary parenchymal or thoracic nodal hypermetabolism. Lad and right coronary artery atherosclerosis. Mild centrilobular paraseptal emphysema. ABDOMEN/PELVIS: No abdominopelvic parenchymal or nodal hypermetabolism. Normal adrenal glands. Abdominal aortic atherosclerosis. SKELETON: No abnormal marrow activity. No focal osseous lesion. Remote left rib trauma. IMPRESSION: 1. Hypermetabolism corresponding to the known left palatine tonsil primary. 2. Low-level, nonspecific hypermetabolism within the left level 3 station, possibly corresponding to a small node in this area. This is indeterminate, favored to be benign/reactive. Recommend attention on follow-up. 3. No evidence of extracervical metastatic disease. 4. Age advanced coronary artery  atherosclerosis. Recommend assessment of coronary risk factors and consideration of medical therapy. 5.  Emphysema (ICD10-J43.9). 6.  Aortic Atherosclerosis (ICD10-I70.0). Electronically Signed   By: Abigail Miyamoto M.D.   On: 08/16/2017 13:40    Labs:  CBC: Recent Labs    08/06/17 1347 09/01/17 0807 09/08/17 0855 09/13/17 1140  WBC 3.9* 3.1* 3.7* 3.2*  HGB 12.3* 12.4* 12.3* 13.1  HCT 37.0* 38.1* 37.3* 38.4*  PLT 107* 127* 146 187    COAGS: No results for input(s): INR, APTT in the last 8760 hours.  BMP: Recent Labs    08/06/17 1347 09/01/17 0807 09/08/17 0855  NA 137 139 138  K 3.7 4.0 4.1  CL 100*  --   --   CO2 27 28 28   GLUCOSE 91 82 104  BUN 11 9.8 12.1  CALCIUM 9.3 9.8 10.1  CREATININE 0.56* 0.8 0.8  GFRNONAA >60  --   --   GFRAA >60  --   --     LIVER FUNCTION TESTS: Recent Labs    09/01/17 0807 09/08/17 0855  BILITOT 0.85 0.66  AST 28 19  ALT 14 13  ALKPHOS 68 72  PROT 7.4 7.7  ALBUMIN 3.8 3.9    TUMOR MARKERS: No results for input(s): AFPTM, CEA, CA199, CHROMGRNA in the last 8760 hours.  Assessment and Plan: 56 y.o. male exsmoker with history of left tonsil carcinoma with locally advanced disease who presents today for port a cath and percutaneous gastrostomy tube placements while undergoing chemoradiation.  Details/risks of procedure, including but not limited to, internal bleeding, infection, injury to adjacent structures, discussed with patient and sister with their understanding and consent.   Thank you for this interesting consult.  I greatly enjoyed meeting [name] and look forward to participating in their care.  A copy of this report was sent to the requesting provider on this date.  Electronically Signed: D. Rowe Robert, PA-C 09/13/2017, 12:13 PM   I spent a total of 25 minutes  in face to face in clinical consultation, greater than 50% of which was counseling/coordinating care for port a  cath and gastrostomy tube  placements

## 2017-09-13 NOTE — Telephone Encounter (Signed)
Spoke woith patient re appointments for 11/27 and 11/28. Patient to get new schedule 11/27.

## 2017-09-13 NOTE — Discharge Instructions (Addendum)
Gastrostomy Tube Home Guide, Adult A gastrostomy tube is a tube that is surgically placed into the stomach. It is also called a G-tube. G-tubes are used when a person is unable to eat and drink enough on their own to stay healthy. The tube is inserted into the stomach through a small cut (incision) in the skin. This tube is used for:  Feeding.  Giving medication.  Gastrostomy tube care  Wash your hands with soap and water.  Remove the old dressing (if any). Some styles of G-tubes may need a dressing inserted between the skin and the G-tube. Other types of G-tubes do not require a dressing. Ask your health care provider if a dressing is needed.  Check the area where the tube enters the skin (insertion site) for redness, swelling, or pus-like (purulent) drainage. A small amount of clear or tan liquid drainage is normal. Check to make sure scar tissue (skin) is not growing around the insertion site. This could have a raised, bumpy appearance.  A cotton swab can be used to clean the skin around the tube: ? When the G-tube is first put in, a normal saline solution or water can be used to clean the skin. ? Mild soap and warm water can be used when the skin around the G-tube site has healed. ? Roll the cotton swab around the G-tube insertion site to remove any drainage or crusting at the insertion site. Stomach residuals Feeding tube residuals are the amount of liquids that are in the stomach at any given time. Residuals may be checked before giving feedings, medications, or as instructed by your health care provider.  Ask your health care provider if there are instances when you would not start tube feedings depending on the amount or type of contents withdrawn from the stomach.  Check residuals by attaching a syringe to the G-tube and pulling back on the syringe plunger. Note the amount, and return the residual back into the stomach.  Flushing the G-tube  The G-tube should be periodically  flushed with clean warm water to keep it from clogging. ? Flush the G-tube after feedings or medications. Draw up 30 mL of warm water in a syringe. Connect the syringe to the G-tube and slowly push the water into the tube. ? Do not push feedings, medications, or flushes rapidly. Flush the G-tube gently and slowly. ? Only use syringes made for G-tubes to flush medications or feedings. ? Your health care provider may want the G-tube flushed more often or with more water. If this is the case, follow your health care provider's instructions. Feedings Your health care provider will determine whether feedings are given as a bolus (a certain amount given at one time and at scheduled times) or whether feedings will be given continuously on a feeding pump.  Formulas should be given at room temperature.  If feedings are continuous, no more than 4 hours worth of feedings should be placed in the feeding bag. This helps prevent spoilage or accidental excess infusion.  Cover and place unused formula in the refrigerator.  If feedings are continuous, stop the feedings when medications or flushes are given. Be sure to restart the feedings.  Feeding bags and syringes should be replaced as instructed by your health care provider.  Giving medication  In general, it is best if all medications are in a liquid form for G-tube administration. Liquid medications are less likely to clog the G-tube. ? Mix the liquid medication with 30 mL (or amount recommended  by your health care provider) of warm water. ? Draw up the medication into the syringe. ? Attach the syringe to the G-tube and slowly push the mixture into the G-tube. ? After giving the medication, draw up 30 mL of warm water in the syringe and slowly flush the G-tube.  For pills or capsules, check with your health care provider first before crushing medications. Some pills are not effective if they are crushed. Some capsules are sustained-release  medications. ? If appropriate, crush the pill or capsule and mix with 30 mL of warm water. Using the syringe, slowly push the medication through the tube, then flush the tube with another 30 mL of tap water. G-tube problems G-tube was pulled out.  Cause: May have been pulled out accidentally.  Solutions: Cover the opening with clean dressing and tape. Call your health care provider right away. The G-tube should be put in as soon as possible (within 4 hours) so the G-tube opening (tract) does not close. The G-tube needs to be put in at a health care setting. An X-ray needs to be done to confirm placement before the G-tube can be used again.  Redness, irritation, soreness, or foul odor around the gastrostomy site.  Cause: May be caused by leakage or infection.  Solutions: Call your health care provider right away.  Large amount of leakage of fluid or mucus-like liquid present (a large amount means it soaks clothing).  Cause: Many reasons could cause the G-tube to leak.  Solutions: Call your health care provider to discuss the amount of leakage.  Skin or scar tissue appears to be growing where tube enters skin.  Cause: Tissue growth may develop around the insertion site if the G-tube is moved or pulled on excessively.  Solutions: Secure tube with tape so that excess movement does not occur. Call your health care provider.  G-tube is clogged.  Cause: Thick formula or medication.  Solutions: Try to slowly push warm water into the tube with a large syringe. Never try to push any object into the tube to unclog it. Do not force fluid into the G-tube. If you are unable to unclog the tube, call your health care provider right away.  Tips  Head of bed (HOB) position refers to the upright position of a person's upper body. ? When giving medications or a feeding bolus, keep the [facility] up as told by your health care provider. Do this during the feeding and for 1 hour after the feeding or  medication administration. ? If continuous feedings are being given, it is best to keep the [facility] up as told by your health care provider. When ADLs (activities of daily living) are performed and the [facility] needs to be flat, be sure to turn the feeding pump off. Restart the feeding pump when the [facility] is returned to the recommended height.  Do not pull or put tension on the tube.  To prevent fluid backflow, kink the G-tube before removing the cap or disconnecting a syringe.  Check the G-tube length every day. Measure from the insertion site to the end of the G-tube. If the length is longer than previous measurements, the tube may be coming out. Call your health care provider if you notice increasing G-tube length.  Oral care, such as brushing teeth, must be continued.  You may need to remove excess air (vent) from the G-tube. Your health care provider will tell you if this is needed.  Always call your health care provider if you have  questions or problems with the G-tube. Get help right away if:  You have severe abdominal pain, tenderness, or abdominal bloating (distension).  You have nausea or vomiting.  You are constipated or have problems moving your bowels.  The G-tube insertion site is red, swollen, has a foul smell, or has yellow or brown drainage.  You have difficulty breathing or shortness of breath.  You have a fever.  You have a large amount of feeding tube residuals.  The G-tube is clogged and cannot be flushed. This information is not intended to replace advice given to you by your health care provider. Make sure you discuss any questions you have with your health care provider. Document Released: 12/14/2001 Document Revised: 03/12/2016 Document Reviewed: 06/12/2013 Elsevier Interactive Patient Education  2017 [place]. Moderate Conscious Sedation, Adult, Care After These instructions provide you with information about caring for yourself after your procedure. Your health  care provider may also give you more specific instructions. Your treatment has been planned according to current medical practices, but problems sometimes occur. Call your health care provider if you have any problems or questions after your procedure. What can I expect after the procedure? After your procedure, it is common:  To feel sleepy for several hours.  To feel clumsy and have poor balance for several hours.  To have poor judgment for several hours.  To vomit if you eat too soon.  Follow these instructions at home: For at least 24 hours after the procedure:   Do not: ? Participate in activities where you could fall or become injured. ? Drive. ? Use heavy machinery. ? Drink alcohol. ? Take sleeping pills or medicines that cause drowsiness. ? Make important decisions or sign legal documents. ? Take care of children on your own.  Rest. Eating and drinking  Follow the diet recommended by your health care provider.  If you vomit: ? Drink water, juice, or soup when you can drink without vomiting. ? Make sure you have little or no nausea before eating solid foods. General instructions  Have a responsible adult stay with you until you are awake and alert.  Take over-the-counter and prescription medicines only as told by your health care provider.  If you smoke, do not smoke without supervision.  Keep all follow-up visits as told by your health care provider. This is important. Contact a health care provider if:  You keep feeling nauseous or you keep vomiting.  You feel light-headed.  You develop a rash.  You have a fever. Get help right away if:  You have trouble breathing. This information is not intended to replace advice given to you by your health care provider. Make sure you discuss any questions you have with your health care provider. Document Released: 07/26/2013 Document Revised: 03/09/2016 Document Reviewed: 01/25/2016 Elsevier Interactive Patient  Education  2018 [place] An implanted port is a type of central line that is placed under the skin. Central lines are used to provide IV access when treatment or nutrition needs to be given through a persons veins. Implanted ports are used for long-term IV access. An implanted port may be placed because:  You need IV medicine that would be irritating to the small veins in your hands or arms.  You need long-term IV medicines, such as antibiotics.  You need IV nutrition for a long period.  You need frequent blood draws for lab tests.  You need dialysis.  Implanted ports are usually placed in the  chest area, but they can also be placed in the upper arm, the abdomen, or the leg. An implanted port has two main parts:  Reservoir. The reservoir is round and will appear as a small, raised area under your skin. The reservoir is the part where a needle is inserted to give medicines or draw blood.  Catheter. The catheter is a thin, flexible tube that extends from the reservoir. The catheter is placed into a large vein. Medicine that is inserted into the reservoir goes into the catheter and then into the vein.  How will I care for my incision site? Do not get the incision site wet. Bathe or shower as directed by your health care provider. How is my port accessed? Special steps must be taken to access the port:  Before the port is accessed, a numbing cream can be placed on the skin. This helps numb the skin over the port site.  Your health care provider uses a sterile technique to access the port. ? Your health care provider must put on a mask and sterile gloves. ? The skin over your port is cleaned carefully with an antiseptic and allowed to dry. ? The port is gently pinched between sterile gloves, and a needle is inserted into the port.  Only "non-coring" port needles should be used to access the port. Once the port is accessed, a blood return should be checked.  This helps ensure that the port is in the vein and is not clogged.  If your port needs to remain accessed for a constant infusion, a clear (transparent) bandage will be placed over the needle site. The bandage and needle will need to be changed every week, or as directed by your health care provider.  Keep the bandage covering the needle clean and dry. Do not get it wet. Follow your health care providers instructions on how to take a shower or bath while the port is accessed.  If your port does not need to stay accessed, no bandage is needed over the port.  What is flushing? Flushing helps keep the port from getting clogged. Follow your health care providers instructions on how and when to flush the port. Ports are usually flushed with saline solution or a medicine called heparin. The need for flushing will depend on how the port is used.  If the port is used for intermittent medicines or blood draws, the port will need to be flushed: ? After medicines have been given. ? After blood has been drawn. ? As part of routine maintenance.  If a constant infusion is running, the port may not need to be flushed.  How long will my port stay implanted? The port can stay in for as long as your health care provider thinks it is needed. When it is time for the port to come out, surgery will be done to remove it. The procedure is similar to the one performed when the port was put in. When should I seek immediate medical care? When you have an implanted port, you should seek immediate medical care if:  You notice a bad smell coming from the incision site.  You have swelling, redness, or drainage at the incision site.  You have more swelling or pain at the port site or the surrounding area.  You have a fever that is not controlled with medicine.  This information is not intended to replace advice given to you by your health care provider. Make sure you discuss any questions you have  with your health  care provider. Document Released: 10/05/2005 Document Revised: 03/12/2016 Document Reviewed: 06/12/2013 Elsevier Interactive Patient Education  2017 [employer].

## 2017-09-13 NOTE — Procedures (Signed)
Pre Procedure Dx: Head and Neck cancer Post Procedural Dx: Same  Successful placement of right IJ approach port-a-cath with tip at the superior caval atrial junction. The catheter is ready for immediate use.  Successful placement of a pull through G-tube. The G-tube is ready for immediate use for medications and may be used in 24 hours for the initiation of feeds.  Estimated Blood Loss: Minimal  Complications: None immediate.  Ronny Bacon, MD Pager #: [phone]

## 2017-09-13 NOTE — Discharge Instructions (Signed)
Moderate Conscious Sedation, Adult, Care After These instructions provide you with information about caring for yourself after your procedure. Your health care provider may also give you more specific instructions. Your treatment has been planned according to current medical practices, but problems sometimes occur. Call your health care provider if you have any problems or questions after your procedure. What can I expect after the procedure? After your procedure, it is common:  To feel sleepy for several hours.  To feel clumsy and have poor balance for several hours.  To have poor judgment for several hours.  To vomit if you eat too soon.  Follow these instructions at home: For at least 24 hours after the procedure:   Do not: ? Participate in activities where you could fall or become injured. ? Drive. ? Use heavy machinery. ? Drink alcohol. ? Take sleeping pills or medicines that cause drowsiness. ? Make important decisions or sign legal documents. ? Take care of children on your own.  Rest. Eating and drinking  Follow the diet recommended by your health care provider.  If you vomit: ? Drink water, juice, or soup when you can drink without vomiting. ? Make sure you have little or no nausea before eating solid foods. General instructions  Have a responsible adult stay with you until you are awake and alert.  Take over-the-counter and prescription medicines only as told by your health care provider.  If you smoke, do not smoke without supervision.  Keep all follow-up visits as told by your health care provider. This is important. Contact a health care provider if:  You keep feeling nauseous or you keep vomiting.  You feel light-headed.  You develop a rash.  You have a fever. Get help right away if:  You have trouble breathing. This information is not intended to replace advice given to you by your health care provider. Make sure you discuss any questions you have  with your health care provider. Document Released: 07/26/2013 Document Revised: 03/09/2016 Document Reviewed: 01/25/2016 Elsevier Interactive Patient Education  2018 [place] Liquid Diet A clear liquid diet means that you only have liquids that you can see through. You do not eat any food on this diet. Most people need to follow this diet for only a short time. What do I need to know about this diet?  A clear liquid is a liquid that you can see through when you hold it up to a light.  This diet does not give you all the nutrients that you need. Choose a variety of the liquids that your doctor says you can drink on this diet. That way, you will get as many nutrients as possible.  If you are not sure whether you can have certain items, ask your doctor. What can I have?  Water and flavored water.  Fruit juices that do not have pulp, such as cranberry juice and apple juice.  Tea and coffee without milk or cream.  Clear bouillon or broth.  Broth-based soups that have been strained.  Flavored gelatins.  Honey.  Sugar water.  Frozen ice or frozen ice pops that do not have any milk, yogurt, fruit pieces, or fruit pulp in them.  Clear sodas.  Clear sports drinks. The items listed above may not be a complete list of recommended liquids. Contact your food and nutrition expert (dietitian) for more options. What can I not have?  Juices that have pulp.  Milk.  Cream or cream-based soups.  Yogurt. The  items listed above may not be a complete list of liquids to avoid. Contact your food and nutrition expert for more information. Summary  A clear liquid diet is a diet that includes only liquids that you can see through.  The goal of this diet is to help you recover.  Make sure to avoid liquids with milk, cream, or pulp while you are on this diet. This information is not intended to replace advice given to you by your health care provider. Make sure you discuss any  questions you have with your health care provider. Document Released: 09/17/2008 Document Revised: 05/18/2016 Document Reviewed: 09/01/2013 Elsevier Interactive Patient Education  2017 [place].   Gastrostomy Tube Home Guide, Adult A gastrostomy tube is a tube that is surgically placed into the stomach. It is also called a G-tube. G-tubes are used when a person is unable to eat and drink enough on their own to stay healthy. The tube is inserted into the stomach through a small cut (incision) in the skin. This tube is used for:  Feeding.  Giving medication.  Gastrostomy tube care  Wash your hands with soap and water.  Remove the old dressing (if any). Some styles of G-tubes may need a dressing inserted between the skin and the G-tube. Other types of G-tubes do not require a dressing. Ask your health care provider if a dressing is needed.  Check the area where the tube enters the skin (insertion site) for redness, swelling, or pus-like (purulent) drainage. A small amount of clear or tan liquid drainage is normal. Check to make sure scar tissue (skin) is not growing around the insertion site. This could have a raised, bumpy appearance.  A cotton swab can be used to clean the skin around the tube: ? When the G-tube is first put in, a normal saline solution or water can be used to clean the skin. ? Mild soap and warm water can be used when the skin around the G-tube site has healed. ? Roll the cotton swab around the G-tube insertion site to remove any drainage or crusting at the insertion site. Stomach residuals Feeding tube residuals are the amount of liquids that are in the stomach at any given time. Residuals may be checked before giving feedings, medications, or as instructed by your health care provider.  Ask your health care provider if there are instances when you would not start tube feedings depending on the amount or type of contents withdrawn from the stomach.  Check  residuals by attaching a syringe to the G-tube and pulling back on the syringe plunger. Note the amount, and return the residual back into the stomach.  Flushing the G-tube  The G-tube should be periodically flushed with clean warm water to keep it from clogging. ? Flush the G-tube after feedings or medications. Draw up 30 mL of warm water in a syringe. Connect the syringe to the G-tube and slowly push the water into the tube. ? Do not push feedings, medications, or flushes rapidly. Flush the G-tube gently and slowly. ? Only use syringes made for G-tubes to flush medications or feedings. ? Your health care provider may want the G-tube flushed more often or with more water. If this is the case, follow your health care provider's instructions. Feedings Your health care provider will determine whether feedings are given as a bolus (a certain amount given at one time and at scheduled times) or whether feedings will be given continuously on a feeding pump.  Formulas should  be given at room temperature.  If feedings are continuous, no more than 4 hours worth of feedings should be placed in the feeding bag. This helps prevent spoilage or accidental excess infusion.  Cover and place unused formula in the refrigerator.  If feedings are continuous, stop the feedings when medications or flushes are given. Be sure to restart the feedings.  Feeding bags and syringes should be replaced as instructed by your health care provider.  Giving medication  In general, it is best if all medications are in a liquid form for G-tube administration. Liquid medications are less likely to clog the G-tube. ? Mix the liquid medication with 30 mL (or amount recommended by your health care provider) of warm water. ? Draw up the medication into the syringe. ? Attach the syringe to the G-tube and slowly push the mixture into the G-tube. ? After giving the medication, draw up 30 mL of warm water in the syringe and slowly  flush the G-tube.  For pills or capsules, check with your health care provider first before crushing medications. Some pills are not effective if they are crushed. Some capsules are sustained-release medications. ? If appropriate, crush the pill or capsule and mix with 30 mL of warm water. Using the syringe, slowly push the medication through the tube, then flush the tube with another 30 mL of tap water. G-tube problems G-tube was pulled out.  Cause: May have been pulled out accidentally.  Solutions: Cover the opening with clean dressing and tape. Call your health care provider right away. The G-tube should be put in as soon as possible (within 4 hours) so the G-tube opening (tract) does not close. The G-tube needs to be put in at a health care setting. An X-ray needs to be done to confirm placement before the G-tube can be used again.  Redness, irritation, soreness, or foul odor around the gastrostomy site.  Cause: May be caused by leakage or infection.  Solutions: Call your health care provider right away.  Large amount of leakage of fluid or mucus-like liquid present (a large amount means it soaks clothing).  Cause: Many reasons could cause the G-tube to leak.  Solutions: Call your health care provider to discuss the amount of leakage.  Skin or scar tissue appears to be growing where tube enters skin.  Cause: Tissue growth may develop around the insertion site if the G-tube is moved or pulled on excessively.  Solutions: Secure tube with tape so that excess movement does not occur. Call your health care provider.  G-tube is clogged.  Cause: Thick formula or medication.  Solutions: Try to slowly push warm water into the tube with a large syringe. Never try to push any object into the tube to unclog it. Do not force fluid into the G-tube. If you are unable to unclog the tube, call your health care provider right away.  Tips  Head of bed (HOB) position refers to the upright  position of a person's upper body. ? When giving medications or a feeding bolus, keep the [facility] up as told by your health care provider. Do this during the feeding and for 1 hour after the feeding or medication administration. ? If continuous feedings are being given, it is best to keep the [facility] up as told by your health care provider. When ADLs (activities of daily living) are performed and the [facility] needs to be flat, be sure to turn the feeding pump off. Restart the feeding pump when the [facility] is  returned to the recommended height.  Do not pull or put tension on the tube.  To prevent fluid backflow, kink the G-tube before removing the cap or disconnecting a syringe.  Check the G-tube length every day. Measure from the insertion site to the end of the G-tube. If the length is longer than previous measurements, the tube may be coming out. Call your health care provider if you notice increasing G-tube length.  Oral care, such as brushing teeth, must be continued.  You may need to remove excess air (vent) from the G-tube. Your health care provider will tell you if this is needed.  Always call your health care provider if you have questions or problems with the G-tube. Get help right away if:  You have severe abdominal pain, tenderness, or abdominal bloating (distension).  You have nausea or vomiting.  You are constipated or have problems moving your bowels.  The G-tube insertion site is red, swollen, has a foul smell, or has yellow or brown drainage.  You have difficulty breathing or shortness of breath.  You have a fever.  You have a large amount of feeding tube residuals.  The G-tube is clogged and cannot be flushed. This information is not intended to replace advice given to you by your health care provider. Make sure you discuss any questions you have with your health care provider. Document Released: 12/14/2001 Document Revised: 03/12/2016 Document Reviewed: 06/12/2013 Elsevier  Interactive Patient Education  2017 [place] Insertion, Care After This sheet gives you information about how to care for yourself after your procedure. Your health care provider may also give you more specific instructions. If you have problems or questions, contact your health care provider. What can I expect after the procedure? After your procedure, it is common to have:  Discomfort at the port insertion site.  Bruising on the skin over the port. This should improve over 3-4 days.  Follow these instructions at home: [facility] care  After your port is placed, you will get a manufacturer's information card. The card has information about your port. Keep this card with you at all times.  Take care of the port as told by your health care provider. Ask your health care provider if you or a family member can get training for taking care of the port at home. A home health care nurse may also take care of the port.  Make sure to remember what type of port you have. Incision care  Follow instructions from your health care provider about how to take care of your port insertion site. Make sure you: ? Wash your hands with soap and water before you change your bandage (dressing). If soap and water are not available, use hand sanitizer. ? Change your dressing as told by your health care provider. ? Leave skin glue in place. These skin closures may need to stay in place for 2 weeks or longer. If adhesive strip edges start to loosen and curl up, you may trim the loose edges. Do not remove adhesive strips completely unless your health care provider tells you to do that.  DO NOT use EMLA cream for 2 weeks after port placement as this cream will remove surgical glue.  Check your port insertion site every day for signs of infection. Check for: ? More redness, swelling, or pain. ? More fluid or blood. ? Warmth. ? Pus or a bad smell. General instructions  Do not take baths, swim, or  use a hot  tub until your health care provider approves.  You may shower tomorrow.    Do not lift anything that is heavier than 10 lb (4.5 kg) for a week, or as told by your health care provider.  Ask your health care provider when it is okay to: ? Return to work or school. ? Resume usual physical activities or sports.  Do not drive for 24 hours if you were given a medicine to help you relax (sedative).  Take over-the-counter and prescription medicines only as told by your health care provider.  Wear a medical alert bracelet in case of an emergency. This will tell any health care providers that you have a port.  Keep all follow-up visits as told by your health care provider. This is important. Contact a health care provider if:  You cannot flush your port with saline as directed, or you cannot draw blood from the port.  You have a fever or chills.  You have more redness, swelling, or pain around your port insertion site.  You have more fluid or blood coming from your port insertion site.  Your port insertion site feels warm to the touch.  You have pus or a bad smell coming from the port insertion site. Get help right away if:  You have chest pain or shortness of breath.  You have bleeding from your port that you cannot control. Summary  Take care of the port as told by your health care provider.  Change your dressing as told by your health care provider.  Keep all follow-up visits as told by your health care provider. This information is not intended to replace advice given to you by your health care provider. Make sure you discuss any questions you have with your health care provider. Document Released: 07/26/2013 Document Revised: 08/26/2016 Document Reviewed: 08/26/2016 Elsevier Interactive Patient Education  2017 [employer].

## 2017-09-14 ENCOUNTER — Ambulatory Visit
Admission: RE | Admit: 2017-09-14 | Discharge: 2017-09-14 | Disposition: A | Discharge status: Home / Self Care | Payer: Medicaid Other | Source: Ambulatory Visit | Attending: Radiation Oncology | Admitting: Radiation Oncology

## 2017-09-14 ENCOUNTER — Other Ambulatory Visit: Payer: Self-pay

## 2017-09-14 ENCOUNTER — Ambulatory Visit: Payer: Self-pay | Admitting: Hematology and Oncology

## 2017-09-14 DIAGNOSIS — Z510 Encounter for antineoplastic radiation therapy: Secondary | ICD-10-CM | POA: Diagnosis not present

## 2017-09-14 NOTE — Progress Notes (Signed)
Nutrition  Patient was a no show for nutrition appointment today.  Roizy Harold B. Zenia Resides, [place], [place] Registered Dietitian [phone] (pager)

## 2017-09-15 ENCOUNTER — Other Ambulatory Visit: Payer: Self-pay

## 2017-09-15 ENCOUNTER — Ambulatory Visit (HOSPITAL_BASED_OUTPATIENT_CLINIC_OR_DEPARTMENT_OTHER): Payer: Medicaid Other | Admitting: Hematology and Oncology

## 2017-09-15 ENCOUNTER — Ambulatory Visit (HOSPITAL_BASED_OUTPATIENT_CLINIC_OR_DEPARTMENT_OTHER): Payer: Medicaid Other

## 2017-09-15 ENCOUNTER — Encounter: Payer: Self-pay | Admitting: *Deleted

## 2017-09-15 ENCOUNTER — Ambulatory Visit
Admission: RE | Admit: 2017-09-15 | Discharge: 2017-09-15 | Disposition: A | Discharge status: Home / Self Care | Payer: Medicaid Other | Source: Ambulatory Visit | Attending: Radiation Oncology | Admitting: Radiation Oncology

## 2017-09-15 VITALS — BP 104/81 | HR 63 | Temp 98.4°F | Resp 17

## 2017-09-15 DIAGNOSIS — Z510 Encounter for antineoplastic radiation therapy: Secondary | ICD-10-CM | POA: Diagnosis not present

## 2017-09-15 DIAGNOSIS — Z5111 Encounter for antineoplastic chemotherapy: Secondary | ICD-10-CM

## 2017-09-15 DIAGNOSIS — C099 Malignant neoplasm of tonsil, unspecified: Secondary | ICD-10-CM

## 2017-09-15 MED ORDER — HEPARIN SOD (PORK) LOCK FLUSH 100 UNIT/ML IV SOLN
500.0000 [IU] | Freq: Once | INTRAVENOUS | Status: AC | PRN
  Administered 2017-09-15: 500 [IU]
  Filled 2017-09-15: qty 5

## 2017-09-15 MED ORDER — PALONOSETRON HCL INJECTION 0.25 MG/5ML
0.2500 mg | Freq: Once | INTRAVENOUS | Status: AC
  Administered 2017-09-15: 0.25 mg via INTRAVENOUS

## 2017-09-15 MED ORDER — SODIUM CHLORIDE 0.9% FLUSH
10.0000 mL | INTRAVENOUS | Status: DC | PRN
  Administered 2017-09-15: 10 mL
  Filled 2017-09-15: qty 10

## 2017-09-15 MED ORDER — FAMOTIDINE IN NACL 20-0.9 MG/50ML-% IV SOLN
INTRAVENOUS | Status: AC
  Filled 2017-09-15: qty 50

## 2017-09-15 MED ORDER — SODIUM CHLORIDE 0.9 % IV SOLN
224.8000 mg | Freq: Once | INTRAVENOUS | Status: AC
  Administered 2017-09-15: 220 mg via INTRAVENOUS
  Filled 2017-09-15: qty 22

## 2017-09-15 MED ORDER — SODIUM CHLORIDE 0.9 % IV SOLN
45.0000 mg/m2 | Freq: Once | INTRAVENOUS | Status: AC
  Administered 2017-09-15: 78 mg via INTRAVENOUS
  Filled 2017-09-15: qty 13

## 2017-09-15 MED ORDER — DIPHENHYDRAMINE HCL 50 MG/ML IJ SOLN
50.0000 mg | Freq: Once | INTRAMUSCULAR | Status: AC
  Administered 2017-09-15: 50 mg via INTRAVENOUS

## 2017-09-15 MED ORDER — DIPHENHYDRAMINE HCL 50 MG/ML IJ SOLN
INTRAMUSCULAR | Status: AC
  Filled 2017-09-15: qty 1

## 2017-09-15 MED ORDER — SODIUM CHLORIDE 0.9 % IV SOLN
Freq: Once | INTRAVENOUS | Status: AC
  Administered 2017-09-15: 15:00:00 via INTRAVENOUS

## 2017-09-15 MED ORDER — FAMOTIDINE IN NACL 20-0.9 MG/50ML-% IV SOLN
20.0000 mg | Freq: Once | INTRAVENOUS | Status: AC
  Administered 2017-09-15: 20 mg via INTRAVENOUS

## 2017-09-15 MED ORDER — DEXAMETHASONE SODIUM PHOSPHATE 10 MG/ML IJ SOLN
INTRAMUSCULAR | Status: AC
  Filled 2017-09-15: qty 1

## 2017-09-15 MED ORDER — SODIUM CHLORIDE 0.9 % IV SOLN
20.0000 mg | Freq: Once | INTRAVENOUS | Status: AC
  Administered 2017-09-15: 20 mg via INTRAVENOUS
  Filled 2017-09-15: qty 2

## 2017-09-15 MED ORDER — MORPHINE SULFATE (CONCENTRATE) 10 MG /0.5 ML PO SOLN: 10.0000 mg | ORAL | 0 refills | Status: DC | PRN

## 2017-09-15 NOTE — Progress Notes (Signed)
Ok to treat with bilirubin 1.7 per Dr. Lebron Conners. Came to see pt in infusion.  Cyndia Bent RN

## 2017-09-15 NOTE — Patient Instructions (Signed)
[facility] Discharge Instructions for Patients Receiving Chemotherapy  Today you received the following chemotherapy agents:  Taxol, Carboplatin  To help prevent nausea and vomiting after your treatment, we encourage you to take your nausea medication as prescribed.   If you develop nausea and vomiting that is not controlled by your nausea medication, call the clinic.   BELOW ARE SYMPTOMS THAT SHOULD BE REPORTED IMMEDIATELY:  *FEVER GREATER THAN 100.5 F  *CHILLS WITH OR WITHOUT FEVER  NAUSEA AND VOMITING THAT IS NOT CONTROLLED WITH YOUR NAUSEA MEDICATION  *UNUSUAL SHORTNESS OF BREATH  *UNUSUAL BRUISING OR BLEEDING  TENDERNESS IN MOUTH AND THROAT WITH OR WITHOUT PRESENCE OF ULCERS  *URINARY PROBLEMS  *BOWEL PROBLEMS  UNUSUAL RASH Items with * indicate a potential emergency and should be followed up as soon as possible.  Feel free to call the clinic should you have any questions or concerns. The clinic phone number is [phone].  Please show the CHEMO ALERT CARD at check-in to the Emergency Department and triage nurse.   

## 2017-09-15 NOTE — Progress Notes (Signed)
Oncology Nurse Navigator Documentation  Met with Mr. Lorusso following RT to check on PEG s/p Monday's placement. He c/o abdominal discomfort particularly when getting up from bed.  I recognized this as expected post-placement, encouraged him to try side roll when elevating from prone position. I checked PEG site, it was clean, lacked discharge, no erythema. I cleaned site with NS, redressed. I reviewed procedure for bolus water flushes, he provided accurate return demonstration. I encouraged him to call me for additional PEG support as needed.  He voiced understanding.  Gayleen Orem, RN, BSN, [place] Oncology Nurse [place] at [place] [phone]

## 2017-09-16 ENCOUNTER — Ambulatory Visit
Admission: RE | Admit: 2017-09-16 | Discharge: 2017-09-16 | Disposition: A | Discharge status: Home / Self Care | Payer: Medicaid Other | Source: Ambulatory Visit | Attending: Radiation Oncology | Admitting: Radiation Oncology

## 2017-09-16 ENCOUNTER — Telehealth: Payer: Self-pay | Admitting: Hematology and Oncology

## 2017-09-16 DIAGNOSIS — Z510 Encounter for antineoplastic radiation therapy: Secondary | ICD-10-CM | POA: Diagnosis not present

## 2017-09-16 NOTE — Telephone Encounter (Signed)
Scheduled appt per 11/28 los - patient is aware of appt added .

## 2017-09-17 ENCOUNTER — Ambulatory Visit: Payer: Medicaid Other | Admitting: Nutrition

## 2017-09-17 ENCOUNTER — Ambulatory Visit
Admission: RE | Admit: 2017-09-17 | Discharge: 2017-09-17 | Disposition: A | Discharge status: Home / Self Care | Payer: Medicaid Other | Source: Ambulatory Visit | Attending: Radiation Oncology | Admitting: Radiation Oncology

## 2017-09-17 DIAGNOSIS — Z510 Encounter for antineoplastic radiation therapy: Secondary | ICD-10-CM | POA: Diagnosis not present

## 2017-09-17 DIAGNOSIS — C099 Malignant neoplasm of tonsil, unspecified: Secondary | ICD-10-CM

## 2017-09-17 MED ORDER — OSMOLITE 1.5 CAL PO LIQD: ORAL | 0 refills | Status: DC

## 2017-09-17 NOTE — Progress Notes (Signed)
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 Current weight documented 128 pounds increased from 123.2 pounds on November 13. Patient reports he is status post feeding tube placement and he has started to use nutrition through the PEG. Reports his mouth is sore and it is difficult to eat.  He is tolerating some soft foods and has oral nutrition supplements. Patient wishes to begin using his tube for nutrition support.  Estimated nutrition needs: 1745-1945 calories, 75-90 grams protein, 2.0 L fluid.  Nutrition diagnosis: Malnutrition continues.  5 bottles Osmolite 1.5 provides 1775 cal and 74.5 g protein.  Intervention: Patient educated to give one bottle Osmolite 1.5 every 3 hours with 60 mL of free water before and after to total 5 bottles a day.  Patient will drink a minimum of 720 cc free water daily Reviewed how to give tube feedings and free water flushes. Recommended patient continue to eat soft foods and drink oral nutrition supplements as tolerated. Questions answered.  Teach back method used.  Fact sheets provided.  Monitoring, evaluation, goals: Patient will tolerate tube feeding plus oral intake to promote weight gain in healing.  Next visit: Tuesday December 4.  **Disclaimer: This note was dictated with voice recognition software. Similar sounding words can inadvertently be transcribed and this note may contain transcription errors which may not have been corrected upon publication of note.**

## 2017-09-18 ENCOUNTER — Telehealth: Payer: Self-pay | Admitting: Hematology and Oncology

## 2017-09-18 NOTE — Telephone Encounter (Signed)
Scheduled appt per 11/30 sch msg - left voicemail for patient regarding appt

## 2017-09-20 ENCOUNTER — Ambulatory Visit
Admission: RE | Admit: 2017-09-20 | Discharge: 2017-09-20 | Disposition: A | Discharge status: Home / Self Care | Payer: Medicaid Other | Source: Ambulatory Visit | Attending: Radiation Oncology | Admitting: Radiation Oncology

## 2017-09-20 ENCOUNTER — Encounter: Payer: Self-pay | Admitting: *Deleted

## 2017-09-20 DIAGNOSIS — Z510 Encounter for antineoplastic radiation therapy: Secondary | ICD-10-CM | POA: Diagnosis not present

## 2017-09-20 NOTE — Progress Notes (Signed)
Oncology Nurse Navigator Documentation  Patient file review/update, Navigator Spreadsheet update.  Gayleen Orem, RN, BSN, [place] Oncology Nurse [place] at [place] [phone]

## 2017-09-21 ENCOUNTER — Ambulatory Visit (HOSPITAL_COMMUNITY): Payer: Medicaid - Dental | Admitting: Dentistry

## 2017-09-21 ENCOUNTER — Encounter: Payer: Self-pay | Admitting: Hematology and Oncology

## 2017-09-21 ENCOUNTER — Ambulatory Visit (HOSPITAL_BASED_OUTPATIENT_CLINIC_OR_DEPARTMENT_OTHER): Payer: Medicaid Other

## 2017-09-21 ENCOUNTER — Encounter (HOSPITAL_COMMUNITY): Payer: Self-pay | Admitting: Dentistry

## 2017-09-21 ENCOUNTER — Ambulatory Visit: Payer: Medicaid Other

## 2017-09-21 ENCOUNTER — Ambulatory Visit
Admission: RE | Admit: 2017-09-21 | Discharge: 2017-09-21 | Disposition: A | Discharge status: Home / Self Care | Payer: Medicaid Other | Source: Ambulatory Visit | Attending: Radiation Oncology | Admitting: Radiation Oncology

## 2017-09-21 ENCOUNTER — Ambulatory Visit (HOSPITAL_BASED_OUTPATIENT_CLINIC_OR_DEPARTMENT_OTHER): Payer: Medicaid Other | Admitting: Hematology and Oncology

## 2017-09-21 VITALS — BP 94/58 | HR 68 | Temp 98.9°F | Wt 120.0 lb

## 2017-09-21 VITALS — BP 102/64 | HR 67 | Temp 98.7°F | Resp 16 | Wt 124.6 lb

## 2017-09-21 DIAGNOSIS — K08199 Complete loss of teeth due to other specified cause, unspecified class: Secondary | ICD-10-CM

## 2017-09-21 DIAGNOSIS — C099 Malignant neoplasm of tonsil, unspecified: Secondary | ICD-10-CM

## 2017-09-21 DIAGNOSIS — K1233 Oral mucositis (ulcerative) due to radiation: Secondary | ICD-10-CM

## 2017-09-21 DIAGNOSIS — K08109 Complete loss of teeth, unspecified cause, unspecified class: Secondary | ICD-10-CM

## 2017-09-21 DIAGNOSIS — R252 Cramp and spasm: Secondary | ICD-10-CM

## 2017-09-21 DIAGNOSIS — M1230 Palindromic rheumatism, unspecified site: Secondary | ICD-10-CM

## 2017-09-21 DIAGNOSIS — K117 Disturbances of salivary secretion: Secondary | ICD-10-CM | POA: Diagnosis not present

## 2017-09-21 DIAGNOSIS — R1310 Dysphagia, unspecified: Secondary | ICD-10-CM

## 2017-09-21 DIAGNOSIS — R432 Parageusia: Secondary | ICD-10-CM

## 2017-09-21 DIAGNOSIS — R682 Dry mouth, unspecified: Secondary | ICD-10-CM

## 2017-09-21 DIAGNOSIS — K0820 Unspecified atrophy of edentulous alveolar ridge: Secondary | ICD-10-CM

## 2017-09-21 DIAGNOSIS — Z510 Encounter for antineoplastic radiation therapy: Secondary | ICD-10-CM | POA: Diagnosis not present

## 2017-09-21 LAB — CBC WITH DIFFERENTIAL/PLATELET
BASO%: 0.8 % (ref 0.0–2.0)
BASOS ABS: 0 10*3/uL (ref 0.0–0.1)
EOS ABS: 0 10*3/uL (ref 0.0–0.5)
EOS%: 2.1 % (ref 0.0–7.0)
HCT: 33.3 % — ABNORMAL LOW (ref 38.4–49.9)
HGB: 11 g/dL — ABNORMAL LOW (ref 13.0–17.1)
LYMPH%: 21.6 % (ref 14.0–49.0)
MCH: 29 pg (ref 27.2–33.4)
MCHC: 33.1 g/dL (ref 32.0–36.0)
MCV: 87.5 fL (ref 79.3–98.0)
MONO#: 0.3 10*3/uL (ref 0.1–0.9)
MONO%: 14.5 % — AB (ref 0.0–14.0)
NEUT#: 1.5 10*3/uL (ref 1.5–6.5)
NEUT%: 61 % (ref 39.0–75.0)
Platelets: 158 10*3/uL (ref 140–400)
RBC: 3.81 10*6/uL — AB (ref 4.20–5.82)
RDW: 13.7 % (ref 11.0–14.6)
WBC: 2.4 10*3/uL — ABNORMAL LOW (ref 4.0–10.3)
lymph#: 0.5 10*3/uL — ABNORMAL LOW (ref 0.9–3.3)

## 2017-09-21 LAB — MAGNESIUM: Magnesium: 1.7 mg/dl (ref 1.5–2.5)

## 2017-09-21 LAB — COMPREHENSIVE METABOLIC PANEL
ALK PHOS: 70 U/L (ref 40–150)
ALT: 9 U/L (ref 0–55)
AST: 15 U/L (ref 5–34)
Albumin: 3.5 g/dL (ref 3.5–5.0)
Anion Gap: 8 mEq/L (ref 3–11)
BUN: 10.2 mg/dL (ref 7.0–26.0)
CO2: 29 meq/L (ref 22–29)
Calcium: 9.3 mg/dL (ref 8.4–10.4)
Chloride: 99 mEq/L (ref 98–109)
Creatinine: 0.7 mg/dL (ref 0.7–1.3)
GLUCOSE: 99 mg/dL (ref 70–140)
POTASSIUM: 4.1 meq/L (ref 3.5–5.1)
SODIUM: 136 meq/L (ref 136–145)
Total Bilirubin: 0.26 mg/dL (ref 0.20–1.20)
Total Protein: 7 g/dL (ref 6.4–8.3)

## 2017-09-21 NOTE — Progress Notes (Signed)
Nutrition Follow-up:  Patient receiving radiation and chemotherapy for tonsil cancer.    Met with patient following radiation therapy today.  Patient reports that he is mainly drinking 2 ensure plus per day and drinking "some" water, not able to quantify.  Patient reports that he is using 5 cartons of osmolite 1.5 via PEG tube daily.  Reports that he is flushing tube with 31m of water before and after each carton.  Reports that he is spreading cartons out every 3 hours.  Reports sometimes he misses a can due to falling asleep.  Reports he is running low on tube feeding cartons and [place]od[place]called [place] yesterday [place] address.    Patient reports no issues with nausea.  Reports had normal bowel movement this am and this is normal for him  Medications: reviewed  Labs: reviewed  Anthropometrics:   Noted weight today 120 lb (?dentist visit) decreased from 128 lb on 09/08/17   Estimated Energy Needs  Kcals: 15726-2035calories/d Protein: 75-90 g/d Fluid: 2.0 L/d  NUTRITION DIAGNOSIS: Malnutrition continues  MALNUTRITION DIAGNOSIS: Severe malnutrition continues   INTERVENTION:   Encouraged patient to continue using 5 cartons of osmolite 1.5 via PEG tube every 3 hours.  Patient knows to flush tube with 683mof water before and after each feeding. Reviewed with patient that he needs additional 72075mf water daily, minimum orally or via PEG tube.  Encouraged patient to continue to drink ensure plus 2 per day for additional calories and protein.   Tube feeding and ensure plus provide: 2477 calories/d, 101 g of protein, 2220m28mee water (formula, free water).   Patient currently not using feeding tube for medication administration but thinks he may have to start using it.  Discussed proper technique for giving medications via tube.   [place] patient needing supply of osmolite 1.5.      MONITORING, EVALUATION, GOAL: patient will tolerate tube  feeding plus oral intake to promote weight gain.   NEXT VISIT: Tuesday, December 11, reminded patient of this appointment  Melvie Paglia B. AlleZenia Resides, [place]istered Dietitian [phone])

## 2017-09-21 NOTE — Progress Notes (Signed)
09/21/2017  Patient Name:   [name] Date of Birth:   07/04/1961 Medical Record Number: [id number]  BP (!) 94/58 (BP Location: Left Arm)   Pulse 68   Temp 98.9 F (37.2 C) (Oral)   Wt 120 lb (54.4 kg)   BMI 18.79 kg/m   [name] presents for oral examination during chemoradiation therapy. Patient has completed 14/35 radiation treatments. Three chemotherapy treatments.  REVIEW OF CHIEF COMPLAINTS:  DRY MOUTH: Yes HARD TO SWALLOW: yes, at times  HURT TO SWALLOW: yes, at times TASTE CHANGES: taste is almost gone SORES IN MOUTH: Denies sores in the mouth TRISMUS: Left trismus symptoms. Decrease MIO from 55 to 44 mm.  The patient is to continue exercises as directed. WEIGHT: 120 lbs.  HOME OH REGIMEN:  BRUSHING: Edentulous. Continue brushing tongue twice daily. FLOSSING: Not applicable RINSING: Using salt water and baking soda rinses and Biotene rinses FLUORIDE: Not applicable TRISMUS EXERCISES:  Maximum interincisal opening:  44 mm   DENTAL EXAM:  Oral Hygiene:(PLAQUE): edentulous. Some plaque on tongue. LOCATION OF MUCOSITIS: Back of throat and left buccal mucosa. DESCRIPTION OF SALIVA: Decreased saliva ANY EXPOSED BONE: None noted OTHER WATCHED AREAS: Previous extraction sites. DX: Xerostomia, Dysgeusia, Dysphagia, Odynophagia, Weight Loss, Trimus, Mucositis and Edentulous  RECOMMENDATIONS: 1. Brush tongue daily. 2. Use trismus exercises as directed. 3. Use Biotene Rinse or salt water/baking soda rinses. 4. Multiple sips of water as needed. 5. Return to clinic in two months for oral exam after radiation therapy. Call if problems before then.  Lenn Cal, DDS

## 2017-09-21 NOTE — Patient Instructions (Signed)
RECOMMENDATIONS: 1. Brush tongue daily. 2. Use trismus exercises as directed. 3. Use Biotene Rinse or salt water/baking soda rinses. 4. Multiple sips of water as needed. 5. Return to clinic in two months for oral exam after radiation therapy. Call if problems before then.  Lenn Cal, DDS

## 2017-09-22 ENCOUNTER — Other Ambulatory Visit: Payer: Medicaid Other

## 2017-09-22 ENCOUNTER — Ambulatory Visit
Admission: RE | Admit: 2017-09-22 | Discharge: 2017-09-22 | Disposition: A | Discharge status: Home / Self Care | Payer: Medicaid Other | Source: Ambulatory Visit | Attending: Radiation Oncology | Admitting: Radiation Oncology

## 2017-09-22 ENCOUNTER — Ambulatory Visit (HOSPITAL_BASED_OUTPATIENT_CLINIC_OR_DEPARTMENT_OTHER): Payer: Medicaid Other

## 2017-09-22 VITALS — BP 92/61 | HR 65 | Temp 99.0°F | Resp 18

## 2017-09-22 DIAGNOSIS — C779 Secondary and unspecified malignant neoplasm of lymph node, unspecified: Secondary | ICD-10-CM

## 2017-09-22 DIAGNOSIS — C099 Malignant neoplasm of tonsil, unspecified: Secondary | ICD-10-CM | POA: Diagnosis not present

## 2017-09-22 DIAGNOSIS — Z5111 Encounter for antineoplastic chemotherapy: Secondary | ICD-10-CM

## 2017-09-22 DIAGNOSIS — Z510 Encounter for antineoplastic radiation therapy: Secondary | ICD-10-CM | POA: Diagnosis not present

## 2017-09-22 LAB — PHOSPHORUS: Phosphorus, Ser: 3.7 mg/dL (ref 2.5–4.5)

## 2017-09-22 MED ORDER — SODIUM CHLORIDE 0.9 % IV SOLN
20.0000 mg | Freq: Once | INTRAVENOUS | Status: AC
  Administered 2017-09-22: 20 mg via INTRAVENOUS
  Filled 2017-09-22: qty 2

## 2017-09-22 MED ORDER — PALONOSETRON HCL INJECTION 0.25 MG/5ML
0.2500 mg | Freq: Once | INTRAVENOUS | Status: AC
  Administered 2017-09-22: 0.25 mg via INTRAVENOUS

## 2017-09-22 MED ORDER — FAMOTIDINE IN NACL 20-0.9 MG/50ML-% IV SOLN
INTRAVENOUS | Status: AC
  Filled 2017-09-22: qty 50

## 2017-09-22 MED ORDER — SODIUM CHLORIDE 0.9 % IV SOLN
Freq: Once | INTRAVENOUS | Status: AC
  Administered 2017-09-22: 14:00:00 via INTRAVENOUS

## 2017-09-22 MED ORDER — SODIUM CHLORIDE 0.9 % IV SOLN
224.8000 mg | Freq: Once | INTRAVENOUS | Status: AC
  Administered 2017-09-22: 220 mg via INTRAVENOUS
  Filled 2017-09-22: qty 22

## 2017-09-22 MED ORDER — HEPARIN SOD (PORK) LOCK FLUSH 100 UNIT/ML IV SOLN
500.0000 [IU] | Freq: Once | INTRAVENOUS | Status: AC | PRN
  Administered 2017-09-22: 500 [IU]
  Filled 2017-09-22: qty 5

## 2017-09-22 MED ORDER — FLUCONAZOLE 200 MG PO TABS: 200.0000 mg | ORAL_TABLET | Freq: Every day | ORAL | 0 refills | Status: DC

## 2017-09-22 MED ORDER — SODIUM CHLORIDE 0.9 % IV SOLN
45.0000 mg/m2 | Freq: Once | INTRAVENOUS | Status: AC
  Administered 2017-09-22: 78 mg via INTRAVENOUS
  Filled 2017-09-22: qty 13

## 2017-09-22 MED ORDER — DIPHENHYDRAMINE HCL 50 MG/ML IJ SOLN
INTRAMUSCULAR | Status: AC
  Filled 2017-09-22: qty 1

## 2017-09-22 MED ORDER — FAMOTIDINE IN NACL 20-0.9 MG/50ML-% IV SOLN
20.0000 mg | Freq: Once | INTRAVENOUS | Status: AC
  Administered 2017-09-22: 20 mg via INTRAVENOUS

## 2017-09-22 MED ORDER — PALONOSETRON HCL INJECTION 0.25 MG/5ML
INTRAVENOUS | Status: AC
  Filled 2017-09-22: qty 5

## 2017-09-22 MED ORDER — SODIUM CHLORIDE 0.9% FLUSH
10.0000 mL | INTRAVENOUS | Status: DC | PRN
  Administered 2017-09-22: 10 mL
  Filled 2017-09-22: qty 10

## 2017-09-22 MED ORDER — DIPHENHYDRAMINE HCL 50 MG/ML IJ SOLN
50.0000 mg | Freq: Once | INTRAMUSCULAR | Status: AC
  Administered 2017-09-22: 50 mg via INTRAVENOUS

## 2017-09-22 NOTE — Patient Instructions (Signed)
   [facility] Discharge Instructions for Patients Receiving Chemotherapy  Today you received the following chemotherapy agents Taxol and Carboplatin   To help prevent nausea and vomiting after your treatment, we encourage you to take your nausea medication as directed.    If you develop nausea and vomiting that is not controlled by your nausea medication, call the clinic.   BELOW ARE SYMPTOMS THAT SHOULD BE REPORTED IMMEDIATELY:  *FEVER GREATER THAN 100.5 F  *CHILLS WITH OR WITHOUT FEVER  NAUSEA AND VOMITING THAT IS NOT CONTROLLED WITH YOUR NAUSEA MEDICATION  *UNUSUAL SHORTNESS OF BREATH  *UNUSUAL BRUISING OR BLEEDING  TENDERNESS IN MOUTH AND THROAT WITH OR WITHOUT PRESENCE OF ULCERS  *URINARY PROBLEMS  *BOWEL PROBLEMS  UNUSUAL RASH Items with * indicate a potential emergency and should be followed up as soon as possible.  Feel free to call the clinic should you have any questions or concerns. The clinic phone number is [phone].  Please show the CHEMO ALERT CARD at check-in to the Emergency Department and triage nurse.   

## 2017-09-23 ENCOUNTER — Ambulatory Visit
Admission: RE | Admit: 2017-09-23 | Discharge: 2017-09-23 | Disposition: A | Discharge status: Home / Self Care | Payer: Medicaid Other | Source: Ambulatory Visit | Attending: Radiation Oncology | Admitting: Radiation Oncology

## 2017-09-23 DIAGNOSIS — Z510 Encounter for antineoplastic radiation therapy: Secondary | ICD-10-CM | POA: Diagnosis not present

## 2017-09-24 ENCOUNTER — Encounter: Payer: Self-pay | Admitting: *Deleted

## 2017-09-24 ENCOUNTER — Ambulatory Visit
Admission: RE | Admit: 2017-09-24 | Discharge: 2017-09-24 | Disposition: A | Discharge status: Home / Self Care | Payer: Medicaid Other | Source: Ambulatory Visit | Attending: Radiation Oncology | Admitting: Radiation Oncology

## 2017-09-24 DIAGNOSIS — Z510 Encounter for antineoplastic radiation therapy: Secondary | ICD-10-CM | POA: Diagnosis not present

## 2017-09-25 NOTE — Progress Notes (Signed)
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  Imaging demonstrates locally advanced disease without evidence of metastatic disease in the lymph nodes by PET/CT.  No evidence of distal metastasis. Patient underwent dental evaluation and surgery due to poor dentition.    Subsequently, patient was started on systemic chemotherapy with weekly carboplatin and paclitaxel administered concurrently with radiation.  He appears to have tolerated first week reasonably well, but is starting to develop some mucositis with odynophagia.  Clinical evaluation and lab work are permissive to proceed with the next week of systemic therapy.   Plan:  --Have reviewed use of available pain medications and antacids. --Proceed with the second week of systemic chemotherapy --Return to my clinic in 1 week for labs, toxicity monitoring, and continuation of systemic therapy  Voice recognition software was used and creation of this note. Despite my best effort at editing the text, some misspelling/errors may have occurred.  Orders Placed This Encounter  Procedures  . 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 . The patient knows to call the clinic with any problems, questions or concerns.  This note was electronically signed.    History of Presenting Illness [name] is a 56 y.o. male followed in the [facility] for diagnosis of squamous cell carcinoma of the left tonsil. Patient initially presented with sore throat onset around the middle of June. He  first presented to the Urgent Care on 04/26/17 after 3 weeks of symptoms. The sore throat was also accompanied by the globus sensation in the neck. He was treated for uvulitis initially, wihtout significant improvement despite two additional visits to the Urgent Care. On his last visit with the Urgent CareENT referral was made.  The patient saw Dr. Janace Hoard from [facility] and underwent laryngoscopy on 07/16/17 with biopsy which confirmed presence of squamous cell carcinoma. Please see results of additional evaluation in the oncological history below. At the present time, additional assessment is pending. Patient was seen by dentistry and multiple dental extractions were recommended. He is scheduled for surgical removal of his teeth on 08/09/17.   Current symptoms include no fevers, chills, night sweats. 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 Denies chest pain, shortness of breath, or cough. No nausea, vomiting, abdominal pain, diarrhea, or constipation. No urinary or neurological symptoms.  Patient is an active tobacco user, smoking at least 1ppd x 30 years. In addition, patient is habitual daily alcohol userdrinking as much as 1 beer (a 40oz) and 1 pint of gin per day. Patient has been treated for alcohol dependency in the past. His own presentation trace some degree of denial/attempts at minimizing his alcohol dependency as initially he only mentioned drinking 1 beer per day. His family has corrected him, and he acknowledged a higher level of alcohol use. In the past, he has stopped drinking, but he does get withdrawal symptoms rapidly.  Patient returns to the clinic to continue systemic therapy with carboplatin and paclitaxel administered concurrently with radiation  therapy that is also scheduled to start today.  In the interim, patient denies any nausea.  Denies any new neuropathy.  He does complain of some burning and soreness in the throat and also pain  when swallowing in the morning which improves as the day goes through.  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 There are foci suspicious but not definitive for invasion. p16 immunohistochemistry is negative in the neoplastic cells. FINAL DIAGNOSIS       07/27/2017 Imaging    CT neck: Indistinct tonsillar enlargement on the left in the region of left tonsillar mass biopsy. This could be a combination of post biopsy edema and residual mass. Margins are not discrete and accurate measurement cannot be performed. This is estimated at about 3 cm in size. No evidence of metastatic adenopathy.      08/16/2017 Imaging    PET-CT: Hypermetabolic disease in the left palatal fossa.  Indeterminate level 3 lymph node which may be reactive considering recent dental extractions.      09/01/2017 -  Radiation Therapy         09/01/2017 -  Chemotherapy    Carbboplatin AUC2,d1 + Paclitaxel '45mg'$ /m2, d1 Q7d --Week #1, 38/18/29: Complicated by some burning/soreness in the throat. --Week #2, 09/08/17:      09/13/2017 Procedure    Successful fluoroscopic insertion of a 20-French pull-through gastrostomy tube. Successful placement of a right internal jugular approach power injectable Port-A-Cath.       Medical History: Past Medical History:  Diagnosis Date  . Cancer [facility])    left tonsil cancer    Surgical History: Past Surgical History:  Procedure Laterality Date  . IR FLUORO GUIDE PORT INSERTION RIGHT  09/13/2017  . IR GASTROSTOMY TUBE MOD SED  09/13/2017  . IR US GUIDE VASC ACCESS RIGHT  09/13/2017  . MULTIPLE EXTRACTIONS WITH ALVEOLOPLASTY N/A 08/09/2017   Procedure: Extraction of tooth #'s 1-6, 11-17,21,22,26-30  and 32 with alveoloplasty and bilateral mandibular tori reductions;  Surgeon: Lenn Cal, DDS;  Location: [facility] ORS;  Service: Oral Surgery;  Laterality: N/A;  . tonsil biopsy     left tonsil    Family History: Family History  Problem Relation Age of Onset  . Diabetes Mother     Social History: Social History   Socioeconomic History  . Marital status: Single    Spouse name: Not on file  . Number of children: 1  . Years of education: Not on file  . Highest education level: Not on file  Social Needs  . Financial resource strain: Not on file  . Food insecurity - worry: Not on file  . Food insecurity - inability: Not on file  . Transportation needs - medical: Not on file  . Transportation needs - non-medical: Not on file  Occupational History  . Occupation: Disabled  Tobacco Use  . Smoking status: Current Every Day Smoker    Packs/day: 1.00    Years: 30.00    Pack years: 30.00  . Smokeless tobacco: Never Used  Substance and Sexual Activity  . Alcohol use: Yes    Alcohol/week: 4.2 oz    Types: 7 Cans of beer per week    Comment: 1 pint daily   .  Drug use: No    Comment: he has a past history of cocaine abuse, he denies current use  . Sexual activity: Not on file  Other Topics Concern  . Not on file  Social History Narrative  . Not on file    Allergies: No Known Allergies  Medications:  Current Outpatient Medications  Medication Sig Dispense Refill  . acetaminophen (TYLENOL) 325 MG tablet Take 650 mg by mouth every 6 (six) hours as needed for moderate pain.     Marland Kitchen dexamethasone (DECADRON) 4 MG tablet Take 2 tablets (8 mg total) daily by mouth. Start the day after chemotherapy for 2 days. 30 tablet 1  . lidocaine (XYLOCAINE) 2 % solution Mix 1 part 2%viscous lidocaine,1part H2O.Swish and/or swallow 91m of this mixture,348m before meals and at bedtime, up to QID 100 mL 5  . lidocaine-prilocaine (EMLA) cream Apply to affected area once 30 g 3  . nicotine  polacrilex (NICOTINE MINI) 4 MG lozenge Take 1 lozenge (4 mg total) by mouth as needed for smoking cessation. 100 tablet 0  . ondansetron (ZOFRAN) 8 MG tablet Take 1 tablet (8 mg total) 2 (two) times daily as needed by mouth for refractory nausea / vomiting. Start on day 3 after chemo. 30 tablet 1  . prochlorperazine (COMPAZINE) 10 MG tablet Take 1 tablet (10 mg total) every 6 (six) hours as needed by mouth (Nausea or vomiting). 30 tablet 1  . fluconazole (DIFLUCAN) 200 MG tablet Take 1 tablet (200 mg total) by mouth daily for 14 days. 14 tablet 0  . LORazepam (ATIVAN) 0.5 MG tablet Take 1 tablet (0.5 mg total) every 6 (six) hours as needed by mouth (Nausea or vomiting). 30 tablet 0  . Morphine Sulfate (MORPHINE CONCENTRATE) 10 mg / 0.5 ml concentrated solution Place 0.5 mLs (10 mg total) into feeding tube every 4 (four) hours as needed for severe pain. 1 Bottle 0  . Nutritional Supplements (FEEDING SUPPLEMENT, OSMOLITE 1.5 CAL,) LIQD Give 1 bottle Osmolite 1.5 via PEG 5 times daily with 60 mL free water before and after bolus. Drink or flush tube with additional 720 mL free water 3 times daily. 5 Bottle 0   No current facility-administered medications for this visit.     Review of Systems: Review of Systems  HENT:   Positive for sore throat and trouble swallowing.   All other systems reviewed and are negative.    PHYSICAL EXAMINATION Blood pressure 117/81, pulse (!) 57, temperature 98.8 F (37.1 C), temperature source Oral, resp. rate 18, height '5\' 7"'$  (1.702 m), weight 128 lb 3.2 oz (58.2 kg), SpO2 100 %.  ECOG PERFORMANCE STATUS: 1 - Symptomatic but completely ambulatory  Physical Exam  Constitutional: He is oriented to person, place, and time and well-developed, well-nourished, and in no distress. No distress.  HENT:  Head: Normocephalic and atraumatic.  Mouth/Throat: Uvula is midline, oropharynx is clear and moist and mucous membranes are normal. He does not have dentures. Oral  lesions present. Abnormal dentition. Dental caries present. No dental abscesses or uvula swelling. No oropharyngeal exudate or tonsillar abscesses.  Eyes: Conjunctivae and EOM are normal. Pupils are equal, round, and reactive to light. No scleral icterus.  Neck: No thyromegaly present.  Cardiovascular: Normal rate, regular rhythm, normal heart sounds and intact distal pulses.  No murmur heard. Pulmonary/Chest: Effort normal and breath sounds normal. No respiratory distress. He has no wheezes. He has no rales.  Abdominal: Soft. Bowel sounds are normal. He exhibits no distension and no mass.  There is no tenderness. There is no rebound and no guarding.  Musculoskeletal: Normal range of motion. He exhibits no edema.  Lymphadenopathy:    He has no cervical adenopathy.  Neurological: He is alert and oriented to person, place, and time. He has normal reflexes. No cranial nerve deficit. Coordination normal.  Skin: Skin is warm and dry. No rash noted. He is not diaphoretic. No erythema.     LABORATORY DATA: I have personally reviewed the data as listed: Appointment on 09/08/2017  Component Date Value Ref Range Status  . Phosphorus, Ser 09/08/2017 3.9  2.5 - 4.5 mg/dL Final  . Magnesium 09/08/2017 2.0  1.5 - 2.5 mg/dl Final  . Sodium 09/08/2017 138  136 - 145 mEq/L Final  . Potassium 09/08/2017 4.1  3.5 - 5.1 mEq/L Final  . Chloride 09/08/2017 101  98 - 109 mEq/L Final  . CO2 09/08/2017 28  22 - 29 mEq/L Final  . Glucose 09/08/2017 104  70 - 140 mg/dl Final   Glucose reference range is for nonfasting patients. Fasting glucose reference range is 70- 100.  Marland Kitchen BUN 09/08/2017 12.1  7.0 - 26.0 mg/dL Final  . Creatinine 09/08/2017 0.8  0.7 - 1.3 mg/dL Final  . Total Bilirubin 09/08/2017 0.66  0.20 - 1.20 mg/dL Final  . Alkaline Phosphatase 09/08/2017 72  40 - 150 U/L Final  . AST 09/08/2017 19  5 - 34 U/L Final  . ALT 09/08/2017 13  0 - 55 U/L Final  . Total Protein 09/08/2017 7.7  6.4 - 8.3 g/dL  Final  . Albumin 09/08/2017 3.9  3.5 - 5.0 g/dL Final  . Calcium 09/08/2017 10.1  8.4 - 10.4 mg/dL Final  . Anion Gap 09/08/2017 9  3 - 11 mEq/L Final  . EGFR 09/08/2017 >60  >60 ml/min/1.73 m2 Final   eGFR is calculated using the CKD-EPI Creatinine Equation (2009)  . WBC 09/08/2017 3.7* 4.0 - 10.3 10e3/uL Final  . NEUT# 09/08/2017 2.2  1.5 - 6.5 10e3/uL Final  . HGB 09/08/2017 12.3* 13.0 - 17.1 g/dL Final  . HCT 09/08/2017 37.3* 38.4 - 49.9 % Final  . Platelets 09/08/2017 146  140 - 400 10e3/uL Final  . MCV 09/08/2017 88.2  79.3 - 98.0 fL Final  . MCH 09/08/2017 29.1  27.2 - 33.4 pg Final  . MCHC 09/08/2017 33.0  32.0 - 36.0 g/dL Final  . RBC 09/08/2017 4.23  4.20 - 5.82 10e6/uL Final  . RDW 09/08/2017 13.5  11.0 - 14.6 % Final  . lymph# 09/08/2017 0.9  0.9 - 3.3 10e3/uL Final  . MONO# 09/08/2017 0.5  0.1 - 0.9 10e3/uL Final  . Eosinophils Absolute 09/08/2017 0.2  0.0 - 0.5 10e3/uL Final  . Basophils Absolute 09/08/2017 0.0  0.0 - 0.1 10e3/uL Final  . NEUT% 09/08/2017 59.1  39.0 - 75.0 % Final  . LYMPH% 09/08/2017 23.8  14.0 - 49.0 % Final  . MONO% 09/08/2017 12.3  0.0 - 14.0 % Final  . EOS% 09/08/2017 4.3  0.0 - 7.0 % Final  . BASO% 09/08/2017 0.5  0.0 - 2.0 % Final       Ardath Sax, MD

## 2017-09-25 NOTE — Assessment & Plan Note (Addendum)
56 y.o. with left tonsillar carcinoma.  Imaging demonstrates locally advanced disease without evidence of metastatic disease in the lymph nodes by PET/CT.  No evidence of distal metastasis. Patient underwent dental evaluation and surgery due to poor dentition.    Subsequently, patient was started on systemic chemotherapy with weekly carboplatin and paclitaxel administered concurrently with radiation.  He appears to have tolerated first week reasonably well, but is starting to develop some mucositis with odynophagia.  Clinical evaluation and lab work are permissive to proceed with the next week of systemic therapy.   Plan:  --Have reviewed use of available pain medications and antacids. --Proceed with the second week of systemic chemotherapy --Return to my clinic in 1 week for labs, toxicity monitoring, and continuation of systemic therapy

## 2017-09-27 ENCOUNTER — Ambulatory Visit: Payer: Medicaid Other

## 2017-09-27 NOTE — Progress Notes (Signed)
Oncology Nurse Navigator Documentation  To provide support, encouragement and care continuity, met with Mr. Hasler s/p RT. Coordinated meeting with [place]. He denied additional needs at this time.  Gayleen Orem, RN, BSN, [place] Oncology Nurse [place] [facility] [place] [phone]

## 2017-09-28 ENCOUNTER — Other Ambulatory Visit: Payer: Self-pay | Admitting: Hematology and Oncology

## 2017-09-28 ENCOUNTER — Ambulatory Visit: Payer: Medicaid Other

## 2017-09-28 ENCOUNTER — Ambulatory Visit
Admission: RE | Admit: 2017-09-28 | Discharge: 2017-09-28 | Disposition: A | Discharge status: Home / Self Care | Payer: Medicaid Other | Source: Ambulatory Visit | Attending: Radiation Oncology | Admitting: Radiation Oncology

## 2017-09-28 DIAGNOSIS — Z510 Encounter for antineoplastic radiation therapy: Secondary | ICD-10-CM | POA: Diagnosis not present

## 2017-09-28 DIAGNOSIS — C099 Malignant neoplasm of tonsil, unspecified: Secondary | ICD-10-CM

## 2017-09-28 NOTE — Progress Notes (Signed)
Nutrition Follow-up:   ASSESSMENT:  Patient receiving radiation and chemotherapy for tonsil cancer.    Met with patient following radiation therapy today.  Patient reports that he is drinking ensure plus, 2 per day but throat sometimes has burning sensation in throat area.  Patient reports that he has been taking 5 cartons of osmolite 1.5 via PEG tube.  Reports that he is flushing tube with 43m of water before and after feeding each can.  Reports that he is spacing cans out every 3 hours.  Reports that he received a delivery of osmolite 1.5 from [facility].  Reports that he ate few bites of fish, green peas and mashed potatoes yesterday, only able to tolerate few bites of soft foods.    No other nutrition impact symptoms reported today.   Medications: diflucan, decadron, zofran, compazine  Labs: reviewed  Anthropometrics:   Noted weight 124 lb 9.6 oz on 12/4 (chemotherapy visit).  Also noted weight of 120 lb on 12/4 (??dentist office) November weight of 128 lb 3.2 oz  Patient reports that he weighed at radiation and was 121 lb.   Estimated Energy Needs  Kcals: [id number]/d Protein: 75-90 g/d Fluid: 2.0 L/d  NUTRITION DIAGNOSIS: Malnutrition continues   MALNUTRITION DIAGNOSIS: Severe malnutrition continues   INTERVENTION:   Encouraged patient to continue using 5 cartons of osmolite 1.5 via PEG tube with flushing 618mof water before and after each carton.  Discussed if patient unable to drink ensure plus via mouth can add 1-2 more cartons of osmolite 1.5 via tube to give additional calories.  Patient reports that he is drinking 72080mf water daily and if unable to drink it he knows to deliver the water via PEG tube (smaller volume spaced out during the day).   Tube feeding regimen with osmolite 1.5 and ensure plus providesL 2477 calories/d, 101 g of protein and 2220 free water (formula and free water)    MONITORING, EVALUATION, GOAL: Patient will  tolerate tube feeding plus oral intake to promote weight gain   NEXT VISIT: Tuesday, December 18 after radiation, reminded patient of this appoinment  Jermell Holeman B. AllZenia ResidesD,[place]orefieldDNPasturagistered Dietitian [phone])

## 2017-09-29 ENCOUNTER — Ambulatory Visit (HOSPITAL_BASED_OUTPATIENT_CLINIC_OR_DEPARTMENT_OTHER): Payer: Medicaid Other | Admitting: Hematology and Oncology

## 2017-09-29 ENCOUNTER — Telehealth: Payer: Self-pay | Admitting: Hematology and Oncology

## 2017-09-29 ENCOUNTER — Ambulatory Visit (HOSPITAL_BASED_OUTPATIENT_CLINIC_OR_DEPARTMENT_OTHER): Payer: Medicaid Other

## 2017-09-29 ENCOUNTER — Other Ambulatory Visit (HOSPITAL_BASED_OUTPATIENT_CLINIC_OR_DEPARTMENT_OTHER): Payer: Medicaid Other

## 2017-09-29 ENCOUNTER — Ambulatory Visit
Admission: RE | Admit: 2017-09-29 | Discharge: 2017-09-29 | Disposition: A | Discharge status: Home / Self Care | Payer: Medicaid Other | Source: Ambulatory Visit | Attending: Radiation Oncology | Admitting: Radiation Oncology

## 2017-09-29 VITALS — HR 57

## 2017-09-29 VITALS — BP 103/77 | HR 57 | Temp 98.7°F | Resp 18 | Ht 67.0 in | Wt 118.4 lb

## 2017-09-29 DIAGNOSIS — C779 Secondary and unspecified malignant neoplasm of lymph node, unspecified: Secondary | ICD-10-CM

## 2017-09-29 DIAGNOSIS — Z5111 Encounter for antineoplastic chemotherapy: Secondary | ICD-10-CM

## 2017-09-29 DIAGNOSIS — C099 Malignant neoplasm of tonsil, unspecified: Secondary | ICD-10-CM | POA: Diagnosis present

## 2017-09-29 DIAGNOSIS — D61818 Other pancytopenia: Secondary | ICD-10-CM | POA: Diagnosis not present

## 2017-09-29 DIAGNOSIS — Z510 Encounter for antineoplastic radiation therapy: Secondary | ICD-10-CM | POA: Diagnosis not present

## 2017-09-29 DIAGNOSIS — R634 Abnormal weight loss: Secondary | ICD-10-CM | POA: Diagnosis not present

## 2017-09-29 LAB — CBC WITH DIFFERENTIAL/PLATELET
BASO%: 0.5 % (ref 0.0–2.0)
Basophils Absolute: 0 10*3/uL (ref 0.0–0.1)
EOS ABS: 0 10*3/uL (ref 0.0–0.5)
EOS%: 0.9 % (ref 0.0–7.0)
HCT: 33 % — ABNORMAL LOW (ref 38.4–49.9)
HEMOGLOBIN: 11 g/dL — AB (ref 13.0–17.1)
LYMPH%: 23.3 % (ref 14.0–49.0)
MCH: 28.9 pg (ref 27.2–33.4)
MCHC: 33.3 g/dL (ref 32.0–36.0)
MCV: 86.6 fL (ref 79.3–98.0)
MONO#: 0.4 10*3/uL (ref 0.1–0.9)
MONO%: 17.8 % — AB (ref 0.0–14.0)
NEUT%: 57.5 % (ref 39.0–75.0)
NEUTROS ABS: 1.3 10*3/uL — AB (ref 1.5–6.5)
Platelets: 101 10*3/uL — ABNORMAL LOW (ref 140–400)
RBC: 3.81 10*6/uL — ABNORMAL LOW (ref 4.20–5.82)
RDW: 13.4 % (ref 11.0–14.6)
WBC: 2.2 10*3/uL — AB (ref 4.0–10.3)
lymph#: 0.5 10*3/uL — ABNORMAL LOW (ref 0.9–3.3)

## 2017-09-29 LAB — COMPREHENSIVE METABOLIC PANEL
ALBUMIN: 3.5 g/dL (ref 3.5–5.0)
ALK PHOS: 69 U/L (ref 40–150)
ALT: 11 U/L (ref 0–55)
ANION GAP: 11 meq/L (ref 3–11)
AST: 15 U/L (ref 5–34)
BILIRUBIN TOTAL: 0.26 mg/dL (ref 0.20–1.20)
BUN: 18.1 mg/dL (ref 7.0–26.0)
CALCIUM: 9.7 mg/dL (ref 8.4–10.4)
CO2: 30 mEq/L — ABNORMAL HIGH (ref 22–29)
Chloride: 95 mEq/L — ABNORMAL LOW (ref 98–109)
Creatinine: 0.7 mg/dL (ref 0.7–1.3)
GLUCOSE: 101 mg/dL (ref 70–140)
POTASSIUM: 4.6 meq/L (ref 3.5–5.1)
SODIUM: 136 meq/L (ref 136–145)
TOTAL PROTEIN: 7.2 g/dL (ref 6.4–8.3)

## 2017-09-29 MED ORDER — PALONOSETRON HCL INJECTION 0.25 MG/5ML
INTRAVENOUS | Status: AC
  Filled 2017-09-29: qty 5

## 2017-09-29 MED ORDER — FAMOTIDINE IN NACL 20-0.9 MG/50ML-% IV SOLN
INTRAVENOUS | Status: AC
  Filled 2017-09-29: qty 50

## 2017-09-29 MED ORDER — PALONOSETRON HCL INJECTION 0.25 MG/5ML
0.2500 mg | Freq: Once | INTRAVENOUS | Status: AC
  Administered 2017-09-29: 0.25 mg via INTRAVENOUS

## 2017-09-29 MED ORDER — DIPHENHYDRAMINE HCL 50 MG/ML IJ SOLN
50.0000 mg | Freq: Once | INTRAMUSCULAR | Status: AC
  Administered 2017-09-29: 50 mg via INTRAVENOUS

## 2017-09-29 MED ORDER — SODIUM CHLORIDE 0.9 % IV SOLN
Freq: Once | INTRAVENOUS | Status: AC
  Administered 2017-09-29: 15:00:00 via INTRAVENOUS

## 2017-09-29 MED ORDER — CARBOPLATIN CHEMO INJECTION 450 MG/45ML
200.0000 mg | Freq: Once | INTRAVENOUS | Status: AC
  Administered 2017-09-29: 200 mg via INTRAVENOUS
  Filled 2017-09-29: qty 20

## 2017-09-29 MED ORDER — SODIUM CHLORIDE 0.9% FLUSH
10.0000 mL | INTRAVENOUS | Status: DC | PRN
  Administered 2017-09-29: 10 mL
  Filled 2017-09-29: qty 10

## 2017-09-29 MED ORDER — SODIUM CHLORIDE 0.9 % IV SOLN
20.0000 mg | Freq: Once | INTRAVENOUS | Status: AC
  Administered 2017-09-29: 20 mg via INTRAVENOUS
  Filled 2017-09-29: qty 2

## 2017-09-29 MED ORDER — SODIUM CHLORIDE 0.9 % IV SOLN
45.0000 mg/m2 | Freq: Once | INTRAVENOUS | Status: AC
  Administered 2017-09-29: 78 mg via INTRAVENOUS
  Filled 2017-09-29: qty 13

## 2017-09-29 MED ORDER — FAMOTIDINE IN NACL 20-0.9 MG/50ML-% IV SOLN
20.0000 mg | Freq: Once | INTRAVENOUS | Status: AC
  Administered 2017-09-29: 20 mg via INTRAVENOUS

## 2017-09-29 MED ORDER — HEPARIN SOD (PORK) LOCK FLUSH 100 UNIT/ML IV SOLN
500.0000 [IU] | Freq: Once | INTRAVENOUS | Status: AC | PRN
  Administered 2017-09-29: 500 [IU]
  Filled 2017-09-29: qty 5

## 2017-09-29 MED ORDER — DIPHENHYDRAMINE HCL 50 MG/ML IJ SOLN
INTRAMUSCULAR | Status: AC
  Filled 2017-09-29: qty 1

## 2017-09-29 NOTE — Patient Instructions (Signed)
[facility] Discharge Instructions for Patients Receiving Chemotherapy  Today you received the following chemotherapy agents Taxol and carboplatin  To help prevent nausea and vomiting after your treatment, we encourage you to take your nausea medication as directed   If you develop nausea and vomiting that is not controlled by your nausea medication, call the clinic.   BELOW ARE SYMPTOMS THAT SHOULD BE REPORTED IMMEDIATELY:  *FEVER GREATER THAN 100.5 F  *CHILLS WITH OR WITHOUT FEVER  NAUSEA AND VOMITING THAT IS NOT CONTROLLED WITH YOUR NAUSEA MEDICATION  *UNUSUAL SHORTNESS OF BREATH  *UNUSUAL BRUISING OR BLEEDING  TENDERNESS IN MOUTH AND THROAT WITH OR WITHOUT PRESENCE OF ULCERS  *URINARY PROBLEMS  *BOWEL PROBLEMS  UNUSUAL RASH Items with * indicate a potential emergency and should be followed up as soon as possible.  Feel free to call the clinic should you have any questions or concerns. The clinic phone number is [phone].  Please show the CHEMO ALERT CARD at check-in to the Emergency Department and triage nurse.   

## 2017-09-29 NOTE — Telephone Encounter (Signed)
Gave avs and calendar for December and January 2019 °

## 2017-09-29 NOTE — Progress Notes (Signed)
Okay to treat with ANC of 1.3,Plt 101 and HR of 57 per Tammi RN per Dr. Alvy Bimler.

## 2017-09-30 ENCOUNTER — Ambulatory Visit
Admission: RE | Admit: 2017-09-30 | Discharge: 2017-09-30 | Disposition: A | Discharge status: Home / Self Care | Payer: Medicaid Other | Source: Ambulatory Visit | Attending: Radiation Oncology | Admitting: Radiation Oncology

## 2017-09-30 DIAGNOSIS — Z510 Encounter for antineoplastic radiation therapy: Secondary | ICD-10-CM | POA: Diagnosis not present

## 2017-10-01 ENCOUNTER — Ambulatory Visit
Admission: RE | Admit: 2017-10-01 | Discharge: 2017-10-01 | Disposition: A | Discharge status: Home / Self Care | Payer: Medicaid Other | Source: Ambulatory Visit | Attending: Radiation Oncology | Admitting: Radiation Oncology

## 2017-10-01 ENCOUNTER — Encounter: Payer: Self-pay | Admitting: Hematology and Oncology

## 2017-10-01 DIAGNOSIS — Z510 Encounter for antineoplastic radiation therapy: Secondary | ICD-10-CM | POA: Diagnosis not present

## 2017-10-01 DIAGNOSIS — D61818 Other pancytopenia: Secondary | ICD-10-CM | POA: Insufficient documentation

## 2017-10-01 DIAGNOSIS — R634 Abnormal weight loss: Secondary | ICD-10-CM | POA: Insufficient documentation

## 2017-10-01 NOTE — Progress Notes (Signed)
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 There are foci suspicious but not definitive for invasion. p16 immunohistochemistry is negative in the neoplastic cells. FINAL DIAGNOSIS       07/27/2017 Imaging    CT neck: Indistinct tonsillar enlargement on the left in the region of left tonsillar mass biopsy. This could be a combination of post biopsy edema and residual mass. Margins are not discrete and accurate measurement cannot be performed. This is estimated at about 3 cm in size. No evidence of metastatic adenopathy.      08/16/2017 Imaging    PET-CT: Hypermetabolic disease in the left palatal fossa.  Indeterminate level 3 lymph node which may be reactive considering recent dental extractions.      09/01/2017 -  Radiation Therapy         09/01/2017 -  Chemotherapy    Carbboplatin AUC2,d1 + Paclitaxel '45mg'$ /m2, d1 Q7d --Week #1, 29/92/42: Complicated by some burning/soreness in the throat. --Week #2, 09/08/17:      09/13/2017 Procedure    Successful fluoroscopic insertion of a 20-French pull-through gastrostomy tube. Successful placement of a right internal jugular approach power injectable  Port-A-Cath.       INTERVAL HISTORY: Please see below for problem oriented charting. He returns for further follow-up He denies recent nausea or vomiting Denies peripheral neuropathy from chemotherapy No recent infection He denies recent smoking or drinking. He is using feeding tube for nutritional supplement  REVIEW OF SYSTEMS:   Constitutional: Denies fevers, chills  Eyes: Denies blurriness of vision Ears, nose, mouth, throat, and face: Denies mucositis or sore throat Respiratory: Denies cough, dyspnea or wheezes Cardiovascular: Denies palpitation, chest discomfort or lower extremity swelling Gastrointestinal:  Denies nausea, heartburn or change in bowel habits Skin: Denies abnormal skin rashes Lymphatics: Denies new lymphadenopathy or easy bruising Neurological:Denies numbness, tingling or new weaknesses Behavioral/Psych: Mood is stable, no new changes  All other systems were reviewed with the patient and are negative.  I have reviewed the past medical history, past surgical history, social history and family history with the patient and they are unchanged from previous note.  ALLERGIES:  has No Known Allergies.  MEDICATIONS:  Current Outpatient Medications  Medication Sig Dispense Refill  . acetaminophen (TYLENOL) 325 MG tablet Take 650 mg by mouth every 6 (six) hours as needed for moderate pain.     Marland Kitchen dexamethasone (DECADRON) 4 MG tablet Take 2 tablets (8 mg total) daily by mouth. Start the day after chemotherapy for 2 days. 30 tablet 1  . fluconazole (DIFLUCAN) 200 MG tablet Take 1 tablet (200 mg total) by mouth daily for 14 days. 14 tablet 0  . lidocaine (XYLOCAINE) 2 % solution Mix 1 part 2%viscous lidocaine,1part H2O.Swish and/or swallow 37m of this mixture,337m before meals  and at bedtime, up to QID 100 mL 5  . lidocaine-prilocaine (EMLA) cream Apply to affected area once 30 g 3  . LORazepam (ATIVAN) 0.5 MG tablet Take 1 tablet (0.5 mg total) every 6 (six) hours as  needed by mouth (Nausea or vomiting). 30 tablet 0  . Morphine Sulfate (MORPHINE CONCENTRATE) 10 mg / 0.5 ml concentrated solution Place 0.5 mLs (10 mg total) into feeding tube every 4 (four) hours as needed for severe pain. 1 Bottle 0  . nicotine polacrilex (NICOTINE MINI) 4 MG lozenge Take 1 lozenge (4 mg total) by mouth as needed for smoking cessation. 100 tablet 0  . Nutritional Supplements (FEEDING SUPPLEMENT, OSMOLITE 1.5 CAL,) LIQD Give 1 bottle Osmolite 1.5 via PEG 5 times daily with 60 mL free water before and after bolus. Drink or flush tube with additional 720 mL free water 3 times daily. 5 Bottle 0  . ondansetron (ZOFRAN) 8 MG tablet Take 1 tablet (8 mg total) 2 (two) times daily as needed by mouth for refractory nausea / vomiting. Start on day 3 after chemo. 30 tablet 1  . prochlorperazine (COMPAZINE) 10 MG tablet Take 1 tablet (10 mg total) every 6 (six) hours as needed by mouth (Nausea or vomiting). 30 tablet 1   No current facility-administered medications for this visit.     PHYSICAL EXAMINATION: ECOG PERFORMANCE STATUS: 1 - Symptomatic but completely ambulatory  Vitals:   09/29/17 1304  BP: 103/77  Pulse: (!) 57  Resp: 18  Temp: 98.7 F (37.1 C)  SpO2: 100%   Filed Weights   09/29/17 1304  Weight: 118 lb 6.4 oz (53.7 kg)    GENERAL:alert, no distress and comfortable.  He appears thin and cachectic SKIN: skin color, texture, turgor are normal, no rashes or significant lesions EYES: normal, Conjunctiva are pink and non-injected, sclera clear OROPHARYNX:no exudate, no erythema and lips, buccal mucosa, and tongue normal  NECK: supple, thyroid normal size, non-tender, without nodularity LYMPH: He has palpable lymphadenopathy on the left side of his neck LUNGS: clear to auscultation and percussion with normal breathing effort HEART: regular rate & rhythm and no murmurs and no lower extremity edema ABDOMEN:abdomen soft, non-tender and normal bowel  sounds Musculoskeletal:no cyanosis of digits and no clubbing  NEURO: alert & oriented x 3 with fluent speech, no focal motor/sensory deficits  LABORATORY DATA:  I have reviewed the data as listed    Component Value Date/Time   NA 136 09/29/2017 1253   K 4.6 09/29/2017 1253   CL 97 (L) 09/13/2017 1140   CO2 30 (H) 09/29/2017 1253   GLUCOSE 101 09/29/2017 1253   BUN 18.1 09/29/2017 1253   CREATININE 0.7 09/29/2017 1253   CALCIUM 9.7 09/29/2017 1253   PROT 7.2 09/29/2017 1253   ALBUMIN 3.5 09/29/2017 1253   AST 15 09/29/2017 1253   ALT 11 09/29/2017 1253   ALKPHOS 69 09/29/2017 1253   BILITOT 0.26 09/29/2017 1253   GFRNONAA >60 09/13/2017 1140   GFRAA >60 09/13/2017 1140    No results found for: SPEP, UPEP  Lab Results  Component Value Date   WBC 2.2 (L) 09/29/2017   NEUTROABS 1.3 (L) 09/29/2017   HGB 11.0 (L) 09/29/2017   HCT 33.0 (L) 09/29/2017   MCV 86.6 09/29/2017   PLT 101 (L) 09/29/2017      Chemistry      Component Value Date/Time   NA 136 09/29/2017 1253   K 4.6 09/29/2017 1253   CL 97 (L) 09/13/2017 1140  CO2 30 (H) 09/29/2017 1253   BUN 18.1 09/29/2017 1253   CREATININE 0.7 09/29/2017 1253      Component Value Date/Time   CALCIUM 9.7 09/29/2017 1253   ALKPHOS 69 09/29/2017 1253   AST 15 09/29/2017 1253   ALT 11 09/29/2017 1253   BILITOT 0.26 09/29/2017 1253       RADIOGRAPHIC STUDIES: I have personally reviewed the radiological images as listed and agreed with the findings in the report. Ir Gastrostomy Tube  Result Date: 09/13/2017 INDICATION: History of head and neck cancer. In need of gastrostomy tube placement for enteric nutrition supplementation while undergoing chemoradiation. EXAM: PULL TROUGH GASTROSTOMY TUBE PLACEMENT COMPARISON:  PET-CT - 08/16/2017 MEDICATIONS: Ancef 2 gm IV; Antibiotics were administered within 1 hour of the procedure. Glucagon 1 mg IV CONTRAST:  20 mL of Isovue 300 administered into the gastric lumen.  ANESTHESIA/SEDATION: Moderate (conscious) sedation was employed during this procedure. A total of Versed 1 mg and Fentanyl 100 mcg was administered intravenously. Moderate Sedation Time: 18 minutes. The patient's level of consciousness and vital signs were monitored continuously by radiology nursing throughout the procedure under my direct supervision. FLUOROSCOPY TIME:  2 minutes 6 seconds (7 mGy) COMPLICATIONS: None immediate. PROCEDURE: Informed written consent was obtained from the patient following explanation of the procedure, risks, benefits and alternatives. A time out was performed prior to the initiation of the procedure. Ultrasound scanning was performed to demarcate the edge of the left lobe of the liver. Maximal barrier sterile technique utilized including caps, mask, sterile gowns, sterile gloves, large sterile drape, hand hygiene and Betadine prep. The left upper quadrant was sterilely prepped and draped. An oral gastric catheter was inserted into the stomach under fluoroscopy. The existing nasogastric feeding tube was removed. The left costal margin and air/barium opacified transverse colon were identified and avoided. Air was injected into the stomach for insufflation and visualization under fluoroscopy. Under sterile conditions a 17 gauge trocar needle was utilized to access the stomach percutaneously beneath the left subcostal margin after the overlying soft tissues were anesthetized with 1% Lidocaine with epinephrine. Needle position was confirmed within the stomach with aspiration of air and injection of small amount of contrast. A single T tack was deployed for gastropexy. Over an Amplatz guide wire, a 9-French sheath was inserted into the stomach. A snare device was utilized to capture the oral gastric catheter. The snare device was pulled retrograde from the stomach up the esophagus and out the oropharynx. The 20-French pull-through gastrostomy was connected to the snare device and pulled  antegrade through the oropharynx down the esophagus into the stomach and then through the percutaneous tract external to the patient. The gastrostomy was assembled externally. Contrast injection confirms position in the stomach. Several spot radiographic images were obtained in various obliquities for documentation. The patient tolerated procedure well without immediate post procedural complication. FINDINGS: After successful fluoroscopic guided placement, the gastrostomy tube is appropriately positioned with internal disc against the ventral aspect of the gastric lumen. IMPRESSION: Successful fluoroscopic insertion of a 20-French pull-through gastrostomy tube. The gastrostomy may be used immediately for medication administration and in 24 hrs for the initiation of feeds. Electronically Signed   By: Sandi Mariscal M.D.   On: 09/13/2017 15:05   Ir US Guide Vasc Access Right  Result Date: 09/13/2017 INDICATION: History of head neck cancer. In need of durable intravenous access for chemotherapy administration. EXAM: IMPLANTED PORT A CATH PLACEMENT WITH ULTRASOUND AND FLUOROSCOPIC GUIDANCE COMPARISON:  PET-CT - 08/16/2017 MEDICATIONS: Ancef  2 gm IV; The antibiotic was administered within an appropriate time interval prior to skin puncture. ANESTHESIA/SEDATION: Moderate (conscious) sedation was employed during this procedure. A total of Versed 3 mg and Fentanyl 100 mcg was administered intravenously. Moderate Sedation Time: 26 minutes. The patient's level of consciousness and vital signs were monitored continuously by radiology nursing throughout the procedure under my direct supervision. CONTRAST:  None FLUOROSCOPY TIME:  24 seconds (2 mGy) COMPLICATIONS: None immediate. PROCEDURE: The procedure, risks, benefits, and alternatives were explained to the patient. Questions regarding the procedure were encouraged and answered. The patient understands and consents to the procedure. The right neck and chest were prepped  with chlorhexidine in a sterile fashion, and a sterile drape was applied covering the operative field. Maximum barrier sterile technique with sterile gowns and gloves were used for the procedure. A timeout was performed prior to the initiation of the procedure. Local anesthesia was provided with 1% lidocaine with epinephrine. After creating a small venotomy incision, a micropuncture kit was utilized to access the internal jugular vein. Real-time ultrasound guidance was utilized for vascular access including the acquisition of a permanent ultrasound image documenting patency of the accessed vessel. The microwire was utilized to measure appropriate catheter length. A subcutaneous port pocket was then created along the upper chest wall utilizing a combination of sharp and blunt dissection. The pocket was irrigated with sterile saline. A single lumen thin power injectable port was chosen for placement. The 8 Fr catheter was tunneled from the port pocket site to the venotomy incision. The port was placed in the pocket. The external catheter was trimmed to appropriate length. At the venotomy, an 8 Fr peel-away sheath was placed over a guidewire under fluoroscopic guidance. The catheter was then placed through the sheath and the sheath was removed. Final catheter positioning was confirmed and documented with a fluoroscopic spot radiograph. The port was accessed with a Huber needle, aspirated and flushed with heparinized saline. The venotomy site was closed with an interrupted 4-0 Vicryl suture. The port pocket incision was closed with interrupted 2-0 Vicryl suture and the skin was opposed with a running subcuticular 4-0 Vicryl suture. Dermabond and Steri-strips were applied to both incisions. Dressings were placed. The patient tolerated the procedure well without immediate post procedural complication. FINDINGS: After catheter placement, the tip lies within the superior cavoatrial junction. The catheter aspirates and  flushes normally and is ready for immediate use. IMPRESSION: Successful placement of a right internal jugular approach power injectable Port-A-Cath. The catheter is ready for immediate use. Electronically Signed   By: Sandi Mariscal M.D.   On: 09/13/2017 15:00   Ir Fluoro Guide Port Insertion Right  Result Date: 09/13/2017 INDICATION: History of head neck cancer. In need of durable intravenous access for chemotherapy administration. EXAM: IMPLANTED PORT A CATH PLACEMENT WITH ULTRASOUND AND FLUOROSCOPIC GUIDANCE COMPARISON:  PET-CT - 08/16/2017 MEDICATIONS: Ancef 2 gm IV; The antibiotic was administered within an appropriate time interval prior to skin puncture. ANESTHESIA/SEDATION: Moderate (conscious) sedation was employed during this procedure. A total of Versed 3 mg and Fentanyl 100 mcg was administered intravenously. Moderate Sedation Time: 26 minutes. The patient's level of consciousness and vital signs were monitored continuously by radiology nursing throughout the procedure under my direct supervision. CONTRAST:  None FLUOROSCOPY TIME:  24 seconds (2 mGy) COMPLICATIONS: None immediate. PROCEDURE: The procedure, risks, benefits, and alternatives were explained to the patient. Questions regarding the procedure were encouraged and answered. The patient understands and consents to the procedure. The right  neck and chest were prepped with chlorhexidine in a sterile fashion, and a sterile drape was applied covering the operative field. Maximum barrier sterile technique with sterile gowns and gloves were used for the procedure. A timeout was performed prior to the initiation of the procedure. Local anesthesia was provided with 1% lidocaine with epinephrine. After creating a small venotomy incision, a micropuncture kit was utilized to access the internal jugular vein. Real-time ultrasound guidance was utilized for vascular access including the acquisition of a permanent ultrasound image documenting patency of the  accessed vessel. The microwire was utilized to measure appropriate catheter length. A subcutaneous port pocket was then created along the upper chest wall utilizing a combination of sharp and blunt dissection. The pocket was irrigated with sterile saline. A single lumen thin power injectable port was chosen for placement. The 8 Fr catheter was tunneled from the port pocket site to the venotomy incision. The port was placed in the pocket. The external catheter was trimmed to appropriate length. At the venotomy, an 8 Fr peel-away sheath was placed over a guidewire under fluoroscopic guidance. The catheter was then placed through the sheath and the sheath was removed. Final catheter positioning was confirmed and documented with a fluoroscopic spot radiograph. The port was accessed with a Huber needle, aspirated and flushed with heparinized saline. The venotomy site was closed with an interrupted 4-0 Vicryl suture. The port pocket incision was closed with interrupted 2-0 Vicryl suture and the skin was opposed with a running subcuticular 4-0 Vicryl suture. Dermabond and Steri-strips were applied to both incisions. Dressings were placed. The patient tolerated the procedure well without immediate post procedural complication. FINDINGS: After catheter placement, the tip lies within the superior cavoatrial junction. The catheter aspirates and flushes normally and is ready for immediate use. IMPRESSION: Successful placement of a right internal jugular approach power injectable Port-A-Cath. The catheter is ready for immediate use. Electronically Signed   By: Sandi Mariscal M.D.   On: 09/13/2017 15:00    ASSESSMENT & PLAN:  Squamous cell carcinoma of left tonsil ([facility]) So far, he tolerated chemotherapy well without side effects We will proceed with treatment without dose adjustment He will return to see his medical oncologist in 2 weeks for further follow-up  Pancytopenia, acquired ([place]) He has progressive pancytopenia  secondary to recent weight loss and side effects of chemotherapy I will proceed with treatment with minor dose adjustment   Weight loss He had mild progressive weight loss and appears cachectic I encouraged him to use his feeding tube as tolerated He has appointment to follow closely with dietitian during treatment   Orders Placed This Encounter  Procedures  . CBC with Differential/Platelet    Standing Status:   Standing    Number of Occurrences:   2    Standing Expiration Date:   09/29/2018  . Comprehensive metabolic panel    Standing Status:   Standing    Number of Occurrences:   2    Standing Expiration Date:   09/29/2018   All questions were answered. The patient knows to call the clinic with any problems, questions or concerns. No barriers to learning was detected. I spent 15 minutes counseling the patient face to face. The total time spent in the appointment was 20 minutes and more than 50% was on counseling and review of test results     Heath Lark, MD 10/01/2017 9:15 AM

## 2017-10-01 NOTE — Assessment & Plan Note (Signed)
He has progressive pancytopenia secondary to recent weight loss and side effects of chemotherapy I will proceed with treatment with minor dose adjustment

## 2017-10-01 NOTE — Assessment & Plan Note (Signed)
So far, he tolerated chemotherapy well without side effects We will proceed with treatment without dose adjustment He will return to see his medical oncologist in 2 weeks for further follow-up

## 2017-10-01 NOTE — Assessment & Plan Note (Signed)
He had mild progressive weight loss and appears cachectic I encouraged him to use his feeding tube as tolerated He has appointment to follow closely with dietitian during treatment

## 2017-10-04 ENCOUNTER — Ambulatory Visit
Admission: RE | Admit: 2017-10-04 | Discharge: 2017-10-04 | Disposition: A | Discharge status: Home / Self Care | Payer: Medicaid Other | Source: Ambulatory Visit | Attending: Radiation Oncology | Admitting: Radiation Oncology

## 2017-10-04 ENCOUNTER — Ambulatory Visit: Payer: Medicaid Other | Attending: Radiation Oncology

## 2017-10-04 DIAGNOSIS — C099 Malignant neoplasm of tonsil, unspecified: Secondary | ICD-10-CM

## 2017-10-04 DIAGNOSIS — Z510 Encounter for antineoplastic radiation therapy: Secondary | ICD-10-CM | POA: Diagnosis not present

## 2017-10-04 MED ORDER — SONAFINE EX EMUL
1.0000 "application " | Freq: Once | CUTANEOUS | Status: AC
  Administered 2017-10-04: 1 via TOPICAL

## 2017-10-05 ENCOUNTER — Ambulatory Visit
Admission: RE | Admit: 2017-10-05 | Discharge: 2017-10-05 | Disposition: A | Discharge status: Home / Self Care | Payer: Medicaid Other | Source: Ambulatory Visit | Attending: Radiation Oncology | Admitting: Radiation Oncology

## 2017-10-05 DIAGNOSIS — Z510 Encounter for antineoplastic radiation therapy: Secondary | ICD-10-CM | POA: Diagnosis not present

## 2017-10-05 NOTE — Progress Notes (Signed)
Nutrition  Patient did not show up for nutrition follow-up visit today following radiation appointment.   Jema Deegan B. Zenia Resides, [place], [place] Registered Dietitian [phone] (pager)

## 2017-10-06 ENCOUNTER — Ambulatory Visit (HOSPITAL_BASED_OUTPATIENT_CLINIC_OR_DEPARTMENT_OTHER): Payer: Medicaid Other

## 2017-10-06 ENCOUNTER — Other Ambulatory Visit: Payer: Self-pay

## 2017-10-06 ENCOUNTER — Other Ambulatory Visit (HOSPITAL_BASED_OUTPATIENT_CLINIC_OR_DEPARTMENT_OTHER): Payer: Medicaid Other

## 2017-10-06 ENCOUNTER — Inpatient Hospital Stay (HOSPITAL_COMMUNITY): Payer: Medicaid Other

## 2017-10-06 ENCOUNTER — Ambulatory Visit (HOSPITAL_BASED_OUTPATIENT_CLINIC_OR_DEPARTMENT_OTHER): Payer: Medicaid Other | Admitting: Medical

## 2017-10-06 ENCOUNTER — Other Ambulatory Visit: Payer: Self-pay | Admitting: Hematology and Oncology

## 2017-10-06 ENCOUNTER — Encounter (HOSPITAL_COMMUNITY): Payer: Self-pay

## 2017-10-06 ENCOUNTER — Inpatient Hospital Stay (HOSPITAL_COMMUNITY)
Admission: AD | Admit: 2017-10-06 | Discharge: 2017-10-09 | DRG: 809 | Disposition: A | Discharge status: Home / Self Care | Payer: Medicaid Other | Source: Ambulatory Visit | Attending: Internal Medicine | Admitting: Internal Medicine

## 2017-10-06 ENCOUNTER — Ambulatory Visit
Admission: RE | Admit: 2017-10-06 | Discharge: 2017-10-06 | Disposition: A | Discharge status: Home / Self Care | Payer: Medicaid Other | Source: Ambulatory Visit | Attending: Radiation Oncology | Admitting: Radiation Oncology

## 2017-10-06 VITALS — BP 104/57 | HR 71 | Temp 101.3°F | Resp 18

## 2017-10-06 DIAGNOSIS — C099 Malignant neoplasm of tonsil, unspecified: Secondary | ICD-10-CM | POA: Diagnosis present

## 2017-10-06 DIAGNOSIS — Z510 Encounter for antineoplastic radiation therapy: Secondary | ICD-10-CM | POA: Diagnosis present

## 2017-10-06 DIAGNOSIS — R509 Fever, unspecified: Secondary | ICD-10-CM

## 2017-10-06 DIAGNOSIS — E43 Unspecified severe protein-calorie malnutrition: Secondary | ICD-10-CM

## 2017-10-06 DIAGNOSIS — R5081 Fever presenting with conditions classified elsewhere: Secondary | ICD-10-CM

## 2017-10-06 DIAGNOSIS — D709 Neutropenia, unspecified: Secondary | ICD-10-CM

## 2017-10-06 DIAGNOSIS — Z923 Personal history of irradiation: Secondary | ICD-10-CM

## 2017-10-06 DIAGNOSIS — E860 Dehydration: Secondary | ICD-10-CM

## 2017-10-06 DIAGNOSIS — E871 Hypo-osmolality and hyponatremia: Secondary | ICD-10-CM | POA: Diagnosis present

## 2017-10-06 DIAGNOSIS — D61818 Other pancytopenia: Secondary | ICD-10-CM | POA: Diagnosis present

## 2017-10-06 DIAGNOSIS — E440 Moderate protein-calorie malnutrition: Secondary | ICD-10-CM | POA: Diagnosis present

## 2017-10-06 DIAGNOSIS — R1310 Dysphagia, unspecified: Secondary | ICD-10-CM | POA: Diagnosis present

## 2017-10-06 DIAGNOSIS — D6481 Anemia due to antineoplastic chemotherapy: Secondary | ICD-10-CM | POA: Diagnosis present

## 2017-10-06 DIAGNOSIS — Z931 Gastrostomy status: Secondary | ICD-10-CM

## 2017-10-06 DIAGNOSIS — Z681 Body mass index (BMI) 19 or less, adult: Secondary | ICD-10-CM

## 2017-10-06 DIAGNOSIS — T451X5A Adverse effect of antineoplastic and immunosuppressive drugs, initial encounter: Secondary | ICD-10-CM | POA: Diagnosis present

## 2017-10-06 DIAGNOSIS — C779 Secondary and unspecified malignant neoplasm of lymph node, unspecified: Secondary | ICD-10-CM

## 2017-10-06 DIAGNOSIS — F1010 Alcohol abuse, uncomplicated: Secondary | ICD-10-CM | POA: Diagnosis not present

## 2017-10-06 DIAGNOSIS — D696 Thrombocytopenia, unspecified: Secondary | ICD-10-CM | POA: Diagnosis present

## 2017-10-06 DIAGNOSIS — F17210 Nicotine dependence, cigarettes, uncomplicated: Secondary | ICD-10-CM | POA: Diagnosis not present

## 2017-10-06 DIAGNOSIS — Z9221 Personal history of antineoplastic chemotherapy: Secondary | ICD-10-CM

## 2017-10-06 DIAGNOSIS — Z79899 Other long term (current) drug therapy: Secondary | ICD-10-CM | POA: Diagnosis not present

## 2017-10-06 DIAGNOSIS — D62 Acute posthemorrhagic anemia: Secondary | ICD-10-CM | POA: Diagnosis present

## 2017-10-06 DIAGNOSIS — D701 Agranulocytosis secondary to cancer chemotherapy: Secondary | ICD-10-CM

## 2017-10-06 DIAGNOSIS — C090 Malignant neoplasm of tonsillar fossa: Secondary | ICD-10-CM | POA: Diagnosis present

## 2017-10-06 DIAGNOSIS — D649 Anemia, unspecified: Secondary | ICD-10-CM

## 2017-10-06 DIAGNOSIS — K921 Melena: Secondary | ICD-10-CM | POA: Diagnosis present

## 2017-10-06 DIAGNOSIS — R05 Cough: Secondary | ICD-10-CM | POA: Diagnosis not present

## 2017-10-06 DIAGNOSIS — D638 Anemia in other chronic diseases classified elsewhere: Secondary | ICD-10-CM | POA: Diagnosis not present

## 2017-10-06 LAB — RAPID STREP SCREEN (MED CTR MEBANE ONLY): STREPTOCOCCUS, GROUP A SCREEN (DIRECT): NEGATIVE

## 2017-10-06 LAB — CBC WITH DIFFERENTIAL/PLATELET
BASO%: 0.3 % (ref 0.0–2.0)
Basophils Absolute: 0 10*3/uL (ref 0.0–0.1)
EOS ABS: 0.1 10*3/uL (ref 0.0–0.5)
EOS%: 3.4 % (ref 0.0–7.0)
HEMATOCRIT: 33.1 % — AB (ref 38.4–49.9)
HGB: 10.9 g/dL — ABNORMAL LOW (ref 13.0–17.1)
LYMPH#: 0.3 10*3/uL — AB (ref 0.9–3.3)
LYMPH%: 14.8 % (ref 14.0–49.0)
MCH: 28.6 pg (ref 27.2–33.4)
MCHC: 32.8 g/dL (ref 32.0–36.0)
MCV: 87.3 fL (ref 79.3–98.0)
MONO#: 0.4 10*3/uL (ref 0.1–0.9)
MONO%: 21.4 % — ABNORMAL HIGH (ref 0.0–14.0)
NEUT%: 60.1 % (ref 39.0–75.0)
NEUTROS ABS: 1.1 10*3/uL — AB (ref 1.5–6.5)
PLATELETS: 126 10*3/uL — AB (ref 140–400)
RBC: 3.8 10*6/uL — ABNORMAL LOW (ref 4.20–5.82)
RDW: 13.5 % (ref 11.0–14.6)
WBC: 1.7 10*3/uL — AB (ref 4.0–10.3)

## 2017-10-06 LAB — COMPREHENSIVE METABOLIC PANEL
ALBUMIN: 3.1 g/dL — AB (ref 3.5–5.0)
ALK PHOS: 82 U/L (ref 40–150)
ALT: 13 U/L (ref 0–55)
AST: 16 U/L (ref 5–34)
Anion Gap: 10 mEq/L (ref 3–11)
BUN: 11.7 mg/dL (ref 7.0–26.0)
CHLORIDE: 95 meq/L — AB (ref 98–109)
CO2: 29 mEq/L (ref 22–29)
Calcium: 9.6 mg/dL (ref 8.4–10.4)
Creatinine: 0.7 mg/dL (ref 0.7–1.3)
GLUCOSE: 145 mg/dL — AB (ref 70–140)
POTASSIUM: 3.8 meq/L (ref 3.5–5.1)
SODIUM: 134 meq/L — AB (ref 136–145)
Total Bilirubin: 0.66 mg/dL (ref 0.20–1.20)
Total Protein: 7.2 g/dL (ref 6.4–8.3)

## 2017-10-06 LAB — URINALYSIS, MICROSCOPIC - CHCC
BLOOD: NEGATIVE
Bilirubin (Urine): NEGATIVE
GLUCOSE UR CHCC: NEGATIVE mg/dL
Ketones: NEGATIVE mg/dL
Leukocyte Esterase: NEGATIVE
NITRITE: NEGATIVE
PH: 6 (ref 4.6–8.0)
Protein: NEGATIVE mg/dL
Specific Gravity, Urine: 1.02 (ref 1.003–1.035)
UROBILINOGEN UR: 0.2 mg/dL (ref 0.2–1)

## 2017-10-06 MED ORDER — MORPHINE SULFATE (CONCENTRATE) 10 MG/0.5ML PO SOLN
10.0000 mg | ORAL | Status: DC | PRN
  Administered 2017-10-06 – 2017-10-07 (×3): 10 mg
  Filled 2017-10-06 (×3): qty 0.5

## 2017-10-06 MED ORDER — DEXTROSE 5 % IV SOLN
2.0000 g | Freq: Two times a day (BID) | INTRAVENOUS | Status: DC
  Administered 2017-10-06: 2 g via INTRAVENOUS
  Filled 2017-10-06: qty 2

## 2017-10-06 MED ORDER — ONDANSETRON HCL 4 MG PO TABS: 4.0000 mg | ORAL_TABLET | Freq: Four times a day (QID) | ORAL | Status: DC | PRN

## 2017-10-06 MED ORDER — FLUCONAZOLE 200 MG PO TABS
200.0000 mg | ORAL_TABLET | Freq: Every day | ORAL | Status: DC
  Administered 2017-10-07 – 2017-10-09 (×3): 200 mg via ORAL
  Filled 2017-10-06 (×4): qty 1

## 2017-10-06 MED ORDER — MORPHINE SULFATE (CONCENTRATE) 10 MG /0.5 ML PO SOLN: 10.0000 mg | ORAL | Status: DC | PRN

## 2017-10-06 MED ORDER — OSMOLITE 1.5 CAL PO LIQD
237.0000 mL | Freq: Every day | ORAL | Status: DC
  Administered 2017-10-06 – 2017-10-07 (×3): 237 mL
  Filled 2017-10-06 (×4): qty 237

## 2017-10-06 MED ORDER — VANCOMYCIN HCL IN DEXTROSE 1-5 GM/200ML-% IV SOLN
1000.0000 mg | Freq: Once | INTRAVENOUS | Status: AC
  Administered 2017-10-06: 1000 mg via INTRAVENOUS

## 2017-10-06 MED ORDER — SODIUM CHLORIDE 0.9 % IV SOLN
Freq: Once | INTRAVENOUS | Status: AC
Start: 2017-10-06 — End: 2017-10-06
  Administered 2017-10-06: 14:00:00 via INTRAVENOUS

## 2017-10-06 MED ORDER — TBO-FILGRASTIM 300 MCG/0.5ML ~~LOC~~ SOSY
PREFILLED_SYRINGE | SUBCUTANEOUS | Status: AC
  Filled 2017-10-06: qty 0.5

## 2017-10-06 MED ORDER — HEPARIN SODIUM (PORCINE) 5000 UNIT/ML IJ SOLN: 5000.0000 [IU] | Freq: Three times a day (TID) | INTRAMUSCULAR | Status: DC

## 2017-10-06 MED ORDER — SODIUM CHLORIDE 0.9 % IV SOLN
INTRAVENOUS | Status: DC
Start: 2017-10-06 — End: 2017-10-09
  Administered 2017-10-06 – 2017-10-09 (×4): via INTRAVENOUS

## 2017-10-06 MED ORDER — ACETAMINOPHEN 325 MG PO TABS
650.0000 mg | ORAL_TABLET | Freq: Once | ORAL | Status: AC
  Administered 2017-10-06: 650 mg via ORAL

## 2017-10-06 MED ORDER — DEXTROSE 5 % IV SOLN
2.0000 g | Freq: Three times a day (TID) | INTRAVENOUS | Status: DC
  Filled 2017-10-06: qty 2

## 2017-10-06 MED ORDER — ACETAMINOPHEN 325 MG PO TABS
ORAL_TABLET | ORAL | Status: AC
  Filled 2017-10-06: qty 2

## 2017-10-06 MED ORDER — MORPHINE SULFATE 4 MG/ML IJ SOLN
2.0000 mg | Freq: Once | INTRAMUSCULAR | Status: AC
  Administered 2017-10-06: 2 mg via INTRAVENOUS
  Filled 2017-10-06: qty 1

## 2017-10-06 MED ORDER — PANTOPRAZOLE SODIUM 40 MG IV SOLR
40.0000 mg | INTRAVENOUS | Status: DC
  Administered 2017-10-06 – 2017-10-08 (×3): 40 mg via INTRAVENOUS
  Filled 2017-10-06 (×3): qty 40

## 2017-10-06 MED ORDER — SODIUM CHLORIDE 0.9% FLUSH
10.0000 mL | INTRAVENOUS | Status: DC | PRN
  Administered 2017-10-09: 10 mL
  Administered 2017-10-09: 20 mL
  Filled 2017-10-06 (×2): qty 40

## 2017-10-06 MED ORDER — TBO-FILGRASTIM 300 MCG/0.5ML ~~LOC~~ SOSY
300.0000 ug | PREFILLED_SYRINGE | Freq: Once | SUBCUTANEOUS | Status: AC
  Administered 2017-10-06: 300 ug via SUBCUTANEOUS

## 2017-10-06 MED ORDER — LIDOCAINE-PRILOCAINE 2.5-2.5 % EX CREA: TOPICAL_CREAM | Freq: Once | CUTANEOUS | Status: DC

## 2017-10-06 MED ORDER — DEXTROSE 5 % IV SOLN
2.0000 g | Freq: Three times a day (TID) | INTRAVENOUS | Status: DC
  Administered 2017-10-07 – 2017-10-09 (×8): 2 g via INTRAVENOUS
  Filled 2017-10-06 (×8): qty 2

## 2017-10-06 MED ORDER — LORAZEPAM 0.5 MG PO TABS: 0.5000 mg | ORAL_TABLET | Freq: Four times a day (QID) | ORAL | Status: DC | PRN

## 2017-10-06 MED ORDER — VANCOMYCIN HCL IN DEXTROSE 750-5 MG/150ML-% IV SOLN
750.0000 mg | Freq: Two times a day (BID) | INTRAVENOUS | Status: DC
  Administered 2017-10-07: 750 mg via INTRAVENOUS
  Filled 2017-10-06: qty 150

## 2017-10-06 MED ORDER — ACETAMINOPHEN 325 MG PO TABS
650.0000 mg | ORAL_TABLET | Freq: Four times a day (QID) | ORAL | Status: DC | PRN
  Administered 2017-10-07 – 2017-10-09 (×5): 650 mg via ORAL
  Filled 2017-10-06 (×5): qty 2

## 2017-10-06 MED ORDER — ONDANSETRON HCL 4 MG/2ML IJ SOLN: 4.0000 mg | Freq: Four times a day (QID) | INTRAMUSCULAR | Status: DC | PRN

## 2017-10-06 MED ORDER — MORPHINE SULFATE (PF) 4 MG/ML IV SOLN
INTRAVENOUS | Status: AC
  Filled 2017-10-06: qty 1

## 2017-10-06 NOTE — Progress Notes (Signed)
These preliminary result these preliminary results were noted.  Awaiting final report.

## 2017-10-06 NOTE — Progress Notes (Signed)
Pharmacy Antibiotic Note  [name] is a 55 y.o. male admitted on 10/06/2017 with febrile neutropenia.  Pharmacy has been consulted for vancomycin and cefepime dosing.  Plan: Vancomycin 1g x 1 then 750 mg IV q12h. Cefepime 2g IV q8h.  Height: 5\' 7"  (170.2 cm) Weight: 111 lb 5.3 oz (50.5 kg) IBW/kg (Calculated) : 66.1  Temp (24hrs), Avg:101.4 F (38.6 C), Min:101 F (38.3 C), Max:101.8 F (38.8 C)  Recent Labs  Lab 10/06/17 1254  WBC 1.7*  CREATININE 0.7    Estimated Creatinine Clearance: 74.5 mL/min (by C-G formula based on SCr of 0.7 mg/dL).    No Known Allergies  Antimicrobials this admission: 12/19 Vancomycin >>  12/19 Cefepime >>   Dose adjustments this admission:  Microbiology results:  Thank you for allowing pharmacy to be a part of this patient's care.  Hershal Coria 10/06/2017 6:32 PM

## 2017-10-06 NOTE — H&P (Signed)
History and Physical    [name] ZOX:[id number] DOB: 26-Sep-1961 DOA: 10/06/2017  PCP: [facility]  Patient coming from: PCP office.   I have personally briefly reviewed patient's old medical records in [place]  Chief Complaint: sent from oncology office for fever   HPI: [name] is a 56 y.o. male with medical history significant of LEFT tonsil cancer under the care of Dr Lebron Conners s/p one cycle of chemotherapy and taxol  Completed 5 days of chemo, with concurrent radiation therapy under Dr Isidore Moos, was seen in oncology office for another session of chemo, but was found ot have fever, generalized weakness, one week of diarrhea, associated with nausea and dry retching,. He also reports not feeling good for one week. He underwent G tube on 11/26 and has been on tube feeds since then. He also reports throat pain, cough, with blood tinged sputum . He also reports blood in stools occasionally. He denies any abdominal pain.  Some dizziness , but no syncope.  No headache or tingling or numbness.  No chest pain or sob.      Review of Systems: reviewed and negative, see HPI for positive pertinent ROS.   Past Medical History:  Diagnosis Date  . Cancer [facility])    left tonsil cancer    Past Surgical History:  Procedure Laterality Date  . IR FLUORO GUIDE PORT INSERTION RIGHT  09/13/2017  . IR GASTROSTOMY TUBE MOD SED  09/13/2017  . IR US GUIDE VASC ACCESS RIGHT  09/13/2017  . MULTIPLE EXTRACTIONS WITH ALVEOLOPLASTY N/A 08/09/2017   Procedure: Extraction of tooth #'s 1-6, 11-17,21,22,26-30 and 32 with alveoloplasty and bilateral mandibular tori reductions;  Surgeon: Lenn Cal, DDS;  Location: [facility];  Service: Oral Surgery;  Laterality: N/A;  . tonsil biopsy     left tonsil     reports that he has been smoking.  He has a 30.00 pack-year smoking history. he has never used smokeless tobacco. He reports that he drinks about 4.2 oz of alcohol per week. He reports  that he does not use drugs.  No Known Allergies  Family History  Problem Relation Age of Onset  . Diabetes Mother    Family history reviewed and not pertinent.   Prior to Admission medications   Medication Sig Start Date End Date Taking? Authorizing Provider  acetaminophen (TYLENOL) 325 MG tablet Take 650 mg by mouth every 6 (six) hours as needed for moderate pain.    Yes [provider]  dexamethasone (DECADRON) 4 MG tablet Take 2 tablets (8 mg total) daily by mouth. Start the day after chemotherapy for 2 days. 09/01/17  Yes Ardath Sax, MD  fluconazole (DIFLUCAN) 200 MG tablet Take 1 tablet (200 mg total) by mouth daily for 14 days. 09/22/17 10/06/17 Yes Perlov, Marinell Blight, MD  ibuprofen (ADVIL,MOTRIN) 200 MG tablet Take 400 mg by mouth daily as needed.   Yes [provider]  lidocaine (XYLOCAINE) 2 % solution Mix 1 part 2%viscous lidocaine,1part H2O.Swish and/or swallow 35mL of this mixture,13min before meals and at bedtime, up to QID 09/05/17  Yes Eppie Gibson, MD  lidocaine-prilocaine (EMLA) cream Apply to affected area once 09/01/17  Yes Perlov, Marinell Blight, MD  LORazepam (ATIVAN) 0.5 MG tablet Take 1 tablet (0.5 mg total) every 6 (six) hours as needed by mouth (Nausea or vomiting). 08/31/17  Yes Ardath Sax, MD  Morphine Sulfate (MORPHINE CONCENTRATE) 10 mg / 0.5 ml concentrated solution Place 0.5 mLs (10 mg  total) into feeding tube every 4 (four) hours as needed for severe pain. 09/15/17  Yes Ardath Sax, MD  Niacin (NICOTINEX PO) Apply 1 patch topically.   Yes [provider]  Nutritional Supplements (FEEDING SUPPLEMENT, OSMOLITE 1.5 CAL,) LIQD Give 1 bottle Osmolite 1.5 via PEG 5 times daily with 60 mL free water before and after bolus. Drink or flush tube with additional 720 mL free water 3 times daily. 09/17/17  Yes Eppie Gibson, MD  nicotine polacrilex (NICOTINE MINI) 4 MG lozenge Take 1 lozenge (4 mg total) by mouth as needed for smoking  cessation. Patient not taking: Reported on 10/06/2017 08/06/17   Ardath Sax, MD  ondansetron (ZOFRAN) 8 MG tablet Take 1 tablet (8 mg total) 2 (two) times daily as needed by mouth for refractory nausea / vomiting. Start on day 3 after chemo. Patient not taking: Reported on 10/06/2017 09/01/17   Ardath Sax, MD  prochlorperazine (COMPAZINE) 10 MG tablet Take 1 tablet (10 mg total) every 6 (six) hours as needed by mouth (Nausea or vomiting). Patient not taking: Reported on 10/06/2017 09/01/17   Ardath Sax, MD    Physical Exam: Vitals:   10/06/17 1657  BP: 102/69  Pulse: 73  Resp: 18  Temp: (!) 101 F (38.3 C)  TempSrc: Oral  SpO2: 100%  Weight: 50.5 kg (111 lb 5.3 oz)  Height: 5\' 7"  (1.702 m)    Constitutional: NAD, calm, comfortable Vitals:   10/06/17 1657  BP: 102/69  Pulse: 73  Resp: 18  Temp: (!) 101 F (38.3 C)  TempSrc: Oral  SpO2: 100%  Weight: 50.5 kg (111 lb 5.3 oz)  Height: 5\' 7"  (1.702 m)   Eyes: PERRL, lids and conjunctivae pale  ENMT: Mucous membranes are dry. . Neck: normal, supple, Respiratory: clear to auscultation bilaterally, no wheezing, no crackles. Normal respiratory effort. No accessory muscle use.  Cardiovascular: Regular rate and rhythm, no murmurs / rubs / gallops. No extremity edema. 2+ pedal pulses. No carotid bruits.  Abdomen: no tenderness, no masses palpated. No hepatosplenomegaly. Bowel sounds positive. G TUBE IN PLACE.  Musculoskeletal: no clubbing / cyanosis. No joint deformity upper and lower extremities. Good ROM, no contractures. Normal muscle tone.  Skin: no rashes, lesions, ulcers. No induration Neurologic: CN 2-12 grossly intact. Sensation intact, DTR normal. Strength 5/5 in all 4.  Psychiatric: Normal judgment and insight. Alert and oriented x 3. Normal mood.     Labs on Admission: I have personally reviewed following labs and imaging studies  CBC: Recent Labs  Lab 10/06/17 1254  WBC 1.7*  NEUTROABS 1.1*    HGB 10.9*  HCT 33.1*  MCV 87.3  PLT 885*   Basic Metabolic Panel: Recent Labs  Lab 10/06/17 1254  NA 134*  K 3.8  CO2 29  GLUCOSE 145*  BUN 11.7  CREATININE 0.7  CALCIUM 9.6   GFR: Estimated Creatinine Clearance: 74.5 mL/min (by C-G formula based on SCr of 0.7 mg/dL). Liver Function Tests: Recent Labs  Lab 10/06/17 1254  AST 16  ALT 13  ALKPHOS 82  BILITOT 0.66  PROT 7.2  ALBUMIN 3.1*   No results for input(s): LIPASE, AMYLASE in the last 168 hours. No results for input(s): AMMONIA in the last 168 hours. Coagulation Profile: No results for input(s): INR, PROTIME in the last 168 hours. Cardiac Enzymes: No results for input(s): CKTOTAL, CKMB, CKMBINDEX, TROPONINI in the last 168 hours. BNP (last 3 results) No results for input(s): PROBNP in the last 8760  hours. HbA1C: No results for input(s): HGBA1C in the last 72 hours. CBG: No results for input(s): GLUCAP in the last 168 hours. Lipid Profile: No results for input(s): CHOL, HDL, LDLCALC, TRIG, CHOLHDL, LDLDIRECT in the last 72 hours. Thyroid Function Tests: No results for input(s): TSH, T4TOTAL, FREET4, T3FREE, THYROIDAB in the last 72 hours. Anemia Panel: No results for input(s): VITAMINB12, FOLATE, FERRITIN, TIBC, IRON, RETICCTPCT in the last 72 hours. Urine analysis:    Component Value Date/Time   COLORURINE YELLOW 05/14/2011 1307   APPEARANCEUR CLEAR 05/14/2011 1307   LABSPEC 1.020 10/06/2017 1456   PHURINE 6.0 10/06/2017 1456   PHURINE 6.5 05/14/2011 1307   GLUCOSEU Negative 10/06/2017 1456   HGBUR Negative 10/06/2017 1456   HGBUR SMALL (A) 05/14/2011 1307   BILIRUBINUR Negative 10/06/2017 1456   KETONESUR Negative 10/06/2017 1456   KETONESUR NEGATIVE 05/14/2011 1307   PROTEINUR Negative 10/06/2017 1456   PROTEINUR NEGATIVE 05/14/2011 1307   UROBILINOGEN 0.2 10/06/2017 1456   NITRITE Negative 10/06/2017 1456   NITRITE NEGATIVE 05/14/2011 1307   LEUKOCYTESUR Negative 10/06/2017 1456     Radiological Exams on Admission: No results found.  EKG:Not done.   Assessment/Plan Active Problems:   Neutropenic fever (HCC)   Neutropenic Fever:  - admitted for further evaluation.  - started empirically on IV vancomycin andn IV zosyn.  - blood cultures and urine cultures sent.  - CXR ordered.  - influenza pcr ordered.  - Rapid strep test.  - sputum cultures will be ordered.  - symptomatic management for dehydration with IV fluids, anti emetics, and pain control.  - monitor counts daily.   Throat pain Pain control.   Mild anemia and thrombocytopenia:  Possibly from chemo. One dose of granix ordered.    Blood in stools: Stool for occult blood.  Monitor H&H PPI.        DVT prophylaxis: SCD'S Code Status:  FULL CODE. Family Communication: NONE AT BEDSIDE.  Disposition Plan: PENDING BLOOD CULTURES.  Consults called: NONE.  Admission status: INPATIENT/TELE.   Hosie Poisson MD [facility] Pager [phone] If 7PM-7AM, please contact night-coverage [email or web address] Password [facility]  10/06/2017, 6:24 PM

## 2017-10-06 NOTE — Progress Notes (Signed)
Patient is a direct admit one from doctor office, arrived to [place] at 1700; alert and oriented x4; notified flow manager and MD at the same time.

## 2017-10-06 NOTE — Progress Notes (Signed)
[facility] Progress Note   [name] [id number] 07-19-1961 56 y.o.  [name] is managed by Dr. Lebron Conners, the patient is being followed by Dr. Heath Lark while Dr. Lebron Conners is away.  Actively treated with chemotherapy: yes  Current Therapy: Carboplatin and Taxol with concurrent radiation  Last Treated: Chemotherapy: 09/29/2017, radiation therapy: 10/06/2017, radiation therapy is being managed by Dr. Isidore Moos  Assessment: Plan:    Febrile neutropenia ([place]) - Plan: Urinalysis with microscopic, Urine Culture, Culture, Blood, Culture, Blood, ceFEPIme (MAXIPIME) 2 g in dextrose 5 % 50 mL IVPB, Tbo-Filgrastim (GRANIX) injection 300 mcg, DG Chest 2 View, DG Chest 2 View  Squamous cell carcinoma of left tonsil (HCC)   Febrile neutropenia: The patient is being directly admitted to [facility] under the care of Dr. Ivor Costa. A urinalysis, urine culture, and blood culture x2 were ordered.  A chest x-ray has been ordered with plans that it be completed once the patient is admitted.  He was given cefepime 2 g IV x1 and Granix 300 x 1.  Recommendations would be to repeat his dosing of Granix tomorrow.  Squamous cell carcinoma of the left tonsil: The patient is status post cycle 5-day 1 of chemotherapy dosed on 09/29/2017.  He continues on daily radiation therapy which he has received since 09/02/2017.  I have contacted Dr. Isidore Moos to inform them that the patient would be admitted tomorrow and would likely have a delay in his radiation therapy.  We appreciate Dr. Soledad Gerlach Niu's assistance in directly admitting this patient to [facility] for care.  Please see After Visit Summary for patient specific instructions.  Future Appointments  Date Time Provider [place]  10/07/2017  1:15 PM [facility] LINAC 3 [facility] None  10/08/2017  1:15 PM [facility] LINAC 3 [facility] None  10/11/2017  1:15 PM [facility] LINAC 3 [facility] None  10/13/2017 12:15 PM [facility]  LAB 3 [facility] None  10/13/2017  1:15 PM [facility] LINAC 3 [facility] None  10/14/2017  1:15 PM [facility] LINAC 3 [facility] None  10/14/2017  1:45 PM Neff, Barbara L, RD [facility] None  10/14/2017  2:30 PM [facility] C9 [facility] None  10/15/2017  1:15 PM [facility] LINAC 3 [facility] None  10/18/2017  1:15 PM [facility] LINAC 3 [facility] None  10/20/2017  1:15 PM [facility] LINAC 3 [facility] None  10/21/2017  1:15 PM [facility] LINAC 3 [facility] None  10/21/2017  1:45 PM Neff, Barbara L, RD [facility] None  10/22/2017  1:15 PM [facility] LINAC 3 [facility] None  10/22/2017  1:40 PM Lebron Conners, Marinell Blight, MD [facility] None  10/25/2017  1:15 PM [facility] LINAC 3 [facility] None  11/22/2017 10:15 AM Lenn Cal, DDS [facility] None    Orders Placed This Encounter  Procedures  . Urine Culture  . Culture, Blood  . Culture, Blood  . DG Chest 2 View  . Urinalysis with microscopic       Subjective:   Patient ID:  [name] is a 56 y.o. (DOB 12/09/1960) male.  Chief Complaint: No chief complaint on file.   HPI [name] is a 56 year old male with a diagnosis of squamous cell carcinoma left tonsil dating to September 2018 when a biopsy was completed.  A CT scan of the neck showed an indistinct tonsillar enlargement on the left in the region of the left tonsillar mass biopsy.  The area of enlargement was estimated to be at 3 cm.  No metastatic adenopathy was noted.  PET/CT scan was completed on  08/16/2017 which showed hypermetabolic disease in the left palatal fossa, indeterminate level 3 lymph node which may be reactive considering the recent dental extractions.  Patient was initiated on radiation therapy on 09/01/2017 and was initiated on weekly chemotherapy with carboplatin and paclitaxel.  The patient had a G-tube placed on 09/13/2017.  He presented to the office today for consideration of cycle 6 of chemotherapy.  He was last dosed 1 week ago.  His labs  today returned with a WBC of 1.7, ANC 1.1, hemoglobin 10.9, hematocrit 33.1, and platelet count of 126.  He presents with his sister and nephew today.  They report that the patient had been having a fever last evening.  His temperature today was 101.8.  He is weak.  He has only been taking around 1 feeding and his G-tube daily.  He is is taking very little by mouth.  His sister is concerned that he is getting dehydrated and has been getting progressively weaker.  He continues to have significant throat pain.  He has a cough with clear sputum.  He acknowledges constipation.  He denies nausea vomiting or diarrhea.    Medications: I have reviewed the patient's current medications.  Allergies: No Known Allergies  Past Medical History:  Diagnosis Date  . Cancer [facility])    left tonsil cancer    Past Surgical History:  Procedure Laterality Date  . IR FLUORO GUIDE PORT INSERTION RIGHT  09/13/2017  . IR GASTROSTOMY TUBE MOD SED  09/13/2017  . IR US GUIDE VASC ACCESS RIGHT  09/13/2017  . MULTIPLE EXTRACTIONS WITH ALVEOLOPLASTY N/A 08/09/2017   Procedure: Extraction of tooth #'s 1-6, 11-17,21,22,26-30 and 32 with alveoloplasty and bilateral mandibular tori reductions;  Surgeon: Lenn Cal, DDS;  Location: [facility];  Service: Oral Surgery;  Laterality: N/A;  . tonsil biopsy     left tonsil    Family History  Problem Relation Age of Onset  . Diabetes Mother     Social History   Socioeconomic History  . Marital status: Single    Spouse name: Not on file  . Number of children: 1  . Years of education: Not on file  . Highest education level: Not on file  Social Needs  . Financial resource strain: Not on file  . Food insecurity - worry: Not on file  . Food insecurity - inability: Not on file  . Transportation needs - medical: Not on file  . Transportation needs - non-medical: Not on file  Occupational History  . Occupation: Disabled  Tobacco Use  . Smoking status: Current Every Day  Smoker    Packs/day: 1.00    Years: 30.00    Pack years: 30.00  . Smokeless tobacco: Never Used  Substance and Sexual Activity  . Alcohol use: Yes    Alcohol/week: 4.2 oz    Types: 7 Cans of beer per week    Comment: 1 pint daily   . Drug use: No    Comment: he has a past history of cocaine abuse, he denies current use  . Sexual activity: Not on file  Other Topics Concern  . Not on file  Social History Narrative  . Not on file    Past Medical History, Surgical history, Social history, and Family history were reviewed and updated as appropriate.   Please see review of systems for further details on the patient's review from today.   Review of Systems:  Review of Systems  Constitutional: Positive for appetite change, fatigue and fever. Negative  for chills and diaphoresis.  HENT: Positive for sore throat and trouble swallowing.   Respiratory: Positive for cough. Negative for chest tightness and shortness of breath.   Cardiovascular: Negative for chest pain.  Gastrointestinal: Positive for constipation. Negative for diarrhea, nausea and vomiting.  Genitourinary: Negative for difficulty urinating and dysuria.  Neurological: Positive for weakness.    Objective:   Physical Exam:  There were no vitals taken for this visit. ECOG: 2  Physical Exam  Constitutional:  The patient is an adult male who appears to be chronically ill and cachectic  HENT:  Head: Normocephalic and atraumatic.  Cardiovascular: Normal rate, regular rhythm and normal heart sounds. Exam reveals no gallop and no friction rub.  No murmur heard. Pulmonary/Chest: Effort normal and breath sounds normal. No respiratory distress. He has no wheezes. He has no rales.  Abdominal: Soft. Bowel sounds are normal. He exhibits no distension. There is no tenderness. There is no rebound and no guarding.    Musculoskeletal: He exhibits no edema.  Neurological: He is alert. Coordination normal.  Skin: Skin is warm and  dry. No rash noted. He is not diaphoretic. No erythema.    Lab Review:     Component Value Date/Time   NA 134 (L) 10/06/2017 1254   K 3.8 10/06/2017 1254   CL 97 (L) 09/13/2017 1140   CO2 29 10/06/2017 1254   GLUCOSE 145 (H) 10/06/2017 1254   BUN 11.7 10/06/2017 1254   CREATININE 0.7 10/06/2017 1254   CALCIUM 9.6 10/06/2017 1254   PROT 7.2 10/06/2017 1254   ALBUMIN 3.1 (L) 10/06/2017 1254   AST 16 10/06/2017 1254   ALT 13 10/06/2017 1254   ALKPHOS 82 10/06/2017 1254   BILITOT 0.66 10/06/2017 1254   GFRNONAA >60 09/13/2017 1140   GFRAA >60 09/13/2017 1140       Component Value Date/Time   WBC 1.7 (L) 10/06/2017 1254   WBC 3.2 (L) 09/13/2017 1140   RBC 3.80 (L) 10/06/2017 1254   RBC 4.44 09/13/2017 1140   HGB 10.9 (L) 10/06/2017 1254   HCT 33.1 (L) 10/06/2017 1254   PLT 126 (L) 10/06/2017 1254   MCV 87.3 10/06/2017 1254   MCH 28.6 10/06/2017 1254   MCH 29.5 09/13/2017 1140   MCHC 32.8 10/06/2017 1254   MCHC 34.1 09/13/2017 1140   RDW 13.5 10/06/2017 1254   LYMPHSABS 0.3 (L) 10/06/2017 1254   MONOABS 0.4 10/06/2017 1254   EOSABS 0.1 10/06/2017 1254   BASOSABS 0.0 10/06/2017 1254   -------------------------------  Imaging from last 24 hours (if applicable):  Radiology interpretation: Ir Gastrostomy Tube  Result Date: 09/13/2017 INDICATION: History of head and neck cancer. In need of gastrostomy tube placement for enteric nutrition supplementation while undergoing chemoradiation. EXAM: PULL TROUGH GASTROSTOMY TUBE PLACEMENT COMPARISON:  PET-CT - 08/16/2017 MEDICATIONS: Ancef 2 gm IV; Antibiotics were administered within 1 hour of the procedure. Glucagon 1 mg IV CONTRAST:  20 mL of Isovue 300 administered into the gastric lumen. ANESTHESIA/SEDATION: Moderate (conscious) sedation was employed during this procedure. A total of Versed 1 mg and Fentanyl 100 mcg was administered intravenously. Moderate Sedation Time: 18 minutes. The patient's level of consciousness and vital  signs were monitored continuously by radiology nursing throughout the procedure under my direct supervision. FLUOROSCOPY TIME:  2 minutes 6 seconds (7 mGy) COMPLICATIONS: None immediate. PROCEDURE: Informed written consent was obtained from the patient following explanation of the procedure, risks, benefits and alternatives. A time out was performed prior to the initiation of the  procedure. Ultrasound scanning was performed to demarcate the edge of the left lobe of the liver. Maximal barrier sterile technique utilized including caps, mask, sterile gowns, sterile gloves, large sterile drape, hand hygiene and Betadine prep. The left upper quadrant was sterilely prepped and draped. An oral gastric catheter was inserted into the stomach under fluoroscopy. The existing nasogastric feeding tube was removed. The left costal margin and air/barium opacified transverse colon were identified and avoided. Air was injected into the stomach for insufflation and visualization under fluoroscopy. Under sterile conditions a 17 gauge trocar needle was utilized to access the stomach percutaneously beneath the left subcostal margin after the overlying soft tissues were anesthetized with 1% Lidocaine with epinephrine. Needle position was confirmed within the stomach with aspiration of air and injection of small amount of contrast. A single T tack was deployed for gastropexy. Over an Amplatz guide wire, a 9-French sheath was inserted into the stomach. A snare device was utilized to capture the oral gastric catheter. The snare device was pulled retrograde from the stomach up the esophagus and out the oropharynx. The 20-French pull-through gastrostomy was connected to the snare device and pulled antegrade through the oropharynx down the esophagus into the stomach and then through the percutaneous tract external to the patient. The gastrostomy was assembled externally. Contrast injection confirms position in the stomach. Several spot  radiographic images were obtained in various obliquities for documentation. The patient tolerated procedure well without immediate post procedural complication. FINDINGS: After successful fluoroscopic guided placement, the gastrostomy tube is appropriately positioned with internal disc against the ventral aspect of the gastric lumen. IMPRESSION: Successful fluoroscopic insertion of a 20-French pull-through gastrostomy tube. The gastrostomy may be used immediately for medication administration and in 24 hrs for the initiation of feeds. Electronically Signed   By: Sandi Mariscal M.D.   On: 09/13/2017 15:05   Ir US Guide Vasc Access Right  Result Date: 09/13/2017 INDICATION: History of head neck cancer. In need of durable intravenous access for chemotherapy administration. EXAM: IMPLANTED PORT A CATH PLACEMENT WITH ULTRASOUND AND FLUOROSCOPIC GUIDANCE COMPARISON:  PET-CT - 08/16/2017 MEDICATIONS: Ancef 2 gm IV; The antibiotic was administered within an appropriate time interval prior to skin puncture. ANESTHESIA/SEDATION: Moderate (conscious) sedation was employed during this procedure. A total of Versed 3 mg and Fentanyl 100 mcg was administered intravenously. Moderate Sedation Time: 26 minutes. The patient's level of consciousness and vital signs were monitored continuously by radiology nursing throughout the procedure under my direct supervision. CONTRAST:  None FLUOROSCOPY TIME:  24 seconds (2 mGy) COMPLICATIONS: None immediate. PROCEDURE: The procedure, risks, benefits, and alternatives were explained to the patient. Questions regarding the procedure were encouraged and answered. The patient understands and consents to the procedure. The right neck and chest were prepped with chlorhexidine in a sterile fashion, and a sterile drape was applied covering the operative field. Maximum barrier sterile technique with sterile gowns and gloves were used for the procedure. A timeout was performed prior to the initiation  of the procedure. Local anesthesia was provided with 1% lidocaine with epinephrine. After creating a small venotomy incision, a micropuncture kit was utilized to access the internal jugular vein. Real-time ultrasound guidance was utilized for vascular access including the acquisition of a permanent ultrasound image documenting patency of the accessed vessel. The microwire was utilized to measure appropriate catheter length. A subcutaneous port pocket was then created along the upper chest wall utilizing a combination of sharp and blunt dissection. The pocket was irrigated with sterile saline.  A single lumen thin power injectable port was chosen for placement. The 8 Fr catheter was tunneled from the port pocket site to the venotomy incision. The port was placed in the pocket. The external catheter was trimmed to appropriate length. At the venotomy, an 8 Fr peel-away sheath was placed over a guidewire under fluoroscopic guidance. The catheter was then placed through the sheath and the sheath was removed. Final catheter positioning was confirmed and documented with a fluoroscopic spot radiograph. The port was accessed with a Huber needle, aspirated and flushed with heparinized saline. The venotomy site was closed with an interrupted 4-0 Vicryl suture. The port pocket incision was closed with interrupted 2-0 Vicryl suture and the skin was opposed with a running subcuticular 4-0 Vicryl suture. Dermabond and Steri-strips were applied to both incisions. Dressings were placed. The patient tolerated the procedure well without immediate post procedural complication. FINDINGS: After catheter placement, the tip lies within the superior cavoatrial junction. The catheter aspirates and flushes normally and is ready for immediate use. IMPRESSION: Successful placement of a right internal jugular approach power injectable Port-A-Cath. The catheter is ready for immediate use. Electronically Signed   By: Sandi Mariscal M.D.   On:  09/13/2017 15:00   Ir Fluoro Guide Port Insertion Right  Result Date: 09/13/2017 INDICATION: History of head neck cancer. In need of durable intravenous access for chemotherapy administration. EXAM: IMPLANTED PORT A CATH PLACEMENT WITH ULTRASOUND AND FLUOROSCOPIC GUIDANCE COMPARISON:  PET-CT - 08/16/2017 MEDICATIONS: Ancef 2 gm IV; The antibiotic was administered within an appropriate time interval prior to skin puncture. ANESTHESIA/SEDATION: Moderate (conscious) sedation was employed during this procedure. A total of Versed 3 mg and Fentanyl 100 mcg was administered intravenously. Moderate Sedation Time: 26 minutes. The patient's level of consciousness and vital signs were monitored continuously by radiology nursing throughout the procedure under my direct supervision. CONTRAST:  None FLUOROSCOPY TIME:  24 seconds (2 mGy) COMPLICATIONS: None immediate. PROCEDURE: The procedure, risks, benefits, and alternatives were explained to the patient. Questions regarding the procedure were encouraged and answered. The patient understands and consents to the procedure. The right neck and chest were prepped with chlorhexidine in a sterile fashion, and a sterile drape was applied covering the operative field. Maximum barrier sterile technique with sterile gowns and gloves were used for the procedure. A timeout was performed prior to the initiation of the procedure. Local anesthesia was provided with 1% lidocaine with epinephrine. After creating a small venotomy incision, a micropuncture kit was utilized to access the internal jugular vein. Real-time ultrasound guidance was utilized for vascular access including the acquisition of a permanent ultrasound image documenting patency of the accessed vessel. The microwire was utilized to measure appropriate catheter length. A subcutaneous port pocket was then created along the upper chest wall utilizing a combination of sharp and blunt dissection. The pocket was irrigated with  sterile saline. A single lumen thin power injectable port was chosen for placement. The 8 Fr catheter was tunneled from the port pocket site to the venotomy incision. The port was placed in the pocket. The external catheter was trimmed to appropriate length. At the venotomy, an 8 Fr peel-away sheath was placed over a guidewire under fluoroscopic guidance. The catheter was then placed through the sheath and the sheath was removed. Final catheter positioning was confirmed and documented with a fluoroscopic spot radiograph. The port was accessed with a Huber needle, aspirated and flushed with heparinized saline. The venotomy site was closed with an interrupted 4-0 Vicryl suture.  The port pocket incision was closed with interrupted 2-0 Vicryl suture and the skin was opposed with a running subcuticular 4-0 Vicryl suture. Dermabond and Steri-strips were applied to both incisions. Dressings were placed. The patient tolerated the procedure well without immediate post procedural complication. FINDINGS: After catheter placement, the tip lies within the superior cavoatrial junction. The catheter aspirates and flushes normally and is ready for immediate use. IMPRESSION: Successful placement of a right internal jugular approach power injectable Port-A-Cath. The catheter is ready for immediate use. Electronically Signed   By: Sandi Mariscal M.D.   On: 09/13/2017 15:00        This case was discussed with Dr. Alvy Bimler. She expresses agreement with my management of this patient.

## 2017-10-06 NOTE — Patient Instructions (Signed)
Dehydration, Adult Dehydration is when there is not enough fluid or water in your body. This happens when you lose more fluids than you take in. Dehydration can range from mild to very bad. It should be treated right away to keep it from getting very bad. Symptoms of mild dehydration may include:  Thirst.  Dry lips.  Slightly dry mouth.  Dry, warm skin.  Dizziness. Symptoms of moderate dehydration may include:  Very dry mouth.  Muscle cramps.  Dark pee (urine). Pee may be the color of tea.  Your body making less pee.  Your eyes making fewer tears.  Heartbeat that is uneven or faster than normal (palpitations).  Headache.  Light-headedness, especially when you stand up from sitting.  Fainting (syncope). Symptoms of very bad dehydration may include:  Changes in skin, such as: ? Cold and clammy skin. ? Blotchy (mottled) or pale skin. ? Skin that does not quickly return to normal after being lightly pinched and let go (poor skin turgor).  Changes in body fluids, such as: ? Feeling very thirsty. ? Your eyes making fewer tears. ? Not sweating when body temperature is high, such as in hot weather. ? Your body making very little pee.  Changes in vital signs, such as: ? Weak pulse. ? Pulse that is more than 100 beats a minute when you are sitting still. ? Fast breathing. ? Low blood pressure.  Other changes, such as: ? Sunken eyes. ? Cold hands and feet. ? Confusion. ? Lack of energy (lethargy). ? Trouble waking up from sleep. ? Short-term weight loss. ? Unconsciousness. Follow these instructions at home:  If told by your doctor, drink an ORS: ? Make an ORS by using instructions on the package. ? Start by drinking small amounts, about  cup (120 mL) every 5-10 minutes. ? Slowly drink more until you have had the amount that your doctor said to have.  Drink enough clear fluid to keep your pee clear or pale yellow. If you were told to drink an ORS, finish the ORS  first, then start slowly drinking clear fluids. Drink fluids such as: ? Water. Do not drink only water by itself. Doing that can make the salt (sodium) level in your body get too low (hyponatremia). ? Ice chips. ? Fruit juice that you have added water to (diluted). ? Low-calorie sports drinks.  Avoid: ? Alcohol. ? Drinks that have a lot of sugar. These include high-calorie sports drinks, fruit juice that does not have water added, and soda. ? Caffeine. ? Foods that are greasy or have a lot of fat or sugar.  Take over-the-counter and prescription medicines only as told by your doctor.  Do not take salt tablets. Doing that can make the salt level in your body get too high (hypernatremia).  Eat foods that have minerals (electrolytes). Examples include bananas, oranges, potatoes, tomatoes, and spinach.  Keep all follow-up visits as told by your doctor. This is important. Contact a doctor if:  You have belly (abdominal) pain that: ? Gets worse. ? Stays in one area (localizes).  You have a rash.  You have a stiff neck.  You get angry or annoyed more easily than normal (irritability).  You are more sleepy than normal.  You have a harder time waking up than normal.  You feel: ? Weak. ? Dizzy. ? Very thirsty.  You have peed (urinated) only a small amount of very dark pee during 6-8 hours. Get help right away if:  You have symptoms of   very bad dehydration.  You cannot drink fluids without throwing up (vomiting).  Your symptoms get worse with treatment.  You have a fever.  You have a very bad headache.  You are throwing up or having watery poop (diarrhea) and it: ? Gets worse. ? Does not go away.  You have blood or something green (bile) in your throw-up.  You have blood in your poop (stool). This may cause poop to look black and tarry.  You have not peed in 6-8 hours.  You pass out (faint).  Your heart rate when you are sitting still is more than 100 beats a  minute.  You have trouble breathing. This information is not intended to replace advice given to you by your health care provider. Make sure you discuss any questions you have with your health care provider. Document Released: 08/01/2009 Document Revised: 04/24/2016 Document Reviewed: 11/29/2015 Elsevier Interactive Patient Education  2018 [employer]  

## 2017-10-06 NOTE — Progress Notes (Signed)
Pt arrived in infusion reporting that he does not feel good. Pt came from radonc tx, accompanied by sister. Pt was accessed via PAC and started IVF's. Pt labs drawn today shows ANC 1.3. Pt reports having been febrile since yesterday. Pt unable to eat and drink due to pain and no appetite. He reports mild blood in stool, coughing up blood tinged sputum and mild nose bleed. Pt reports 9/10 throat pain, which he medicated ibuprofen prior to coming in today. Pt febrile 101.8. Notified Symptom management, Alfredia Client to further evaluate pt needs for possible hold on chemo and to give IVF's instead.

## 2017-10-06 NOTE — Progress Notes (Signed)
Pt taken to Hospital via wheelchair by Sandi Mealy NP

## 2017-10-07 ENCOUNTER — Ambulatory Visit
Admission: RE | Admit: 2017-10-07 | Discharge: 2017-10-07 | Disposition: A | Discharge status: Home / Self Care | Payer: Medicaid Other | Source: Ambulatory Visit | Attending: Radiation Oncology | Admitting: Radiation Oncology

## 2017-10-07 ENCOUNTER — Telehealth: Payer: Self-pay

## 2017-10-07 DIAGNOSIS — C099 Malignant neoplasm of tonsil, unspecified: Secondary | ICD-10-CM

## 2017-10-07 DIAGNOSIS — E871 Hypo-osmolality and hyponatremia: Secondary | ICD-10-CM

## 2017-10-07 DIAGNOSIS — R05 Cough: Secondary | ICD-10-CM

## 2017-10-07 DIAGNOSIS — D61818 Other pancytopenia: Secondary | ICD-10-CM

## 2017-10-07 DIAGNOSIS — D649 Anemia, unspecified: Secondary | ICD-10-CM

## 2017-10-07 LAB — COMPREHENSIVE METABOLIC PANEL
ALK PHOS: 59 U/L (ref 38–126)
ALT: 13 U/L — AB (ref 17–63)
AST: 16 U/L (ref 15–41)
Albumin: 2.8 g/dL — ABNORMAL LOW (ref 3.5–5.0)
Anion gap: 6 (ref 5–15)
BILIRUBIN TOTAL: 0.8 mg/dL (ref 0.3–1.2)
BUN: 8 mg/dL (ref 6–20)
CALCIUM: 8.6 mg/dL — AB (ref 8.9–10.3)
CO2: 27 mmol/L (ref 22–32)
CREATININE: 0.42 mg/dL — AB (ref 0.61–1.24)
Chloride: 99 mmol/L — ABNORMAL LOW (ref 101–111)
Glucose, Bld: 113 mg/dL — ABNORMAL HIGH (ref 65–99)
Potassium: 3.8 mmol/L (ref 3.5–5.1)
Sodium: 132 mmol/L — ABNORMAL LOW (ref 135–145)
TOTAL PROTEIN: 6.1 g/dL — AB (ref 6.5–8.1)

## 2017-10-07 LAB — RETICULOCYTES
RBC.: 3.19 MIL/uL — AB (ref 4.22–5.81)
RETIC COUNT ABSOLUTE: 28.7 10*3/uL (ref 19.0–186.0)
Retic Ct Pct: 0.9 % (ref 0.4–3.1)

## 2017-10-07 LAB — CBC
HEMATOCRIT: 28 % — AB (ref 39.0–52.0)
HEMOGLOBIN: 9.3 g/dL — AB (ref 13.0–17.0)
MCH: 28.6 pg (ref 26.0–34.0)
MCHC: 33.2 g/dL (ref 30.0–36.0)
MCV: 86.2 fL (ref 78.0–100.0)
PLATELETS: 113 10*3/uL — AB (ref 150–400)
RBC: 3.25 MIL/uL — AB (ref 4.22–5.81)
RDW: 13.3 % (ref 11.5–15.5)
WBC: 3.1 10*3/uL — AB (ref 4.0–10.5)

## 2017-10-07 LAB — INFLUENZA PANEL BY PCR (TYPE A & B)
Influenza A By PCR: NEGATIVE
Influenza B By PCR: NEGATIVE

## 2017-10-07 LAB — DIFFERENTIAL
BASOS ABS: 0 10*3/uL (ref 0.0–0.1)
BASOS PCT: 0 %
EOS ABS: 0.1 10*3/uL (ref 0.0–0.7)
EOS PCT: 2 %
LYMPHS ABS: 0.3 10*3/uL — AB (ref 0.7–4.0)
Lymphocytes Relative: 11 %
Monocytes Absolute: 0.6 10*3/uL (ref 0.1–1.0)
Monocytes Relative: 20 %
NEUTROS PCT: 67 %
Neutro Abs: 2.1 10*3/uL (ref 1.7–7.7)

## 2017-10-07 LAB — IRON AND TIBC
IRON: 18 ug/dL — AB (ref 45–182)
Saturation Ratios: 8 % — ABNORMAL LOW (ref 17.9–39.5)
TIBC: 223 ug/dL — ABNORMAL LOW (ref 250–450)
UIBC: 205 ug/dL

## 2017-10-07 LAB — FERRITIN: Ferritin: 245 ng/mL (ref 24–336)

## 2017-10-07 LAB — URINE CULTURE: ORGANISM ID, BACTERIA: NO GROWTH

## 2017-10-07 LAB — VITAMIN B12: Vitamin B-12: 586 pg/mL (ref 180–914)

## 2017-10-07 LAB — FOLATE: FOLATE: 18.5 ng/mL (ref >5.9)

## 2017-10-07 MED ORDER — OSMOLITE 1.5 CAL PO LIQD
237.0000 mL | Freq: Every day | ORAL | Status: DC
  Administered 2017-10-07 – 2017-10-09 (×9): 237 mL
  Filled 2017-10-07 (×12): qty 237

## 2017-10-07 MED ORDER — ENSURE ENLIVE PO LIQD
237.0000 mL | Freq: Two times a day (BID) | ORAL | Status: DC
  Administered 2017-10-07 – 2017-10-09 (×4): 237 mL

## 2017-10-07 MED ORDER — SODIUM CHLORIDE 0.9 % IV SOLN
500.0000 mg | Freq: Two times a day (BID) | INTRAVENOUS | Status: DC
  Administered 2017-10-07 – 2017-10-09 (×4): 500 mg via INTRAVENOUS
  Filled 2017-10-07 (×5): qty 500

## 2017-10-07 NOTE — Progress Notes (Signed)
PROGRESS NOTE    [name]  XHB:[id number] DOB: 03/27/1961 DOA: 10/06/2017 PCP: Beaulah Corin, [facility]    Brief Narrative: [name] is a 56 y.o. male with medical history significant of LEFT tonsil cancer under the care of Dr Lebron Conners s/p one cycle of chemotherapy and taxol  Completed 5 days of chemo, with concurrent radiation therapy under Dr Isidore Moos, was seen in oncology office for another session of chemo, but was found ot have fever, generalized weakness, one week of diarrhea, associated with nausea and dry retching,.    Assessment & Plan:   Active Problems:   Squamous cell carcinoma of left tonsil (HCC)   Pancytopenia, acquired (HCC)   Neutropenic fever (HCC)   Neutropenic fever:  Work up so far negative.  Blood cultures and urine cultures are pending.  Influenza PCR is negative.  Rapid strep is negative, chest x-ray is negative for acute pneumonia. Patient's absolute count has improved.  Patient denies any hemoptysis. Continue with IV antibiotics for another 24 hours before transitioning to oral antibiotics.  Continue gentle hydration   Moderate protein calorie malnutrition Nutrition consulted Resume G-tube feeds and regular diet.   Hyponatremia probably from dehydration. Continue to monitor   Anemia of chronic disease Secondary to chemotherapy. Baseline hemoglobin around 13, dropped to 9.3 today.  Anemia PANEL will be sent.  Stool for occult blood ordered.  Mild thrombocytopenia Probably from chemo therapy   Squamous cell carcinoma of the left tonsil Further chemotherapy is on hold.  Outpatient follow-up with Dr.Gorsuch.    Rectal bleeding. / Acute anemia of blood loss Stool for occult blood.  No more episodes of blood per rectum since last night.  Monitor H&H.  Continue with PPI.  Transfuse to keep hemoglobin greater than 7.       DVT prophylaxis: scd's Code Status: Full code Family Communication: None at bedside Disposition Plan: Pending  blood culture report.   Consultants:   Oncology.    Procedures: none.    Antimicrobials: vancomycin and cefepime since admission.    Subjective: Wants to eat food.   Objective: Vitals:   10/06/17 1657 10/06/17 2143 10/07/17 0403  BP: 102/69 100/65 101/62  Pulse: 73 81 75  Resp: 18 18 18   Temp: (!) 101 F (38.3 C) 99.4 F (37.4 C) 99.6 F (37.6 C)  TempSrc: Oral Oral Oral  SpO2: 100% 100% 100%  Weight: 50.5 kg (111 lb 5.3 oz)    Height: 5\' 7"  (1.702 m)      Intake/Output Summary (Last 24 hours) at 10/07/2017 0851 Last data filed at 10/07/2017 0730 Gross per 24 hour  Intake 1146.67 ml  Output -  Net 1146.67 ml   Filed Weights   10/06/17 1657  Weight: 50.5 kg (111 lb 5.3 oz)    Examination:  General exam: Appears calm and comfortable not in distress.  Respiratory system: Clear to auscultation. Respiratory effort normal. Cardiovascular system: S1 & S2 heard, RRR. No JVD, murmurs, rubs, gallops or clicks. No pedal edema. Gastrointestinal system: Abdomen is nondistended, soft and nontender. No organomegaly or masses felt. Normal bowel sounds heard.  G-tube in place Central nervous system: Alert and oriented. No focal neurological deficits. Extremities: Symmetric 5 x 5 power. Skin: No rashes, lesions or ulcers Psychiatry: Judgement and insight appear normal. Mood & affect appropriate.     Data Reviewed: I have personally reviewed following labs and imaging studies  CBC: Recent Labs  Lab 10/06/17 1254 10/07/17 0500  WBC 1.7* 3.1*  NEUTROABS 1.1* 2.1  HGB 10.9* 9.3*  HCT 33.1* 28.0*  MCV 87.3 86.2  PLT 126* 767*   Basic Metabolic Panel: Recent Labs  Lab 10/06/17 1254 10/07/17 0500  NA 134* 132*  K 3.8 3.8  CL  --  99*  CO2 29 27  GLUCOSE 145* 113*  BUN 11.7 8  CREATININE 0.7 0.42*  CALCIUM 9.6 8.6*   GFR: Estimated Creatinine Clearance: 74.5 mL/min (A) (by C-G formula based on SCr of 0.42 mg/dL (L)). Liver Function Tests: Recent Labs    Lab 10/06/17 1254 10/07/17 0500  AST 16 16  ALT 13 13*  ALKPHOS 82 59  BILITOT 0.66 0.8  PROT 7.2 6.1*  ALBUMIN 3.1* 2.8*   No results for input(s): LIPASE, AMYLASE in the last 168 hours. No results for input(s): AMMONIA in the last 168 hours. Coagulation Profile: No results for input(s): INR, PROTIME in the last 168 hours. Cardiac Enzymes: No results for input(s): CKTOTAL, CKMB, CKMBINDEX, TROPONINI in the last 168 hours. BNP (last 3 results) No results for input(s): PROBNP in the last 8760 hours. HbA1C: No results for input(s): HGBA1C in the last 72 hours. CBG: No results for input(s): GLUCAP in the last 168 hours. Lipid Profile: No results for input(s): CHOL, HDL, LDLCALC, TRIG, CHOLHDL, LDLDIRECT in the last 72 hours. Thyroid Function Tests: No results for input(s): TSH, T4TOTAL, FREET4, T3FREE, THYROIDAB in the last 72 hours. Anemia Panel: No results for input(s): VITAMINB12, FOLATE, FERRITIN, TIBC, IRON, RETICCTPCT in the last 72 hours. Sepsis Labs: No results for input(s): PROCALCITON, LATICACIDVEN in the last 168 hours.  Recent Results (from the past 240 hour(s))  Rapid Strep Screen (Not at [facility])     Status: None   Collection Time: 10/06/17 10:44 PM  Result Value Ref Range Status   Streptococcus, Group A Screen (Direct) NEGATIVE NEGATIVE Final    Comment: (NOTE) A Rapid Antigen test may result negative if the antigen level in the sample is below the detection level of this test. The FDA has not cleared this test as a stand-alone test therefore the rapid antigen negative result has reflexed to a Group A Strep culture.          Radiology Studies: Dg Chest 2 View  Result Date: 10/06/2017 CLINICAL DATA:  Cough, weakness.  Fever. EXAM: CHEST  2 VIEW COMPARISON:  03/12/2015 FINDINGS: Right Port-A-Cath is in place with the tip at the cavoatrial junction. Mild hyperinflation. Heart and mediastinal contours are within normal limits. No focal opacities or  effusions. No acute bony abnormality. IMPRESSION: Mild hyperinflation.  No active cardiopulmonary disease. Electronically Signed   By: Rolm Baptise M.D.   On: 10/06/2017 20:27        Scheduled Meds: . feeding supplement (OSMOLITE 1.5 CAL)  237 mL Per Tube 5 X Daily  . fluconazole  200 mg Oral Daily  . pantoprazole (PROTONIX) IV  40 mg Intravenous Q24H   Continuous Infusions: . sodium chloride 100 mL/hr at 10/07/17 0528  . ceFEPime (MAXIPIME) IV Stopped (10/07/17 0805)  . vancomycin       LOS: 1 day    Time spent: 35 minutes.     Hosie Poisson, MD [facility] Pager [phone]   If 7PM-7AM, please contact night-coverage [email or web address] Password [facility] 10/07/2017, 8:51 AM

## 2017-10-07 NOTE — Progress Notes (Signed)
[name]   DOB:[birth year]   FB#:[id number]    Subjective: Patient is well-known to me.  He was admitted from the outpatient clinic yesterday after presentation with neutropenic fever.  Cultures are pending.  Chest x-ray is unremarkable for pneumonia.  He has received broad-spectrum IV antibiotics.  He complained of persistent sore throat.  He has mild productive cough, streak with blood occasionally.  Objective:  Vitals:   10/06/17 2143 10/07/17 0403  BP: 100/65 101/62  Pulse: 81 75  Resp: 18 18  Temp: 99.4 F (37.4 C) 99.6 F (37.6 C)  SpO2: 100% 100%     Intake/Output Summary (Last 24 hours) at 10/07/2017 0830 Last data filed at 10/07/2017 0730 Gross per 24 hour  Intake 1146.67 ml  Output -  Net 1146.67 ml    GENERAL:alert, no distress and comfortable.  He looks thin and cachectic SKIN: skin color, texture, turgor are normal, no rashes or significant lesions EYES: normal, Conjunctiva are pink and non-injected, sclera clear OROPHARYNX: Noted signs of mucositis.  No thrush NECK: supple, thyroid normal size, non-tender, without nodularity LYMPH:  no palpable lymphadenopathy in the cervical, axillary or inguinal LUNGS: clear to auscultation and percussion with normal breathing effort HEART: regular rate & rhythm and no murmurs and no lower extremity edema ABDOMEN:abdomen soft, non-tender and normal bowel sounds Musculoskeletal:no cyanosis of digits and no clubbing  NEURO: alert & oriented x 3 with fluent speech, no focal motor/sensory deficits   Labs:  Lab Results  Component Value Date   WBC 3.1 (L) 10/07/2017   HGB 9.3 (L) 10/07/2017   HCT 28.0 (L) 10/07/2017   MCV 86.2 10/07/2017   PLT 113 (L) 10/07/2017   NEUTROABS 2.1 10/07/2017    Lab Results  Component Value Date   NA 132 (L) 10/07/2017   K 3.8 10/07/2017   CL 99 (L) 10/07/2017   CO2 27 10/07/2017    Studies:  Dg Chest 2 View  Result Date: 10/06/2017 CLINICAL DATA:  Cough, weakness.  Fever. EXAM:  CHEST  2 VIEW COMPARISON:  03/12/2015 FINDINGS: Right Port-A-Cath is in place with the tip at the cavoatrial junction. Mild hyperinflation. Heart and mediastinal contours are within normal limits. No focal opacities or effusions. No acute bony abnormality. IMPRESSION: Mild hyperinflation.  No active cardiopulmonary disease. Electronically Signed   By: Rolm Baptise M.D.   On: 10/06/2017 20:27    Assessment & Plan:   Neutropenic fever Chemotherapy this week was placed on hold he has received G-CSF support recently White count is much improved He will need to continue on IV antibiotics for minimum 48 hours before transitioning to oral antibiotics therapy, depending on cultures results  Acquired pancytopenia Likely due to recent chemotherapy He does not need blood transfusion  Moderate protein calorie malnutrition Secondary to dysphagia from his cancer The patient desires regular diet and I have placed the order He will continue nutritional supplement through with his feeding tube as well  Hyponatremia Likely due to poor oral intake.  Observe.  Discharge planning Unlikely today, probably tomorrow evening or Saturday   Rozann Holts, MD 10/07/2017  8:30 AM

## 2017-10-07 NOTE — Progress Notes (Signed)
Initial Nutrition Assessment  DOCUMENTATION CODES:   Non-severe (moderate) malnutrition in context of acute illness/injury, Underweight  INTERVENTION:   -Continue home regimen of Osmolite 1.5, 1 carton (237 ml) 5 times daily.  -Will order Ensure Enlive BID via PEG or PO, each supplement provides 350 kcal and 20 grams of protein -Continue to flush with 60 ml H2O before and after each bolus. -This provides 2475 kcal, 114g protein and 1865 ml H2O.   RD will continue to monitor for needs  NUTRITION DIAGNOSIS:   Moderate Malnutrition related to acute illness, cancer and cancer related treatments as evidenced by percent weight loss, moderate fat depletion, moderate muscle depletion.  GOAL:   Patient will meet greater than or equal to 90% of their needs  MONITOR:   PO intake, Labs, Weight trends, TF tolerance, I & O's  REASON FOR ASSESSMENT:   Consult Enteral/tube feeding initiation and management  ASSESSMENT:   56 y.o. male with medical history significant of LEFT tonsil cancer under the care of Dr Lebron Conners s/p one cycle of chemotherapy and taxol  Completed 5 days of chemo, with concurrent radiation therapy under Dr Isidore Moos, was seen in oncology office for another session of chemo, but was found ot have fever, generalized weakness, one week of diarrhea, associated with nausea and dry retching,. 11/26: PEG placed  Patient reports he was tolerating his tube feeds at home with no issues. Pt was having nausea and dry heaving but nothing was coming up. Pt is tolerating his bolus feeds now with no issues. He is administering his boluses himself. States he was administering additional cartons of Ensure as advised by [address]. Pt states he hasn't been able to swallow anything. He's been ordered a regular diet and has yogurt sitting at bedside untouched.  RD will update TF orders and free water recommendations.  Per chart review, pt has lost 26 lb since 10/22 (19% wt loss x 2 months,  significant for time frame).   Medications: IV Protonix every 24 hours Labs reviewed: Low Na   NUTRITION - FOCUSED PHYSICAL EXAM:    Most Recent Value  Orbital Region  No depletion  Upper Arm Region  Moderate depletion  Thoracic and Lumbar Region  Mild depletion  Buccal Region  Mild depletion  Temple Region  Mild depletion  Clavicle Bone Region  Moderate depletion  Clavicle and Acromion Bone Region  Moderate depletion  Scapular Bone Region  Unable to assess  Dorsal Hand  Moderate depletion  Patellar Region  Unable to assess  Anterior Thigh Region  Unable to assess  Posterior Calf Region  Unable to assess  Edema (RD Assessment)  None       Diet Order:  Diet regular Room service appropriate? Yes; Fluid consistency: Thin  EDUCATION NEEDS:   Education needs have been addressed  Skin:  Skin Assessment: Reviewed RN Assessment  Last BM:  12/19  Height:   Ht Readings from Last 1 Encounters:  10/06/17 5\' 7"  (1.702 m)    Weight:   Wt Readings from Last 1 Encounters:  10/06/17 111 lb 5.3 oz (50.5 kg)    Ideal Body Weight:  67.2 kg  BMI:  Body mass index is 17.44 kg/m.  Estimated Nutritional Needs:   Kcal:  0160-1093  Protein:  75-90g  Fluid:  2L/day   Clayton Bibles, MS, RD, LDN [place] [facility] Pager: [phone] After Hours Pager: [phone]

## 2017-10-07 NOTE — Progress Notes (Signed)
Pharmacy Antibiotic Note  [name] is a 56 y.o. male admitted on 10/06/2017 with febrile neutropenia.  Pharmacy was consulted for vancomycin and cefepime dosing.  Today, 10/07/2017: Temp improved  Leukopenia improved (no ANC result this AM) Hemodynamically stable SCr improved, but likely artifactually low given TBW < IBW.  Plan: Reduce vancomycin to 500 mg IV q12h (TBW < IBW, used SCr of 0.7 from admission, estimated AUC on new dosage is 406 when using TBW in Ke calculation). Continue cefepime 2g IV q8h. Follow renal function, culture results, clinical course.  Height: 5\' 7"  (170.2 cm) Weight: 111 lb 5.3 oz (50.5 kg) IBW/kg (Calculated) : 66.1  Temp (24hrs), Avg:100.6 F (38.1 C), Min:99.4 F (37.4 C), Max:101.8 F (38.8 C)  Recent Labs  Lab 10/06/17 1254 10/07/17 0500  WBC 1.7* 3.1*  CREATININE 0.7 0.42*    Estimated Creatinine Clearance: 74.5 mL/min (A) (by C-G formula based on SCr of 0.42 mg/dL (L)).    No Known Allergies  Antimicrobials this admission: 12/19 Vancomycin >>  12/19 Cefepime >>   Dose adjustments this admission: 12/20 Reduced vancomycin from 750 mg q12h to 500 mg q12h  Microbiology results: 12/19: Rapid Grp A strep oral mucosa: negative 12/19: Throat culture for Grp A strep: collected 12/19: Influenza A & B: negative 12/19: BCx: ordered 12/19: Sputum cx: ordered   Thank you for allowing pharmacy to be a part of this patient's care.  Clayburn Pert, PharmD, BCPS Pager: [phone] 10/07/2017  6:18 AM

## 2017-10-07 NOTE — Telephone Encounter (Signed)
I spoke with Mr. Brum nurse on [place]. He was admitted with a neutropenic fever yesterday. Dr. Isidore Moos would like to treat him with his scheduled radiation therapy today. His nurse feels like he is doing well and can be treated today. I informed her that his appointment is for 1:15 pm today and the transporter should be there at 1:00 to get him. She voiced her understanding.

## 2017-10-07 NOTE — Care Management Note (Signed)
Case Management Note  Patient Details  Name: [name] MRN: [id number] Date of Birth: November 09, 1960  Subjective/Objective:                  Sepsis with chemo patient with hx of tonsillar ca  Action/Plan: Date: October 07, 2017 Velva Harman, BSN, [place], [place] Chart and notes review for patient progress and needs. Will follow for case management and discharge needs. Next review date: [id number]  Expected Discharge Date:  (unknown)               Expected Discharge Plan:  Home/Self Care  In-House Referral:     Discharge planning Services  CM Consult  Post Acute Care Choice:    Choice offered to:     DME Arranged:    DME Agency:     HH Arranged:    HH Agency:     Status of Service:  In process, will continue to follow  If discussed at Long Length of Stay Meetings, dates discussed:    Additional Comments:  Leeroy Cha, RN 10/07/2017, 8:21 AM

## 2017-10-08 ENCOUNTER — Ambulatory Visit
Admission: RE | Admit: 2017-10-08 | Discharge: 2017-10-08 | Disposition: A | Discharge status: Home / Self Care | Payer: Medicaid Other | Source: Ambulatory Visit | Attending: Radiation Oncology | Admitting: Radiation Oncology

## 2017-10-08 DIAGNOSIS — D638 Anemia in other chronic diseases classified elsewhere: Secondary | ICD-10-CM

## 2017-10-08 DIAGNOSIS — E440 Moderate protein-calorie malnutrition: Secondary | ICD-10-CM

## 2017-10-08 DIAGNOSIS — E871 Hypo-osmolality and hyponatremia: Secondary | ICD-10-CM

## 2017-10-08 DIAGNOSIS — E43 Unspecified severe protein-calorie malnutrition: Secondary | ICD-10-CM

## 2017-10-08 LAB — CBC WITH DIFFERENTIAL/PLATELET
BASOS ABS: 0 10*3/uL (ref 0.0–0.1)
BASOS PCT: 0 %
EOS ABS: 0 10*3/uL (ref 0.0–0.7)
Eosinophils Relative: 1 %
HEMATOCRIT: 27.2 % — AB (ref 39.0–52.0)
Hemoglobin: 9.1 g/dL — ABNORMAL LOW (ref 13.0–17.0)
Lymphocytes Relative: 11 %
Lymphs Abs: 0.4 10*3/uL — ABNORMAL LOW (ref 0.7–4.0)
MCH: 28.5 pg (ref 26.0–34.0)
MCHC: 33.5 g/dL (ref 30.0–36.0)
MCV: 85.3 fL (ref 78.0–100.0)
MONO ABS: 0.3 10*3/uL (ref 0.1–1.0)
MONOS PCT: 10 %
NEUTROS ABS: 2.6 10*3/uL (ref 1.7–7.7)
Neutrophils Relative %: 78 %
PLATELETS: 110 10*3/uL — AB (ref 150–400)
RBC: 3.19 MIL/uL — ABNORMAL LOW (ref 4.22–5.81)
RDW: 13.3 % (ref 11.5–15.5)
WBC: 3.3 10*3/uL — ABNORMAL LOW (ref 4.0–10.5)

## 2017-10-08 LAB — CREATININE, SERUM: CREATININE: 0.41 mg/dL — AB (ref 0.61–1.24)

## 2017-10-08 MED ORDER — LIDOCAINE-PRILOCAINE 2.5-2.5 % EX CREA
TOPICAL_CREAM | Freq: Once | CUTANEOUS | Status: DC
  Filled 2017-10-08 (×2): qty 5

## 2017-10-08 NOTE — Progress Notes (Signed)
These preliminary result these preliminary results were noted.  Awaiting final report.

## 2017-10-08 NOTE — Progress Notes (Signed)
[name]   DOB:[birth year]   QQ#:[id number]    Subjective: He felt a bit better.  Has been afebrile.  He has been straining for stool and had mild bleeding yesterday evening.  He had no further bleeding since.  Cough is improving  Objective:  Vitals:   10/07/17 2112 10/08/17 0500  BP: (!) 98/57 104/60  Pulse: 66 62  Resp: 15 16  Temp: 99 F (37.2 C) 98.9 F (37.2 C)  SpO2: 100% 100%     Intake/Output Summary (Last 24 hours) at 10/08/2017 0826 Last data filed at 10/08/2017 0501 Gross per 24 hour  Intake 1303.33 ml  Output 225 ml  Net 1078.33 ml    GENERAL:alert, no distress and comfortable.  He looks thin and cachectic SKIN: skin color, texture, turgor are normal, no rashes or significant lesions EYES: normal, Conjunctiva are pink and non-injected, sclera clear OROPHARYNX:no exudate, no erythema and lips, buccal mucosa, and tongue normal  NECK: supple, thyroid normal size, non-tender, without nodularity LYMPH:  no palpable lymphadenopathy in the cervical, axillary or inguinal LUNGS: clear to auscultation and percussion with normal breathing effort HEART: regular rate & rhythm and no murmurs and no lower extremity edema ABDOMEN:abdomen soft, non-tender and normal bowel sounds Musculoskeletal:no cyanosis of digits and no clubbing  NEURO: alert & oriented x 3 with fluent speech, no focal motor/sensory deficits   Labs:  Lab Results  Component Value Date   WBC 3.3 (L) 10/08/2017   HGB 9.1 (L) 10/08/2017   HCT 27.2 (L) 10/08/2017   MCV 85.3 10/08/2017   PLT 110 (L) 10/08/2017   NEUTROABS 2.6 10/08/2017    Lab Results  Component Value Date   NA 132 (L) 10/07/2017   K 3.8 10/07/2017   CL 99 (L) 10/07/2017   CO2 27 10/07/2017    Studies:  Dg Chest 2 View  Result Date: 10/06/2017 CLINICAL DATA:  Cough, weakness.  Fever. EXAM: CHEST  2 VIEW COMPARISON:  03/12/2015 FINDINGS: Right Port-A-Cath is in place with the tip at the cavoatrial junction. Mild hyperinflation.  Heart and mediastinal contours are within normal limits. No focal opacities or effusions. No acute bony abnormality. IMPRESSION: Mild hyperinflation.  No active cardiopulmonary disease. Electronically Signed   By: Rolm Baptise M.D.   On: 10/06/2017 20:27    Assessment & Plan:   Neutropenic fever Chemotherapy this week was placed on hold he has received G-CSF support recently White count is much improved All cultures are negative.  He could potentially be transitioned to oral antibiotics and be discharged Oral Augmentin might be a good choice for him for 7 days treatment  Mild rectal bleeding Likely secondary to constipation If he has no further bleeding this afternoon, he can be discharged  Acquired pancytopenia Likely due to recent chemotherapy He does not need blood transfusion  Moderate protein calorie malnutrition Secondary to dysphagia from his cancer He will continue nutritional supplement through with his feeding tube as well  Hyponatremia Likely due to poor oral intake.  Observe.  Discharge planning I would defer to primary service.  He has appointment to return back to the cancer center next week to resume chemotherapy. I will sign off.  Please call if questions arise  Heath Lark, MD 10/08/2017  8:26 AM

## 2017-10-08 NOTE — Progress Notes (Signed)
PROGRESS NOTE    [name]  GBT:[id number] DOB: Jan 01, 1961 DOA: 10/06/2017 PCP: Beaulah Corin, [facility]    Brief Narrative: [name] is a 56 y.o. male with medical history significant of LEFT tonsil cancer under the care of Dr Lebron Conners s/p one cycle of chemotherapy and taxol  Completed 5 days of chemo, with concurrent radiation therapy under Dr Isidore Moos, was seen in oncology office for another session of chemo, but was found ot have fever, generalized weakness, one week of diarrhea, associated with nausea and dry retching,.    Assessment & Plan:   Active Problems:   Squamous cell carcinoma of left tonsil (HCC)   Pancytopenia, acquired (HCC)   Neutropenic fever (HCC)   Hyponatremia   Anemia in other chronic diseases classified elsewhere   Malnutrition of moderate degree   Neutropenic fever:  Work up so far negative.  Blood cultures and urine cultures are pending have been negative so far. Influenza PCR is negative.  Rapid strep is negative, chest x-ray is negative for acute pneumonia. Patient's absolute count has improved.  Patient denies any hemoptysis. Continue with IV antibiotics for another 24 hours before transitioning to oral Augmentin to complete the course.  Continue gentle hydration.  Today patient reports feeling weak and tired and he reports he had one bowel movement with some blood in it so he would like to stay overnight and go home tomorrow if he feels okay.   Moderate protein calorie malnutrition Nutrition consulted Resume G-tube feeds and regular diet.   Hyponatremia probably from dehydration. Continue to monitor repeat sodium in the morning   Anemia of chronic disease Secondary to chemotherapy. Baseline hemoglobin around 13, dropped to 9.3 today.  Anemia PANEL will be sent.  Stool for occult blood ordered.  Anemia panel shows low iron levels.  Iron supplementation will be added on discharge.  Adequate folate and vitamin B12 levels.  Mild  thrombocytopenia Probably from chemo therapy   Squamous cell carcinoma of the left tonsil Further chemotherapy is on hold.  Outpatient follow-up with Dr.Gorsuch.    Rectal bleeding. / Acute anemia of blood loss Stool for occult blood pending No more episodes of blood per rectum since last night.  Monitor H&H.  Continue with PPI.  Transfuse to keep hemoglobin greater than 7.       DVT prophylaxis: scd's Code Status: Full code Family Communication: None at bedside Disposition Plan: home in am. Pt evaluation.    Consultants:   Oncology.    Procedures: none.    Antimicrobials: vancomycin and cefepime since admission.    Subjective: Reports feeling weak, tired, slightly dizzy.   Objective: Vitals:   10/07/17 1400 10/07/17 2112 10/08/17 0500 10/08/17 1410  BP: 101/62 (!) 98/57 104/60 104/61  Pulse: 65 66 62 (!) 57  Resp: 14 15 16 16   Temp: 98.6 F (37 C) 99 F (37.2 C) 98.9 F (37.2 C) 98.9 F (37.2 C)  TempSrc: Oral Oral Oral Oral  SpO2: 100% 100% 100% 100%  Weight:      Height:        Intake/Output Summary (Last 24 hours) at 10/08/2017 1557 Last data filed at 10/08/2017 0501 Gross per 24 hour  Intake 268.33 ml  Output 225 ml  Net 43.33 ml   Filed Weights   10/06/17 1657  Weight: 50.5 kg (111 lb 5.3 oz)    Examination:  General exam: Appears calm and comfortable not in distress.  Respiratory system: Clear to auscultation. Respiratory effort normal. No wheezing or  rhonchi.  Cardiovascular system: S1 & S2 heard, RRR. No JVD,No pedal edema. Gastrointestinal system: Abdomen is soft non tender non distended bowel sounds good.  G-tube in place Central nervous system: Alert and oriented. Non focal.  Extremities: Symmetric 5 x 5 power. Skin: No rashes, lesions or ulcers Psychiatry:  Mood & affect appropriate.     Data Reviewed: I have personally reviewed following labs and imaging studies  CBC: Recent Labs  Lab 10/06/17 1254 10/07/17 0500  10/08/17 0445  WBC 1.7* 3.1* 3.3*  NEUTROABS 1.1* 2.1 2.6  HGB 10.9* 9.3* 9.1*  HCT 33.1* 28.0* 27.2*  MCV 87.3 86.2 85.3  PLT 126* 113* 960*   Basic Metabolic Panel: Recent Labs  Lab 10/06/17 1254 10/07/17 0500 10/08/17 0445  NA 134* 132*  --   K 3.8 3.8  --   CL  --  99*  --   CO2 29 27  --   GLUCOSE 145* 113*  --   BUN 11.7 8  --   CREATININE 0.7 0.42* 0.41*  CALCIUM 9.6 8.6*  --    GFR: Estimated Creatinine Clearance: 74.5 mL/min (A) (by C-G formula based on SCr of 0.41 mg/dL (L)). Liver Function Tests: Recent Labs  Lab 10/06/17 1254 10/07/17 0500  AST 16 16  ALT 13 13*  ALKPHOS 82 59  BILITOT 0.66 0.8  PROT 7.2 6.1*  ALBUMIN 3.1* 2.8*   No results for input(s): LIPASE, AMYLASE in the last 168 hours. No results for input(s): AMMONIA in the last 168 hours. Coagulation Profile: No results for input(s): INR, PROTIME in the last 168 hours. Cardiac Enzymes: No results for input(s): CKTOTAL, CKMB, CKMBINDEX, TROPONINI in the last 168 hours. BNP (last 3 results) No results for input(s): PROBNP in the last 8760 hours. HbA1C: No results for input(s): HGBA1C in the last 72 hours. CBG: No results for input(s): GLUCAP in the last 168 hours. Lipid Profile: No results for input(s): CHOL, HDL, LDLCALC, TRIG, CHOLHDL, LDLDIRECT in the last 72 hours. Thyroid Function Tests: No results for input(s): TSH, T4TOTAL, FREET4, T3FREE, THYROIDAB in the last 72 hours. Anemia Panel: Recent Labs    10/07/17 0500 10/07/17 1037  VITAMINB12  --  586  FOLATE  --  18.5  FERRITIN  --  245  TIBC  --  223*  IRON  --  18*  RETICCTPCT 0.9  --    Sepsis Labs: No results for input(s): PROCALCITON, LATICACIDVEN in the last 168 hours.  Recent Results (from the past 240 hour(s))  Urine Culture     Status: None   Collection Time: 10/06/17  2:56 PM  Result Value Ref Range Status   Urine Culture, Routine Final report  Final   Organism ID, Bacteria No growth  Final  Culture, Blood      Status: None (Preliminary result)   Collection Time: 10/06/17  2:56 PM  Result Value Ref Range Status   BLOOD CULTURE, ROUTINE Preliminary report  Preliminary   Organism ID, Bacteria Comment  Preliminary    Comment: No growth in 36 - 48 hours.  Culture, Blood     Status: None (Preliminary result)   Collection Time: 10/06/17  3:31 PM  Result Value Ref Range Status   BLOOD CULTURE, ROUTINE Preliminary report  Preliminary   Organism ID, Bacteria Comment  Preliminary    Comment: No growth in 36 - 48 hours.  Rapid Strep Screen (Not at [facility])     Status: None   Collection Time: 10/06/17 10:44 PM  Result  Value Ref Range Status   Streptococcus, Group A Screen (Direct) NEGATIVE NEGATIVE Final    Comment: (NOTE) A Rapid Antigen test may result negative if the antigen level in the sample is below the detection level of this test. The FDA has not cleared this test as a stand-alone test therefore the rapid antigen negative result has reflexed to a Group A Strep culture.   Culture, group A strep     Status: None (Preliminary result)   Collection Time: 10/06/17 10:44 PM  Result Value Ref Range Status   Specimen Description THROAT  Final   Special Requests NONE Reflexed from [id number]  Final   Culture   Final    CULTURE REINCUBATED FOR BETTER GROWTH Performed at [facility], [address] [place], [place] [address]    Report Status PENDING  Incomplete         Radiology Studies: Dg Chest 2 View  Result Date: 10/06/2017 CLINICAL DATA:  Cough, weakness.  Fever. EXAM: CHEST  2 VIEW COMPARISON:  03/12/2015 FINDINGS: Right Port-A-Cath is in place with the tip at the cavoatrial junction. Mild hyperinflation. Heart and mediastinal contours are within normal limits. No focal opacities or effusions. No acute bony abnormality. IMPRESSION: Mild hyperinflation.  No active cardiopulmonary disease. Electronically Signed   By: Rolm Baptise M.D.   On: 10/06/2017 20:27        Scheduled Meds: .  feeding supplement (ENSURE ENLIVE)  237 mL Per Tube BID BM  . feeding supplement (OSMOLITE 1.5 CAL)  237 mL Per Tube 5 X Daily  . fluconazole  200 mg Oral Daily  . lidocaine-prilocaine   Topical Once  . pantoprazole (PROTONIX) IV  40 mg Intravenous Q24H   Continuous Infusions: . sodium chloride 100 mL/hr at 10/08/17 1523  . ceFEPime (MAXIPIME) IV 2 g (10/08/17 1538)  . vancomycin Stopped (10/08/17 0607)     LOS: 2 days    Time spent: 35 minutes.     Hosie Poisson, MD [facility] Pager [phone]   If 7PM-7AM, please contact night-coverage [email or web address] Password [email or web address] 10/08/2017, 3:57 PM

## 2017-10-09 LAB — CBC
HCT: 29.9 % — ABNORMAL LOW (ref 39.0–52.0)
Hemoglobin: 9.9 g/dL — ABNORMAL LOW (ref 13.0–17.0)
MCH: 28.6 pg (ref 26.0–34.0)
MCHC: 33.1 g/dL (ref 30.0–36.0)
MCV: 86.4 fL (ref 78.0–100.0)
PLATELETS: 130 10*3/uL — AB (ref 150–400)
RBC: 3.46 MIL/uL — AB (ref 4.22–5.81)
RDW: 13.4 % (ref 11.5–15.5)
WBC: 2.3 10*3/uL — AB (ref 4.0–10.5)

## 2017-10-09 LAB — CULTURE, GROUP A STREP (THRC)

## 2017-10-09 MED ORDER — AMOXICILLIN-POT CLAVULANATE 875-125 MG PO TABS: 1.0000 | ORAL_TABLET | Freq: Two times a day (BID) | ORAL | 0 refills | Status: AC

## 2017-10-09 MED ORDER — HEPARIN SOD (PORK) LOCK FLUSH 100 UNIT/ML IV SOLN
500.0000 [IU] | INTRAVENOUS | Status: AC | PRN
  Administered 2017-10-09: 500 [IU]

## 2017-10-09 NOTE — Evaluation (Signed)
Physical Therapy Evaluation & discharge Patient Details Name: [name] MRN: [id number] DOB: 1961-02-24 Today's Date: 10/09/2017   History of Present Illness  [name] is a 56 y.o. male with medical history significant of LEFT tonsil cancer under the care of Dr Lebron Conners s/p one cycle of chemotherapy and taxol  Completed 5 days of chemo, with concurrent radiation therapy under Dr Isidore Moos, was seen in oncology office for another session of chemo, but was found ot have fever, generalized weakness, one week of diarrhea, associated with nausea and dry retching,.   Clinical Impression  Pt at MOD I to I level at this time with all mobility.  He was able to ambulate long distances with no difficulty with head nods/turns, quick stops, or backwards gait.  Will sign off.  Please re-order if pt's status changes.    Follow Up Recommendations No PT follow up    Equipment Recommendations  None recommended by PT    Recommendations for Other Services       Precautions / Restrictions        Mobility  Bed Mobility Overal bed mobility: Independent                Transfers Overall transfer level: Independent                  Ambulation/Gait Ambulation/Gait assistance: Modified independent (Device/Increase time) Ambulation Distance (Feet): 500 Feet Assistive device: None Gait Pattern/deviations: Trunk flexed     General Gait Details: Trunk flexed and PT took IV pole as pt wanted to rely on it.  Pt still MOD I without it.  He was able to do head nods/turns, quick stops, backwards gait without difficulty.  Stairs            Wheelchair Mobility    Modified Rankin (Stroke Patients Only)       Balance Overall balance assessment: Independent                                           Pertinent Vitals/Pain Pain Assessment: No/denies pain    Home Living Family/patient expects to be discharged to:: Private residence Living Arrangements:  Non-relatives/Friends   Type of Home: House Home Access: Stairs to enter   Technical brewer of Steps: 2 Home Layout: One level Home Equipment: None      Prior Function Level of Independence: Independent               Hand Dominance        Extremity/Trunk Assessment   Upper Extremity Assessment Upper Extremity Assessment: Overall WFL for tasks assessed    Lower Extremity Assessment Lower Extremity Assessment: Overall WFL for tasks assessed    Cervical / Trunk Assessment Cervical / Trunk Assessment: Normal  Communication      Cognition Arousal/Alertness: Awake/alert Behavior During Therapy: WFL for tasks assessed/performed Overall Cognitive Status: Within Functional Limits for tasks assessed                                        General Comments      Exercises     Assessment/Plan    PT Assessment Patent does not need any further PT services  PT Problem List         PT Treatment Interventions  PT Goals (Current goals can be found in the Care Plan section)  Acute Rehab PT Goals Patient Stated Goal: go home PT Goal Formulation: All assessment and education complete, DC therapy    Frequency     Barriers to discharge        Co-evaluation               AM-PAC PT "6 Clicks" Daily Activity  Outcome Measure Difficulty turning over in bed (including adjusting bedclothes, sheets and blankets)?: None Difficulty moving from lying on back to sitting on the side of the bed? : None Difficulty sitting down on and standing up from a chair with arms (e.g., wheelchair, bedside commode, etc,.)?: None Help needed moving to and from a bed to chair (including a wheelchair)?: None Help needed walking in hospital room?: None Help needed climbing 3-5 steps with a railing? : None 6 Click Score: 24    End of Session   Activity Tolerance: Patient tolerated treatment well Patient left: in bed Nurse Communication: Mobility status PT  Visit Diagnosis: Muscle weakness (generalized) (M62.81)    Time: 0086-7619 PT Time Calculation (min) (ACUTE ONLY): 10 min   Charges:   PT Evaluation $PT Eval Low Complexity: 1 Low     PT G Codes:        Thurza Kwiecinski L. Tamala Julian, [place] Pager [phone] 10/09/2017   Galen Manila 10/09/2017, 9:51 AM

## 2017-10-09 NOTE — Progress Notes (Signed)
Pt leaving this morning.  Alert, oriented and without c/o. Discharge instructions given/explained with pt verbalizing understanding. Pt aware to followup with oncology.

## 2017-10-10 MED ORDER — FERROUS SULFATE 325 (65 FE) MG PO TABS: 325.0000 mg | ORAL_TABLET | Freq: Every day | ORAL | 0 refills | Status: DC

## 2017-10-10 NOTE — Discharge Summary (Signed)
Physician Discharge Summary  [name] CZY:[id number] DOB: 1961/08/05 DOA: 10/06/2017  PCP: Beaulah Corin, [facility]  Admit date: 10/06/2017 Discharge date: 10/09/2017  Admitted From: Home.  Disposition:  Home.   Recommendations for Outpatient Follow-up:  1. Follow up with PCP in 1-2 weeks 2. Please obtain BMP/CBC in one week Please follow up with oncology and radiation oncology as recommended.   Discharge Condition:stable.  CODE STATUS: full code.  Diet recommendation: Heart Healthy  Brief/Interim Summary: [name] a 56 y.o.malewith medical history significant ofLEFT tonsil cancer under the care of Dr [name] s/p one cycle of chemotherapy and taxol Completed 5 days of chemo, with concurrent radiation therapy under Dr [name], was seen in oncology office for another session of chemo, but was found ot have fever, generalized weakness, one week of diarrhea, associated with nausea and dry retching,. He was admitted for evaluation for febrile neutropenia.      Discharge Diagnoses:  Active Problems:   Squamous cell carcinoma of left tonsil (HCC)   Pancytopenia, acquired (HCC)   Neutropenic fever (HCC)   Hyponatremia   Anemia in other chronic diseases classified elsewhere   Malnutrition of moderate degree  Neutropenic fever:  Work up so far negative.  Blood cultures and urine cultures are pending have been negative so far. Influenza PCR is negative.  Rapid strep is negative, chest x-ray is negative for acute pneumonia. Patient's absolute count has improved.  Patient denies any hemoptysis. Discharged patient on oral augmentin to complete the course.    Moderate protein calorie malnutrition Nutrition consulted Resume G-tube feeds and regular diet.   Hyponatremia probably from dehydration. improved.    Anemia of chronic disease Secondary to chemotherapy. Baseline hemoglobin around 13, dropped to 9.3 today.  Anemia panel will be sent.  Stool for occult  blood ordered but couldn't be done.  Anemia panel shows low iron levels.  Iron supplementation will be added on discharge.  Adequate folate and vitamin B12 levels.  Mild thrombocytopenia Probably from chemo therapy   Squamous cell carcinoma of the left tonsil Further chemotherapy is on hold.  Outpatient follow-up with [name].    Rectal bleeding. / Acute anemia of blood loss Stool for occult blood pending, outpatient follow up.  No more episodes of blood per rectum since last night.  Monitor H&H.  Continue with PPI.          Discharge Instructions  Discharge Instructions    Diet - low sodium heart healthy   Complete by:  As directed    Discharge instructions   Complete by:  As directed    Follow up with oncology as recommended.     Allergies as of 10/09/2017   No Known Allergies     Medication List    STOP taking these medications   fluconazole 200 MG tablet Commonly known as:  DIFLUCAN   ibuprofen 200 MG tablet Commonly known as:  ADVIL,MOTRIN   nicotine polacrilex 4 MG lozenge Commonly known as:  NICOTINE MINI   ondansetron 8 MG tablet Commonly known as:  ZOFRAN   prochlorperazine 10 MG tablet Commonly known as:  COMPAZINE     TAKE these medications   acetaminophen 325 MG tablet Commonly known as:  TYLENOL Take 650 mg by mouth every 6 (six) hours as needed for moderate pain.   amoxicillin-clavulanate 875-125 MG tablet Commonly known as:  AUGMENTIN Take 1 tablet by mouth every 12 (twelve) hours for 5 days.   dexamethasone 4 MG tablet Commonly known as:  DECADRON  Take 2 tablets (8 mg total) daily by mouth. Start the day after chemotherapy for 2 days.   feeding supplement (OSMOLITE 1.5 CAL) Liqd Give 1 bottle Osmolite 1.5 via PEG 5 times daily with 60 mL free water before and after bolus. Drink or flush tube with additional 720 mL free water 3 times daily.   lidocaine 2 % solution Commonly known as:  XYLOCAINE Mix 1 part 2%viscous  lidocaine,1part H2O.Swish and/or swallow 50m of this mixture,355m before meals and at bedtime, up to QID   lidocaine-prilocaine cream Commonly known as:  EMLA Apply to affected area once   LORazepam 0.5 MG tablet Commonly known as:  ATIVAN Take 1 tablet (0.5 mg total) every 6 (six) hours as needed by mouth (Nausea or vomiting).   morphine CONCENTRATE 10 mg / 0.5 ml concentrated solution Place 0.5 mLs (10 mg total) into feeding tube every 4 (four) hours as needed for severe pain.   NICOTINEX PO Apply 1 patch topically.      Follow-up Information    [facility], [facility]. Schedule an appointment as soon as possible for a visit in 1 week(s).   Specialty:  Internal Medicine Contact information: [address] [address] [phone]        No Known Allergies  Consultations:  Oncology .   Procedures/Studies: Dg Chest 2 View  Result Date: 10/06/2017 CLINICAL DATA:  Cough, weakness.  Fever. EXAM: CHEST  2 VIEW COMPARISON:  03/12/2015 FINDINGS: Right Port-A-Cath is in place with the tip at the cavoatrial junction. Mild hyperinflation. Heart and mediastinal contours are within normal limits. No focal opacities or effusions. No acute bony abnormality. IMPRESSION: Mild hyperinflation.  No active cardiopulmonary disease. Electronically Signed   By: KeRolm Baptise.D.   On: 10/06/2017 20:27   Ir Gastrostomy Tube  Result Date: 09/13/2017 INDICATION: History of head and neck cancer. In need of gastrostomy tube placement for enteric nutrition supplementation while undergoing chemoradiation. EXAM: PULL TROUGH GASTROSTOMY TUBE PLACEMENT COMPARISON:  PET-CT - 08/16/2017 MEDICATIONS: Ancef 2 gm IV; Antibiotics were administered within 1 hour of the procedure. Glucagon 1 mg IV CONTRAST:  20 mL of Isovue 300 administered into the gastric lumen. ANESTHESIA/SEDATION: Moderate (conscious) sedation was employed during this procedure. A total of Versed 1 mg and Fentanyl 100 mcg was  administered intravenously. Moderate Sedation Time: 18 minutes. The patient's level of consciousness and vital signs were monitored continuously by radiology nursing throughout the procedure under my direct supervision. FLUOROSCOPY TIME:  2 minutes 6 seconds (7 mGy) COMPLICATIONS: None immediate. PROCEDURE: Informed written consent was obtained from the patient following explanation of the procedure, risks, benefits and alternatives. A time out was performed prior to the initiation of the procedure. Ultrasound scanning was performed to demarcate the edge of the left lobe of the liver. Maximal barrier sterile technique utilized including caps, mask, sterile gowns, sterile gloves, large sterile drape, hand hygiene and Betadine prep. The left upper quadrant was sterilely prepped and draped. An oral gastric catheter was inserted into the stomach under fluoroscopy. The existing nasogastric feeding tube was removed. The left costal margin and air/barium opacified transverse colon were identified and avoided. Air was injected into the stomach for insufflation and visualization under fluoroscopy. Under sterile conditions a 17 gauge trocar needle was utilized to access the stomach percutaneously beneath the left subcostal margin after the overlying soft tissues were anesthetized with 1% Lidocaine with epinephrine. Needle position was confirmed within the stomach with aspiration of air and injection of  small amount of contrast. A single T tack was deployed for gastropexy. Over an Amplatz guide wire, a 9-French sheath was inserted into the stomach. A snare device was utilized to capture the oral gastric catheter. The snare device was pulled retrograde from the stomach up the esophagus and out the oropharynx. The 20-French pull-through gastrostomy was connected to the snare device and pulled antegrade through the oropharynx down the esophagus into the stomach and then through the percutaneous tract external to the patient. The  gastrostomy was assembled externally. Contrast injection confirms position in the stomach. Several spot radiographic images were obtained in various obliquities for documentation. The patient tolerated procedure well without immediate post procedural complication. FINDINGS: After successful fluoroscopic guided placement, the gastrostomy tube is appropriately positioned with internal disc against the ventral aspect of the gastric lumen. IMPRESSION: Successful fluoroscopic insertion of a 20-French pull-through gastrostomy tube. The gastrostomy may be used immediately for medication administration and in 24 hrs for the initiation of feeds. Electronically Signed   By: Sandi Mariscal M.D.   On: 09/13/2017 15:05   Ir US Guide Vasc Access Right  Result Date: 09/13/2017 INDICATION: History of head neck cancer. In need of durable intravenous access for chemotherapy administration. EXAM: IMPLANTED PORT A CATH PLACEMENT WITH ULTRASOUND AND FLUOROSCOPIC GUIDANCE COMPARISON:  PET-CT - 08/16/2017 MEDICATIONS: Ancef 2 gm IV; The antibiotic was administered within an appropriate time interval prior to skin puncture. ANESTHESIA/SEDATION: Moderate (conscious) sedation was employed during this procedure. A total of Versed 3 mg and Fentanyl 100 mcg was administered intravenously. Moderate Sedation Time: 26 minutes. The patient's level of consciousness and vital signs were monitored continuously by radiology nursing throughout the procedure under my direct supervision. CONTRAST:  None FLUOROSCOPY TIME:  24 seconds (2 mGy) COMPLICATIONS: None immediate. PROCEDURE: The procedure, risks, benefits, and alternatives were explained to the patient. Questions regarding the procedure were encouraged and answered. The patient understands and consents to the procedure. The right neck and chest were prepped with chlorhexidine in a sterile fashion, and a sterile drape was applied covering the operative field. Maximum barrier sterile technique with  sterile gowns and gloves were used for the procedure. A timeout was performed prior to the initiation of the procedure. Local anesthesia was provided with 1% lidocaine with epinephrine. After creating a small venotomy incision, a micropuncture kit was utilized to access the internal jugular vein. Real-time ultrasound guidance was utilized for vascular access including the acquisition of a permanent ultrasound image documenting patency of the accessed vessel. The microwire was utilized to measure appropriate catheter length. A subcutaneous port pocket was then created along the upper chest wall utilizing a combination of sharp and blunt dissection. The pocket was irrigated with sterile saline. A single lumen thin power injectable port was chosen for placement. The 8 Fr catheter was tunneled from the port pocket site to the venotomy incision. The port was placed in the pocket. The external catheter was trimmed to appropriate length. At the venotomy, an 8 Fr peel-away sheath was placed over a guidewire under fluoroscopic guidance. The catheter was then placed through the sheath and the sheath was removed. Final catheter positioning was confirmed and documented with a fluoroscopic spot radiograph. The port was accessed with a Huber needle, aspirated and flushed with heparinized saline. The venotomy site was closed with an interrupted 4-0 Vicryl suture. The port pocket incision was closed with interrupted 2-0 Vicryl suture and the skin was opposed with a running subcuticular 4-0 Vicryl suture. Dermabond and Steri-strips were  applied to both incisions. Dressings were placed. The patient tolerated the procedure well without immediate post procedural complication. FINDINGS: After catheter placement, the tip lies within the superior cavoatrial junction. The catheter aspirates and flushes normally and is ready for immediate use. IMPRESSION: Successful placement of a right internal jugular approach power injectable  Port-A-Cath. The catheter is ready for immediate use. Electronically Signed   By: Sandi Mariscal M.D.   On: 09/13/2017 15:00   Ir Fluoro Guide Port Insertion Right  Result Date: 09/13/2017 INDICATION: History of head neck cancer. In need of durable intravenous access for chemotherapy administration. EXAM: IMPLANTED PORT A CATH PLACEMENT WITH ULTRASOUND AND FLUOROSCOPIC GUIDANCE COMPARISON:  PET-CT - 08/16/2017 MEDICATIONS: Ancef 2 gm IV; The antibiotic was administered within an appropriate time interval prior to skin puncture. ANESTHESIA/SEDATION: Moderate (conscious) sedation was employed during this procedure. A total of Versed 3 mg and Fentanyl 100 mcg was administered intravenously. Moderate Sedation Time: 26 minutes. The patient's level of consciousness and vital signs were monitored continuously by radiology nursing throughout the procedure under my direct supervision. CONTRAST:  None FLUOROSCOPY TIME:  24 seconds (2 mGy) COMPLICATIONS: None immediate. PROCEDURE: The procedure, risks, benefits, and alternatives were explained to the patient. Questions regarding the procedure were encouraged and answered. The patient understands and consents to the procedure. The right neck and chest were prepped with chlorhexidine in a sterile fashion, and a sterile drape was applied covering the operative field. Maximum barrier sterile technique with sterile gowns and gloves were used for the procedure. A timeout was performed prior to the initiation of the procedure. Local anesthesia was provided with 1% lidocaine with epinephrine. After creating a small venotomy incision, a micropuncture kit was utilized to access the internal jugular vein. Real-time ultrasound guidance was utilized for vascular access including the acquisition of a permanent ultrasound image documenting patency of the accessed vessel. The microwire was utilized to measure appropriate catheter length. A subcutaneous port pocket was then created along the  upper chest wall utilizing a combination of sharp and blunt dissection. The pocket was irrigated with sterile saline. A single lumen thin power injectable port was chosen for placement. The 8 Fr catheter was tunneled from the port pocket site to the venotomy incision. The port was placed in the pocket. The external catheter was trimmed to appropriate length. At the venotomy, an 8 Fr peel-away sheath was placed over a guidewire under fluoroscopic guidance. The catheter was then placed through the sheath and the sheath was removed. Final catheter positioning was confirmed and documented with a fluoroscopic spot radiograph. The port was accessed with a Huber needle, aspirated and flushed with heparinized saline. The venotomy site was closed with an interrupted 4-0 Vicryl suture. The port pocket incision was closed with interrupted 2-0 Vicryl suture and the skin was opposed with a running subcuticular 4-0 Vicryl suture. Dermabond and Steri-strips were applied to both incisions. Dressings were placed. The patient tolerated the procedure well without immediate post procedural complication. FINDINGS: After catheter placement, the tip lies within the superior cavoatrial junction. The catheter aspirates and flushes normally and is ready for immediate use. IMPRESSION: Successful placement of a right internal jugular approach power injectable Port-A-Cath. The catheter is ready for immediate use. Electronically Signed   By: Sandi Mariscal M.D.   On: 09/13/2017 15:00     Subjective: No new complaints.   Discharge Exam: Vitals:   10/08/17 2029 10/09/17 0602  BP: 101/66 (!) 101/56  Pulse: 99 88  Resp: 16 17  Temp: 98.4 F (36.9 C) 98.1 F (36.7 C)  SpO2: 97% 100%   Vitals:   10/08/17 0500 10/08/17 1410 10/08/17 2029 10/09/17 0602  BP: 104/60 104/61 101/66 (!) 101/56  Pulse: 62 (!) 57 99 88  Resp: '16 16 16 17  '$ Temp: 98.9 F (37.2 C) 98.9 F (37.2 C) 98.4 F (36.9 C) 98.1 F (36.7 C)  TempSrc: Oral Oral Oral  Oral  SpO2: 100% 100% 97% 100%  Weight:      Height:        General: Pt is alert, awake, not in acute distress Cardiovascular: RRR, S1/S2 +, no rubs, no gallops Respiratory: CTA bilaterally, no wheezing, no rhonchi Abdominal: Soft, NT, ND, bowel sounds + Extremities: no edema, no cyanosis    The results of significant diagnostics from this hospitalization (including imaging, microbiology, ancillary and laboratory) are listed below for reference.     Microbiology: Recent Results (from the past 240 hour(s))  Urine Culture     Status: None   Collection Time: 10/06/17  2:56 PM  Result Value Ref Range Status   Urine Culture, Routine Final report  Final   Organism ID, Bacteria No growth  Final  Culture, Blood     Status: None (Preliminary result)   Collection Time: 10/06/17  2:56 PM  Result Value Ref Range Status   BLOOD CULTURE, ROUTINE Preliminary report  Preliminary   Organism ID, Bacteria Comment  Preliminary    Comment: No growth in 36 - 48 hours.  Culture, Blood     Status: None (Preliminary result)   Collection Time: 10/06/17  3:31 PM  Result Value Ref Range Status   BLOOD CULTURE, ROUTINE Preliminary report  Preliminary   Organism ID, Bacteria Comment  Preliminary    Comment: No growth in 36 - 48 hours.  Rapid Strep Screen (Not at [facility])     Status: None   Collection Time: 10/06/17 10:44 PM  Result Value Ref Range Status   Streptococcus, Group A Screen (Direct) NEGATIVE NEGATIVE Final    Comment: (NOTE) A Rapid Antigen test may result negative if the antigen level in the sample is below the detection level of this test. The FDA has not cleared this test as a stand-alone test therefore the rapid antigen negative result has reflexed to a Group A Strep culture.   Culture, group A strep     Status: None   Collection Time: 10/06/17 10:44 PM  Result Value Ref Range Status   Specimen Description THROAT  Final   Special Requests NONE Reflexed from [id number]  Final   Culture    Final    NO GROUP A STREP (S.PYOGENES) ISOLATED Performed at [facility], [address], [place],  [address]    Report Status 10/09/2017 FINAL  Final     Labs: BNP (last 3 results) No results for input(s): BNP in the last 8760 hours. Basic Metabolic Panel: Recent Labs  Lab 10/06/17 1254 10/07/17 0500 10/08/17 0445  NA 134* 132*  --   K 3.8 3.8  --   CL  --  99*  --   CO2 29 27  --   GLUCOSE 145* 113*  --   BUN 11.7 8  --   CREATININE 0.7 0.42* 0.41*  CALCIUM 9.6 8.6*  --    Liver Function Tests: Recent Labs  Lab 10/06/17 1254 10/07/17 0500  AST 16 16  ALT 13 13*  ALKPHOS 82 59  BILITOT 0.66 0.8  PROT 7.2 6.1*  ALBUMIN  3.1* 2.8*   No results for input(s): LIPASE, AMYLASE in the last 168 hours. No results for input(s): AMMONIA in the last 168 hours. CBC: Recent Labs  Lab 10/06/17 1254 10/07/17 0500 10/08/17 0445 10/09/17 0345  WBC 1.7* 3.1* 3.3* 2.3*  NEUTROABS 1.1* 2.1 2.6  --   HGB 10.9* 9.3* 9.1* 9.9*  HCT 33.1* 28.0* 27.2* 29.9*  MCV 87.3 86.2 85.3 86.4  PLT 126* 113* 110* 130*   Cardiac Enzymes: No results for input(s): CKTOTAL, CKMB, CKMBINDEX, TROPONINI in the last 168 hours. BNP: Invalid input(s): POCBNP CBG: No results for input(s): GLUCAP in the last 168 hours. D-Dimer No results for input(s): DDIMER in the last 72 hours. Hgb A1c No results for input(s): HGBA1C in the last 72 hours. Lipid Profile No results for input(s): CHOL, HDL, LDLCALC, TRIG, CHOLHDL, LDLDIRECT in the last 72 hours. Thyroid function studies No results for input(s): TSH, T4TOTAL, T3FREE, THYROIDAB in the last 72 hours.  Invalid input(s): FREET3 Anemia work up Recent Labs    10/07/17 0500 10/07/17 1037  VITAMINB12  --  586  FOLATE  --  18.5  FERRITIN  --  245  TIBC  --  223*  IRON  --  18*  RETICCTPCT 0.9  --    Urinalysis    Component Value Date/Time   COLORURINE YELLOW 05/14/2011 1307   APPEARANCEUR CLEAR 05/14/2011 1307   LABSPEC 1.020  10/06/2017 1456   PHURINE 6.0 10/06/2017 1456   PHURINE 6.5 05/14/2011 1307   GLUCOSEU Negative 10/06/2017 1456   HGBUR Negative 10/06/2017 1456   HGBUR SMALL (A) 05/14/2011 1307   BILIRUBINUR Negative 10/06/2017 1456   KETONESUR Negative 10/06/2017 1456   KETONESUR NEGATIVE 05/14/2011 1307   PROTEINUR Negative 10/06/2017 1456   PROTEINUR NEGATIVE 05/14/2011 1307   UROBILINOGEN 0.2 10/06/2017 1456   NITRITE Negative 10/06/2017 1456   NITRITE NEGATIVE 05/14/2011 1307   LEUKOCYTESUR Negative 10/06/2017 1456   Sepsis Labs Invalid input(s): PROCALCITONIN,  WBC,  LACTICIDVEN Microbiology Recent Results (from the past 240 hour(s))  Urine Culture     Status: None   Collection Time: 10/06/17  2:56 PM  Result Value Ref Range Status   Urine Culture, Routine Final report  Final   Organism ID, Bacteria No growth  Final  Culture, Blood     Status: None (Preliminary result)   Collection Time: 10/06/17  2:56 PM  Result Value Ref Range Status   BLOOD CULTURE, ROUTINE Preliminary report  Preliminary   Organism ID, Bacteria Comment  Preliminary    Comment: No growth in 36 - 48 hours.  Culture, Blood     Status: None (Preliminary result)   Collection Time: 10/06/17  3:31 PM  Result Value Ref Range Status   BLOOD CULTURE, ROUTINE Preliminary report  Preliminary   Organism ID, Bacteria Comment  Preliminary    Comment: No growth in 36 - 48 hours.  Rapid Strep Screen (Not at [facility])     Status: None   Collection Time: 10/06/17 10:44 PM  Result Value Ref Range Status   Streptococcus, Group A Screen (Direct) NEGATIVE NEGATIVE Final    Comment: (NOTE) A Rapid Antigen test may result negative if the antigen level in the sample is below the detection level of this test. The FDA has not cleared this test as a stand-alone test therefore the rapid antigen negative result has reflexed to a Group A Strep culture.   Culture, group A strep     Status: None   Collection Time: 10/06/17  10:44 PM  Result  Value Ref Range Status   Specimen Description THROAT  Final   Special Requests NONE Reflexed from [id number]  Final   Culture   Final    NO GROUP A STREP (S.PYOGENES) ISOLATED Performed at [facility], [address], [place], [place] [address]    Report Status 10/09/2017 FINAL  Final     Time coordinating discharge: Over 30 minutes  SIGNED:   Hosie Poisson, MD  [facility] 10/10/2017, 12:01 AM Pager   If 7PM-7AM, please contact night-coverage [email or web address] Password [email or web address]

## 2017-10-11 ENCOUNTER — Ambulatory Visit
Admission: RE | Admit: 2017-10-11 | Discharge: 2017-10-11 | Disposition: A | Discharge status: Home / Self Care | Payer: Medicaid Other | Source: Ambulatory Visit | Attending: Radiation Oncology | Admitting: Radiation Oncology

## 2017-10-11 DIAGNOSIS — F1010 Alcohol abuse, uncomplicated: Secondary | ICD-10-CM | POA: Diagnosis not present

## 2017-10-11 DIAGNOSIS — C099 Malignant neoplasm of tonsil, unspecified: Secondary | ICD-10-CM | POA: Diagnosis not present

## 2017-10-11 DIAGNOSIS — F17210 Nicotine dependence, cigarettes, uncomplicated: Secondary | ICD-10-CM | POA: Diagnosis not present

## 2017-10-11 DIAGNOSIS — Z510 Encounter for antineoplastic radiation therapy: Secondary | ICD-10-CM | POA: Diagnosis present

## 2017-10-11 DIAGNOSIS — R1310 Dysphagia, unspecified: Secondary | ICD-10-CM | POA: Diagnosis not present

## 2017-10-12 LAB — CULTURE, BLOOD (SINGLE)

## 2017-10-13 ENCOUNTER — Ambulatory Visit
Admission: RE | Admit: 2017-10-13 | Discharge: 2017-10-13 | Disposition: A | Discharge status: Home / Self Care | Payer: Medicaid Other | Source: Ambulatory Visit | Attending: Radiation Oncology | Admitting: Radiation Oncology

## 2017-10-13 ENCOUNTER — Other Ambulatory Visit (HOSPITAL_BASED_OUTPATIENT_CLINIC_OR_DEPARTMENT_OTHER): Payer: Medicaid Other

## 2017-10-13 DIAGNOSIS — Z510 Encounter for antineoplastic radiation therapy: Secondary | ICD-10-CM | POA: Diagnosis not present

## 2017-10-13 DIAGNOSIS — C099 Malignant neoplasm of tonsil, unspecified: Secondary | ICD-10-CM

## 2017-10-13 LAB — CBC WITH DIFFERENTIAL/PLATELET
BASO%: 0.4 % (ref 0.0–2.0)
Basophils Absolute: 0 10*3/uL (ref 0.0–0.1)
EOS ABS: 0 10*3/uL (ref 0.0–0.5)
EOS%: 0.9 % (ref 0.0–7.0)
HCT: 32.4 % — ABNORMAL LOW (ref 38.4–49.9)
HGB: 10.6 g/dL — ABNORMAL LOW (ref 13.0–17.1)
LYMPH%: 20.1 % (ref 14.0–49.0)
MCH: 28.6 pg (ref 27.2–33.4)
MCHC: 32.7 g/dL (ref 32.0–36.0)
MCV: 87.3 fL (ref 79.3–98.0)
MONO#: 0.6 10*3/uL (ref 0.1–0.9)
MONO%: 27.2 % — AB (ref 0.0–14.0)
NEUT#: 1.2 10*3/uL — ABNORMAL LOW (ref 1.5–6.5)
NEUT%: 51.4 % (ref 39.0–75.0)
PLATELETS: 136 10*3/uL — AB (ref 140–400)
RBC: 3.71 10*6/uL — AB (ref 4.20–5.82)
RDW: 13.5 % (ref 11.0–14.6)
WBC: 2.2 10*3/uL — ABNORMAL LOW (ref 4.0–10.3)
lymph#: 0.5 10*3/uL — ABNORMAL LOW (ref 0.9–3.3)

## 2017-10-13 LAB — COMPREHENSIVE METABOLIC PANEL
ALT: 26 U/L (ref 0–55)
ANION GAP: 9 meq/L (ref 3–11)
AST: 29 U/L (ref 5–34)
Albumin: 3.3 g/dL — ABNORMAL LOW (ref 3.5–5.0)
Alkaline Phosphatase: 79 U/L (ref 40–150)
BUN: 12.1 mg/dL (ref 7.0–26.0)
CHLORIDE: 96 meq/L — AB (ref 98–109)
CO2: 31 meq/L — AB (ref 22–29)
Calcium: 9.8 mg/dL (ref 8.4–10.4)
Creatinine: 0.7 mg/dL (ref 0.7–1.3)
Glucose: 96 mg/dl (ref 70–140)
Potassium: 4.5 mEq/L (ref 3.5–5.1)
Sodium: 136 mEq/L (ref 136–145)
Total Bilirubin: <0.22 mg/dL (ref 0.20–1.20)
Total Protein: 7.2 g/dL (ref 6.4–8.3)

## 2017-10-13 NOTE — Progress Notes (Signed)
These preliminary result these preliminary results were noted.  Awaiting final report.

## 2017-10-14 ENCOUNTER — Other Ambulatory Visit: Payer: Self-pay

## 2017-10-14 ENCOUNTER — Ambulatory Visit: Payer: Medicaid Other | Admitting: Nutrition

## 2017-10-14 ENCOUNTER — Ambulatory Visit (HOSPITAL_BASED_OUTPATIENT_CLINIC_OR_DEPARTMENT_OTHER): Payer: Medicaid Other

## 2017-10-14 ENCOUNTER — Ambulatory Visit
Admission: RE | Admit: 2017-10-14 | Discharge: 2017-10-14 | Disposition: A | Discharge status: Home / Self Care | Payer: Medicaid Other | Source: Ambulatory Visit | Attending: Radiation Oncology | Admitting: Radiation Oncology

## 2017-10-14 ENCOUNTER — Other Ambulatory Visit: Payer: Self-pay | Admitting: Medical

## 2017-10-14 VITALS — BP 96/62 | HR 70 | Temp 99.3°F | Resp 16

## 2017-10-14 DIAGNOSIS — Z5111 Encounter for antineoplastic chemotherapy: Secondary | ICD-10-CM | POA: Diagnosis present

## 2017-10-14 DIAGNOSIS — C099 Malignant neoplasm of tonsil, unspecified: Secondary | ICD-10-CM

## 2017-10-14 DIAGNOSIS — Z510 Encounter for antineoplastic radiation therapy: Secondary | ICD-10-CM | POA: Diagnosis not present

## 2017-10-14 MED ORDER — PACLITAXEL CHEMO INJECTION 300 MG/50ML
45.0000 mg/m2 | Freq: Once | INTRAVENOUS | Status: AC
  Administered 2017-10-14: 78 mg via INTRAVENOUS
  Filled 2017-10-14: qty 13

## 2017-10-14 MED ORDER — DIPHENHYDRAMINE HCL 50 MG/ML IJ SOLN
INTRAMUSCULAR | Status: AC
  Filled 2017-10-14: qty 1

## 2017-10-14 MED ORDER — FAMOTIDINE IN NACL 20-0.9 MG/50ML-% IV SOLN
20.0000 mg | Freq: Once | INTRAVENOUS | Status: AC
  Administered 2017-10-14: 20 mg via INTRAVENOUS

## 2017-10-14 MED ORDER — SODIUM CHLORIDE 0.9 % IV SOLN
200.0000 mg | Freq: Once | INTRAVENOUS | Status: AC
  Administered 2017-10-14: 200 mg via INTRAVENOUS
  Filled 2017-10-14: qty 20

## 2017-10-14 MED ORDER — ACETAMINOPHEN 325 MG PO TABS
ORAL_TABLET | ORAL | Status: AC
  Filled 2017-10-14: qty 2

## 2017-10-14 MED ORDER — SODIUM CHLORIDE 0.9 % IV SOLN
Freq: Once | INTRAVENOUS | Status: AC
  Administered 2017-10-14: 15:00:00 via INTRAVENOUS

## 2017-10-14 MED ORDER — FAMOTIDINE IN NACL 20-0.9 MG/50ML-% IV SOLN
INTRAVENOUS | Status: AC
  Filled 2017-10-14: qty 50

## 2017-10-14 MED ORDER — DIPHENHYDRAMINE HCL 50 MG/ML IJ SOLN
50.0000 mg | Freq: Once | INTRAMUSCULAR | Status: AC
  Administered 2017-10-14: 50 mg via INTRAVENOUS

## 2017-10-14 MED ORDER — OXYCODONE HCL 5 MG PO TABS: ORAL_TABLET | ORAL | 0 refills | Status: DC

## 2017-10-14 MED ORDER — HEPARIN SOD (PORK) LOCK FLUSH 100 UNIT/ML IV SOLN
500.0000 [IU] | Freq: Once | INTRAVENOUS | Status: AC | PRN
  Administered 2017-10-14: 500 [IU]
  Filled 2017-10-14: qty 5

## 2017-10-14 MED ORDER — SODIUM CHLORIDE 0.9 % IV SOLN
20.0000 mg | Freq: Once | INTRAVENOUS | Status: AC
  Administered 2017-10-14: 20 mg via INTRAVENOUS
  Filled 2017-10-14: qty 2

## 2017-10-14 MED ORDER — PALONOSETRON HCL INJECTION 0.25 MG/5ML
INTRAVENOUS | Status: AC
  Filled 2017-10-14: qty 5

## 2017-10-14 MED ORDER — SODIUM CHLORIDE 0.9% FLUSH
10.0000 mL | INTRAVENOUS | Status: DC | PRN
  Administered 2017-10-14: 10 mL
  Filled 2017-10-14: qty 10

## 2017-10-14 MED ORDER — PALONOSETRON HCL INJECTION 0.25 MG/5ML
0.2500 mg | Freq: Once | INTRAVENOUS | Status: AC
  Administered 2017-10-14: 0.25 mg via INTRAVENOUS

## 2017-10-14 MED ORDER — ACETAMINOPHEN 325 MG PO TABS
650.0000 mg | ORAL_TABLET | Freq: Once | ORAL | Status: AC
Start: 2017-10-14 — End: 2017-10-14
  Administered 2017-10-14: 650 mg via ORAL

## 2017-10-14 NOTE — Patient Instructions (Signed)
[facility] Discharge Instructions for Patients Receiving Chemotherapy  Today you received the following chemotherapy agents: Paclitaxel (Taxol) and Carboplatin (Paraplatin).  To help prevent nausea and vomiting after your treatment, we encourage you to take your nausea medication as prescribed.  If you develop nausea and vomiting that is not controlled by your nausea medication, call the clinic.   BELOW ARE SYMPTOMS THAT SHOULD BE REPORTED IMMEDIATELY:  *FEVER GREATER THAN 100.5 F  *CHILLS WITH OR WITHOUT FEVER  NAUSEA AND VOMITING THAT IS NOT CONTROLLED WITH YOUR NAUSEA MEDICATION  *UNUSUAL SHORTNESS OF BREATH  *UNUSUAL BRUISING OR BLEEDING  TENDERNESS IN MOUTH AND THROAT WITH OR WITHOUT PRESENCE OF ULCERS  *URINARY PROBLEMS  *BOWEL PROBLEMS  UNUSUAL RASH Items with * indicate a potential emergency and should be followed up as soon as possible.  Feel free to call the clinic should you have any questions or concerns. The clinic phone number is [phone].  Please show the [facility] at check-in to the Emergency Department and triage nurse.

## 2017-10-14 NOTE — Progress Notes (Signed)
Nutrition follow-up completed with patient receiving chemoradiation therapy for tonsil cancer. Weight improved documented as 116.8 pounds December 27 increased from 111.3 pounds December 19.  Albumin improved to 3.3. Patient reports he has had nausea but nausea medication helps. He denies problems with constipation. Patient reports he is using Osmolite 1.5 or Ensure Plus via PEG.  It is difficult to know how many bottles patient is doing daily but reports around 6 bottles Patient is flushing his feeding tube with water. Reports painful swallowing and is not eating or drinking by mouth.  Estimated nutrition needs: 1745-1945 calories, 75-90 grams protein, 2.0 L fluid.  Nutrition diagnosis: Severe malnutrition.  Continues.  Intervention: Educated patient to continue swallowing water or other liquids as tolerated. Encouraged patient to take medications as prescribed. Reviewed method of tube feeding with patient and encouraged 2 bottles, 3 timesdaily at a minimum. Provided samples of Osmolite 1.5. Questions were answered.  Teach back method used.  Monitoring, evaluation, goals: Patient will tolerate increased calories and protein to promote weight gain.  Next visit: Thursday, January 3 after radiation therapy.  **Disclaimer: This note was dictated with voice recognition software. Similar sounding words can inadvertently be transcribed and this note may contain transcription errors which may not have been corrected upon publication of note.**

## 2017-10-15 ENCOUNTER — Ambulatory Visit
Admission: RE | Admit: 2017-10-15 | Discharge: 2017-10-15 | Disposition: A | Discharge status: Home / Self Care | Payer: Medicaid Other | Source: Ambulatory Visit | Attending: Radiation Oncology | Admitting: Radiation Oncology

## 2017-10-15 DIAGNOSIS — Z510 Encounter for antineoplastic radiation therapy: Secondary | ICD-10-CM | POA: Diagnosis not present

## 2017-10-18 ENCOUNTER — Ambulatory Visit
Admission: RE | Admit: 2017-10-18 | Discharge: 2017-10-18 | Disposition: A | Discharge status: Home / Self Care | Payer: Medicaid Other | Source: Ambulatory Visit | Attending: Radiation Oncology | Admitting: Radiation Oncology

## 2017-10-18 DIAGNOSIS — Z510 Encounter for antineoplastic radiation therapy: Secondary | ICD-10-CM | POA: Diagnosis not present

## 2017-10-18 NOTE — Progress Notes (Signed)
Patient not present for the appointment scheduled.

## 2017-10-19 NOTE — Assessment & Plan Note (Signed)
57 y.o. with left tonsillar carcinoma.  Imaging demonstrates locally advanced disease without evidence of metastatic disease in the lymph nodes by PET/CT.  No evidence of distal metastasis. Patient underwent dental evaluation and surgery due to poor dentition.    Subsequently, patient was started on systemic chemotherapy with weekly carboplatin and paclitaxel administered concurrently with radiation.  Tolerating treatment well so far without unexpected or intolerable toxicity. Clinical evaluation lobar today are permissive to proceed with the third week of systemic therapy.   Plan:  --Proceed with the 3rd week of systemic chemotherapy --Return to my clinic in 1 week for labs, toxicity monitoring, and continuation of systemic therapy

## 2017-10-19 NOTE — Progress Notes (Signed)
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 with left tonsillar carcinoma.  Imaging demonstrates locally advanced disease without evidence of metastatic disease in the lymph nodes by PET/CT.  No evidence of distal metastasis. Patient underwent dental evaluation and surgery due to poor dentition.    Subsequently, patient was started on systemic chemotherapy with weekly carboplatin and paclitaxel administered concurrently with radiation.  Tolerating treatment well so far without unexpected or intolerable toxicity. Clinical evaluation lobar today are permissive to proceed with the third week of systemic therapy.   Plan:  --Proceed with the 3rd week of systemic chemotherapy --Return to my clinic in 1 week for labs, toxicity monitoring, and continuation of systemic therapy  Voice recognition software was used and creation of this note. Despite my best effort at editing the text, some misspelling/errors may have occurred.  No orders of the defined types were placed in this encounter.   Cancer Staging Squamous cell carcinoma of left tonsil (HCC) Staging form: Pharynx - P16 Negative Oropharynx, AJCC 8th Edition - Clinical stage from 08/06/2017: Stage II (cT2, cN0, cM0, p16: Negative) - Signed by Ardath Sax, MD on 09/01/2017   All questions were answered. . The patient knows to call the clinic with any problems, questions or concerns.  This note was electronically signed.    History of Presenting Illness [name] is a 57 y.o. male followed in the [place] for diagnosis of squamous cell carcinoma of the left tonsil. Patient initially presented with sore throat onset around the middle of June. He first presented to the Urgent Care on 04/26/17 after 3 weeks of symptoms. The sore throat was also accompanied by the globus sensation in the neck. He was treated for uvulitis initially, wihtout significant  improvement despite two additional visits to the Urgent Care. On his last visit with the Urgent CareENT referral was made.  The patient saw Dr. Janace Hoard from [facility] and underwent laryngoscopy on 07/16/17 with biopsy which confirmed presence of squamous cell carcinoma. Please see results of additional evaluation in the oncological history below. At the present time, additional assessment is pending. Patient was seen by dentistry and multiple dental extractions were recommended. He is scheduled for surgical removal of his teeth on 08/09/17.   Current symptoms include no fevers, chills, night sweats. 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 Denies chest pain, shortness of breath, or cough. No nausea, vomiting, abdominal pain, diarrhea, or constipation. No urinary or neurological symptoms.  Patient is an active tobacco user, smoking at least 1ppd x 30 years. In addition, patient is habitual daily alcohol userdrinking as much as 1 beer (a 40oz) and 1 pint of gin per day. Patient has been treated for alcohol dependency in the past. His own presentation trace some degree of denial/attempts at minimizing his alcohol dependency as initially he only mentioned drinking 1 beer per day. His family has corrected him, and he acknowledged a higher level of alcohol use. In the past, he has stopped drinking, but he does get withdrawal symptoms rapidly.  Patient returns to the clinic to continue systemic therapy with carboplatin and paclitaxel administered concurrently with radiation therapy that is also scheduled to start today. Patient placement of Peg tube as well Infusaport last visit to the clinic. Reports appropriate soreness following the procedures which occurred two days ago. Denies any other new complaints. No progressive neuropathy.  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 There are foci suspicious but not definitive for invasion. p16 immunohistochemistry is negative in the neoplastic cells. FINAL DIAGNOSIS       07/27/2017 Imaging    CT neck: Indistinct tonsillar enlargement on the left in the region of left tonsillar mass biopsy. This could be a combination of post biopsy edema and residual mass. Margins are not discrete and accurate measurement cannot be performed. This is estimated at about 3 cm in size. No evidence of metastatic adenopathy.      08/16/2017 Imaging    PET-CT: Hypermetabolic disease in the left palatal fossa.  Indeterminate level 3 lymph node which may be reactive considering recent dental extractions.      09/01/2017 -  Radiation Therapy         09/01/2017 -  Chemotherapy    Carbboplatin AUC2,d1 + Paclitaxel '45mg'$ /m2, d1 Q7d --Week #1, 62/95/28: Complicated by some burning/soreness in the throat. --Week #2, 09/08/17: --Week #3, 09/15/17:      09/13/2017 Procedure    Successful fluoroscopic insertion of a 20-French pull-through gastrostomy tube. Successful placement of a right internal jugular approach power injectable Port-A-Cath.       Medical History: Past Medical History:  Diagnosis Date  . Cancer [facility])    left tonsil cancer    Surgical History: Past Surgical History:  Procedure Laterality Date  . IR FLUORO GUIDE PORT INSERTION RIGHT  09/13/2017  . IR GASTROSTOMY TUBE MOD SED  09/13/2017  . IR US GUIDE VASC ACCESS RIGHT  09/13/2017  . MULTIPLE EXTRACTIONS WITH ALVEOLOPLASTY N/A 08/09/2017   Procedure: Extraction of tooth #'s 1-6, 11-17,21,22,26-30 and 32 with alveoloplasty and bilateral mandibular tori reductions;  Surgeon: Lenn Cal, DDS;  Location: [facility];  Service: Oral Surgery;  Laterality: N/A;  . tonsil biopsy      left tonsil    Family History: Family History  Problem Relation Age of Onset  . Diabetes Mother     Social History: Social History   Socioeconomic History  . Marital status: Single    Spouse name: Not on file  . Number of children: 1  . Years of education: Not on file  . Highest education level: Not on file  Social Needs  . Financial resource strain: Not on file  . Food insecurity - worry: Not on file  . Food insecurity - inability: Not on file  . Transportation needs - medical: Not on file  . Transportation needs - non-medical: Not on file  Occupational History  . Occupation: Disabled  Tobacco Use  . Smoking status: Current Every Day Smoker    Packs/day: 1.00    Years: 30.00    Pack years: 30.00  . Smokeless tobacco: Never Used  Substance and Sexual Activity  . Alcohol use: Yes    Alcohol/week: 4.2 oz    Types: 7 Cans of beer per week    Comment: 1 pint daily   . Drug use: No    Comment: he has a past history of cocaine abuse, he denies current use  . Sexual activity: Not on file  Other Topics Concern  . Not on file  Social History Narrative  . Not on file    Allergies: No Known  Allergies  Medications:  Current Outpatient Medications  Medication Sig Dispense Refill  . acetaminophen (TYLENOL) 325 MG tablet Take 650 mg by mouth every 6 (six) hours as needed for moderate pain.     Marland Kitchen dexamethasone (DECADRON) 4 MG tablet Take 2 tablets (8 mg total) daily by mouth. Start the day after chemotherapy for 2 days. 30 tablet 1  . ferrous sulfate 325 (65 FE) MG tablet Take 1 tablet (325 mg total) by mouth daily. 30 tablet 0  . lidocaine (XYLOCAINE) 2 % solution Mix 1 part 2%viscous lidocaine,1part H2O.Swish and/or swallow 65m of this mixture,365m before meals and at bedtime, up to QID 100 mL 5  . lidocaine-prilocaine (EMLA) cream Apply to affected area once 30 g 3  . LORazepam (ATIVAN) 0.5 MG tablet Take 1 tablet (0.5 mg total) every 6 (six) hours as needed by mouth  (Nausea or vomiting). 30 tablet 0  . Morphine Sulfate (MORPHINE CONCENTRATE) 10 mg / 0.5 ml concentrated solution Place 0.5 mLs (10 mg total) into feeding tube every 4 (four) hours as needed for severe pain. 1 Bottle 0  . Niacin (NICOTINEX PO) Apply 1 patch topically.    . Nutritional Supplements (FEEDING SUPPLEMENT, OSMOLITE 1.5 CAL,) LIQD Give 1 bottle Osmolite 1.5 via PEG 5 times daily with 60 mL free water before and after bolus. Drink or flush tube with additional 720 mL free water 3 times daily. 5 Bottle 0  . oxyCODONE (OXY IR/ROXICODONE) 5 MG immediate release tablet 1 to 2 Q 4 hours prn pain 40 tablet 0   No current facility-administered medications for this visit.     Review of Systems: Review of Systems  HENT:   Positive for sore throat and trouble swallowing.   Gastrointestinal: Positive for abdominal pain.  All other systems reviewed and are negative.    PHYSICAL EXAMINATION There were no vitals taken for this visit.  ECOG PERFORMANCE STATUS: 1 - Symptomatic but completely ambulatory  Physical Exam  Constitutional: He is oriented to person, place, and time and well-developed, well-nourished, and in no distress. No distress.  HENT:  Head: Normocephalic and atraumatic.  Mouth/Throat: Uvula is midline, oropharynx is clear and moist and mucous membranes are normal. He does not have dentures. Oral lesions present. Abnormal dentition. Dental caries present. No dental abscesses or uvula swelling. No oropharyngeal exudate or tonsillar abscesses.  Eyes: Conjunctivae and EOM are normal. Pupils are equal, round, and reactive to light. No scleral icterus.  Neck: No thyromegaly present.  Cardiovascular: Normal rate, regular rhythm, normal heart sounds and intact distal pulses.  No murmur heard. Pulmonary/Chest: Effort normal and breath sounds normal. No respiratory distress. He has no wheezes. He has no rales.  Abdominal: Soft. Bowel sounds are normal. He exhibits no distension and no  mass. There is no tenderness. There is no rebound and no guarding.  Musculoskeletal: Normal range of motion. He exhibits no edema.  Lymphadenopathy:    He has no cervical adenopathy.  Neurological: He is alert and oriented to person, place, and time. He has normal reflexes. No cranial nerve deficit. Coordination normal.  Skin: Skin is warm and dry. No rash noted. He is not diaphoretic. No erythema.     LABORATORY DATA: I have personally reviewed the data as listed: Hospital Outpatient Visit on 09/13/2017  Component Date Value Ref Range Status  . WBC 09/13/2017 3.2* 4.0 - 10.5 K/uL Final  . RBC 09/13/2017 4.44  4.22 - 5.81 MIL/uL Final  . Hemoglobin 09/13/2017 13.1  13.0 -  17.0 g/dL Final  . HCT 09/13/2017 38.4* 39.0 - 52.0 % Final  . MCV 09/13/2017 86.5  78.0 - 100.0 fL Final  . MCH 09/13/2017 29.5  26.0 - 34.0 pg Final  . MCHC 09/13/2017 34.1  30.0 - 36.0 g/dL Final  . RDW 09/13/2017 13.1  11.5 - 15.5 % Final  . Platelets 09/13/2017 187  150 - 400 K/uL Final  . Neutrophils Relative % 09/13/2017 76  % Final  . Neutro Abs 09/13/2017 2.4  1.7 - 7.7 K/uL Final  . Lymphocytes Relative 09/13/2017 16  % Final  . Lymphs Abs 09/13/2017 0.5* 0.7 - 4.0 K/uL Final  . Monocytes Relative 09/13/2017 5  % Final  . Monocytes Absolute 09/13/2017 0.2  0.1 - 1.0 K/uL Final  . Eosinophils Relative 09/13/2017 2  % Final  . Eosinophils Absolute 09/13/2017 0.1  0.0 - 0.7 K/uL Final  . Basophils Relative 09/13/2017 1  % Final  . Basophils Absolute 09/13/2017 0.0  0.0 - 0.1 K/uL Final  . Sodium 09/13/2017 135  135 - 145 mmol/L Final  . Potassium 09/13/2017 3.6  3.5 - 5.1 mmol/L Final  . Chloride 09/13/2017 97* 101 - 111 mmol/L Final  . CO2 09/13/2017 28  22 - 32 mmol/L Final  . Glucose, Bld 09/13/2017 123* 65 - 99 mg/dL Final  . BUN 09/13/2017 12  6 - 20 mg/dL Final  . Creatinine, Ser 09/13/2017 0.70  0.61 - 1.24 mg/dL Final  . Calcium 09/13/2017 9.6  8.9 - 10.3 mg/dL Final  . Total Protein 09/13/2017  8.0  6.5 - 8.1 g/dL Final  . Albumin 09/13/2017 4.2  3.5 - 5.0 g/dL Final  . AST 09/13/2017 26  15 - 41 U/L Final  . ALT 09/13/2017 14* 17 - 63 U/L Final  . Alkaline Phosphatase 09/13/2017 69  38 - 126 U/L Final  . Total Bilirubin 09/13/2017 1.7* 0.3 - 1.2 mg/dL Final  . GFR calc non Af Amer 09/13/2017 >60  >60 mL/min Final  . GFR calc Af Amer 09/13/2017 >60  >60 mL/min Final   Comment: (NOTE) The eGFR has been calculated using the CKD EPI equation. This calculation has not been validated in all clinical situations. eGFR's persistently <60 mL/min signify possible Chronic Kidney Disease.   . Anion gap 09/13/2017 10  5 - 15 Final  . Prothrombin Time 09/13/2017 13.9  11.4 - 15.2 seconds Final  . INR 09/13/2017 1.08   Final       Ardath Sax, MD

## 2017-10-20 ENCOUNTER — Ambulatory Visit
Admission: RE | Admit: 2017-10-20 | Discharge: 2017-10-20 | Disposition: A | Discharge status: Home / Self Care | Payer: Medicaid Other | Source: Ambulatory Visit | Attending: Radiation Oncology | Admitting: Radiation Oncology

## 2017-10-20 DIAGNOSIS — Z510 Encounter for antineoplastic radiation therapy: Secondary | ICD-10-CM | POA: Diagnosis not present

## 2017-10-20 DIAGNOSIS — C099 Malignant neoplasm of tonsil, unspecified: Secondary | ICD-10-CM

## 2017-10-20 MED ORDER — SONAFINE EX EMUL
1.0000 "application " | Freq: Two times a day (BID) | CUTANEOUS | Status: DC
  Administered 2017-10-20: 1 via TOPICAL

## 2017-10-21 ENCOUNTER — Ambulatory Visit: Payer: Medicaid Other | Admitting: Nutrition

## 2017-10-21 ENCOUNTER — Ambulatory Visit
Admission: RE | Admit: 2017-10-21 | Discharge: 2017-10-21 | Disposition: A | Discharge status: Home / Self Care | Payer: Medicaid Other | Source: Ambulatory Visit | Attending: Radiation Oncology | Admitting: Radiation Oncology

## 2017-10-21 DIAGNOSIS — Z510 Encounter for antineoplastic radiation therapy: Secondary | ICD-10-CM | POA: Diagnosis not present

## 2017-10-21 NOTE — Progress Notes (Signed)
Nutrition follow-up completed with patient receiving chemoradiation therapy for tonsil cancer. Weight stable at 116 pounds January 3; patient was 116.8 pounds December 27. Patient reports nausea but takes his nausea medication. He denies constipation and diarrhea. Reports he eats or drinks small amounts 2-3 times a week but is not doing this daily. Reports his throat is very sore. States he is using 6 bottles of Osmolite 1.5 or equivalent via feeding tube daily. Reports he flushes "a lot of water"daily through PEG.    Estimated nutrition needs: 1745-1945 calories, 75-90 grams protein, 2.0 L fluid.  6 bottles Osmolite 1.5 provides 2130 cal, 89.4 g protein, 1086 mL free water.  Nutrition diagnosis: Severe malnutrition continues.  Intervention: Educated patient on the importance of continuing to try to swallow water and other liquids as well as food if tolerated. Encouraged patient to complete swallowing exercises. Recommended patient continue 6 bottles daily of Osmolite 1.5 with 60 mL free water before and after bolus feeding. In addition, patient is flushing feeding tube with 240 mL free water 3 times a day Teach back method used.  Monitoring, evaluation, goals: Patient will tolerate adequate calories and protein to promote weight gain and healing.  Next visit: To be scheduled as needed. Final treatment is Monday, January 7.  **Disclaimer: This note was dictated with voice recognition software. Similar sounding words can inadvertently be transcribed and this note may contain transcription errors which may not have been corrected upon publication of note.**

## 2017-10-22 ENCOUNTER — Ambulatory Visit: Payer: Medicaid Other

## 2017-10-22 ENCOUNTER — Emergency Department (HOSPITAL_COMMUNITY): Payer: Medicaid Other

## 2017-10-22 ENCOUNTER — Encounter: Payer: Self-pay | Admitting: Hematology and Oncology

## 2017-10-22 ENCOUNTER — Inpatient Hospital Stay: Payer: Medicaid Other | Attending: Hematology and Oncology | Admitting: Hematology and Oncology

## 2017-10-22 ENCOUNTER — Ambulatory Visit (HOSPITAL_BASED_OUTPATIENT_CLINIC_OR_DEPARTMENT_OTHER): Payer: Medicaid Other

## 2017-10-22 ENCOUNTER — Encounter (HOSPITAL_COMMUNITY): Payer: Self-pay | Admitting: Internal Medicine

## 2017-10-22 ENCOUNTER — Ambulatory Visit
Admission: RE | Admit: 2017-10-22 | Discharge: 2017-10-22 | Disposition: A | Discharge status: Home / Self Care | Payer: Medicaid Other | Source: Ambulatory Visit | Attending: Radiation Oncology | Admitting: Radiation Oncology

## 2017-10-22 ENCOUNTER — Inpatient Hospital Stay (HOSPITAL_COMMUNITY)
Admission: EM | Admit: 2017-10-22 | Discharge: 2017-10-25 | DRG: 808 | Disposition: A | Discharge status: Home / Self Care | Payer: Medicaid Other | Attending: Internal Medicine | Admitting: Internal Medicine

## 2017-10-22 VITALS — BP 105/68 | HR 78 | Temp 100.8°F | Resp 20 | Wt 116.7 lb

## 2017-10-22 DIAGNOSIS — C099 Malignant neoplasm of tonsil, unspecified: Secondary | ICD-10-CM | POA: Diagnosis not present

## 2017-10-22 DIAGNOSIS — C090 Malignant neoplasm of tonsillar fossa: Secondary | ICD-10-CM | POA: Diagnosis present

## 2017-10-22 DIAGNOSIS — E871 Hypo-osmolality and hyponatremia: Secondary | ICD-10-CM | POA: Diagnosis present

## 2017-10-22 DIAGNOSIS — D709 Neutropenia, unspecified: Secondary | ICD-10-CM

## 2017-10-22 DIAGNOSIS — K208 Other esophagitis: Secondary | ICD-10-CM | POA: Diagnosis present

## 2017-10-22 DIAGNOSIS — D649 Anemia, unspecified: Secondary | ICD-10-CM | POA: Diagnosis present

## 2017-10-22 DIAGNOSIS — Z791 Long term (current) use of non-steroidal anti-inflammatories (NSAID): Secondary | ICD-10-CM

## 2017-10-22 DIAGNOSIS — J029 Acute pharyngitis, unspecified: Secondary | ICD-10-CM

## 2017-10-22 DIAGNOSIS — E8809 Other disorders of plasma-protein metabolism, not elsewhere classified: Secondary | ICD-10-CM

## 2017-10-22 DIAGNOSIS — R05 Cough: Secondary | ICD-10-CM

## 2017-10-22 DIAGNOSIS — R059 Cough, unspecified: Secondary | ICD-10-CM

## 2017-10-22 DIAGNOSIS — Z931 Gastrostomy status: Secondary | ICD-10-CM

## 2017-10-22 DIAGNOSIS — R509 Fever, unspecified: Secondary | ICD-10-CM

## 2017-10-22 DIAGNOSIS — Z833 Family history of diabetes mellitus: Secondary | ICD-10-CM

## 2017-10-22 DIAGNOSIS — Z681 Body mass index (BMI) 19 or less, adult: Secondary | ICD-10-CM

## 2017-10-22 DIAGNOSIS — Z79891 Long term (current) use of opiate analgesic: Secondary | ICD-10-CM

## 2017-10-22 DIAGNOSIS — E43 Unspecified severe protein-calorie malnutrition: Secondary | ICD-10-CM | POA: Diagnosis present

## 2017-10-22 DIAGNOSIS — D703 Neutropenia due to infection: Principal | ICD-10-CM | POA: Diagnosis present

## 2017-10-22 DIAGNOSIS — D638 Anemia in other chronic diseases classified elsewhere: Secondary | ICD-10-CM | POA: Diagnosis present

## 2017-10-22 DIAGNOSIS — Z923 Personal history of irradiation: Secondary | ICD-10-CM

## 2017-10-22 DIAGNOSIS — D72819 Decreased white blood cell count, unspecified: Secondary | ICD-10-CM | POA: Diagnosis present

## 2017-10-22 DIAGNOSIS — Z87891 Personal history of nicotine dependence: Secondary | ICD-10-CM

## 2017-10-22 DIAGNOSIS — Z7952 Long term (current) use of systemic steroids: Secondary | ICD-10-CM

## 2017-10-22 DIAGNOSIS — B370 Candidal stomatitis: Secondary | ICD-10-CM | POA: Diagnosis not present

## 2017-10-22 DIAGNOSIS — Z79899 Other long term (current) drug therapy: Secondary | ICD-10-CM

## 2017-10-22 DIAGNOSIS — E440 Moderate protein-calorie malnutrition: Secondary | ICD-10-CM

## 2017-10-22 DIAGNOSIS — R5081 Fever presenting with conditions classified elsewhere: Secondary | ICD-10-CM | POA: Diagnosis present

## 2017-10-22 DIAGNOSIS — R1310 Dysphagia, unspecified: Secondary | ICD-10-CM | POA: Diagnosis present

## 2017-10-22 LAB — URINALYSIS, ROUTINE W REFLEX MICROSCOPIC
Bilirubin Urine: NEGATIVE
GLUCOSE, UA: NEGATIVE mg/dL
Hgb urine dipstick: NEGATIVE
KETONES UR: NEGATIVE mg/dL
LEUKOCYTES UA: NEGATIVE
Nitrite: NEGATIVE
PH: 6 (ref 5.0–8.0)
Protein, ur: NEGATIVE mg/dL
SPECIFIC GRAVITY, URINE: 1.017 (ref 1.005–1.030)

## 2017-10-22 LAB — CBC WITH DIFFERENTIAL/PLATELET
BASO%: 0.2 % (ref 0.0–2.0)
BASOS ABS: 0 10*3/uL (ref 0.0–0.1)
BASOS ABS: 0 10*3/uL (ref 0.0–0.1)
BASOS PCT: 0 %
EOS PCT: 1 %
EOS%: 1.1 % (ref 0.0–7.0)
Eosinophils Absolute: 0 10*3/uL (ref 0.0–0.5)
Eosinophils Absolute: 0 10*3/uL (ref 0.0–0.7)
HCT: 29.5 % — ABNORMAL LOW (ref 39.0–52.0)
HEMATOCRIT: 30.1 % — AB (ref 38.4–49.9)
HEMOGLOBIN: 10 g/dL — AB (ref 13.0–17.1)
Hemoglobin: 9.9 g/dL — ABNORMAL LOW (ref 13.0–17.0)
LYMPH#: 0.2 10*3/uL — AB (ref 0.9–3.3)
LYMPH%: 10.9 % — ABNORMAL LOW (ref 14.0–49.0)
LYMPHS PCT: 29 %
Lymphs Abs: 0.7 10*3/uL (ref 0.7–4.0)
MCH: 28.9 pg (ref 27.2–33.4)
MCH: 28.8 pg (ref 26.0–34.0)
MCHC: 33.2 g/dL (ref 32.0–36.0)
MCHC: 33.6 g/dL (ref 30.0–36.0)
MCV: 87 fL (ref 79.3–98.0)
MCV: 85.8 fL (ref 78.0–100.0)
MONO ABS: 0.4 10*3/uL (ref 0.1–1.0)
MONO#: 0.7 10*3/uL (ref 0.1–0.9)
MONO%: 34.4 % — ABNORMAL HIGH (ref 0.0–14.0)
Monocytes Relative: 17 %
NEUT#: 1.1 10*3/uL — ABNORMAL LOW (ref 1.5–6.5)
NEUT%: 53.4 % (ref 39.0–75.0)
Neutro Abs: 1.2 10*3/uL — ABNORMAL LOW (ref 1.7–7.7)
Neutrophils Relative %: 53 %
PLATELETS: 213 10*3/uL (ref 150–400)
Platelets: 216 10*3/uL (ref 140–400)
RBC: 3.46 10*6/uL — ABNORMAL LOW (ref 4.20–5.82)
RBC: 3.44 MIL/uL — ABNORMAL LOW (ref 4.22–5.81)
RDW: 13.9 % (ref 11.0–14.6)
RDW: 13.6 % (ref 11.5–15.5)
WBC: 2 10*3/uL — AB (ref 4.0–10.3)
WBC: 2.2 10*3/uL — ABNORMAL LOW (ref 4.0–10.5)

## 2017-10-22 LAB — COMPREHENSIVE METABOLIC PANEL
ALBUMIN: 3.2 g/dL — AB (ref 3.5–5.0)
ALK PHOS: 65 U/L (ref 38–126)
ALT: 17 U/L (ref 17–63)
AST: 19 U/L (ref 15–41)
Anion gap: 9 (ref 5–15)
BILIRUBIN TOTAL: 0.7 mg/dL (ref 0.3–1.2)
BUN: 10 mg/dL (ref 6–20)
CALCIUM: 9.4 mg/dL (ref 8.9–10.3)
CO2: 29 mmol/L (ref 22–32)
Chloride: 95 mmol/L — ABNORMAL LOW (ref 101–111)
Creatinine, Ser: 0.49 mg/dL — ABNORMAL LOW (ref 0.61–1.24)
GFR calc Af Amer: >60 mL/min (ref >60)
GFR calc non Af Amer: >60 mL/min (ref >60)
Glucose, Bld: 112 mg/dL — ABNORMAL HIGH (ref 65–99)
Potassium: 3.9 mmol/L (ref 3.5–5.1)
Sodium: 133 mmol/L — ABNORMAL LOW (ref 135–145)
TOTAL PROTEIN: 6.8 g/dL (ref 6.5–8.1)

## 2017-10-22 LAB — INFLUENZA PANEL BY PCR (TYPE A & B)
Influenza A By PCR: NEGATIVE
Influenza B By PCR: NEGATIVE

## 2017-10-22 LAB — RAPID STREP SCREEN (MED CTR MEBANE ONLY): Streptococcus, Group A Screen (Direct): NEGATIVE

## 2017-10-22 LAB — I-STAT CG4 LACTIC ACID, ED: Lactic Acid, Venous: 0.69 mmol/L (ref 0.5–1.9)

## 2017-10-22 MED ORDER — SODIUM CHLORIDE 0.9 % IV BOLUS (SEPSIS): 1000.0000 mL | Freq: Once | INTRAVENOUS | Status: DC

## 2017-10-22 MED ORDER — DEXTROSE 5 % IV SOLN
2.0000 g | Freq: Three times a day (TID) | INTRAVENOUS | Status: DC
  Administered 2017-10-22 – 2017-10-25 (×8): 2 g via INTRAVENOUS
  Filled 2017-10-22 (×10): qty 2

## 2017-10-22 MED ORDER — SODIUM CHLORIDE 0.9 % IV SOLN
INTRAVENOUS | Status: DC
  Administered 2017-10-22 – 2017-10-24 (×3): via INTRAVENOUS

## 2017-10-22 MED ORDER — FERROUS SULFATE 300 (60 FE) MG/5ML PO SYRP
300.0000 mg | ORAL_SOLUTION | Freq: Every day | ORAL | Status: DC
  Administered 2017-10-22 – 2017-10-25 (×4): 300 mg via ORAL
  Filled 2017-10-22 (×5): qty 5

## 2017-10-22 MED ORDER — MORPHINE SULFATE (CONCENTRATE) 10 MG/0.5ML PO SOLN
10.0000 mg | ORAL | Status: DC | PRN
  Administered 2017-10-22 – 2017-10-24 (×2): 10 mg
  Filled 2017-10-22 (×3): qty 0.5

## 2017-10-22 MED ORDER — LIDOCAINE VISCOUS 2 % MT SOLN
10.0000 mL | Freq: Three times a day (TID) | OROMUCOSAL | Status: DC
  Administered 2017-10-22 – 2017-10-24 (×6): 10 mL via OROMUCOSAL
  Administered 2017-10-24: 15 mL via OROMUCOSAL
  Administered 2017-10-24 – 2017-10-25 (×3): 10 mL via OROMUCOSAL
  Filled 2017-10-22 (×13): qty 15

## 2017-10-22 MED ORDER — FLUCONAZOLE 100 MG PO TABS: 200.0000 mg | ORAL_TABLET | Freq: Every day | ORAL | Status: DC

## 2017-10-22 MED ORDER — ACETAMINOPHEN 325 MG PO TABS: 650.0000 mg | ORAL_TABLET | Freq: Four times a day (QID) | ORAL | Status: DC | PRN

## 2017-10-22 MED ORDER — ACETAMINOPHEN 650 MG RE SUPP: 650.0000 mg | Freq: Four times a day (QID) | RECTAL | Status: DC | PRN

## 2017-10-22 MED ORDER — LORAZEPAM 0.5 MG PO TABS: 0.5000 mg | ORAL_TABLET | Freq: Four times a day (QID) | ORAL | Status: DC | PRN

## 2017-10-22 MED ORDER — LORAZEPAM 2 MG/ML IJ SOLN: 0.5000 mg | Freq: Four times a day (QID) | INTRAMUSCULAR | Status: DC | PRN

## 2017-10-22 MED ORDER — ENOXAPARIN SODIUM 40 MG/0.4ML ~~LOC~~ SOLN
40.0000 mg | SUBCUTANEOUS | Status: DC
  Filled 2017-10-22 (×3): qty 0.4

## 2017-10-22 MED ORDER — FERROUS SULFATE 325 (65 FE) MG PO TABS: 325.0000 mg | ORAL_TABLET | Freq: Every day | ORAL | Status: DC

## 2017-10-22 MED ORDER — FLUCONAZOLE 40 MG/ML PO SUSR
200.0000 mg | Freq: Every day | ORAL | Status: DC
  Administered 2017-10-22 – 2017-10-23 (×2): 200 mg via ORAL
  Filled 2017-10-22 (×3): qty 5

## 2017-10-22 MED ORDER — SONAFINE EX EMUL
1.0000 "application " | Freq: Two times a day (BID) | CUTANEOUS | Status: DC
  Administered 2017-10-22 – 2017-10-25 (×6): 1 via TOPICAL
  Filled 2017-10-22 (×2): qty 1

## 2017-10-22 MED ORDER — IBUPROFEN 200 MG PO TABS: 200.0000 mg | ORAL_TABLET | Freq: Every day | ORAL | Status: DC | PRN

## 2017-10-22 MED ORDER — OXYCODONE HCL 5 MG PO TABS: 5.0000 mg | ORAL_TABLET | ORAL | Status: DC | PRN

## 2017-10-22 MED ORDER — MAGIC MOUTHWASH
5.0000 mL | Freq: Four times a day (QID) | ORAL | Status: DC
  Administered 2017-10-22 – 2017-10-25 (×11): 5 mL via ORAL
  Filled 2017-10-22 (×12): qty 5

## 2017-10-22 MED ORDER — MORPHINE SULFATE (PF) 4 MG/ML IV SOLN
4.0000 mg | Freq: Once | INTRAVENOUS | Status: AC
  Administered 2017-10-22: 4 mg via INTRAVENOUS
  Filled 2017-10-22: qty 1

## 2017-10-22 MED ORDER — VANCOMYCIN HCL IN DEXTROSE 750-5 MG/150ML-% IV SOLN
750.0000 mg | Freq: Two times a day (BID) | INTRAVENOUS | Status: DC
  Administered 2017-10-23 – 2017-10-24 (×3): 750 mg via INTRAVENOUS
  Filled 2017-10-22 (×4): qty 150

## 2017-10-22 MED ORDER — VANCOMYCIN HCL IN DEXTROSE 750-5 MG/150ML-% IV SOLN
750.0000 mg | Freq: Once | INTRAVENOUS | Status: AC
  Administered 2017-10-22: 750 mg via INTRAVENOUS
  Filled 2017-10-22: qty 150

## 2017-10-22 MED ORDER — SODIUM CHLORIDE 0.9 % IV BOLUS (SEPSIS)
1000.0000 mL | Freq: Once | INTRAVENOUS | Status: AC
  Administered 2017-10-22: 1000 mL via INTRAVENOUS

## 2017-10-22 MED ORDER — PRO-STAT SUGAR FREE PO LIQD
30.0000 mL | Freq: Two times a day (BID) | ORAL | Status: DC
  Administered 2017-10-22 – 2017-10-25 (×6): 30 mL via ORAL
  Filled 2017-10-22 (×6): qty 30

## 2017-10-22 MED ORDER — ACETAMINOPHEN 325 MG PO TABS
650.0000 mg | ORAL_TABLET | Freq: Once | ORAL | Status: AC
  Administered 2017-10-22: 650 mg via ORAL
  Filled 2017-10-22: qty 2

## 2017-10-22 MED ORDER — OXYCODONE HCL 5 MG/5ML PO SOLN: 5.0000 mg | ORAL | Status: DC | PRN

## 2017-10-22 MED ORDER — MAGIC MOUTHWASH W/LIDOCAINE
15.0000 mL | Freq: Once | ORAL | Status: AC
  Administered 2017-10-22: 15 mL via ORAL
  Filled 2017-10-22: qty 15

## 2017-10-22 MED ORDER — OSMOLITE 1.5 CAL PO LIQD
237.0000 mL | Freq: Every day | ORAL | Status: DC
Start: 2017-10-22 — End: 2017-10-25
  Administered 2017-10-22 – 2017-10-25 (×14): 237 mL
  Filled 2017-10-22 (×17): qty 237

## 2017-10-22 NOTE — Progress Notes (Signed)
Pt educated on lowest bed for safety but he has refused and would like it a little higher.

## 2017-10-22 NOTE — ED Provider Notes (Signed)
Medical screening examination/treatment/procedure(s) were conducted as a shared visit with non-physician practitioner(s) and myself.  I personally evaluated the patient during the encounter.   EKG Interpretation None     Patient has history of tonsillar squamous cell carcinoma.  He is undergoing chemotherapy and radiation therapy.  He was referred to the emergency department the cancer center for concern of neutropenic fever.  He was found of a temperature 100.8 with CBC white count 2.0 and neutrophil count 1.1.  Patient is alert and interactive.  No respiratory distress.  Posterior oropharynx has erythema over the tonsillar pillars and the soft palate.  Airway is widely patent.  Patient has blistering slough of skin on the lateral left neck from radiation therapy she does not have respiratory distress but occasional cough.  Breath sounds are soft without gross wheeze rhonchi or rale.  No peripheral edema.  Status is clear, movements are coordinated purposeful symmetric.  I agree with plan and management.   Charlesetta Shanks, MD 10/22/17 [phone]

## 2017-10-22 NOTE — Progress Notes (Signed)
Pharmacy Antibiotic Note  [name] is a 57 y.o. male admitted on 10/22/2017 with febrile neutropenia.  Pharmacy has been consulted for Vancomycin, cefepime dosing.  Plan: Vancomycin 750mg  iv q12hr  Goal AUC = 400 - 500 for all indications, except meningitis (goal AUC > 500 and Cmin 15-20 mcg/mL)  Cefepime 2gm iv q8hr  Height: 5\' 7"  (419.3 cm) Weight: 116 lb 15.8 oz (53.1 kg) IBW/kg (Calculated) : 66.1  Temp (24hrs), Avg:100.1 F (37.8 C), Min:99.2 F (37.3 C), Max:100.8 F (38.2 C)  Recent Labs  Lab 10/22/17 1422 10/22/17 1621 10/22/17 1636  WBC 2.0* 2.2*  --   CREATININE  --  0.49*  --   LATICACIDVEN  --   --  0.69    Estimated Creatinine Clearance: 78.4 mL/min (A) (by C-G formula based on SCr of 0.49 mg/dL (L)).    No Known Allergies  Antimicrobials this admission: Vancomycin 10/22/2017 >> Cefepime 10/22/2017 >>  Dose adjustments this admission: -  Microbiology results: pending  Thank you for allowing pharmacy to be a part of this patient's care.  Nani Skillern Crowford 10/22/2017 8:40 PM

## 2017-10-22 NOTE — ED Notes (Signed)
Bed: WA14 Expected date:  Expected time:  Means of arrival:  Comments: 

## 2017-10-22 NOTE — ED Triage Notes (Signed)
Pt arrived to [facility] from the [facility] with a fever, weakness, and sore throat x2 days.  Dr. Lebron Conners spoke with ED charge nurse, Manuela Schwartz, for further evaluation for possible admission for neutropenic fever. Nurse from cancer center reports patient oral temperature of 100.8. Pt was not given any antepyretic medications for fever.

## 2017-10-22 NOTE — ED Provider Notes (Signed)
[facility] DEPT Provider Note   CSN: [id number] Arrival date & time: 10/22/17  1458     History   Chief Complaint Chief Complaint  Patient presents with  . Fever  . Weakness    HPI Christian LITZINGER is a 57 y.o. male with a PMHx of left tonsil squamous cell carcinoma currently under the care of Dr. Lebron Conners, receiving Carboplatin and Taxol (finished last doses on 10/14/17) with concurrent radiation (last tx yesterday), who presents to the ED sent over from the cancer center for possible admission for neutropenic fever.  Pt was there to receive his radiation therapy, and was found to have a temp of 100.8; CBC w/diff was done and showed WBC 2.0 with neut# 1.1, so he was sent here for concerns of neutropenic fever.  Patient states that he started having subjective fever and chills yesterday, as well as a sore throat, cough with cloudy white sputum production mixed with red streaks, and clear rhinorrhea.  He used ibuprofen around 6 AM, and oxycodone with no relief of symptoms, no known aggravating factors.  He mentions that last week he was placed on a medication that he takes every 8 hours that he thinks was for thrush, but he cannot recall the name of the medication.  He denies any drooling, trismus, ear pain or drainage, chest pain, shortness of breath, abd pain, N/V/D/C, hematuria, dysuria, myalgias, arthralgias, numbness, tingling, focal weakness, or any other complaints at this time.  He denies recent travel or sick contacts.  His PCP is Dr. Isidore Moos.    The history is provided by the patient and medical records. No language interpreter was used.  Fever   This is a new problem. The current episode started yesterday. The problem occurs constantly. The problem has not changed since onset.The maximum temperature noted was 100 to 100.9 F. The temperature was taken using an oral thermometer. Associated symptoms include sore throat and cough. Pertinent negatives include  no chest pain, no diarrhea and no vomiting. He has tried nothing for the symptoms. The treatment provided no relief.    Past Medical History:  Diagnosis Date  . Cancer [facility])    left tonsil cancer    Patient Active Problem List   Diagnosis Date Noted  . Malnutrition of moderate degree 10/08/2017  . Hyponatremia 10/07/2017  . Anemia in other chronic diseases classified elsewhere 10/07/2017  . Neutropenic fever ([place]) 10/06/2017  . Pancytopenia, acquired ([place]) 10/01/2017  . Weight loss 10/01/2017  . Alcohol withdrawal syndrome without complication ([place]) 99/24/2683  . Tobacco abuse 08/12/2017  . Squamous cell carcinoma of left tonsil ([place]) 08/03/2017    Past Surgical History:  Procedure Laterality Date  . IR FLUORO GUIDE PORT INSERTION RIGHT  09/13/2017  . IR GASTROSTOMY TUBE MOD SED  09/13/2017  . IR US GUIDE VASC ACCESS RIGHT  09/13/2017  . MULTIPLE EXTRACTIONS WITH ALVEOLOPLASTY N/A 08/09/2017   Procedure: Extraction of tooth #'s 1-6, 11-17,21,22,26-30 and 32 with alveoloplasty and bilateral mandibular tori reductions;  Surgeon: Lenn Cal, DDS;  Location: [facility] ORS;  Service: Oral Surgery;  Laterality: N/A;  . tonsil biopsy     left tonsil       Home Medications    Prior to Admission medications   Medication Sig Start Date End Date Taking? Authorizing Provider  acetaminophen (TYLENOL) 325 MG tablet Take 650 mg by mouth every 6 (six) hours as needed for moderate pain.     [provider]  dexamethasone (DECADRON) 4 MG  tablet Take 2 tablets (8 mg total) daily by mouth. Start the day after chemotherapy for 2 days. 09/01/17   Ardath Sax, MD  ferrous sulfate 325 (65 FE) MG tablet Take 1 tablet (325 mg total) by mouth daily. 10/10/17 11/09/17  Hosie Poisson, MD  lidocaine (XYLOCAINE) 2 % solution Mix 1 part 2%viscous lidocaine,1part H2O.Swish and/or swallow 110mL of this mixture,54min before meals and at bedtime, up to QID 09/05/17   Eppie Gibson, MD    lidocaine-prilocaine (EMLA) cream Apply to affected area once 09/01/17   Perlov, Marinell Blight, MD  LORazepam (ATIVAN) 0.5 MG tablet Take 1 tablet (0.5 mg total) every 6 (six) hours as needed by mouth (Nausea or vomiting). 08/31/17   Ardath Sax, MD  Morphine Sulfate (MORPHINE CONCENTRATE) 10 mg / 0.5 ml concentrated solution Place 0.5 mLs (10 mg total) into feeding tube every 4 (four) hours as needed for severe pain. 09/15/17   Ardath Sax, MD  Niacin (NICOTINEX PO) Apply 1 patch topically.    [provider]  Nutritional Supplements (FEEDING SUPPLEMENT, OSMOLITE 1.5 CAL,) LIQD Give 1 bottle Osmolite 1.5 via PEG 5 times daily with 60 mL free water before and after bolus. Drink or flush tube with additional 720 mL free water 3 times daily. 09/17/17   Eppie Gibson, MD  oxyCODONE (OXY IR/ROXICODONE) 5 MG immediate release tablet 1 to 2 Q 4 hours prn pain 10/14/17   Sandi Mealy E., PA-C  Wound Dressings (SONAFINE) Apply 1 application topically 2 (two) times daily. 10/20/17   Eppie Gibson, MD    Family History Family History  Problem Relation Age of Onset  . Diabetes Mother     Social History Social History   Tobacco Use  . Smoking status: Current Every Day Smoker    Packs/day: 1.00    Years: 30.00    Pack years: 30.00  . Smokeless tobacco: Never Used  Substance Use Topics  . Alcohol use: Yes    Alcohol/week: 4.2 oz    Types: 7 Cans of beer per week    Comment: 1 pint daily   . Drug use: No    Comment: he has a past history of cocaine abuse, he denies current use     Allergies   Patient has no known allergies.   Review of Systems Review of Systems  Constitutional: Positive for chills and fever.  HENT: Positive for rhinorrhea and sore throat. Negative for drooling, ear discharge, ear pain and trouble swallowing.   Respiratory: Positive for cough. Negative for shortness of breath.   Cardiovascular: Negative for chest pain.  Gastrointestinal: Negative for  abdominal pain, constipation, diarrhea, nausea and vomiting.  Genitourinary: Negative for dysuria and hematuria.  Musculoskeletal: Negative for arthralgias and myalgias.  Skin: Negative for color change.  Allergic/Immunologic: Positive for immunocompromised state (cancer).  Neurological: Negative for weakness and numbness.  Psychiatric/Behavioral: Negative for confusion.   All other systems reviewed and are negative for acute change except as noted in the HPI.    Physical Exam Updated Vital Signs BP 110/73 (BP Location: Left Arm)   Pulse 80   Temp (!) 100.4 F (38 C) (Oral)   Resp 16   Ht 5\' 7"  (1.702 m)   Wt 52.6 kg (116 lb)   SpO2 100%   BMI 18.17 kg/m   Physical Exam  Constitutional: He is oriented to person, place, and time. He appears well-developed and well-nourished.  Non-toxic appearance. No distress.  Febrile 100.4, nontoxic, NAD, frail gentleman who appears  much older than stated age  HENT:  Head: Normocephalic and atraumatic.  Nose: Mucosal edema and rhinorrhea present.  Mouth/Throat: Uvula is midline and mucous membranes are normal. No trismus in the jaw. No uvula swelling. Posterior oropharyngeal erythema present. No oropharyngeal exudate, posterior oropharyngeal edema or tonsillar abscesses. No tonsillar exudate.  Slight erythema/irritation to skin of L neck, no s/sx of cellulitis Nose with mild mucosal edema and clear rhinorrhea. Tongue and posterior oropharynx coated with white film which scrapes off and reveals erythematous base, mildly tender. Oropharynx beefy red and injected, without uvular swelling or deviation, no trismus or drooling, tonsils difficult to visualize due to pt's tongue blocking view, despite attempting to use tongue depressor. No obvious tonsillar swelling, no definite exudates seen. No definite PTA noted.   Eyes: Conjunctivae and EOM are normal. Right eye exhibits no discharge. Left eye exhibits no discharge.  Neck: Normal range of motion. Neck  supple.  Cardiovascular: Normal rate, regular rhythm, normal heart sounds and intact distal pulses. Exam reveals no gallop and no friction rub.  No murmur heard. Pulmonary/Chest: Effort normal and breath sounds normal. No respiratory distress. He has no decreased breath sounds. He has no wheezes. He has no rhonchi. He has no rales.  Poor inspiratory effort limits exam slightly, but overall CTAB in all lung fields, no w/r/r, no hypoxia or increased WOB, speaking in full sentences, SpO2 100% on RA   Abdominal: Soft. Normal appearance and bowel sounds are normal. He exhibits no distension. There is no tenderness. There is no rigidity, no rebound, no guarding, no CVA tenderness, no tenderness at McBurney's point and negative Murphy's sign.  Musculoskeletal: Normal range of motion.  Neurological: He is alert and oriented to person, place, and time. He has normal strength. No sensory deficit.  Skin: Skin is warm, dry and intact. No rash noted. There is erythema.  L neck skin irritation as mentioned above  Psychiatric: He has a normal mood and affect.  Nursing note and vitals reviewed.    ED Treatments / Results  Labs (all labs ordered are listed, but only abnormal results are displayed) Labs Reviewed  COMPREHENSIVE METABOLIC PANEL - Abnormal; Notable for the following components:      Result Value   Sodium 133 (*)    Chloride 95 (*)    Glucose, Bld 112 (*)    Creatinine, Ser 0.49 (*)    Albumin 3.2 (*)    All other components within normal limits  CBC WITH DIFFERENTIAL/PLATELET - Abnormal; Notable for the following components:   WBC 2.2 (*)    RBC 3.44 (*)    Hemoglobin 9.9 (*)    HCT 29.5 (*)    Neutro Abs 1.2 (*)    All other components within normal limits  RAPID STREP SCREEN (NOT AT [facility])  CULTURE, BLOOD (ROUTINE X 2)  CULTURE, BLOOD (ROUTINE X 2)  URINE CULTURE  CULTURE, GROUP A STREP ([place])  INFLUENZA PANEL BY PCR (TYPE A & B)  URINALYSIS, ROUTINE W REFLEX MICROSCOPIC  I-STAT  CG4 LACTIC ACID, ED     Results for JAIDEEP, POLLACK (MRN [id number]) as of 10/22/2017 15:29  Ref. Range 10/22/2017 14:22  WBC Latest Ref Range: 4.0 - 10.3 10e3/uL 2.0 (L)  RBC Latest Ref Range: 4.20 - 5.82 10e6/uL 3.46 (L)  Hemoglobin Latest Ref Range: 13.0 - 17.1 g/dL 10.0 (L)  HCT Latest Ref Range: 38.4 - 49.9 % 30.1 (L)  MCV Latest Ref Range: 79.3 - 98.0 fL 87.0  MCH Latest Ref  Range: 27.2 - 33.4 pg 28.9  MCHC Latest Ref Range: 32.0 - 36.0 g/dL 33.2  RDW Latest Ref Range: 11.0 - 14.6 % 13.9  Platelets Latest Ref Range: 140 - 400 10e3/uL 216  NEUT% Latest Ref Range: 39.0 - 75.0 % 53.4  LYMPH% Latest Ref Range: 14.0 - 49.0 % 10.9 (L)  MONO% Latest Ref Range: 0.0 - 14.0 % 34.4 (H)  EOS% Latest Ref Range: 0.0 - 7.0 % 1.1  BASO% Latest Ref Range: 0.0 - 2.0 % 0.2  NEUT# Latest Ref Range: 1.5 - 6.5 10e3/uL 1.1 (L)  MONO# Latest Ref Range: 0.1 - 0.9 10e3/uL 0.7  Eosinophils Absolute Latest Ref Range: 0.0 - 0.5 10e3/uL 0.0  Basophils Absolute Latest Ref Range: 0.0 - 0.1 10e3/uL 0.0  lymph# Latest Ref Range: 0.9 - 3.3 10e3/uL 0.2 (L)   EKG  EKG Interpretation None       Radiology Dg Chest 2 View  Result Date: 10/22/2017 CLINICAL DATA:  Head and neck cancer. EXAM: CHEST  2 VIEW COMPARISON:  10/06/2017 FINDINGS: Lungs are hyperexpanded. The lungs are clear without focal pneumonia, edema, pneumothorax or pleural effusion. The cardiopericardial silhouette is within normal limits for size. Right Port-A-Cath tip overlies the distal SVC. The visualized bony structures of the thorax are intact. Telemetry leads overlie the chest. IMPRESSION: Hyperexpansion without acute cardiopulmonary findings. Electronically Signed   By: Misty Stanley M.D.   On: 10/22/2017 18:27    Procedures Procedures (including critical care time)  Medications Ordered in ED Medications  sodium chloride 0.9 % bolus 1,000 mL (not administered)  magic mouthwash w/lidocaine (15 mLs Oral Given 10/22/17 1708)  sodium chloride  0.9 % bolus 1,000 mL (0 mLs Intravenous Stopped 10/22/17 1825)  morphine 4 MG/ML injection 4 mg (4 mg Intravenous Given 10/22/17 1700)  acetaminophen (TYLENOL) tablet 650 mg (650 mg Oral Given 10/22/17 1824)     Initial Impression / Assessment and Plan / ED Course  I have reviewed the triage vital signs and the nursing notes.  Pertinent labs & imaging results that were available during my care of the patient were reviewed by me and considered in my medical decision making (see chart for details).     57 y.o. male sent here from the cancer center for possible neutropenic fever, temp 100.8, CBC showing WBC 2.0 with neutrophil # 1.1. Pt finished chemo 8 days ago, had radiation yesterday, was there for radiation today but noted to have a fever. States yesterday started having sore throat, cough, chills/fever, and rhinorrhea. On exam, tongue and oropharynx coated in white membrane which scrapes off and reveals erythematous base on the tongue, oropharynx beefy red, difficult to see tonsils but no definite exudates noted. No drooling or trismus. Mild rhinorrhea. Clear lungs although poor inspiratory effort somewhat limits exam. Temp 100.4. No tachycardia or hypotension, remainder of VS stable. Will get strep test, flu test, labs, cultures, and CXR; will give magic mouthwash, morphine, and fluids, then reassess. Will likely require admission for his neutropenic fever. Discussed case with my attending Dr. Johnney Killian who agrees with plan.   6:35 PM CBC w/diff with WBC 2.2 and Neut# 1.2, and chronic stable anemia, confirming cancer center's values from earlier. CMP with chronic stable low Na 133, Cl 95, and albumin 3.2. Lactic neg. RST neg. Flu test neg. CXR negative. U/A not yet done, but will proceed with admission for neutropenic fever. Pt comfortable at this time.   6:49 PM Dr. Maudie Mercury of [facility] returning page and will admit. Holding  orders to be placed by admitting team. Please see their notes for further  documentation of care. I appreciate their help with this pleasant pt's care. Pt stable at time of admission.    Final Clinical Impressions(s) / ED Diagnoses   Final diagnoses:  Neutropenic fever ([place])  Sore throat  Thrush  Cough  Hypoalbuminemia    ED Discharge Orders    [address] [place], [place], [place] 10/22/17 1849    Charlesetta Shanks, MD 10/24/17 1139

## 2017-10-22 NOTE — ED Notes (Signed)
Attempted to call report, secretary requested for RN to call us back because she is still getting report.

## 2017-10-22 NOTE — ED Notes (Signed)
Pt is aware urine sample is needed. 

## 2017-10-22 NOTE — ED Notes (Signed)
ED TO INPATIENT HANDOFF REPORT  Name/Age/Gender [name] 57 y.o. male  Code Status Code Status History    Date Active Date Inactive Code Status Order ID Comments User Context   10/06/2017 18:20 10/09/2017 14:02 Full Code [id number]  Christian Poisson, MD Inpatient   03/25/2012 13:44 03/25/2012 21:57 Full Code [id number]  Christian Mako, PA ED      Home/SNF/Other Home  Chief Complaint weakness  Level of Care/Admitting Diagnosis ED Disposition    ED Disposition Condition [name] [[id number]]  Level of Care: Med-Surg [16]  Diagnosis: Fever [989211]  Admitting Physician: [name] [[id number]]  Attending Physician: [name] [[id number]]  PT Class (Do Not Modify): Observation [104]  PT Acc Code (Do Not Modify): Observation [10022]       Medical History Past Medical History:  Diagnosis Date  . Cancer [facility])    left tonsil cancer    Allergies No Known Allergies  IV Location/Drains/Wounds Patient Lines/Drains/Airways Status   Active Line/Drains/Airways    Name:   Placement date:   Placement time:   Site:   Days:   Implanted Port 09/13/17 Right Chest   09/13/17    1409    Chest   39   Gastrostomy/Enterostomy Gastrostomy 20 Fr. LUQ   09/13/17    1443    LUQ   39          Labs/Imaging Results for orders placed or performed during the hospital encounter of 10/22/17 (from the past 48 hour(s))  Comprehensive metabolic panel     Status: Abnormal   Collection Time: 10/22/17  4:21 PM  Result Value Ref Range   Sodium 133 (L) 135 - 145 mmol/L   Potassium 3.9 3.5 - 5.1 mmol/L   Chloride 95 (L) 101 - 111 mmol/L   CO2 29 22 - 32 mmol/L   Glucose, Bld 112 (H) 65 - 99 mg/dL   BUN 10 6 - 20 mg/dL   Creatinine, Ser 0.49 (L) 0.61 - 1.24 mg/dL   Calcium 9.4 8.9 - 10.3 mg/dL   Total Protein 6.8 6.5 - 8.1 g/dL   Albumin 3.2 (L) 3.5 - 5.0 g/dL   AST 19 15 - 41 U/L   ALT 17 17 - 63 U/L   Alkaline Phosphatase 65 38 - 126 U/L   Total  Bilirubin 0.7 0.3 - 1.2 mg/dL   GFR calc non Af Amer >60 >60 mL/min   GFR calc Af Amer >60 >60 mL/min    Comment: (NOTE) The eGFR has been calculated using the CKD EPI equation. This calculation has not been validated in all clinical situations. eGFR's persistently <60 mL/min signify possible Chronic Kidney Disease.    Anion gap 9 5 - 15  CBC WITH DIFFERENTIAL     Status: Abnormal   Collection Time: 10/22/17  4:21 PM  Result Value Ref Range   WBC 2.2 (L) 4.0 - 10.5 K/uL   RBC 3.44 (L) 4.22 - 5.81 MIL/uL   Hemoglobin 9.9 (L) 13.0 - 17.0 g/dL   HCT 29.5 (L) 39.0 - 52.0 %   MCV 85.8 78.0 - 100.0 fL   MCH 28.8 26.0 - 34.0 pg   MCHC 33.6 30.0 - 36.0 g/dL   RDW 13.6 11.5 - 15.5 %   Platelets 213 150 - 400 K/uL   Neutrophils Relative % 53 %   Neutro Abs 1.2 (L) 1.7 - 7.7 K/uL   Lymphocytes Relative 29 %   Lymphs Abs 0.7  0.7 - 4.0 K/uL   Monocytes Relative 17 %   Monocytes Absolute 0.4 0.1 - 1.0 K/uL   Eosinophils Relative 1 %   Eosinophils Absolute 0.0 0.0 - 0.7 K/uL   Basophils Relative 0 %   Basophils Absolute 0.0 0.0 - 0.1 K/uL  I-Stat CG4 Lactic Acid, ED  (not at  [facility])     Status: None   Collection Time: 10/22/17  4:36 PM  Result Value Ref Range   Lactic Acid, Venous 0.69 0.5 - 1.9 mmol/L  Rapid Strep Screen (Not at [facility])     Status: None   Collection Time: 10/22/17  4:54 PM  Result Value Ref Range   Streptococcus, Group A Screen (Direct) NEGATIVE NEGATIVE    Comment: (NOTE) A Rapid Antigen test may result negative if the antigen level in the sample is below the detection level of this test. The FDA has not cleared this test as a stand-alone test therefore the rapid antigen negative result has reflexed to a Group A Strep culture.   Influenza panel by PCR (type A & B)     Status: None   Collection Time: 10/22/17  4:54 PM  Result Value Ref Range   Influenza A By PCR NEGATIVE NEGATIVE   Influenza B By PCR NEGATIVE NEGATIVE    Comment: (NOTE) The Xpert Xpress Flu assay  is intended as an aid in the diagnosis of  influenza and should not be used as a sole basis for treatment.  This  assay is FDA approved for nasopharyngeal swab specimens only. Nasal  washings and aspirates are unacceptable for Xpert Xpress Flu testing.    Dg Chest 2 View  Result Date: 10/22/2017 CLINICAL DATA:  Head and neck cancer. EXAM: CHEST  2 VIEW COMPARISON:  10/06/2017 FINDINGS: Lungs are hyperexpanded. The lungs are clear without focal pneumonia, edema, pneumothorax or pleural effusion. The cardiopericardial silhouette is within normal limits for size. Right Port-A-Cath tip overlies the distal SVC. The visualized bony structures of the thorax are intact. Telemetry leads overlie the chest. IMPRESSION: Hyperexpansion without acute cardiopulmonary findings. Electronically Signed   By: [name] M.D.   On: 10/22/2017 18:27    Pending Labs Unresulted Labs (From admission, onward)   Start     Ordered   10/22/17 1654  Culture, group A strep  Once,   STAT     10/22/17 1654   10/22/17 1522  Blood Culture (routine x 2)  BLOOD CULTURE X 2,   STAT     10/22/17 1522   10/22/17 1522  Urinalysis, Routine w reflex microscopic  STAT,   STAT     10/22/17 1522   10/22/17 1522  Urine culture  STAT,   STAT     10/22/17 1522      Vitals/Pain Today's Vitals   10/22/17 1504 10/22/17 1505 10/22/17 1507 10/22/17 1826  BP:  110/73  114/87  Pulse:  80  77  Resp:  16  20  Temp:  (!) 100.4 F (38 C)    TempSrc:  Oral    SpO2:  100%  100%  Weight:  116 lb (52.6 kg)    Height:  '5\' 7"'$  (1.702 m)    PainSc: 8   8      Isolation Precautions No active isolations  Medications Medications  sodium chloride 0.9 % bolus 1,000 mL (not administered)  magic mouthwash w/lidocaine (15 mLs Oral Given 10/22/17 1708)  sodium chloride 0.9 % bolus 1,000 mL (0 mLs Intravenous Stopped 10/22/17 1825)  morphine 4 MG/ML injection 4 mg (4 mg Intravenous Given 10/22/17 1700)  acetaminophen (TYLENOL) tablet 650 mg (650 mg  Oral Given 10/22/17 1824)    Mobility Walks

## 2017-10-22 NOTE — H&P (Signed)
TRH H&P   Patient Demographics:    [name], is a 57 y.o. male  MRN: [id number]   DOB - 07-04-1961  Admit Date - 10/22/2017  Outpatient Primary MD for the patient is Pa, [place]  Referring MD/NP/PA: Reece Agar PA  Outpatient Specialists:    Dr. Lebron Conners (oncology) Dr. Isidore Moos (rad-onc)  Patient coming from: home  Chief Complaint  Patient presents with  . Fever  . Weakness      HPI:    [name]  is a 57 y.o. male, w left tonsil squamous cell carcinoma  ,  receiving Carboplatin and Taxol (finished last doses on 10/14/17) with concurrent radiation (last tx yesterday), who presents to the ED sent over from the cancer center for possible admission for neutropenic fever.  Pt notes that he has had a sore throat for the past 2 months.  Pt denies cough, n/v, abd pain, diarrhea, brbpr, dysuria.   Pt denies any sick contact, or body aches.  Pt was sent from cancer center for temp 100.8.    In ED,   Wbc 2.2, hgb 9.9, Plt 213 Na 133 K 3.9, Bun 10, creatinine 0.49 Ast 19, Alt 17, Alb 3.2 Rapid strep negative Influenza panel pending Blood culture x 2 pending Urinalysis pending  CXR negative  Pt will be admitted for fever , unclear etiology         Review of systems:    In addition to the HPI above,   No Headache, No changes with Vision or hearing, Slight pain with swallowing No Chest pain, Cough or Shortness of Breath, No Abdominal pain, No Nausea or Vommitting, Bowel movements are regular, No Blood in stool or Urine, No dysuria, No new skin rashes or bruises, No new joints pains-aches,  No new weakness, tingling, numbness in any extremity, No recent weight gain or loss, No polyuria, polydypsia or polyphagia, No significant Mental Stressors.  A full 10 point Review of Systems was done, except as stated above, all other Review of  Systems were negative.   With Past History of the following :    Past Medical History:  Diagnosis Date  . Cancer [facility]    left tonsil cancer      Past Surgical History:  Procedure Laterality Date  . IR FLUORO GUIDE PORT INSERTION RIGHT  09/13/2017  . IR GASTROSTOMY TUBE MOD SED  09/13/2017  . IR US GUIDE VASC ACCESS RIGHT  09/13/2017  . MULTIPLE EXTRACTIONS WITH ALVEOLOPLASTY N/A 08/09/2017   Procedure: Extraction of tooth #'s 1-6, 11-17,21,22,26-30 and 32 with alveoloplasty and bilateral mandibular tori reductions;  Surgeon: Lenn Cal, DDS;  Location: [facility];  Service: Oral Surgery;  Laterality: N/A;  . tonsil biopsy     left tonsil      Social History:     Social History   Tobacco Use  . Smoking status: Former  Smoker    Packs/day: 1.00    Years: 30.00    Pack years: 30.00    Types: Cigarettes  . Smokeless tobacco: Never Used  Substance Use Topics  . Alcohol use: Yes    Alcohol/week: 4.2 oz    Types: 7 Cans of beer per week    Comment: 1 pint daily      Lives - at home w friends  Mobility - walks by self   Family History :     Family History  Problem Relation Age of Onset  . Diabetes Mother       Home Medications:   Prior to Admission medications   Medication Sig Start Date End Date Taking? Authorizing Provider  acetaminophen (TYLENOL) 325 MG tablet Take 650 mg by mouth every 6 (six) hours as needed for moderate pain.    Yes [provider]  dexamethasone (DECADRON) 4 MG tablet Take 2 tablets (8 mg total) daily by mouth. Start the day after chemotherapy for 2 days. 09/01/17  Yes Ardath Sax, MD  ferrous sulfate 325 (65 FE) MG tablet Take 1 tablet (325 mg total) by mouth daily. 10/10/17 11/09/17 Yes Hosie Poisson, MD  fluconazole (DIFLUCAN) 200 MG tablet Take 200 mg by mouth daily. 09/23/17  Yes [provider]  ibuprofen (ADVIL,MOTRIN) 200 MG tablet Take 200 mg by mouth daily as needed (PAIN).   Yes [provider]  lidocaine (XYLOCAINE) 2 % solution Mix 1 part 2%viscous lidocaine,1part H2O.Swish and/or swallow 71mL of this mixture,40min before meals and at bedtime, up to QID 09/05/17  Yes Eppie Gibson, MD  LORazepam (ATIVAN) 0.5 MG tablet Take 1 tablet (0.5 mg total) every 6 (six) hours as needed by mouth (Nausea or vomiting). 08/31/17  Yes Ardath Sax, MD  Morphine Sulfate (MORPHINE CONCENTRATE) 10 mg / 0.5 ml concentrated solution Place 0.5 mLs (10 mg total) into feeding tube every 4 (four) hours as needed for severe pain. 09/15/17  Yes Ardath Sax, MD  Nutritional Supplements (FEEDING SUPPLEMENT, OSMOLITE 1.5 CAL,) LIQD Give 1 bottle Osmolite 1.5 via PEG 5 times daily with 60 mL free water before and after bolus. Drink or flush tube with additional 720 mL free water 3 times daily. 09/17/17  Yes Eppie Gibson, MD  oxyCODONE (OXY IR/ROXICODONE) 5 MG immediate release tablet 1 to 2 Q 4 hours prn pain 10/14/17  Yes Tanner, Lyndon Code., PA-C  Wound Dressings (SONAFINE) Apply 1 application topically 2 (two) times daily. 10/20/17  Yes Eppie Gibson, MD  lidocaine-prilocaine (EMLA) cream Apply to affected area once Patient not taking: Reported on 10/22/2017 09/01/17   Ardath Sax, MD     Allergies:    No Known Allergies   Physical Exam:   Vitals  Blood pressure 114/87, pulse 77, temperature (!) 100.4 F (38 C), temperature source Oral, resp. rate 20, height 5\' 7"  (1.702 m), weight 52.6 kg (116 lb), SpO2 100 %.   1. General lying in bed in NAD,   2. Normal affect and insight, Not Suicidal or Homicidal, Awake Alert, Oriented X 3.  3. No F.N deficits, ALL C.Nerves Intact, Strength 5/5 all 4 extremities, Sensation intact all 4 extremities, Plantars down going.  4. Ears and Eyes appear Normal, Conjunctivae clear, PERRLA. Moist Oral Mucosa.  + thrush,  Slight redness, no exudate, radiation scar on left side of neck  5. Supple Neck, No JVD, No cervical lymphadenopathy appriciated, No Carotid  Bruits.  6. Symmetrical Chest wall movement, Good air movement bilaterally,  CTAB.  7. RRR, No Gallops, Rubs or Murmurs, No Parasternal Heave.  8. Positive Bowel Sounds, Abdomen Soft, No tenderness, No organomegaly appriciated,No rebound -guarding or rigidity.  9.  No Cyanosis, Normal Skin Turgor, No Skin Rash or Bruise.  10. Good muscle tone,  joints appear normal , no effusions, Normal ROM.  11. No Palpable Lymph Nodes in Neck or Axillae  portacath right upper chest   Data Review:    CBC Recent Labs  Lab 10/22/17 1422 10/22/17 1621  WBC 2.0* 2.2*  HGB 10.0* 9.9*  HCT 30.1* 29.5*  PLT 216 213  MCV 87.0 85.8  MCH 28.9 28.8  MCHC 33.2 33.6  RDW 13.9 13.6  LYMPHSABS 0.2* 0.7  MONOABS 0.7 0.4  EOSABS 0.0 0.0  BASOSABS 0.0 0.0   ------------------------------------------------------------------------------------------------------------------  Chemistries  Recent Labs  Lab 10/22/17 1621  NA 133*  K 3.9  CL 95*  CO2 29  GLUCOSE 112*  BUN 10  CREATININE 0.49*  CALCIUM 9.4  AST 19  ALT 17  ALKPHOS 65  BILITOT 0.7   ------------------------------------------------------------------------------------------------------------------ estimated creatinine clearance is 77.6 mL/min (A) (by C-G formula based on SCr of 0.49 mg/dL (L)). ------------------------------------------------------------------------------------------------------------------ No results for input(s): TSH, T4TOTAL, T3FREE, THYROIDAB in the last 72 hours.  Invalid input(s): FREET3  Coagulation profile No results for input(s): INR, PROTIME in the last 168 hours. ------------------------------------------------------------------------------------------------------------------- No results for input(s): DDIMER in the last 72 hours. -------------------------------------------------------------------------------------------------------------------  Cardiac Enzymes No results for input(s): CKMB,  TROPONINI, MYOGLOBIN in the last 168 hours.  Invalid input(s): CK ------------------------------------------------------------------------------------------------------------------ No results found for: BNP   ---------------------------------------------------------------------------------------------------------------  Urinalysis    Component Value Date/Time   COLORURINE YELLOW 05/14/2011 1307   APPEARANCEUR CLEAR 05/14/2011 1307   LABSPEC 1.020 10/06/2017 1456   PHURINE 6.0 10/06/2017 1456   PHURINE 6.5 05/14/2011 1307   GLUCOSEU Negative 10/06/2017 1456   HGBUR Negative 10/06/2017 1456   HGBUR SMALL (A) 05/14/2011 1307   BILIRUBINUR Negative 10/06/2017 1456   KETONESUR Negative 10/06/2017 1456   KETONESUR NEGATIVE 05/14/2011 1307   PROTEINUR Negative 10/06/2017 1456   PROTEINUR NEGATIVE 05/14/2011 1307   UROBILINOGEN 0.2 10/06/2017 1456   NITRITE Negative 10/06/2017 1456   NITRITE NEGATIVE 05/14/2011 1307   LEUKOCYTESUR Negative 10/06/2017 1456    ----------------------------------------------------------------------------------------------------------------   Imaging Results:    Dg Chest 2 View  Result Date: 10/22/2017 CLINICAL DATA:  Head and neck cancer. EXAM: CHEST  2 VIEW COMPARISON:  10/06/2017 FINDINGS: Lungs are hyperexpanded. The lungs are clear without focal pneumonia, edema, pneumothorax or pleural effusion. The cardiopericardial silhouette is within normal limits for size. Right Port-A-Cath tip overlies the distal SVC. The visualized bony structures of the thorax are intact. Telemetry leads overlie the chest. IMPRESSION: Hyperexpansion without acute cardiopulmonary findings. Electronically Signed   By: Misty Stanley M.D.   On: 10/22/2017 18:27      Assessment & Plan:    Principal Problem:   Fever Active Problems:   Squamous cell carcinoma of left tonsil (HCC)   Hyponatremia   Anemia   Malnutrition of moderate degree   Leukopenia    Thrush    Fever Blood culture x2 Urinalysis vanco iv , cefepime iv pharmacy to dose  Leukopenia/anemia Check cbc with diff in am  Thrush Magic mouthwash 28ml swish and swallow q4h prn   Hyponatremia Hydrate with ns iv Check cmp in am  Protein calorie malnutrition, moderate prostat   DVT Prophylaxis  Lovenox - SCDs  AM Labs Ordered, also please review Full Orders  Family Communication: Admission, patients condition and plan of care including tests being ordered have been discussed with the patient who indicate understanding and agree with the plan and Code Status.  Code Status  FULL CODE  Likely DC to   home  Condition GUARDED   Consults called: none  Admission status:  observation   Time spent in minutes : 45   Jani Gravel M.D on 10/22/2017 at 7:00 PM  Between 7am to 7pm - Pager - [phone]   After 7pm go to [email or web address] - password [facility]  [facility] - Office  [phone]

## 2017-10-23 ENCOUNTER — Other Ambulatory Visit: Payer: Self-pay

## 2017-10-23 DIAGNOSIS — Z79899 Other long term (current) drug therapy: Secondary | ICD-10-CM | POA: Diagnosis not present

## 2017-10-23 DIAGNOSIS — J029 Acute pharyngitis, unspecified: Secondary | ICD-10-CM | POA: Diagnosis not present

## 2017-10-23 DIAGNOSIS — R509 Fever, unspecified: Secondary | ICD-10-CM | POA: Diagnosis not present

## 2017-10-23 DIAGNOSIS — R1310 Dysphagia, unspecified: Secondary | ICD-10-CM | POA: Diagnosis present

## 2017-10-23 DIAGNOSIS — K208 Other esophagitis: Secondary | ICD-10-CM | POA: Diagnosis present

## 2017-10-23 DIAGNOSIS — E43 Unspecified severe protein-calorie malnutrition: Secondary | ICD-10-CM | POA: Diagnosis present

## 2017-10-23 DIAGNOSIS — D72819 Decreased white blood cell count, unspecified: Secondary | ICD-10-CM | POA: Diagnosis not present

## 2017-10-23 DIAGNOSIS — E440 Moderate protein-calorie malnutrition: Secondary | ICD-10-CM | POA: Diagnosis not present

## 2017-10-23 DIAGNOSIS — Z7952 Long term (current) use of systemic steroids: Secondary | ICD-10-CM | POA: Diagnosis not present

## 2017-10-23 DIAGNOSIS — D709 Neutropenia, unspecified: Secondary | ICD-10-CM | POA: Diagnosis not present

## 2017-10-23 DIAGNOSIS — E8809 Other disorders of plasma-protein metabolism, not elsewhere classified: Secondary | ICD-10-CM | POA: Diagnosis not present

## 2017-10-23 DIAGNOSIS — D649 Anemia, unspecified: Secondary | ICD-10-CM | POA: Diagnosis not present

## 2017-10-23 DIAGNOSIS — B370 Candidal stomatitis: Secondary | ICD-10-CM | POA: Diagnosis present

## 2017-10-23 DIAGNOSIS — Z79891 Long term (current) use of opiate analgesic: Secondary | ICD-10-CM | POA: Diagnosis not present

## 2017-10-23 DIAGNOSIS — Z923 Personal history of irradiation: Secondary | ICD-10-CM | POA: Diagnosis not present

## 2017-10-23 DIAGNOSIS — C099 Malignant neoplasm of tonsil, unspecified: Secondary | ICD-10-CM | POA: Diagnosis present

## 2017-10-23 DIAGNOSIS — E871 Hypo-osmolality and hyponatremia: Secondary | ICD-10-CM | POA: Diagnosis present

## 2017-10-23 DIAGNOSIS — Z833 Family history of diabetes mellitus: Secondary | ICD-10-CM | POA: Diagnosis not present

## 2017-10-23 DIAGNOSIS — D638 Anemia in other chronic diseases classified elsewhere: Secondary | ICD-10-CM | POA: Diagnosis present

## 2017-10-23 DIAGNOSIS — D703 Neutropenia due to infection: Secondary | ICD-10-CM | POA: Diagnosis not present

## 2017-10-23 DIAGNOSIS — Z87891 Personal history of nicotine dependence: Secondary | ICD-10-CM | POA: Diagnosis not present

## 2017-10-23 DIAGNOSIS — Z791 Long term (current) use of non-steroidal anti-inflammatories (NSAID): Secondary | ICD-10-CM | POA: Diagnosis not present

## 2017-10-23 DIAGNOSIS — R5081 Fever presenting with conditions classified elsewhere: Secondary | ICD-10-CM | POA: Diagnosis present

## 2017-10-23 DIAGNOSIS — Z681 Body mass index (BMI) 19 or less, adult: Secondary | ICD-10-CM | POA: Diagnosis not present

## 2017-10-23 DIAGNOSIS — Z931 Gastrostomy status: Secondary | ICD-10-CM | POA: Diagnosis not present

## 2017-10-23 DIAGNOSIS — R05 Cough: Secondary | ICD-10-CM | POA: Diagnosis not present

## 2017-10-23 LAB — RESPIRATORY PANEL BY PCR
Adenovirus: NOT DETECTED
BORDETELLA PERTUSSIS-RVPCR: NOT DETECTED
CORONAVIRUS 229E-RVPPCR: NOT DETECTED
CORONAVIRUS HKU1-RVPPCR: NOT DETECTED
CORONAVIRUS OC43-RVPPCR: NOT DETECTED
Chlamydophila pneumoniae: NOT DETECTED
Coronavirus NL63: NOT DETECTED
Influenza A: NOT DETECTED
Influenza B: NOT DETECTED
MYCOPLASMA PNEUMONIAE-RVPPCR: NOT DETECTED
Metapneumovirus: NOT DETECTED
PARAINFLUENZA VIRUS 1-RVPPCR: NOT DETECTED
PARAINFLUENZA VIRUS 2-RVPPCR: NOT DETECTED
Parainfluenza Virus 3: NOT DETECTED
Parainfluenza Virus 4: NOT DETECTED
Respiratory Syncytial Virus: NOT DETECTED
Rhinovirus / Enterovirus: NOT DETECTED

## 2017-10-23 LAB — CBC WITH DIFFERENTIAL/PLATELET
BASOS ABS: 0 10*3/uL (ref 0.0–0.1)
BASOS PCT: 1 %
EOS PCT: 1 %
Eosinophils Absolute: 0 10*3/uL (ref 0.0–0.7)
HCT: 26.8 % — ABNORMAL LOW (ref 39.0–52.0)
Hemoglobin: 9 g/dL — ABNORMAL LOW (ref 13.0–17.0)
Lymphocytes Relative: 24 %
Lymphs Abs: 0.5 10*3/uL — ABNORMAL LOW (ref 0.7–4.0)
MCH: 29 pg (ref 26.0–34.0)
MCHC: 33.6 g/dL (ref 30.0–36.0)
MCV: 86.5 fL (ref 78.0–100.0)
MONO ABS: 0.4 10*3/uL (ref 0.1–1.0)
Monocytes Relative: 20 %
Neutro Abs: 1.1 10*3/uL — ABNORMAL LOW (ref 1.7–7.7)
Neutrophils Relative %: 54 %
PLATELETS: 181 10*3/uL (ref 150–400)
RBC: 3.1 MIL/uL — ABNORMAL LOW (ref 4.22–5.81)
RDW: 13.7 % (ref 11.5–15.5)
WBC: 2.1 10*3/uL — ABNORMAL LOW (ref 4.0–10.5)

## 2017-10-23 LAB — COMPREHENSIVE METABOLIC PANEL
ALBUMIN: 2.9 g/dL — AB (ref 3.5–5.0)
ALK PHOS: 54 U/L (ref 38–126)
ALT: 14 U/L — AB (ref 17–63)
AST: 17 U/L (ref 15–41)
Anion gap: 5 (ref 5–15)
BILIRUBIN TOTAL: 0.5 mg/dL (ref 0.3–1.2)
BUN: 9 mg/dL (ref 6–20)
CALCIUM: 8.6 mg/dL — AB (ref 8.9–10.3)
CO2: 29 mmol/L (ref 22–32)
CREATININE: 0.41 mg/dL — AB (ref 0.61–1.24)
Chloride: 100 mmol/L — ABNORMAL LOW (ref 101–111)
GFR calc Af Amer: >60 mL/min (ref >60)
GFR calc non Af Amer: >60 mL/min (ref >60)
GLUCOSE: 102 mg/dL — AB (ref 65–99)
Potassium: 3.7 mmol/L (ref 3.5–5.1)
Sodium: 134 mmol/L — ABNORMAL LOW (ref 135–145)
TOTAL PROTEIN: 6.1 g/dL — AB (ref 6.5–8.1)

## 2017-10-23 MED ORDER — ACETAMINOPHEN 160 MG/5ML PO SOLN
650.0000 mg | Freq: Four times a day (QID) | ORAL | Status: DC | PRN
  Administered 2017-10-23: 650 mg
  Filled 2017-10-23: qty 20.3

## 2017-10-23 MED ORDER — CHLORHEXIDINE GLUCONATE 0.12 % MT SOLN
15.0000 mL | Freq: Two times a day (BID) | OROMUCOSAL | Status: DC
  Administered 2017-10-23 – 2017-10-25 (×5): 15 mL via OROMUCOSAL
  Filled 2017-10-23 (×5): qty 15

## 2017-10-23 MED ORDER — PHENOL 1.4 % MT LIQD
1.0000 | OROMUCOSAL | Status: DC | PRN
  Filled 2017-10-23: qty 177

## 2017-10-23 MED ORDER — FLUCONAZOLE 40 MG/ML PO SUSR
400.0000 mg | Freq: Every day | ORAL | Status: DC
  Administered 2017-10-24 – 2017-10-25 (×2): 400 mg via ORAL
  Filled 2017-10-23 (×2): qty 10

## 2017-10-23 MED ORDER — OXYCODONE HCL 5 MG/5ML PO SOLN
5.0000 mg | ORAL | Status: DC | PRN
  Administered 2017-10-24: 5 mg via ORAL
  Filled 2017-10-23: qty 5

## 2017-10-23 MED ORDER — ORAL CARE MOUTH RINSE
15.0000 mL | Freq: Two times a day (BID) | OROMUCOSAL | Status: DC
  Administered 2017-10-23 – 2017-10-25 (×4): 15 mL via OROMUCOSAL

## 2017-10-23 NOTE — Progress Notes (Addendum)
PROGRESS NOTE [facility]   DADRIAN BALLANTINE   HWE:[id number] DOB: [birth year]  DOA: 10/22/2017 PCP: [facility]   Brief Narrative:  [name] is a 57 year old male with history of left tonsil squamous cell carcinoma, receiving radiotherapy last treatment on 10/22/2017, completed chemotherapy on 10/14/2017.  Patient was sent to the hospital from the cancer center after having a temperature of 100.8.  Upon ED evaluation WBC 2.2, hemoglobin 9.9 CXR with hyperexpansion nut no acute findings. Patient was admitted for neutropenic fever workup.  Patient was started empiric antibiotics.  Subjective: Patient seen and examined, complaining of sore throat and difficulty swallowing. Was febrile in AM 100.4. Denies chest pain, cough, dysuria, shortness of breath, nausea and vomiting.  Assessment & Plan: Neutropenic fever - ANC 1.1 No clear source of infection, although patient has oral candidiasis Will continue empiric antibiotics for now in addition of diflucan which I will increase to 400 mg daily in view of possible esophageal involvement. If blood cultures negative x 48 hrs d/c vanc  Continue neutropenic precautions  Follow up blood cultures  RVP pending  Rapid strep negative   Oral candidiasis with odynophagia  Differential include - radiation esophagitis (this will take time to heal) may need GI consult Continue Magic mouth, add phenol spray  Diflucan 400 mg daily   Left tonsil squamous cell carcinoma Follows Dr Lebron Conners as outpatient   Anemia of chronic diseases  Hgb stable  No overt bleeding  Continue to monitor   Protein calorie malnutrition moderate  Nutrition consult   DVT prophylaxis: Lovenox  Code Status: Full Code  Family Communication: None at bedside  Disposition Plan: Home when medically stable   Consultants:   None   Procedures:   None   Antimicrobials: Anti-infectives (From admission, onward)   Start     Dose/Rate Route Frequency Ordered Stop     10/23/17 0800  vancomycin (VANCOCIN) IVPB 750 mg/150 ml premix     750 mg 150 mL/hr over 60 Minutes Intravenous Every 12 hours 10/22/17 2039     10/22/17 2200  fluconazole (DIFLUCAN) tablet 200 mg  Status:  Discontinued     200 mg Oral Daily 10/22/17 2020 10/22/17 2145   10/22/17 2200  fluconazole (DIFLUCAN) 40 MG/ML suspension 200 mg     200 mg Oral Daily 10/22/17 2145     10/22/17 2045  ceFEPIme (MAXIPIME) 2 g in dextrose 5 % 50 mL IVPB     2 g 100 mL/hr over 30 Minutes Intravenous Every 8 hours 10/22/17 2037     10/22/17 2045  vancomycin (VANCOCIN) IVPB 750 mg/150 ml premix     750 mg 150 mL/hr over 60 Minutes Intravenous  Once 10/22/17 2039 10/22/17 2248           Objective: Vitals:   10/22/17 1505 10/22/17 1826 10/22/17 2006 10/23/17 0425  BP: 110/73 114/87 119/70 108/70  Pulse: 80 77 72 80  Resp: 16 20 18 16   Temp: (!) 100.4 F (38 C)  99.2 F (37.3 C) 99.1 F (37.3 C)  TempSrc: Oral  Oral Oral  SpO2: 100% 100% 100% 100%  Weight: 52.6 kg (116 lb)  53.1 kg (116 lb 15.8 oz) 53.1 kg (116 lb 15.8 oz)  Height: 5\' 7"  (1.702 m)  5\' 7"  (1.702 m)     Intake/Output Summary (Last 24 hours) at 10/23/2017 1138 Last data filed at 10/23/2017 0629 Gross per 24 hour  Intake 1000 ml  Output 800 ml  Net 200 ml  Filed Weights   10/22/17 1505 10/22/17 2006 10/23/17 0425  Weight: 52.6 kg (116 lb) 53.1 kg (116 lb 15.8 oz) 53.1 kg (116 lb 15.8 oz)    Examination:  General exam: Thin frail, NAD  HEENT: Oral thrush with throat involvement. Mucosal moist  Respiratory system: Clear to auscultation. No wheezes,crackle or rhonchi Cardiovascular system: S1 & S2 heard, RRR. No JVD, murmurs, rubs or gallops Gastrointestinal system: Abdomen is nondistended, soft and nontender. No organomegaly or masses felt. Normal bowel sounds heard. Central nervous system: Alert and oriented. No focal neurological deficits. Extremities: No pedal edema.  Skin: No rashes, lesions or ulcers Psychiatry:   Mood & affect appropriate.    Data Reviewed: I have personally reviewed following labs and imaging studies  CBC: Recent Labs  Lab 10/22/17 1422 10/22/17 1621 10/23/17 0429  WBC 2.0* 2.2* 2.1*  NEUTROABS 1.1* 1.2* 1.1*  HGB 10.0* 9.9* 9.0*  HCT 30.1* 29.5* 26.8*  MCV 87.0 85.8 86.5  PLT 216 213 782   Basic Metabolic Panel: Recent Labs  Lab 10/22/17 1621 10/23/17 0429  NA 133* 134*  K 3.9 3.7  CL 95* 100*  CO2 29 29  GLUCOSE 112* 102*  BUN 10 9  CREATININE 0.49* 0.41*  CALCIUM 9.4 8.6*   GFR: Estimated Creatinine Clearance: 78.4 mL/min (A) (by C-G formula based on SCr of 0.41 mg/dL (L)). Liver Function Tests: Recent Labs  Lab 10/22/17 1621 10/23/17 0429  AST 19 17  ALT 17 14*  ALKPHOS 65 54  BILITOT 0.7 0.5  PROT 6.8 6.1*  ALBUMIN 3.2* 2.9*   No results for input(s): LIPASE, AMYLASE in the last 168 hours. No results for input(s): AMMONIA in the last 168 hours. Coagulation Profile: No results for input(s): INR, PROTIME in the last 168 hours. Cardiac Enzymes: No results for input(s): CKTOTAL, CKMB, CKMBINDEX, TROPONINI in the last 168 hours. BNP (last 3 results) No results for input(s): PROBNP in the last 8760 hours. HbA1C: No results for input(s): HGBA1C in the last 72 hours. CBG: No results for input(s): GLUCAP in the last 168 hours. Lipid Profile: No results for input(s): CHOL, HDL, LDLCALC, TRIG, CHOLHDL, LDLDIRECT in the last 72 hours. Thyroid Function Tests: No results for input(s): TSH, T4TOTAL, FREET4, T3FREE, THYROIDAB in the last 72 hours. Anemia Panel: No results for input(s): VITAMINB12, FOLATE, FERRITIN, TIBC, IRON, RETICCTPCT in the last 72 hours. Sepsis Labs: Recent Labs  Lab 10/22/17 1636  LATICACIDVEN 0.69    Recent Results (from the past 240 hour(s))  Blood Culture (routine x 2)     Status: None (Preliminary result)   Collection Time: 10/22/17  4:22 PM  Result Value Ref Range Status   Specimen Description BLOOD PORTA CATH  Final    Special Requests   Final    BOTTLES DRAWN AEROBIC AND ANAEROBIC Blood Culture adequate volume   Culture   Final    NO GROWTH < 12 HOURS Performed at [facility], [place] [address] [place], [place] [address]    Report Status PENDING  Incomplete  Rapid Strep Screen (Not at [facility])     Status: None   Collection Time: 10/22/17  4:54 PM  Result Value Ref Range Status   Streptococcus, Group A Screen (Direct) NEGATIVE NEGATIVE Final    Comment: (NOTE) A Rapid Antigen test may result negative if the antigen level in the sample is below the detection level of this test. The FDA has not cleared this test as a stand-alone test therefore the rapid antigen  negative result has reflexed to a Group A Strep culture.   Blood Culture (routine x 2)     Status: None (Preliminary result)   Collection Time: 10/22/17  8:14 PM  Result Value Ref Range Status   Specimen Description BLOOD LEFT ARM  Final   Special Requests   Final    BOTTLES DRAWN AEROBIC AND ANAEROBIC Blood Culture adequate volume   Culture   Final    NO GROWTH < 12 HOURS Performed at [facility], [address], [place], [place] [address]    Report Status PENDING  Incomplete      Radiology Studies: Dg Chest 2 View  Result Date: 10/22/2017 CLINICAL DATA:  Head and neck cancer. EXAM: CHEST  2 VIEW COMPARISON:  10/06/2017 FINDINGS: Lungs are hyperexpanded. The lungs are clear without focal pneumonia, edema, pneumothorax or pleural effusion. The cardiopericardial silhouette is within normal limits for size. Right Port-A-Cath tip overlies the distal SVC. The visualized bony structures of the thorax are intact. Telemetry leads overlie the chest. IMPRESSION: Hyperexpansion without acute cardiopulmonary findings. Electronically Signed   By: Misty Stanley M.D.   On: 10/22/2017 18:27      Scheduled Meds: . chlorhexidine  15 mL Mouth Rinse BID  . enoxaparin (LOVENOX) injection  40 mg Subcutaneous Q24H  . feeding supplement (OSMOLITE  1.5 CAL)  237 mL Per Tube 5 X Daily  . feeding supplement (PRO-STAT SUGAR FREE 64)  30 mL Oral BID  . ferrous sulfate  300 mg Oral Daily  . fluconazole  200 mg Oral Daily  . lidocaine  10 mL Mouth/Throat TID AC & HS  . magic mouthwash  5 mL Oral QID  . mouth rinse  15 mL Mouth Rinse q12n4p  . SONAFINE  1 application Topical BID   Continuous Infusions: . sodium chloride 75 mL/hr at 10/23/17 1048  . ceFEPime (MAXIPIME) IV 2 g (10/23/17 0629)  . sodium chloride    . vancomycin 750 mg (10/23/17 1050)     LOS: 0 days    Time spent: Total of 35 minutes spent with pt, greater than 50% of which was spent in discussion of  treatment, counseling and coordination of care    Chipper Oman, MD Pager: Text Page via [email or web address]   If 7PM-7AM, please contact night-coverage [email or web address] 10/23/2017, 11:38 AM

## 2017-10-23 NOTE — Assessment & Plan Note (Signed)
57 y.o. with left tonsillar carcinoma.  Imaging demonstrated locally advanced disease without evidence of metastatic disease in the lymph nodes by PET/CT.  No evidence of distal metastasis. Patient underwent dental evaluation and surgery due to poor dentition.    Subsequently, patient was started on systemic chemotherapy with weekly carboplatin and paclitaxel administered concurrently with radiation.  Tolerating treatment well so far without unexpected or intolerable toxicity.  Appears to have developed oral and possible esophageal thrush based on exam and symptoms of dysphasia/odynophagia.  Remainder of clinical evaluation and labs today are permissive to proceed with the third week of systemic therapy.   Plan:  --Proceed with the 4th week of systemic chemotherapy --Return to my clinic in 1 week for labs, toxicity monitoring, and continuation of systemic therapy

## 2017-10-23 NOTE — Progress Notes (Signed)
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  Imaging demonstrated locally advanced disease without evidence of metastatic disease in the lymph nodes by PET/CT.  No evidence of distal metastasis. Patient underwent dental evaluation and surgery due to poor dentition.    Subsequently, patient was started on systemic chemotherapy with weekly carboplatin and paclitaxel administered concurrently with radiation.  Tolerating treatment well so far without unexpected or intolerable toxicity.  Appears to have developed oral and possible esophageal thrush based on exam and symptoms of dysphasia/odynophagia.  Remainder of clinical evaluation and labs today are permissive to proceed with the third week of systemic therapy.   Plan:  --Proceed with the 4th week of systemic chemotherapy --Return to my clinic in 1 week for labs, toxicity monitoring, and continuation of systemic therapy  Voice recognition software was used and creation of this note. Despite my best effort at editing the text, some misspelling/errors may have occurred.  Orders Placed This Encounter  Procedures  . CBC with Differential    Standing Status:   Future    Number of Occurrences:   1    Standing Expiration Date:   09/21/2018  . Comprehensive metabolic panel    Standing Status:   Future    Number of Occurrences:   1    Standing Expiration Date:   09/21/2018  . Magnesium    Standing Status:   Future    Number of Occurrences:   1    Standing Expiration Date:   09/21/2018    Cancer Staging Squamous cell carcinoma of left tonsil (HCC) Staging form: Pharynx - P16 Negative Oropharynx, AJCC 8th Edition - Clinical stage from 08/06/2017: Stage II (cT2, cN0, cM0, p16: Negative) - Signed by Durwin Davisson G, MD on 09/01/2017   All questions were answered. . The patient knows to call the clinic with any problems, questions or  concerns.  This note was electronically signed.    History of Presenting Illness [name] is a 57 y.o. male followed in the Cancer Center for diagnosis of squamous cell carcinoma of the left tonsil. Patient initially presented with sore throat onset around the middle of June. He first presented to the Urgent Care on 04/26/17 after 3 weeks of symptoms. The sore throat was also accompanied by the globus sensation in the neck. He was treated for uvulitis initially, wihtout significant improvement despite two additional visits to the Urgent Care. On his last visit with the Urgent CareENT referral was made.  The patient saw Dr. Byers from [facility] and underwent laryngoscopy on 07/16/17 with biopsy which confirmed presence of squamous cell carcinoma. Please see results of additional evaluation in the oncological history below. At the present time, additional assessment is pending. Patient was seen by dentistry and multiple dental extractions were recommended. He underwent surgical removal of his teeth on 08/09/17.    Patient returns to the clinic to continue systemic therapy with carboplatin and paclitaxel administered concurrently with radiation therapy.  Complaints today include increased pain in the mouth and throat.  No interval fever.  No evidence of neuropathy.  Oncological/hematological History:   Squamous cell carcinoma of left tonsil (HCC)   07/16/2017 Initial Diagnosis    Squamous cell carcinoma of left tonsil (HCC)      07/16/2017 Pathology Results    Diagnosis: Tonsil, biopsy, left mass -- SQUAMOUS CELL CARCINOMA; The biopsy has at least squamous cell carcinoma in-situ. There   are foci suspicious but not definitive for invasion. p16 immunohistochemistry is negative in the neoplastic cells. FINAL DIAGNOSIS       07/27/2017 Imaging    CT neck: Indistinct tonsillar enlargement on the left in the region of left tonsillar mass biopsy. This could be a combination of  post biopsy edema and residual mass. Margins are not discrete and accurate measurement cannot be performed. This is estimated at about 3 cm in size. No evidence of metastatic adenopathy.      08/16/2017 Imaging    PET-CT: Hypermetabolic disease in the left palatal fossa.  Indeterminate level 3 lymph node which may be reactive considering recent dental extractions.      09/01/2017 -  Radiation Therapy         09/01/2017 -  Chemotherapy    Carbboplatin AUC2,d1 + Paclitaxel 3m/m2, d1 Q7d --Week #1, 132/20/25 Complicated by some burning/soreness in the throat. --Week #2, 09/08/17: --Week #3, 09/15/17: --Week #4, 09/22/17:      09/13/2017 Procedure    Successful fluoroscopic insertion of a 20-French pull-through gastrostomy tube. Successful placement of a right internal jugular approach power injectable Port-A-Cath.       Medical History: Past Medical History:  Diagnosis Date  . Cancer ([facility]    left tonsil cancer    Surgical History: Past Surgical History:  Procedure Laterality Date  . IR FLUORO GUIDE PORT INSERTION RIGHT  09/13/2017  . IR GASTROSTOMY TUBE MOD SED  09/13/2017  . IR [place]IDE VASC ACCESS RIGHT  09/13/2017  . MULTIPLE EXTRACTIONS WITH ALVEOLOPLASTY N/A 08/09/2017   Procedure: Extraction of tooth #'s 1-6, 11-17,21,22,26-30 and 32 with alveoloplasty and bilateral mandibular tori reductions;  Surgeon: KLenn Cal DDS;  Location: [facility];  Service: Oral Surgery;  Laterality: N/A;  . tonsil biopsy     left tonsil    Family History: Family History  Problem Relation Age of Onset  . Diabetes Mother     Social History: Social History   Socioeconomic History  . Marital status: Single    Spouse name: Not on file  . Number of children: 1  . Years of education: Not on file  . Highest education level: Not on file  Social Needs  . Financial resource strain: Not on file  . Food insecurity - worry: Not on file  . Food insecurity - inability: Not on file   . Transportation needs - medical: Not on file  . Transportation needs - non-medical: Not on file  Occupational History  . Occupation: Disabled  Tobacco Use  . Smoking status: Former Smoker    Packs/day: 1.00    Years: 30.00    Pack years: 30.00    Types: Cigarettes  . Smokeless tobacco: Never Used  Substance and Sexual Activity  . Alcohol use: Yes    Alcohol/week: 4.2 oz    Types: 7 Cans of beer per week    Comment: 1 pint daily   . Drug use: No    Comment: he has a past history of cocaine abuse, he denies current use  . Sexual activity: Not on file  Other Topics Concern  . Not on file  Social History Narrative  . Not on file    Allergies: No Known Allergies  Medications:  No current facility-administered medications for this visit.    No current outpatient medications on file.   Facility-Administered Medications Ordered in Other Visits  Medication Dose Route Frequency Provider Last Rate Last Dose  . 0.9 %  sodium chloride infusion  Intravenous Continuous Jani Gravel, MD 75 mL/hr at 10/23/17 1048    . acetaminophen (TYLENOL) tablet 650 mg  650 mg Oral Q6H PRN Jani Gravel, MD       Or  . acetaminophen (TYLENOL) suppository 650 mg  650 mg Rectal Q6H PRN Jani Gravel, MD      . ceFEPIme (MAXIPIME) 2 g in dextrose 5 % 50 mL IVPB  2 g Intravenous Lysle Dingwall, MD 100 mL/hr at 10/23/17 0629 2 g at 10/23/17 0629  . chlorhexidine (PERIDEX) 0.12 % solution 15 mL  15 mL Mouth Rinse BID Jani Gravel, MD   15 mL at 10/23/17 1049  . enoxaparin (LOVENOX) injection 40 mg  40 mg Subcutaneous Q24H Jani Gravel, MD      . feeding supplement (OSMOLITE 1.5 CAL) liquid 237 mL  237 mL Per Tube 5 X Daily Jani Gravel, MD   237 mL at 10/23/17 1054  . feeding supplement (PRO-STAT SUGAR FREE 64) liquid 30 mL  30 mL Oral BID Jani Gravel, MD   30 mL at 10/23/17 1050  . ferrous sulfate 300 (60 Fe) MG/5ML syrup 300 mg  300 mg Oral Daily Bodenheimer, Charles A, NP   300 mg at 10/23/17 1050  . fluconazole  (DIFLUCAN) 40 MG/ML suspension 200 mg  200 mg Oral Daily Bodenheimer, Charles A, NP   200 mg at 10/23/17 1050  . ibuprofen (ADVIL,MOTRIN) tablet 200 mg  200 mg Oral Daily PRN Jani Gravel, MD      . lidocaine (XYLOCAINE) 2 % viscous mouth solution 10 mL  10 mL Mouth/Throat TID AC & HS Jani Gravel, MD   10 mL at 10/23/17 1050  . LORazepam (ATIVAN) injection 0.5 mg  0.5 mg Intravenous Q6H PRN Bodenheimer, Charles A, NP      . magic mouthwash  5 mL Oral QID Jani Gravel, MD   5 mL at 10/23/17 1050  . MEDLINE mouth rinse  15 mL Mouth Rinse q12n4p Jani Gravel, MD      . morphine CONCENTRATE 10 MG/0.5ML oral solution 10 mg  10 mg Per Tube Q4H PRN Jani Gravel, MD   10 mg at 10/22/17 2133  . oxyCODONE (ROXICODONE) 5 MG/5ML solution 5 mg  5 mg Oral Q4H PRN Bodenheimer, Charles A, NP      . sodium chloride 0.9 % bolus 1,000 mL  1,000 mL Intravenous Once Street, [place], PA-C      . SONAFINE emulsion 1 application  1 application Topical BID Jani Gravel, MD   1 application at 35/57/32 1050  . vancomycin (VANCOCIN) IVPB 750 mg/150 ml premix  750 mg Intravenous Marjean Donna, MD 150 mL/hr at 10/23/17 1050 750 mg at 10/23/17 1050    Review of Systems: Review of Systems  HENT:   Positive for sore throat and trouble swallowing.   All other systems reviewed and are negative.    PHYSICAL EXAMINATION Blood pressure 102/64, pulse 67, temperature 98.7 F (37.1 C), temperature source Oral, resp. rate 16, weight 124 lb 9.6 oz (56.5 kg), SpO2 100 %.  ECOG PERFORMANCE STATUS: 1 - Symptomatic but completely ambulatory  Physical Exam  Constitutional: He is oriented to person, place, and time and well-developed, well-nourished, and in no distress. No distress.  HENT:  Head: Normocephalic and atraumatic.  Mouth/Throat: Uvula is midline and mucous membranes are normal. He does not have dentures. Oral lesions present. Abnormal dentition. Dental caries present. No dental abscesses or uvula swelling. Oropharyngeal exudate  present. No tonsillar abscesses.  New  white patches noted in the mouth consistent with thrush  Eyes: Conjunctivae and EOM are normal. Pupils are equal, round, and reactive to light. No scleral icterus.  Neck: No thyromegaly present.  Cardiovascular: Normal rate, regular rhythm, normal heart sounds and intact distal pulses.  No murmur heard. Pulmonary/Chest: Effort normal and breath sounds normal. No respiratory distress. He has no wheezes. He has no rales.  Abdominal: Soft. Bowel sounds are normal. He exhibits no distension and no mass. There is no tenderness. There is no rebound and no guarding.  Musculoskeletal: Normal range of motion. He exhibits no edema.  Lymphadenopathy:    He has no cervical adenopathy.  Neurological: He is alert and oriented to person, place, and time. He has normal reflexes. No cranial nerve deficit. Coordination normal.  Skin: Skin is warm and dry. No rash noted. He is not diaphoretic. No erythema.     LABORATORY DATA: I have personally reviewed the data as listed: Appointment on 09/21/2017  Component Date Value Ref Range Status  . Phosphorus, Ser 09/21/2017 3.7  2.5 - 4.5 mg/dL Final  . WBC 09/21/2017 2.4* 4.0 - 10.3 10e3/uL Final  . NEUT# 09/21/2017 1.5  1.5 - 6.5 10e3/uL Final  . HGB 09/21/2017 11.0* 13.0 - 17.1 g/dL Final  . HCT 09/21/2017 33.3* 38.4 - 49.9 % Final  . Platelets 09/21/2017 158  140 - 400 10e3/uL Final  . MCV 09/21/2017 87.5  79.3 - 98.0 fL Final  . MCH 09/21/2017 29.0  27.2 - 33.4 pg Final  . MCHC 09/21/2017 33.1  32.0 - 36.0 g/dL Final  . RBC 09/21/2017 3.81* 4.20 - 5.82 10e6/uL Final  . RDW 09/21/2017 13.7  11.0 - 14.6 % Final  . lymph# 09/21/2017 0.5* 0.9 - 3.3 10e3/uL Final  . MONO# 09/21/2017 0.3  0.1 - 0.9 10e3/uL Final  . Eosinophils Absolute 09/21/2017 0.0  0.0 - 0.5 10e3/uL Final  . Basophils Absolute 09/21/2017 0.0  0.0 - 0.1 10e3/uL Final  . NEUT% 09/21/2017 61.0  39.0 - 75.0 % Final  . LYMPH% 09/21/2017 21.6  14.0 - 49.0 %  Final  . MONO% 09/21/2017 14.5* 0.0 - 14.0 % Final  . EOS% 09/21/2017 2.1  0.0 - 7.0 % Final  . BASO% 09/21/2017 0.8  0.0 - 2.0 % Final  . Sodium 09/21/2017 136  136 - 145 mEq/L Final  . Potassium 09/21/2017 4.1  3.5 - 5.1 mEq/L Final  . Chloride 09/21/2017 99  98 - 109 mEq/L Final  . CO2 09/21/2017 29  22 - 29 mEq/L Final  . Glucose 09/21/2017 99  70 - 140 mg/dl Final   Glucose reference range is for nonfasting patients. Fasting glucose reference range is 70- 100.  Marland Kitchen BUN 09/21/2017 10.2  7.0 - 26.0 mg/dL Final  . Creatinine 09/21/2017 0.7  0.7 - 1.3 mg/dL Final  . Total Bilirubin 09/21/2017 0.26  0.20 - 1.20 mg/dL Final  . Alkaline Phosphatase 09/21/2017 70  40 - 150 U/L Final  . AST 09/21/2017 15  5 - 34 U/L Final  . ALT 09/21/2017 9  0 - 55 U/L Final  . Total Protein 09/21/2017 7.0  6.4 - 8.3 g/dL Final  . Albumin 09/21/2017 3.5  3.5 - 5.0 g/dL Final  . Calcium 09/21/2017 9.3  8.4 - 10.4 mg/dL Final  . Anion Gap 09/21/2017 8  3 - 11 mEq/L Final  . EGFR 09/21/2017 >60  >60 ml/min/1.73 m2 Final   eGFR is calculated using the CKD-EPI Creatinine Equation (2009)  .  Magnesium 09/21/2017 1.7  1.5 - 2.5 mg/dl Final       Mikhail G Perlov, MD 

## 2017-10-24 DIAGNOSIS — E8809 Other disorders of plasma-protein metabolism, not elsewhere classified: Secondary | ICD-10-CM

## 2017-10-24 DIAGNOSIS — R05 Cough: Secondary | ICD-10-CM

## 2017-10-24 DIAGNOSIS — J029 Acute pharyngitis, unspecified: Secondary | ICD-10-CM

## 2017-10-24 LAB — CBC WITH DIFFERENTIAL/PLATELET
Basophils Absolute: 0 10*3/uL (ref 0.0–0.1)
Basophils Relative: 0 %
Eosinophils Absolute: 0 10*3/uL (ref 0.0–0.7)
Eosinophils Relative: 1 %
HCT: 26.8 % — ABNORMAL LOW (ref 39.0–52.0)
Hemoglobin: 9 g/dL — ABNORMAL LOW (ref 13.0–17.0)
Lymphocytes Relative: 16 %
Lymphs Abs: 0.5 10*3/uL — ABNORMAL LOW (ref 0.7–4.0)
MCH: 28.6 pg (ref 26.0–34.0)
MCHC: 33.6 g/dL (ref 30.0–36.0)
MCV: 85.1 fL (ref 78.0–100.0)
Monocytes Absolute: 0.5 10*3/uL (ref 0.1–1.0)
Monocytes Relative: 15 %
Neutro Abs: 2 10*3/uL (ref 1.7–7.7)
Neutrophils Relative %: 68 %
Platelets: 185 10*3/uL (ref 150–400)
RBC: 3.15 MIL/uL — ABNORMAL LOW (ref 4.22–5.81)
RDW: 13.7 % (ref 11.5–15.5)
WBC: 2.9 10*3/uL — ABNORMAL LOW (ref 4.0–10.5)

## 2017-10-24 LAB — URINE CULTURE: CULTURE: NO GROWTH

## 2017-10-24 LAB — BASIC METABOLIC PANEL
Anion gap: 6 (ref 5–15)
BUN: 9 mg/dL (ref 6–20)
CO2: 31 mmol/L (ref 22–32)
Calcium: 8.9 mg/dL (ref 8.9–10.3)
Chloride: 96 mmol/L — ABNORMAL LOW (ref 101–111)
Creatinine, Ser: 0.41 mg/dL — ABNORMAL LOW (ref 0.61–1.24)
GFR calc Af Amer: >60 mL/min (ref >60)
GFR calc non Af Amer: >60 mL/min (ref >60)
Glucose, Bld: 116 mg/dL — ABNORMAL HIGH (ref 65–99)
Potassium: 4 mmol/L (ref 3.5–5.1)
Sodium: 133 mmol/L — ABNORMAL LOW (ref 135–145)

## 2017-10-24 NOTE — Progress Notes (Signed)
PROGRESS NOTE [facility]   [name]   QIH:[id number] DOB: November 04, 1960  DOA: 10/22/2017 PCP: [facility]   Brief Narrative:  [name] is a 57 year old male with history of left tonsil squamous cell carcinoma, receiving radiotherapy last treatment on 10/22/2017, completed chemotherapy on 10/14/2017.  Patient was sent to the hospital from the cancer center after having a temperature of 100.8.  Upon ED evaluation WBC 2.2, hemoglobin 9.9 CXR with hyperexpansion nut no acute findings. Patient was admitted for neutropenic fever workup.  Patient was started empiric antibiotics.  Subjective: No acute events reported overnight. Tmax 99.1 overnight. WBCs trending up. Reports odynophagia. On po fluconazole and mouthwash.   Assessment & Plan: Neutropenic fever - ANC 1.1 No clear source of infection, although patient has oral candidiasis Will continue empiric antibiotics for now in addition of diflucan which I will increase to 400 mg daily in view of possible esophageal involvement. blood cultures negative x 48 hrs d/c vanc today 10/24/17 Continue neutropenic precautions  RVP negative Rapid strep negative   Oral candidiasis with odynophagia  Differential include - radiation esophagitis (this will take time to heal) may need GI consult Continue Magic mouth, phenol spray  Diflucan 400 mg daily   Left tonsil squamous cell carcinoma Follows Dr Lebron Conners as outpatient   Anemia of chronic diseases  Hgb stable  No overt bleeding  Continue to monitor   Protein calorie malnutrition moderate  Nutrition consult   DVT prophylaxis: Lovenox sq daily Code Status: Full Code  Family Communication: None at bedside  Disposition Plan: Home when medically stable   Consultants:   None   Procedures:   None   Antimicrobials: Anti-infectives (From admission, onward)   Start     Dose/Rate Route Frequency Ordered Stop   10/24/17 1000  fluconazole (DIFLUCAN) 40 MG/ML suspension 400  mg     400 mg Oral Daily 10/23/17 1524     10/23/17 0800  vancomycin (VANCOCIN) IVPB 750 mg/150 ml premix     750 mg 150 mL/hr over 60 Minutes Intravenous Every 12 hours 10/22/17 2039     10/22/17 2200  fluconazole (DIFLUCAN) tablet 200 mg  Status:  Discontinued     200 mg Oral Daily 10/22/17 2020 10/22/17 2145   10/22/17 2200  fluconazole (DIFLUCAN) 40 MG/ML suspension 200 mg  Status:  Discontinued     200 mg Oral Daily 10/22/17 2145 10/23/17 1524   10/22/17 2045  ceFEPIme (MAXIPIME) 2 g in dextrose 5 % 50 mL IVPB     2 g 100 mL/hr over 30 Minutes Intravenous Every 8 hours 10/22/17 2037     10/22/17 2045  vancomycin (VANCOCIN) IVPB 750 mg/150 ml premix     750 mg 150 mL/hr over 60 Minutes Intravenous  Once 10/22/17 2039 10/22/17 2248       Objective: Vitals:   10/23/17 0425 10/23/17 1414 10/23/17 2116 10/24/17 0428  BP: 108/70 113/74 134/73 104/72  Pulse: 80 79 71 72  Resp: 16 16 18 16   Temp: 99.1 F (37.3 C) 99.6 F (37.6 C) 98.9 F (37.2 C) 99.1 F (37.3 C)  TempSrc: Oral Oral Oral Oral  SpO2: 100% 100% 100% 100%  Weight: 53.1 kg (116 lb 15.8 oz)   55.7 kg (122 lb 12.7 oz)  Height:        Intake/Output Summary (Last 24 hours) at 10/24/2017 0742 Last data filed at 10/24/2017 0704 Gross per 24 hour  Intake 2521.5 ml  Output 1250 ml  Net 1271.5 ml  Filed Weights   10/22/17 2006 10/23/17 0425 10/24/17 0428  Weight: 53.1 kg (116 lb 15.8 oz) 53.1 kg (116 lb 15.8 oz) 55.7 kg (122 lb 12.7 oz)    Examination:  General exam: Thin frail, NAD  HEENT: Oral thrush with throat involvement.  Respiratory system: Clear to auscultation. No wheezes,crackle or rhonchi Cardiovascular system: S1 & S2 heard, RRR. No JVD, murmurs, rubs or gallops Gastrointestinal system: Abdomen is nondistended, soft and nontender. Peg tube in place. Normal bowel sounds heard. Central nervous system: Alert and oriented. No focal neurological deficits. Extremities: No pedal edema.  Skin: No rashes,  lesions or ulcers Psychiatry:  Mood & affect appropriate.    Data Reviewed: I have personally reviewed following labs and imaging studies  CBC: Recent Labs  Lab 10/22/17 1422 10/22/17 1621 10/23/17 0429 10/24/17 0700  WBC 2.0* 2.2* 2.1* 2.9*  NEUTROABS 1.1* 1.2* 1.1* 2.0  HGB 10.0* 9.9* 9.0* 9.0*  HCT 30.1* 29.5* 26.8* 26.8*  MCV 87.0 85.8 86.5 85.1  PLT 216 213 181 025   Basic Metabolic Panel: Recent Labs  Lab 10/22/17 1621 10/23/17 0429  NA 133* 134*  K 3.9 3.7  CL 95* 100*  CO2 29 29  GLUCOSE 112* 102*  BUN 10 9  CREATININE 0.49* 0.41*  CALCIUM 9.4 8.6*   GFR: Estimated Creatinine Clearance: 82.2 mL/min (A) (by C-G formula based on SCr of 0.41 mg/dL (L)). Liver Function Tests: Recent Labs  Lab 10/22/17 1621 10/23/17 0429  AST 19 17  ALT 17 14*  ALKPHOS 65 54  BILITOT 0.7 0.5  PROT 6.8 6.1*  ALBUMIN 3.2* 2.9*   No results for input(s): LIPASE, AMYLASE in the last 168 hours. No results for input(s): AMMONIA in the last 168 hours. Coagulation Profile: No results for input(s): INR, PROTIME in the last 168 hours. Cardiac Enzymes: No results for input(s): CKTOTAL, CKMB, CKMBINDEX, TROPONINI in the last 168 hours. BNP (last 3 results) No results for input(s): PROBNP in the last 8760 hours. HbA1C: No results for input(s): HGBA1C in the last 72 hours. CBG: No results for input(s): GLUCAP in the last 168 hours. Lipid Profile: No results for input(s): CHOL, HDL, LDLCALC, TRIG, CHOLHDL, LDLDIRECT in the last 72 hours. Thyroid Function Tests: No results for input(s): TSH, T4TOTAL, FREET4, T3FREE, THYROIDAB in the last 72 hours. Anemia Panel: No results for input(s): VITAMINB12, FOLATE, FERRITIN, TIBC, IRON, RETICCTPCT in the last 72 hours. Sepsis Labs: Recent Labs  Lab 10/22/17 1636  LATICACIDVEN 0.69    Recent Results (from the past 240 hour(s))  Blood Culture (routine x 2)     Status: None (Preliminary result)   Collection Time: 10/22/17  4:22 PM    Result Value Ref Range Status   Specimen Description BLOOD PORTA CATH  Final   Special Requests   Final    BOTTLES DRAWN AEROBIC AND ANAEROBIC Blood Culture adequate volume   Culture   Final    NO GROWTH < 12 HOURS Performed at [facility], [place] [address] [place], [place] [address]    Report Status PENDING  Incomplete  Rapid Strep Screen (Not at [facility])     Status: None   Collection Time: 10/22/17  4:54 PM  Result Value Ref Range Status   Streptococcus, Group A Screen (Direct) NEGATIVE NEGATIVE Final    Comment: (NOTE) A Rapid Antigen test may result negative if the antigen level in the sample is below the detection level of this test. The FDA has not cleared this test  as a stand-alone test therefore the rapid antigen negative result has reflexed to a Group A Strep culture.   Blood Culture (routine x 2)     Status: None (Preliminary result)   Collection Time: 10/22/17  8:14 PM  Result Value Ref Range Status   Specimen Description BLOOD LEFT ARM  Final   Special Requests   Final    BOTTLES DRAWN AEROBIC AND ANAEROBIC Blood Culture adequate volume   Culture   Final    NO GROWTH < 12 HOURS Performed at [facility], [address] [place], [place] [address]    Report Status PENDING  Incomplete  Respiratory Panel by PCR     Status: None   Collection Time: 10/23/17  7:40 AM  Result Value Ref Range Status   Adenovirus NOT DETECTED NOT DETECTED Final   Coronavirus 229E NOT DETECTED NOT DETECTED Final   Coronavirus HKU1 NOT DETECTED NOT DETECTED Final   Coronavirus NL63 NOT DETECTED NOT DETECTED Final   Coronavirus OC43 NOT DETECTED NOT DETECTED Final   Metapneumovirus NOT DETECTED NOT DETECTED Final   Rhinovirus / Enterovirus NOT DETECTED NOT DETECTED Final   Influenza A NOT DETECTED NOT DETECTED Final   Influenza B NOT DETECTED NOT DETECTED Final   Parainfluenza Virus 1 NOT DETECTED NOT DETECTED Final   Parainfluenza Virus 2 NOT DETECTED NOT DETECTED Final    Parainfluenza Virus 3 NOT DETECTED NOT DETECTED Final   Parainfluenza Virus 4 NOT DETECTED NOT DETECTED Final   Respiratory Syncytial Virus NOT DETECTED NOT DETECTED Final   Bordetella pertussis NOT DETECTED NOT DETECTED Final   Chlamydophila pneumoniae NOT DETECTED NOT DETECTED Final   Mycoplasma pneumoniae NOT DETECTED NOT DETECTED Final    Comment: Performed at [facility], [place] [address], [place], [place] [address]      Radiology Studies: Dg Chest 2 View  Result Date: 10/22/2017 CLINICAL DATA:  Head and neck cancer. EXAM: CHEST  2 VIEW COMPARISON:  10/06/2017 FINDINGS: Lungs are hyperexpanded. The lungs are clear without focal pneumonia, edema, pneumothorax or pleural effusion. The cardiopericardial silhouette is within normal limits for size. Right Port-A-Cath tip overlies the distal SVC. The visualized bony structures of the thorax are intact. Telemetry leads overlie the chest. IMPRESSION: Hyperexpansion without acute cardiopulmonary findings. Electronically Signed   By: Misty Stanley M.D.   On: 10/22/2017 18:27      Scheduled Meds: . chlorhexidine  15 mL Mouth Rinse BID  . enoxaparin (LOVENOX) injection  40 mg Subcutaneous Q24H  . feeding supplement (OSMOLITE 1.5 CAL)  237 mL Per Tube 5 X Daily  . feeding supplement (PRO-STAT SUGAR FREE 64)  30 mL Oral BID  . ferrous sulfate  300 mg Oral Daily  . fluconazole  400 mg Oral Daily  . lidocaine  10 mL Mouth/Throat TID AC & HS  . magic mouthwash  5 mL Oral QID  . mouth rinse  15 mL Mouth Rinse q12n4p  . SONAFINE  1 application Topical BID   Continuous Infusions: . sodium chloride 75 mL/hr at 10/23/17 1048  . ceFEPime (MAXIPIME) IV 2 g (10/24/17 0657)  . sodium chloride    . vancomycin Stopped (10/23/17 2141)     LOS: 1 day    Time spent: Total of 35 minutes spent with pt, greater than 50% of which was spent in discussion of  treatment, counseling and coordination of care    Kayleen Memos, MD Pager: Text Page via  [email or web address]   If 7PM-7AM,  please contact night-coverage [email or web address] 10/24/2017, 7:42 AM

## 2017-10-25 ENCOUNTER — Ambulatory Visit: Payer: Medicaid Other

## 2017-10-25 ENCOUNTER — Ambulatory Visit
Admission: RE | Admit: 2017-10-25 | Discharge: 2017-10-25 | Disposition: A | Discharge status: Home / Self Care | Payer: Medicaid Other | Source: Ambulatory Visit | Attending: Radiation Oncology | Admitting: Radiation Oncology

## 2017-10-25 ENCOUNTER — Encounter: Payer: Self-pay | Admitting: *Deleted

## 2017-10-25 ENCOUNTER — Encounter: Payer: Self-pay | Admitting: Radiation Oncology

## 2017-10-25 DIAGNOSIS — C099 Malignant neoplasm of tonsil, unspecified: Secondary | ICD-10-CM

## 2017-10-25 DIAGNOSIS — D709 Neutropenia, unspecified: Secondary | ICD-10-CM

## 2017-10-25 DIAGNOSIS — B370 Candidal stomatitis: Secondary | ICD-10-CM

## 2017-10-25 DIAGNOSIS — E871 Hypo-osmolality and hyponatremia: Secondary | ICD-10-CM

## 2017-10-25 DIAGNOSIS — R5081 Fever presenting with conditions classified elsewhere: Secondary | ICD-10-CM

## 2017-10-25 DIAGNOSIS — R509 Fever, unspecified: Secondary | ICD-10-CM

## 2017-10-25 DIAGNOSIS — E43 Unspecified severe protein-calorie malnutrition: Secondary | ICD-10-CM

## 2017-10-25 LAB — CBC
HCT: 28.6 % — ABNORMAL LOW (ref 39.0–52.0)
Hemoglobin: 9.5 g/dL — ABNORMAL LOW (ref 13.0–17.0)
MCH: 28.4 pg (ref 26.0–34.0)
MCHC: 33.2 g/dL (ref 30.0–36.0)
MCV: 85.4 fL (ref 78.0–100.0)
PLATELETS: 182 10*3/uL (ref 150–400)
RBC: 3.35 MIL/uL — ABNORMAL LOW (ref 4.22–5.81)
RDW: 13.5 % (ref 11.5–15.5)
WBC: 2.8 10*3/uL — ABNORMAL LOW (ref 4.0–10.5)

## 2017-10-25 LAB — BASIC METABOLIC PANEL
ANION GAP: 7 (ref 5–15)
BUN: 8 mg/dL (ref 6–20)
CALCIUM: 9.3 mg/dL (ref 8.9–10.3)
CO2: 30 mmol/L (ref 22–32)
Chloride: 97 mmol/L — ABNORMAL LOW (ref 101–111)
Creatinine, Ser: 0.41 mg/dL — ABNORMAL LOW (ref 0.61–1.24)
GLUCOSE: 141 mg/dL — AB (ref 65–99)
Potassium: 4.1 mmol/L (ref 3.5–5.1)
Sodium: 134 mmol/L — ABNORMAL LOW (ref 135–145)

## 2017-10-25 MED ORDER — PHENOL 1.4 % MT LIQD: 1.0000 | OROMUCOSAL | 0 refills | Status: DC | PRN

## 2017-10-25 MED ORDER — AMOXICILLIN-POT CLAVULANATE 875-125 MG PO TABS
1.0000 | ORAL_TABLET | Freq: Two times a day (BID) | ORAL | Status: DC
  Administered 2017-10-25: 1 via ORAL
  Filled 2017-10-25: qty 1

## 2017-10-25 MED ORDER — FLUCONAZOLE 40 MG/ML PO SUSR: 400.0000 mg | Freq: Every day | ORAL | 0 refills | Status: AC

## 2017-10-25 MED ORDER — FLUCONAZOLE 40 MG/ML PO SUSR: 400.0000 mg | Freq: Every day | ORAL | 0 refills | Status: DC

## 2017-10-25 MED ORDER — AMOXICILLIN-POT CLAVULANATE 875-125 MG PO TABS: 1.0000 | ORAL_TABLET | Freq: Two times a day (BID) | ORAL | 0 refills | Status: AC

## 2017-10-25 MED ORDER — ENSURE ENLIVE PO LIQD
237.0000 mL | ORAL | Status: DC
  Administered 2017-10-25: 237 mL via ORAL

## 2017-10-25 NOTE — Progress Notes (Signed)
Patient discharged to home, all discharge medications and instructions reviewed and questions answered.

## 2017-10-25 NOTE — Progress Notes (Signed)
Initial Nutrition Assessment  DOCUMENTATION CODES:   Non-severe (moderate) malnutrition in context of chronic illness, Underweight  INTERVENTION:   Continue home regimen:  -Osmolite 1.5, 237 ml 5 times daily w/ 60 ml H2O before and after each bolus. -Continue 30 ml Prostat BID -Recommend free water of 240 ml TID (720 ml total). -This provides 1975 kcal and 104g protein.  Will order Ensure Enlive po daily as tolerated, each supplement provides 350 kcal and 20 grams of protein  RD will continue to monitor  NUTRITION DIAGNOSIS:   Moderate Malnutrition related to cancer and cancer related treatments, chronic illness as evidenced by percent weight loss, moderate fat depletion, moderate muscle depletion.  GOAL:   Patient will meet greater than or equal to 90% of their needs  MONITOR:   PO intake, Supplement acceptance, Labs, Weight trends, TF tolerance, I & O's  REASON FOR ASSESSMENT:   Consult Assessment of nutrition requirement/status  ASSESSMENT:   57 year old male with history of left tonsil squamous cell carcinoma, receiving radiotherapy last treatment on 10/22/2017, completed chemotherapy on 10/14/2017.  Patient was sent to the hospital from the cancer center after having a temperature of 100.8.  Upon ED evaluation WBC 2.2, hemoglobin 9.9 CXR with hyperexpansion nut no acute findings. Patient was admitted for neutropenic fever workup.  Patient was started empiric antibiotics.  Pt with visitors at bedside. Pt reports he is not able to drink much PO d/t pain with swallowing. Pt reports he tries to drink liquids. States he drinks Ensure supplements at home but it varies how many bottles he has daily. Will order for patient.  Pt continues to aim for 5 cartons of Osmolite 1.5 daily at home. States some days he is not administering this much lately. Pt states he is tolerating his TF with no issues.  Pt followed by [place] RD, last seen 1/3.  Pt with no questions at this time  for this RD.  Per chart review, pt has lost 13 lb since 11/14 (10% wt loss x 2 months, significant for time frame).   Medications: Augmentin tablet every 12 hours, Ferrous sulfate syrup daily, Magic Mouthwash QID Labs reviewed: Low Na   NUTRITION - FOCUSED PHYSICAL EXAM:    Most Recent Value  Orbital Region  Mild depletion  Upper Arm Region  Moderate depletion  Thoracic and Lumbar Region  Mild depletion  Buccal Region  Mild depletion  Temple Region  Moderate depletion  Clavicle Bone Region  Moderate depletion  Clavicle and Acromion Bone Region  Moderate depletion  Scapular Bone Region  Unable to assess  Dorsal Hand  Moderate depletion  Patellar Region  Unable to assess  Anterior Thigh Region  Unable to assess  Posterior Calf Region  Unable to assess  Edema (RD Assessment)  None       Diet Order:  DIET SOFT Room service appropriate? Yes; Fluid consistency: Thin  EDUCATION NEEDS:   Education needs have been addressed  Skin:  Skin Assessment: Reviewed RN Assessment  Last BM:  1/2  Height:   Ht Readings from Last 1 Encounters:  10/22/17 5\' 7"  (1.702 m)    Weight:   Wt Readings from Last 1 Encounters:  10/24/17 122 lb 12.7 oz (55.7 kg)    Ideal Body Weight:  67.2 kg  BMI:  Body mass index is 19.23 kg/m.  Estimated Nutritional Needs:   Kcal:  4920-1007  Protein:  80-90g  Fluid:  2L/day  Clayton Bibles, MS, RD, LDN [facility] Clinical Dietitian Pager:  [phone] After Hours Pager: [phone]

## 2017-10-25 NOTE — Progress Notes (Signed)
Pharmacy Antibiotic Note  [name] is a 57 y.o. male admitted on 10/22/2017 with febrile neutropenia.  Pharmacy has been consulted for Vancomycin, cefepime dosing.  Plan: Day 4 Abxs - Cefepime 2g IV q8hr remains appropriate dosing - cultures continue to be negative - recommend discontinue cefepime  Height: 5\' 7"  (170.2 cm) Weight: 122 lb 12.7 oz (55.7 kg) IBW/kg (Calculated) : 66.1  Temp (24hrs), Avg:99 F (37.2 C), Min:99 F (37.2 C), Max:99.1 F (37.3 C)  Recent Labs  Lab 10/22/17 1422 10/22/17 1621 10/22/17 1636 10/23/17 0429 10/24/17 0700  WBC 2.0* 2.2*  --  2.1* 2.9*  CREATININE  --  0.49*  --  0.41* 0.41*  LATICACIDVEN  --   --  0.69  --   --     Estimated Creatinine Clearance: 82.2 mL/min (A) (by C-G formula based on SCr of 0.41 mg/dL (L)).    No Known Allergies  Antimicrobials this admission: Vancomycin 10/25/2017 >> Cefepime 10/25/2017 >>  Dose adjustments this admission: -  Microbiology results: 1/4 strep A rapid neg 1/4 Flu A/B neg 1/5 resp panel neg 1/4 Ucx NGF 1/4 Bcx2 ngtd   Thank you for allowing pharmacy to be a part of this patient's care.   Adrian Saran, PharmD, BCPS Pager [phone] 10/25/2017 8:38 AM

## 2017-10-25 NOTE — Progress Notes (Signed)
Oncology Nurse Navigator Documentation  Met with Mr. Belloso after final RT to offer support and to celebrate end of radiation treatment and during weekly PUT with Dr. Isidore Moos..   I provided verbal/written post-RT guidance:  Importance of keeping all follow-up appts, especially those with Nutrition and SLP.  Importance of protecting treatment area from sun.  Continuation of Sonafine application 2-3 times daily until supply exhausted after which transition to OTC lotion with vitamin E. I answered his questions re recovery from SEs, explained timeframe of re-staging PET. I explained that my role as navigator will continue for several more months and that I will be calling and/or joining him during follow-up visits.   I encouraged him to call me with needs/concerns.   He voiced understanding of information provided.  Gayleen Orem, RN, BSN, [place] [facility] [place] [phone]

## 2017-10-25 NOTE — Discharge Summary (Signed)
Physician Discharge Summary  [name] WJX:[id number] DOB: 04-01-1961 DOA: 10/22/2017  PCP: Beaulah Corin, [facility]  Admit date: 10/22/2017 Discharge date: 10/25/2017  Time spent: 35 minutes  Recommendations for Outpatient Follow-up:  1. Repeat CBC with differential and also BMET to follow renal function and electrolytes trend     Discharge Diagnoses:  Principal Problem:   Fever Active Problems:   Squamous cell carcinoma of left tonsil (HCC)   Hyponatremia   Anemia   Severe protein-calorie malnutrition (HCC)   Leukopenia   Thrush   Discharge Condition: stable and improved. Patient discharge home with instructions to follow up with PCP and with his oncologist as an outpatient.   Diet recommendation: continue PEG tube and follow soft diet as tolerated. Needs further evaluation with speech therapy and GI as an outpatient.  Filed Weights   10/22/17 2006 10/23/17 0425 10/24/17 0428  Weight: 53.1 kg (116 lb 15.8 oz) 53.1 kg (116 lb 15.8 oz) 55.7 kg (122 lb 12.7 oz)    History of present illness:  57 year old male with history of left tonsil squamous cell carcinoma, receiving radiotherapy last treatment on 10/22/2017, completed chemotherapy on 10/14/2017.  Patient was sent to the hospital from the cancer center after having a temperature of 100.8.  Upon ED evaluation WBC 2.2, hemoglobin 9.9 CXR with hyperexpansion nut no acute findings. Patient was admitted for neutropenic fever workup.  Patient was started empiric antibiotics.  Hospital Course:  Neutropenic fever - ANC 1.1 -No clear source of infection, although patient has oral candidiasis -patient cover with broad antibiotics while neutropenic; now will narrow to augmentin and complete 7 more days of antibiotics treatment.  -patient neutropenia resolved prior to discharge  -Blood cultures without growth  -RVP neg -Rapid strep negative   Oral candidiasis with odynophagia  -Differential include - radiation esophagitis (this will  take time to heal) and may need GI consult as an outpatient. Vs fungal esophagitis. -Continue phenol spray and treat with oral diflucan   Left tonsil squamous cell carcinoma -continue outpatient follow up with Dr. Lebron Conners as an outpatient   Anemia of chronic diseases  -Hgb stable  -No overt bleeding  -Continue to monitor trend -no signs of active bleeding   Protein calorie malnutrition severe -Nutrition service consulted, appreciated recommendations -patient will continue tube feedings and slowly incorporating PO intake as well.  -advise to maintain adequate hydration    Procedures:  See below for x-ray reports   Consultations:  None   Discharge Exam: Vitals:   10/24/17 1945 10/25/17 0410  BP: 108/69 104/74  Pulse: 66 69  Resp: 16 14  Temp: 99.1 F (37.3 C) 99 F (37.2 C)  SpO2: 100% 99%   General exam: Thin frail, afebrile and in NAD  HEENT: Oral thrush with throat involvement appreciated. Mucosal membranes moist  Respiratory system: Clear to auscultation. No wheezes,crackle or rhonchi Cardiovascular system: S1 & S2 heard, RRR. No JVD, murmurs, rubs or gallops Gastrointestinal system: Abdomen is nondistended, soft and nontender. No organomegaly or masses felt. Normal bowel sounds heard. Central nervous system: Alert and oriented. No focal neurological or motor deficits. Extremities: No pedal edema.  Psychiatry:  Mood & affect appropriate.    Discharge Instructions   Discharge Instructions    Discharge instructions   Complete by:  As directed    Take medications as prescribed  Arrange follow up with PCP and with oncologist as an outpatient Keep yourself well hydrated Maintain adequate hydration     Allergies as of 10/25/2017  No Known Allergies     Medication List    STOP taking these medications   fluconazole 200 MG tablet Commonly known as:  DIFLUCAN Replaced by:  fluconazole 40 MG/ML suspension     TAKE these medications   acetaminophen 325  MG tablet Commonly known as:  TYLENOL Take 650 mg by mouth every 6 (six) hours as needed for moderate pain.   amoxicillin-clavulanate 875-125 MG tablet Commonly known as:  AUGMENTIN Take 1 tablet by mouth every 12 (twelve) hours for 7 days.   dexamethasone 4 MG tablet Commonly known as:  DECADRON Take 2 tablets (8 mg total) daily by mouth. Start the day after chemotherapy for 2 days.   feeding supplement (OSMOLITE 1.5 CAL) Liqd Give 1 bottle Osmolite 1.5 via PEG 5 times daily with 60 mL free water before and after bolus. Drink or flush tube with additional 720 mL free water 3 times daily.   ferrous sulfate 325 (65 FE) MG tablet Take 1 tablet (325 mg total) by mouth daily.   fluconazole 40 MG/ML suspension Commonly known as:  DIFLUCAN Take 10 mLs (400 mg total) by mouth daily for 9 days. Start taking on:  10/26/2017 Replaces:  fluconazole 200 MG tablet   ibuprofen 200 MG tablet Commonly known as:  ADVIL,MOTRIN Take 200 mg by mouth daily as needed (PAIN).   lidocaine 2 % solution Commonly known as:  XYLOCAINE Mix 1 part 2%viscous lidocaine,1part H2O.Swish and/or swallow 37mL of this mixture,57min before meals and at bedtime, up to QID   lidocaine-prilocaine cream Commonly known as:  EMLA Apply to affected area once   LORazepam 0.5 MG tablet Commonly known as:  ATIVAN Take 1 tablet (0.5 mg total) every 6 (six) hours as needed by mouth (Nausea or vomiting).   morphine CONCENTRATE 10 mg / 0.5 ml concentrated solution Place 0.5 mLs (10 mg total) into feeding tube every 4 (four) hours as needed for severe pain.   oxyCODONE 5 MG immediate release tablet Commonly known as:  Oxy IR/ROXICODONE 1 to 2 Q 4 hours prn pain   phenol 1.4 % Liqd Commonly known as:  CHLORASEPTIC Use as directed 1 spray in the mouth or throat as needed for throat irritation / pain.   SONAFINE Apply 1 application topically 2 (two) times daily.      No Known Allergies Follow-up Information    [facility],  [facility]. Schedule an appointment as soon as possible for a visit in 2 week(s).   Specialty:  Internal Medicine Contact information: [address] [place] [address] [phone]        Ardath Sax, MD. Schedule an appointment as soon as possible for a visit in 10 day(s).   Specialty:  Hematology and Oncology Contact information: [place] [address] [phone]           The results of significant diagnostics from this hospitalization (including imaging, microbiology, ancillary and laboratory) are listed below for reference.    Significant Diagnostic Studies: Dg Chest 2 View  Result Date: 10/22/2017 CLINICAL DATA:  Head and neck cancer. EXAM: CHEST  2 VIEW COMPARISON:  10/06/2017 FINDINGS: Lungs are hyperexpanded. The lungs are clear without focal pneumonia, edema, pneumothorax or pleural effusion. The cardiopericardial silhouette is within normal limits for size. Right Port-A-Cath tip overlies the distal SVC. The visualized bony structures of the thorax are intact. Telemetry leads overlie the chest. IMPRESSION: Hyperexpansion without acute cardiopulmonary findings. Electronically Signed   By: Verda Cumins.D.  On: 10/22/2017 18:27   Dg Chest 2 View  Result Date: 10/06/2017 CLINICAL DATA:  Cough, weakness.  Fever. EXAM: CHEST  2 VIEW COMPARISON:  03/12/2015 FINDINGS: Right Port-A-Cath is in place with the tip at the cavoatrial junction. Mild hyperinflation. Heart and mediastinal contours are within normal limits. No focal opacities or effusions. No acute bony abnormality. IMPRESSION: Mild hyperinflation.  No active cardiopulmonary disease. Electronically Signed   By: Rolm Baptise M.D.   On: 10/06/2017 20:27    Microbiology: Recent Results (from the past 240 hour(s))  Blood Culture (routine x 2)     Status: None (Preliminary result)   Collection Time: 10/22/17  4:22 PM  Result Value Ref Range Status   Specimen Description BLOOD PORTA CATH   Final   Special Requests   Final    BOTTLES DRAWN AEROBIC AND ANAEROBIC Blood Culture adequate volume   Culture   Final    NO GROWTH 3 DAYS Performed at [facility], [address] [place], [place] [address]    Report Status PENDING  Incomplete  Rapid Strep Screen (Not at [facility])     Status: None   Collection Time: 10/22/17  4:54 PM  Result Value Ref Range Status   Streptococcus, Group A Screen (Direct) NEGATIVE NEGATIVE Final    Comment: (NOTE) A Rapid Antigen test may result negative if the antigen level in the sample is below the detection level of this test. The FDA has not cleared this test as a stand-alone test therefore the rapid antigen negative result has reflexed to a Group A Strep culture.   Blood Culture (routine x 2)     Status: None (Preliminary result)   Collection Time: 10/22/17  8:14 PM  Result Value Ref Range Status   Specimen Description BLOOD LEFT ARM  Final   Special Requests   Final    BOTTLES DRAWN AEROBIC AND ANAEROBIC Blood Culture adequate volume   Culture   Final    NO GROWTH 3 DAYS Performed at [facility], [address] [place], [place] [address]    Report Status PENDING  Incomplete  Urine culture     Status: None   Collection Time: 10/22/17  9:15 PM  Result Value Ref Range Status   Specimen Description URINE, RANDOM  Final   Special Requests NONE  Final   Culture   Final    NO GROWTH Performed at [facility], [address], [place], [place] [address]    Report Status 10/24/2017 FINAL  Final  Respiratory Panel by PCR     Status: None   Collection Time: 10/23/17  7:40 AM  Result Value Ref Range Status   Adenovirus NOT DETECTED NOT DETECTED Final   Coronavirus 229E NOT DETECTED NOT DETECTED Final   Coronavirus HKU1 NOT DETECTED NOT DETECTED Final   Coronavirus NL63 NOT DETECTED NOT DETECTED Final   Coronavirus OC43 NOT DETECTED NOT DETECTED Final   Metapneumovirus NOT DETECTED NOT DETECTED Final   Rhinovirus /  Enterovirus NOT DETECTED NOT DETECTED Final   Influenza A NOT DETECTED NOT DETECTED Final   Influenza B NOT DETECTED NOT DETECTED Final   Parainfluenza Virus 1 NOT DETECTED NOT DETECTED Final   Parainfluenza Virus 2 NOT DETECTED NOT DETECTED Final   Parainfluenza Virus 3 NOT DETECTED NOT DETECTED Final   Parainfluenza Virus 4 NOT DETECTED NOT DETECTED Final   Respiratory Syncytial Virus NOT DETECTED NOT DETECTED Final   Bordetella pertussis NOT DETECTED NOT DETECTED Final  Chlamydophila pneumoniae NOT DETECTED NOT DETECTED Final   Mycoplasma pneumoniae NOT DETECTED NOT DETECTED Final    Comment: Performed at [facility], [place] [address], [place],  [address]     Labs: Basic Metabolic Panel: Recent Labs  Lab 10/22/17 1621 10/23/17 0429 10/24/17 0700 10/25/17 1002  NA 133* 134* 133* 134*  K 3.9 3.7 4.0 4.1  CL 95* 100* 96* 97*  CO2 29 29 31 30   GLUCOSE 112* 102* 116* 141*  BUN 10 9 9 8   CREATININE 0.49* 0.41* 0.41* 0.41*  CALCIUM 9.4 8.6* 8.9 9.3   Liver Function Tests: Recent Labs  Lab 10/22/17 1621 10/23/17 0429  AST 19 17  ALT 17 14*  ALKPHOS 65 54  BILITOT 0.7 0.5  PROT 6.8 6.1*  ALBUMIN 3.2* 2.9*   CBC: Recent Labs  Lab 10/22/17 1422 10/22/17 1621 10/23/17 0429 10/24/17 0700 10/25/17 1002  WBC 2.0* 2.2* 2.1* 2.9* 2.8*  NEUTROABS 1.1* 1.2* 1.1* 2.0  --   HGB 10.0* 9.9* 9.0* 9.0* 9.5*  HCT 30.1* 29.5* 26.8* 26.8* 28.6*  MCV 87.0 85.8 86.5 85.1 85.4  PLT 216 213 181 185 182    Signed:  Barton Dubois MD.  [facility] 10/25/2017, 12:17 PM

## 2017-10-27 LAB — CULTURE, BLOOD (ROUTINE X 2)
CULTURE: NO GROWTH
CULTURE: NO GROWTH
Special Requests: ADEQUATE
Special Requests: ADEQUATE

## 2017-10-27 NOTE — Progress Notes (Signed)
  Radiation Oncology         [phone] ________________________________  Name: [name] MRN: [id number]  Date: 10/25/2017  DOB: 09/27/1961  End of Treatment Note  Diagnosis:   57 y.o. 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  He experienced mild dysphagia, odynophagia, and radiation-related skin changes. His neck was erythematous with patchy scabbed desquamation. He was using neosporin and sonafine to these areas. By the end of treatment there was mucositis in his mouth with erythema. He was not eating or drinking anything by mouth. He was instilling water and 6 cans of Ensure via his PEG tube daily.  Plan: The patient has completed radiation treatment. The patient will return to radiation oncology clinic for routine followup in one half month. Continue f/u with nutrition , med onc and SLP.  I advised the patient to call or return sooner if any questions or concerns arise that are related to recovery or treatment.  -----------------------------------  Eppie Gibson, MD  This document serves as a record of services personally performed by Eppie Gibson, MD. It was created on her behalf by Rae Lips, a trained medical scribe. The creation of this record is based on the scribe's personal observations and the provider's statements to them. This document has been checked and approved by the attending provider.

## 2017-10-28 ENCOUNTER — Encounter: Payer: Self-pay | Admitting: Radiation Oncology

## 2017-10-28 NOTE — Progress Notes (Signed)
[name] presents for follow up of radiation completed 10/25/2017 to his Left Tonsil and bilateral neck.   Pain issues, if any: No Using a feeding tube?: Yes, he is instilling 8 cans of osmolite and/or ensure daily and free water. Weight changes, if any:  10/11/17 116.2 lb 10/18/17 115.2 lb 10/25/17 118.2 lb 11/05/17 118.8 Swallowing issues, if any: He is eating some softer foods, though he tells me that water and food burns his mouth.  Smoking or chewing tobacco? No Using fluoride trays daily? N/A Last ENT visit was on: Not since diagnosis Other notable issues, if any: He does not feel well today. Orthostatics: BP sitting 90/67 pulse 97, BP standing 83/59 pulse 112  Dr,. Perlov 11/08/17 11/08/17 [name] 11/22/17 Dr. Enrique Sack   BP 90/63 (Patient Position: Sitting)   Pulse 97   Temp 99.6 F (37.6 C)   Ht 5\' 7"  (1.702 m)   Wt 118 lb 12.8 oz (53.9 kg)   SpO2 100% Comment: room air  BMI 18.61 kg/m

## 2017-10-29 ENCOUNTER — Telehealth: Payer: Self-pay | Admitting: Hematology and Oncology

## 2017-10-29 NOTE — Telephone Encounter (Signed)
Scheduled appt per 1/11 sch msg - left voicemail for patient regarding appts.

## 2017-11-03 NOTE — Progress Notes (Signed)
[facility] Follow-up Visit:  Assessment: Fever 57 y.o. male with recent diagnosis of squamous cell carcinoma of the left tonsil treated with systemic chemotherapy with carboplatin and paclitaxel administered concurrently with radiotherapy.  Presents to the clinic today due to symptoms of fever and malaise.  Febrile in the clinic with temperature of 100.8.  Appears generally unwell, without localizing infectious signs   Plan: --Stat CBC with differential, if neutropenic, will transfer to emergency room for evaluation and potential admission.    Voice recognition software was used and creation of this note. Despite my best effort at editing the text, some misspelling/errors may have occurred.  Orders Placed This Encounter  Procedures  . Influenza A and B    Standing Status:   Future    Standing Expiration Date:   10/22/2018  . CBC with Differential    Standing Status:   Future    Number of Occurrences:   1    Standing Expiration Date:   10/22/2018    Cancer Staging Squamous cell carcinoma of left tonsil (HCC) Staging form: Pharynx - P16 Negative Oropharynx, AJCC 8th Edition - Clinical stage from 08/06/2017: Stage II (cT2, cN0, cM0, p16: Negative) - Signed by Ardath Sax, MD on 09/01/2017   All questions were answered. . The patient knows to call the clinic with any problems, questions or concerns.  This note was electronically signed.    History of Presenting Illness [name] is a 57 y.o. male followed in the [facility] for diagnosis of squamous cell carcinoma of the left tonsil. Patient initially presented with sore throat onset around the middle of June. He first presented to the Urgent Care on 04/26/17 after 3 weeks of symptoms. The sore throat was also accompanied by the globus sensation in the neck. He was treated for uvulitis initially, wihtout significant improvement despite two additional visits to the Urgent Care. On his last visit with  the [facility]gent CareENT referral was made.  The patient saw Dr. Janace Hoard from [facility] and underwent laryngoscopy on 07/16/17 with biopsy which confirmed presence of squamous cell carcinoma. Please see results of additional evaluation in the oncological history below. At the present time, additional assessment is pending. Patient was seen by dentistry and multiple dental extractions were recommended. He underwent surgical removal of his teeth on 08/09/17.    Patient has completed his systemic chemotherapy administered concurrently with radiation.  Currently presents to the clinic for recovery monitoring/symptom management.  Reports having fever at home, feeling unwell overall without sinus congestion, shortness of breath, cough, diarrhea, or dysuria.  Oncological/hematological History:   Squamous cell carcinoma of left tonsil ([place])   07/16/2017 Initial Diagnosis    Squamous cell carcinoma of left tonsil ([place])      07/16/2017 Pathology Results    Diagnosis: Tonsil, biopsy, left mass -- SQUAMOUS CELL CARCINOMA; The biopsy has at least squamous cell carcinoma in-situ. There are foci suspicious but not definitive for invasion. p16 immunohistochemistry is negative in the neoplastic cells. FINAL DIAGNOSIS       07/27/2017 Imaging    CT neck: Indistinct tonsillar enlargement on the left in the region of left tonsillar mass biopsy. This could be a combination of post biopsy edema and residual mass. Margins are not discrete and accurate measurement cannot be performed. This is estimated at about 3 cm in size. No evidence of metastatic adenopathy.      08/16/2017 Imaging    PET-CT: Hypermetabolic disease in the left palatal  fossa.  Indeterminate level 3 lymph node which may be reactive considering recent dental extractions.      09/01/2017 -  Radiation Therapy         09/01/2017 -  Chemotherapy    Carbboplatin AUC2,d1 + Paclitaxel '45mg'$ /m2, d1 Q7d --Week #1, 48/25/00: Complicated by some  burning/soreness in the throat. --Week #2, 09/08/17: --Week #3, 09/15/17: --Week #4, 09/22/17: --Week #5, 37/04/88: Complicated by admission for neutropenic fever 12/19-22/18 --Week #6, 10/15/17:      09/13/2017 Procedure    Successful fluoroscopic insertion of a 20-French pull-through gastrostomy tube. Successful placement of a right internal jugular approach power injectable Port-A-Cath.       Medical History: Past Medical History:  Diagnosis Date  . Cancer ([place])    left tonsil cancer  . History of radiation therapy 09/01/17- 10/25/17   Left tonsil and bilateral neck 70 Gy in 35 fractions to gross disease, 63 gy in 35 fractions to high risk nodal echelons, and 56 Gy in 35 fraction to intermediate risk nodal echelons.     Surgical History: Past Surgical History:  Procedure Laterality Date  . IR FLUORO GUIDE PORT INSERTION RIGHT  09/13/2017  . IR GASTROSTOMY TUBE MOD SED  09/13/2017  . IR US GUIDE VASC ACCESS RIGHT  09/13/2017  . MULTIPLE EXTRACTIONS WITH ALVEOLOPLASTY N/A 08/09/2017   Procedure: Extraction of tooth #'s 1-6, 11-17,21,22,26-30 and 32 with alveoloplasty and bilateral mandibular tori reductions;  Surgeon: Lenn Cal, DDS;  Location: [facility];  Service: Oral Surgery;  Laterality: N/A;  . tonsil biopsy     left tonsil    Family History: Family History  Problem Relation Age of Onset  . Diabetes Mother     Social History: Social History   Socioeconomic History  . Marital status: Single    Spouse name: Not on file  . Number of children: 1  . Years of education: Not on file  . Highest education level: Not on file  Social Needs  . Financial resource strain: Not on file  . Food insecurity - worry: Not on file  . Food insecurity - inability: Not on file  . Transportation needs - medical: Not on file  . Transportation needs - non-medical: Not on file  Occupational History  . Occupation: Disabled  Tobacco Use  . Smoking status: Former Smoker     Packs/day: 1.00    Years: 30.00    Pack years: 30.00    Types: Cigarettes  . Smokeless tobacco: Never Used  Substance and Sexual Activity  . Alcohol use: Yes    Alcohol/week: 4.2 oz    Types: 7 Cans of beer per week    Comment: 1 pint daily   . Drug use: No    Comment: he has a past history of cocaine abuse, he denies current use  . Sexual activity: Not on file  Other Topics Concern  . Not on file  Social History Narrative  . Not on file    Allergies: No Known Allergies  Medications:  Current Outpatient Medications  Medication Sig Dispense Refill  . acetaminophen (TYLENOL) 325 MG tablet Take 650 mg by mouth every 6 (six) hours as needed for moderate pain.     Marland Kitchen dexamethasone (DECADRON) 4 MG tablet Take 2 tablets (8 mg total) daily by mouth. Start the day after chemotherapy for 2 days. 30 tablet 1  . ferrous sulfate 325 (65 FE) MG tablet Take 1 tablet (325 mg total) by mouth daily. 30 tablet 0  .  lidocaine (XYLOCAINE) 2 % solution Mix 1 part 2%viscous lidocaine,1part H2O.Swish and/or swallow 28m of this mixture,373m before meals and at bedtime, up to QID 100 mL 5  . lidocaine-prilocaine (EMLA) cream Apply to affected area once (Patient not taking: Reported on 10/22/2017) 30 g 3  . LORazepam (ATIVAN) 0.5 MG tablet Take 1 tablet (0.5 mg total) every 6 (six) hours as needed by mouth (Nausea or vomiting). 30 tablet 0  . Morphine Sulfate (MORPHINE CONCENTRATE) 10 mg / 0.5 ml concentrated solution Place 0.5 mLs (10 mg total) into feeding tube every 4 (four) hours as needed for severe pain. 1 Bottle 0  . Nutritional Supplements (FEEDING SUPPLEMENT, OSMOLITE 1.5 CAL,) LIQD Give 1 bottle Osmolite 1.5 via PEG 5 times daily with 60 mL free water before and after bolus. Drink or flush tube with additional 720 mL free water 3 times daily. 5 Bottle 0  . oxyCODONE (OXY IR/ROXICODONE) 5 MG immediate release tablet 1 to 2 Q 4 hours prn pain 40 tablet 0  . Wound Dressings (SONAFINE) Apply 1  application topically 2 (two) times daily.    . fluconazole (DIFLUCAN) 40 MG/ML suspension Take 10 mLs (400 mg total) by mouth daily for 9 days. 100 mL 0  . ibuprofen (ADVIL,MOTRIN) 200 MG tablet Take 200 mg by mouth daily as needed (PAIN).    . Marland Kitchenhenol (CHLORASEPTIC) 1.4 % LIQD Use as directed 1 spray in the mouth or throat as needed for throat irritation / pain. 1 Bottle 0   No current facility-administered medications for this visit.     Review of Systems: Review of Systems  Constitutional: Positive for chills, diaphoresis, fatigue and fever.  HENT:   Positive for sore throat and trouble swallowing.   All other systems reviewed and are negative.    PHYSICAL EXAMINATION Blood pressure 105/68, pulse 78, temperature (!) 100.8 F (38.2 C), temperature source Oral, resp. rate 20, weight 116 lb 11.2 oz (52.9 kg), SpO2 100 %.  ECOG PERFORMANCE STATUS: 3 - Symptomatic, >50% confined to bed  Physical Exam  Constitutional: He is oriented to person, place, and time. No distress.  Patient appears unwell today.  No increased work of breathing.  HENT:  Head: Normocephalic and atraumatic.  Mouth/Throat: Uvula is midline and mucous membranes are normal. He does not have dentures. Oral lesions present. Abnormal dentition. Dental caries present. No dental abscesses or uvula swelling. No oropharyngeal exudate or tonsillar abscesses.  New white patches noted in the mouth consistent with thrush  Eyes: Conjunctivae and EOM are normal. Pupils are equal, round, and reactive to light. No scleral icterus.  Neck: No thyromegaly present.  Cardiovascular: Normal rate, regular rhythm, normal heart sounds and intact distal pulses.  No murmur heard. Pulmonary/Chest: Effort normal. He has no wheezes. He has rales.  Abdominal: Soft. Bowel sounds are normal. He exhibits no distension and no mass. There is no tenderness. There is no rebound and no guarding.  Musculoskeletal: Normal range of motion. He exhibits no  edema.  Lymphadenopathy:    He has no cervical adenopathy.  Neurological: He is alert and oriented to person, place, and time. He has normal reflexes. No cranial nerve deficit. Coordination normal.  Skin: Skin is warm. No rash noted. He is diaphoretic. No erythema.     LABORATORY DATA: I have personally reviewed the data as listed: Admission on 10/22/2017, Discharged on 10/25/2017  Component Date Value Ref Range Status  . Streptococcus, Group A Screen (Dir* 10/22/2017 NEGATIVE  NEGATIVE Final   Comment: (  NOTE) A Rapid Antigen test may result negative if the antigen level in the sample is below the detection level of this test. The FDA has not cleared this test as a stand-alone test therefore the rapid antigen negative result has reflexed to a Group A Strep culture.   . Lactic Acid, Venous 10/22/2017 0.69  0.5 - 1.9 mmol/L Final  . Sodium 10/22/2017 133* 135 - 145 mmol/L Final  . Potassium 10/22/2017 3.9  3.5 - 5.1 mmol/L Final  . Chloride 10/22/2017 95* 101 - 111 mmol/L Final  . CO2 10/22/2017 29  22 - 32 mmol/L Final  . Glucose, Bld 10/22/2017 112* 65 - 99 mg/dL Final  . BUN 10/22/2017 10  6 - 20 mg/dL Final  . Creatinine, Ser 10/22/2017 0.49* 0.61 - 1.24 mg/dL Final  . Calcium 10/22/2017 9.4  8.9 - 10.3 mg/dL Final  . Total Protein 10/22/2017 6.8  6.5 - 8.1 g/dL Final  . Albumin 10/22/2017 3.2* 3.5 - 5.0 g/dL Final  . AST 10/22/2017 19  15 - 41 U/L Final  . ALT 10/22/2017 17  17 - 63 U/L Final  . Alkaline Phosphatase 10/22/2017 65  38 - 126 U/L Final  . Total Bilirubin 10/22/2017 0.7  0.3 - 1.2 mg/dL Final  . GFR calc non Af Amer 10/22/2017 >60  >60 mL/min Final  . GFR calc Af Amer 10/22/2017 >60  >60 mL/min Final   Comment: (NOTE) The eGFR has been calculated using the CKD EPI equation. This calculation has not been validated in all clinical situations. eGFR's persistently <60 mL/min signify possible Chronic Kidney Disease.   . Anion gap 10/22/2017 9  5 - 15 Final  . WBC  10/22/2017 2.2* 4.0 - 10.5 K/uL Final  . RBC 10/22/2017 3.44* 4.22 - 5.81 MIL/uL Final  . Hemoglobin 10/22/2017 9.9* 13.0 - 17.0 g/dL Final  . HCT 10/22/2017 29.5* 39.0 - 52.0 % Final  . MCV 10/22/2017 85.8  78.0 - 100.0 fL Final  . MCH 10/22/2017 28.8  26.0 - 34.0 pg Final  . MCHC 10/22/2017 33.6  30.0 - 36.0 g/dL Final  . RDW 10/22/2017 13.6  11.5 - 15.5 % Final  . Platelets 10/22/2017 213  150 - 400 K/uL Final  . Neutrophils Relative % 10/22/2017 53  % Final  . Neutro Abs 10/22/2017 1.2* 1.7 - 7.7 K/uL Final  . Lymphocytes Relative 10/22/2017 29  % Final  . Lymphs Abs 10/22/2017 0.7  0.7 - 4.0 K/uL Final  . Monocytes Relative 10/22/2017 17  % Final  . Monocytes Absolute 10/22/2017 0.4  0.1 - 1.0 K/uL Final  . Eosinophils Relative 10/22/2017 1  % Final  . Eosinophils Absolute 10/22/2017 0.0  0.0 - 0.7 K/uL Final  . Basophils Relative 10/22/2017 0  % Final  . Basophils Absolute 10/22/2017 0.0  0.0 - 0.1 K/uL Final  . Specimen Description 10/22/2017 BLOOD PORTA CATH   Final  . Special Requests 10/22/2017 BOTTLES DRAWN AEROBIC AND ANAEROBIC Blood Culture adequate volume   Final  . Culture 10/22/2017    Final                   Value:NO GROWTH 5 DAYS Performed at [facility], [place] [address] [place], [place] [address]   . Report Status 10/22/2017 10/27/2017 FINAL   Final  . Specimen Description 10/22/2017 BLOOD LEFT ARM   Final  . Special Requests 10/22/2017 BOTTLES DRAWN AEROBIC AND ANAEROBIC Blood Culture adequate volume   Final  . Culture  10/22/2017    Final                   Value:NO GROWTH 5 DAYS Performed at [facility], [place] [address] [place], [place] [address]   . Report Status 10/22/2017 10/27/2017 FINAL   Final  . Color, Urine 10/22/2017 YELLOW  YELLOW Final  . APPearance 10/22/2017 CLEAR  CLEAR Final  . Specific Gravity, Urine 10/22/2017 1.017  1.005 - 1.030 Final  . pH 10/22/2017 6.0  5.0 - 8.0 Final  . Glucose, UA 10/22/2017 NEGATIVE  NEGATIVE mg/dL Final   . Hgb urine dipstick 10/22/2017 NEGATIVE  NEGATIVE Final  . Bilirubin Urine 10/22/2017 NEGATIVE  NEGATIVE Final  . Ketones, ur 10/22/2017 NEGATIVE  NEGATIVE mg/dL Final  . Protein, ur 10/22/2017 NEGATIVE  NEGATIVE mg/dL Final  . Nitrite 10/22/2017 NEGATIVE  NEGATIVE Final  . Leukocytes, UA 10/22/2017 NEGATIVE  NEGATIVE Final  . Specimen Description 10/22/2017 URINE, RANDOM   Final  . Special Requests 10/22/2017 NONE   Final  . Culture 10/22/2017    Final                   Value:NO GROWTH Performed at [facility], [place] [address], [place], [place] [address]   . Report Status 10/22/2017 10/24/2017 FINAL   Final  . Influenza A By PCR 10/22/2017 NEGATIVE  NEGATIVE Final  . Influenza B By PCR 10/22/2017 NEGATIVE  NEGATIVE Final   Comment: (NOTE) The Xpert Xpress Flu assay is intended as an aid in the diagnosis of  influenza and should not be used as a sole basis for treatment.  This  assay is FDA approved for nasopharyngeal swab specimens only. Nasal  washings and aspirates are unacceptable for Xpert Xpress Flu testing.   . Sodium 10/23/2017 134* 135 - 145 mmol/L Final  . Potassium 10/23/2017 3.7  3.5 - 5.1 mmol/L Final  . Chloride 10/23/2017 100* 101 - 111 mmol/L Final  . CO2 10/23/2017 29  22 - 32 mmol/L Final  . Glucose, Bld 10/23/2017 102* 65 - 99 mg/dL Final  . BUN 10/23/2017 9  6 - 20 mg/dL Final  . Creatinine, Ser 10/23/2017 0.41* 0.61 - 1.24 mg/dL Final  . Calcium 10/23/2017 8.6* 8.9 - 10.3 mg/dL Final  . Total Protein 10/23/2017 6.1* 6.5 - 8.1 g/dL Final  . Albumin 10/23/2017 2.9* 3.5 - 5.0 g/dL Final  . AST 10/23/2017 17  15 - 41 U/L Final  . ALT 10/23/2017 14* 17 - 63 U/L Final  . Alkaline Phosphatase 10/23/2017 54  38 - 126 U/L Final  . Total Bilirubin 10/23/2017 0.5  0.3 - 1.2 mg/dL Final  . GFR calc non Af Amer 10/23/2017 >60  >60 mL/min Final  . GFR calc Af Amer 10/23/2017 >60  >60 mL/min Final   Comment: (NOTE) The eGFR has been calculated using the CKD EPI  equation. This calculation has not been validated in all clinical situations. eGFR's persistently <60 mL/min signify possible Chronic Kidney Disease.   . Anion gap 10/23/2017 5  5 - 15 Final  . WBC 10/23/2017 2.1* 4.0 - 10.5 K/uL Final  . RBC 10/23/2017 3.10* 4.22 - 5.81 MIL/uL Final  . Hemoglobin 10/23/2017 9.0* 13.0 - 17.0 g/dL Final  . HCT 10/23/2017 26.8* 39.0 - 52.0 % Final  . MCV 10/23/2017 86.5  78.0 - 100.0 fL Final  . MCH 10/23/2017 29.0  26.0 - 34.0 pg Final  . MCHC 10/23/2017 33.6  30.0 - 36.0 g/dL  Final  . RDW 10/23/2017 13.7  11.5 - 15.5 % Final  . Platelets 10/23/2017 181  150 - 400 K/uL Final  . Neutrophils Relative % 10/23/2017 54  % Final  . Neutro Abs 10/23/2017 1.1* 1.7 - 7.7 K/uL Final  . Lymphocytes Relative 10/23/2017 24  % Final  . Lymphs Abs 10/23/2017 0.5* 0.7 - 4.0 K/uL Final  . Monocytes Relative 10/23/2017 20  % Final  . Monocytes Absolute 10/23/2017 0.4  0.1 - 1.0 K/uL Final  . Eosinophils Relative 10/23/2017 1  % Final  . Eosinophils Absolute 10/23/2017 0.0  0.0 - 0.7 K/uL Final  . Basophils Relative 10/23/2017 1  % Final  . Basophils Absolute 10/23/2017 0.0  0.0 - 0.1 K/uL Final  . Adenovirus 10/23/2017 NOT DETECTED  NOT DETECTED Final  . Coronavirus 229E 10/23/2017 NOT DETECTED  NOT DETECTED Final  . Coronavirus HKU1 10/23/2017 NOT DETECTED  NOT DETECTED Final  . Coronavirus NL63 10/23/2017 NOT DETECTED  NOT DETECTED Final  . Coronavirus OC43 10/23/2017 NOT DETECTED  NOT DETECTED Final  . Metapneumovirus 10/23/2017 NOT DETECTED  NOT DETECTED Final  . Rhinovirus / Enterovirus 10/23/2017 NOT DETECTED  NOT DETECTED Final  . Influenza A 10/23/2017 NOT DETECTED  NOT DETECTED Final  . Influenza B 10/23/2017 NOT DETECTED  NOT DETECTED Final  . Parainfluenza Virus 1 10/23/2017 NOT DETECTED  NOT DETECTED Final  . Parainfluenza Virus 2 10/23/2017 NOT DETECTED  NOT DETECTED Final  . Parainfluenza Virus 3 10/23/2017 NOT DETECTED  NOT DETECTED Final  .  Parainfluenza Virus 4 10/23/2017 NOT DETECTED  NOT DETECTED Final  . Respiratory Syncytial Virus 10/23/2017 NOT DETECTED  NOT DETECTED Final  . Bordetella pertussis 10/23/2017 NOT DETECTED  NOT DETECTED Final  . Chlamydophila pneumoniae 10/23/2017 NOT DETECTED  NOT DETECTED Final  . Mycoplasma pneumoniae 10/23/2017 NOT DETECTED  NOT DETECTED Final   Performed at [facility], [place] [address], [place], [place] [address]  . Sodium 10/24/2017 133* 135 - 145 mmol/L Final  . Potassium 10/24/2017 4.0  3.5 - 5.1 mmol/L Final  . Chloride 10/24/2017 96* 101 - 111 mmol/L Final  . CO2 10/24/2017 31  22 - 32 mmol/L Final  . Glucose, Bld 10/24/2017 116* 65 - 99 mg/dL Final  . BUN 10/24/2017 9  6 - 20 mg/dL Final  . Creatinine, Ser 10/24/2017 0.41* 0.61 - 1.24 mg/dL Final  . Calcium 10/24/2017 8.9  8.9 - 10.3 mg/dL Final  . GFR calc non Af Amer 10/24/2017 >60  >60 mL/min Final  . GFR calc Af Amer 10/24/2017 >60  >60 mL/min Final   Comment: (NOTE) The eGFR has been calculated using the CKD EPI equation. This calculation has not been validated in all clinical situations. eGFR's persistently <60 mL/min signify possible Chronic Kidney Disease.   . Anion gap 10/24/2017 6  5 - 15 Final  . WBC 10/24/2017 2.9* 4.0 - 10.5 K/uL Final  . RBC 10/24/2017 3.15* 4.22 - 5.81 MIL/uL Final  . Hemoglobin 10/24/2017 9.0* 13.0 - 17.0 g/dL Final  . HCT 10/24/2017 26.8* 39.0 - 52.0 % Final  . MCV 10/24/2017 85.1  78.0 - 100.0 fL Final  . MCH 10/24/2017 28.6  26.0 - 34.0 pg Final  . MCHC 10/24/2017 33.6  30.0 - 36.0 g/dL Final  . RDW 10/24/2017 13.7  11.5 - 15.5 % Final  . Platelets 10/24/2017 185  150 - 400 K/uL Final  . Neutrophils Relative % 10/24/2017 68  % Final  . Neutro Abs 10/24/2017  2.0  1.7 - 7.7 K/uL Final  . Lymphocytes Relative 10/24/2017 16  % Final  . Lymphs Abs 10/24/2017 0.5* 0.7 - 4.0 K/uL Final  . Monocytes Relative 10/24/2017 15  % Final  . Monocytes Absolute 10/24/2017 0.5  0.1 - 1.0 K/uL  Final  . Eosinophils Relative 10/24/2017 1  % Final  . Eosinophils Absolute 10/24/2017 0.0  0.0 - 0.7 K/uL Final  . Basophils Relative 10/24/2017 0  % Final  . Basophils Absolute 10/24/2017 0.0  0.0 - 0.1 K/uL Final  . WBC 10/25/2017 2.8* 4.0 - 10.5 K/uL Final  . RBC 10/25/2017 3.35* 4.22 - 5.81 MIL/uL Final  . Hemoglobin 10/25/2017 9.5* 13.0 - 17.0 g/dL Final  . HCT 10/25/2017 28.6* 39.0 - 52.0 % Final  . MCV 10/25/2017 85.4  78.0 - 100.0 fL Final  . MCH 10/25/2017 28.4  26.0 - 34.0 pg Final  . MCHC 10/25/2017 33.2  30.0 - 36.0 g/dL Final  . RDW 10/25/2017 13.5  11.5 - 15.5 % Final  . Platelets 10/25/2017 182  150 - 400 K/uL Final  . Sodium 10/25/2017 134* 135 - 145 mmol/L Final  . Potassium 10/25/2017 4.1  3.5 - 5.1 mmol/L Final  . Chloride 10/25/2017 97* 101 - 111 mmol/L Final  . CO2 10/25/2017 30  22 - 32 mmol/L Final  . Glucose, Bld 10/25/2017 141* 65 - 99 mg/dL Final  . BUN 10/25/2017 8  6 - 20 mg/dL Final  . Creatinine, Ser 10/25/2017 0.41* 0.61 - 1.24 mg/dL Final  . Calcium 10/25/2017 9.3  8.9 - 10.3 mg/dL Final  . GFR calc non Af Amer 10/25/2017 >60  >60 mL/min Final  . GFR calc Af Amer 10/25/2017 >60  >60 mL/min Final   Comment: (NOTE) The eGFR has been calculated using the CKD EPI equation. This calculation has not been validated in all clinical situations. eGFR's persistently <60 mL/min signify possible Chronic Kidney Disease.   . Anion gap 10/25/2017 7  5 - 15 Final  Appointment on 10/22/2017  Component Date Value Ref Range Status  . WBC 10/22/2017 2.0* 4.0 - 10.3 10e3/uL Final  . NEUT# 10/22/2017 1.1* 1.5 - 6.5 10e3/uL Final  . HGB 10/22/2017 10.0* 13.0 - 17.1 g/dL Final  . HCT 10/22/2017 30.1* 38.4 - 49.9 % Final  . Platelets 10/22/2017 216  140 - 400 10e3/uL Final  . MCV 10/22/2017 87.0  79.3 - 98.0 fL Final  . MCH 10/22/2017 28.9  27.2 - 33.4 pg Final  . MCHC 10/22/2017 33.2  32.0 - 36.0 g/dL Final  . RBC 10/22/2017 3.46* 4.20 - 5.82 10e6/uL Final  . RDW  10/22/2017 13.9  11.0 - 14.6 % Final  . lymph# 10/22/2017 0.2* 0.9 - 3.3 10e3/uL Final  . MONO# 10/22/2017 0.7  0.1 - 0.9 10e3/uL Final  . Eosinophils Absolute 10/22/2017 0.0  0.0 - 0.5 10e3/uL Final  . Basophils Absolute 10/22/2017 0.0  0.0 - 0.1 10e3/uL Final  . NEUT% 10/22/2017 53.4  39.0 - 75.0 % Final  . LYMPH% 10/22/2017 10.9* 14.0 - 49.0 % Final  . MONO% 10/22/2017 34.4* 0.0 - 14.0 % Final  . EOS% 10/22/2017 1.1  0.0 - 7.0 % Final  . BASO% 10/22/2017 0.2  0.0 - 2.0 % Final       Ardath Sax, MD

## 2017-11-03 NOTE — Assessment & Plan Note (Signed)
57 y.o. male with recent diagnosis of squamous cell carcinoma of the left tonsil treated with systemic chemotherapy with carboplatin and paclitaxel administered concurrently with radiotherapy.  Presents to the clinic today due to symptoms of fever and malaise.  Febrile in the clinic with temperature of 100.8.  Appears generally unwell, without localizing infectious signs   Plan: --Stat CBC with differential, if neutropenic, will transfer to emergency room for evaluation and potential admission.

## 2017-11-05 ENCOUNTER — Ambulatory Visit
Admission: RE | Admit: 2017-11-05 | Discharge: 2017-11-05 | Disposition: A | Discharge status: Home / Self Care | Payer: Medicaid Other | Source: Ambulatory Visit | Attending: Radiation Oncology | Admitting: Radiation Oncology

## 2017-11-05 ENCOUNTER — Encounter: Payer: Self-pay | Admitting: Radiation Oncology

## 2017-11-05 VITALS — BP 90/63 | HR 97 | Temp 99.6°F | Ht 67.0 in | Wt 118.8 lb

## 2017-11-05 DIAGNOSIS — Z923 Personal history of irradiation: Secondary | ICD-10-CM | POA: Insufficient documentation

## 2017-11-05 DIAGNOSIS — Z79899 Other long term (current) drug therapy: Secondary | ICD-10-CM | POA: Diagnosis not present

## 2017-11-05 DIAGNOSIS — C099 Malignant neoplasm of tonsil, unspecified: Secondary | ICD-10-CM | POA: Diagnosis not present

## 2017-11-05 HISTORY — DX: Personal history of irradiation: Z92.3

## 2017-11-05 NOTE — Progress Notes (Signed)
Radiation Oncology         [phone] ________________________________  Name: [name] MRN: [id number]  Date: 11/05/2017  DOB: [birth year]  Follow-Up Visit Note  Outpatient  CC: [facility]  Melissa Montane, MD  Diagnosis and Prior Radiotherapy:    ICD-10-CM   1. Squamous cell carcinoma of left tonsil (HCC) C09.9     57 y.o. 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 09/01/2017 - 10/25/2017; Left tonsil and bilateral neck / 70 Gy in 35 fractions total  CHIEF COMPLAINT: Here for follow-up and surveillance of tonsil cancer  Narrative:  The patient returns today for routine follow-up of his left tonsil and bilateral neck. Completed radiation on 10/25/17.    Patient underwent imagining of the chest - CXR - on 10/22/17 which demonstrated hyperexpansion without acute cardiopulmonary findings.  On review of symptoms, patient reported his throat getting better.    ALLERGIES:  has No Known Allergies.  Meds: Current Outpatient Medications  Medication Sig Dispense Refill  . acetaminophen (TYLENOL) 325 MG tablet Take 650 mg by mouth every 6 (six) hours as needed for moderate pain.     Marland Kitchen lidocaine-prilocaine (EMLA) cream Apply to affected area once 30 g 3  . Nutritional Supplements (FEEDING SUPPLEMENT, OSMOLITE 1.5 CAL,) LIQD Give 1 bottle Osmolite 1.5 via PEG 5 times daily with 60 mL free water before and after bolus. Drink or flush tube with additional 720 mL free water 3 times daily. 5 Bottle 0  . oxyCODONE (OXY IR/ROXICODONE) 5 MG immediate release tablet 1 to 2 Q 4 hours prn pain 40 tablet 0  . Wound Dressings (SONAFINE) Apply 1 application topically 2 (two) times daily.    Marland Kitchen dexamethasone (DECADRON) 4 MG tablet Take 2 tablets (8 mg total) daily by mouth. Start the day after chemotherapy for 2 days. (Patient not taking: Reported on 11/05/2017) 30 tablet 1  . ferrous sulfate 325 (65 FE) MG tablet Take 1 tablet (325 mg total) by mouth  daily. (Patient not taking: Reported on 11/05/2017) 30 tablet 0  . ibuprofen (ADVIL,MOTRIN) 200 MG tablet Take 200 mg by mouth daily as needed (PAIN).    Marland Kitchen lidocaine (XYLOCAINE) 2 % solution Mix 1 part 2%viscous lidocaine,1part H2O.Swish and/or swallow 110mL of this mixture,45min before meals and at bedtime, up to QID (Patient not taking: Reported on 11/05/2017) 100 mL 5  . LORazepam (ATIVAN) 0.5 MG tablet Take 1 tablet (0.5 mg total) every 6 (six) hours as needed by mouth (Nausea or vomiting). (Patient not taking: Reported on 11/05/2017) 30 tablet 0  . Morphine Sulfate (MORPHINE CONCENTRATE) 10 mg / 0.5 ml concentrated solution Place 0.5 mLs (10 mg total) into feeding tube every 4 (four) hours as needed for severe pain. (Patient not taking: Reported on 11/05/2017) 1 Bottle 0  . phenol (CHLORASEPTIC) 1.4 % LIQD Use as directed 1 spray in the mouth or throat as needed for throat irritation / pain. (Patient not taking: Reported on 11/05/2017) 1 Bottle 0   No current facility-administered medications for this encounter.     Physical Findings: The patient is in no acute distress. Patient is alert and oriented.  height is 5\' 7"  (1.702 m) and weight is 118 lb 12.8 oz (53.9 kg). His temperature is 99.6 F (37.6 C). His blood pressure is 90/63 and his pulse is 97. His oxygen saturation is 100%. Marland Kitchen    HEENT: Resolving mucositis behind his tongue, no signs of infections. Neck: skin healing well  Lab Findings: Lab Results  Component Value Date   WBC 2.8 (L) 10/25/2017   HGB 9.5 (L) 10/25/2017   HCT 28.6 (L) 10/25/2017   MCV 85.4 10/25/2017   PLT 182 10/25/2017    Radiographic Findings: Dg Chest 2 View  Result Date: 10/22/2017 CLINICAL DATA:  Head and neck cancer. EXAM: CHEST  2 VIEW COMPARISON:  10/06/2017 FINDINGS: Lungs are hyperexpanded. The lungs are clear without focal pneumonia, edema, pneumothorax or pleural effusion. The cardiopericardial silhouette is within normal limits for size. Right  Port-A-Cath tip overlies the distal SVC. The visualized bony structures of the thorax are intact. Telemetry leads overlie the chest. IMPRESSION: Hyperexpansion without acute cardiopulmonary findings. Electronically Signed   By: Misty Stanley M.D.   On: 10/22/2017 18:27    Impression/Plan:  57 y.o. 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   Recommended patient to decrease 94mL dose of Fluconazole to take 2.5 mL - 79mL daily  for one week.  I also recommended him to eat meals with high calories for weight gain like ice cream, cream of chicken soup etc.  Patient has an appointment with SLP rehabilitation on 11/08/17. [place] f/u requested for Feb.  I will see him in about three months. PET scan will be done one day before scheduled follow up appointment.   _____________________________________   Eppie Gibson, MD  This document serves as a record of services personally performed by Eppie Gibson MD. It was created on her behalf by Delton Coombes, a trained medical scribe. The creation of this record is based on the scribe's personal observations and the provider's statements to them.

## 2017-11-08 ENCOUNTER — Ambulatory Visit: Payer: Medicaid Other | Attending: Radiation Oncology

## 2017-11-08 ENCOUNTER — Other Ambulatory Visit: Payer: Self-pay

## 2017-11-08 ENCOUNTER — Ambulatory Visit: Payer: Self-pay | Admitting: Hematology and Oncology

## 2017-11-08 ENCOUNTER — Encounter: Payer: Self-pay | Admitting: Radiation Oncology

## 2017-11-08 ENCOUNTER — Other Ambulatory Visit: Payer: Self-pay | Admitting: Radiation Oncology

## 2017-11-08 DIAGNOSIS — C099 Malignant neoplasm of tonsil, unspecified: Secondary | ICD-10-CM

## 2017-11-08 DIAGNOSIS — Z1329 Encounter for screening for other suspected endocrine disorder: Secondary | ICD-10-CM

## 2017-11-08 DIAGNOSIS — C090 Malignant neoplasm of tonsillar fossa: Secondary | ICD-10-CM

## 2017-11-22 ENCOUNTER — Encounter (HOSPITAL_COMMUNITY): Payer: Self-pay | Admitting: Dentistry

## 2017-11-22 ENCOUNTER — Ambulatory Visit (HOSPITAL_COMMUNITY): Payer: Medicaid - Dental | Admitting: Dentistry

## 2017-11-22 VITALS — BP 107/69 | HR 78 | Temp 99.1°F | Wt 117.0 lb

## 2017-11-22 DIAGNOSIS — Z5189 Encounter for other specified aftercare: Secondary | ICD-10-CM

## 2017-11-22 DIAGNOSIS — C099 Malignant neoplasm of tonsil, unspecified: Secondary | ICD-10-CM

## 2017-11-22 DIAGNOSIS — K08109 Complete loss of teeth, unspecified cause, unspecified class: Secondary | ICD-10-CM

## 2017-11-22 DIAGNOSIS — Z923 Personal history of irradiation: Secondary | ICD-10-CM

## 2017-11-22 DIAGNOSIS — K0820 Unspecified atrophy of edentulous alveolar ridge: Secondary | ICD-10-CM

## 2017-11-22 DIAGNOSIS — R1310 Dysphagia, unspecified: Secondary | ICD-10-CM

## 2017-11-22 DIAGNOSIS — K117 Disturbances of salivary secretion: Secondary | ICD-10-CM

## 2017-11-22 DIAGNOSIS — R432 Parageusia: Secondary | ICD-10-CM

## 2017-11-22 DIAGNOSIS — R682 Dry mouth, unspecified: Secondary | ICD-10-CM

## 2017-11-22 NOTE — Patient Instructions (Signed)
RECOMMENDATIONS: 1. Brush tongue daily. 2. Use trismus exercises as directed. 3. Use Biotene Rinse or salt water/baking soda rinses. 4. Multiple sips of water as needed. 5. Follow-up with a dentist of his choice for fabrication of upper and lower complete dentures in 2 months. Do not start before January 23, 2018.  Patient was given the names of several dentists that do take Medicaid for denture fabrication. Patient to call and schedule follow-up Appointment.  Lenn Cal, DDS

## 2017-11-22 NOTE — Progress Notes (Signed)
11/22/2017  Patient Name:   [name] Date of Birth:   01-26-1961 Medical Record Number: [id number]  BP 107/69 (BP Location: Right Arm)   Pulse 78   Temp 99.1 F (37.3 C)   Wt 117 lb (53.1 kg)   BMI 18.32 kg/m   [name] presents for oral examination after radiation therapy. Patient has completed all radiation treatments from 08/22/2017 thru 10/25/2017.  REVIEW OF CHIEF COMPLAINTS:  DRY MOUTH: yes. HARD TO SWALLOW: yes, at times.  HURT TO SWALLOW: yes, at times. TASTE CHANGES: taste is slowly returning. SORES IN MOUTH: no sores TRISMUS: no trismus symptoms. WEIGHT: 117 pounds down from initial 140 lbs.  HOME OH REGIMEN:  BRUSHING: Edentulous. Patient instructed to brush his tongue daily. FLOSSING: Not applicable RINSING:   Using salt water rinses as needed. FLUORIDE: Not applicable. TRISMUS EXERCISES:  Maximum interincisal opening: 50 mm   DENTAL EXAM:  Oral Hygiene:(PLAQUE): Edentulous. Patient instructed to brush his tongue daily. LOCATION OF MUCOSITIS:  None noted. DESCRIPTION OF SALIVA:  Decreased saliva. Mild to moderate xerostomia. ANY EXPOSED BONE: None noted OTHER WATCHED AREAS: Previous extraction sites. Atrophy of edentulous alveolar ridges. DX: Xerostomia, Dysgeusia, Dysphagia, Odynophagia and Edentulous.  RECOMMENDATIONS: 1. Brush tongue daily. 2. Use trismus exercises as directed. 3. Use Biotene Rinse or salt water/baking soda rinses. 4. Multiple sips of water as needed. 5. Follow-up with a dentist of his choice for fabrication of upper and lower complete dentures in 2 months. Do not start before January 23, 2018.     Patient was given the names of several dentists that do take Medicaid for denture fabrication. Patient to call and schedule follow-up     Appointment.  Lenn Cal, DDS

## 2017-11-27 ENCOUNTER — Telehealth: Payer: Self-pay | Admitting: *Deleted

## 2017-11-27 NOTE — Telephone Encounter (Signed)
Oncology Nurse Navigator Documentation  Spoke with Mr. Candelas, he stated available to attend 2/12 H&N [place] for follow-up with SLP, Nutrition and PT.  I provided 0830 arrival to Radiation Waiting following lobby registration.  He voiced understanding.  Gayleen Orem, RN, BSN Head & Neck Oncology Nurse [place] at [place] [phone]

## 2017-11-30 ENCOUNTER — Encounter: Payer: Self-pay | Admitting: *Deleted

## 2017-11-30 ENCOUNTER — Ambulatory Visit
Admission: RE | Admit: 2017-11-30 | Discharge: 2017-11-30 | Disposition: A | Discharge status: Home / Self Care | Payer: Medicaid Other | Source: Ambulatory Visit | Attending: Radiation Oncology | Admitting: Radiation Oncology

## 2017-11-30 ENCOUNTER — Ambulatory Visit: Payer: Medicaid Other | Admitting: Physical Therapy

## 2017-11-30 ENCOUNTER — Ambulatory Visit: Payer: Medicaid Other | Attending: Radiation Oncology

## 2017-11-30 ENCOUNTER — Other Ambulatory Visit: Payer: Self-pay

## 2017-11-30 ENCOUNTER — Inpatient Hospital Stay: Payer: Medicaid Other | Attending: Hematology and Oncology | Admitting: Nutrition

## 2017-11-30 ENCOUNTER — Ambulatory Visit: Payer: Self-pay | Admitting: Radiation Oncology

## 2017-11-30 ENCOUNTER — Encounter: Payer: Self-pay | Admitting: Hematology and Oncology

## 2017-11-30 ENCOUNTER — Inpatient Hospital Stay (HOSPITAL_BASED_OUTPATIENT_CLINIC_OR_DEPARTMENT_OTHER): Payer: Medicaid Other | Admitting: Hematology and Oncology

## 2017-11-30 ENCOUNTER — Telehealth: Payer: Self-pay

## 2017-11-30 VITALS — BP 114/80 | HR 86 | Temp 97.7°F | Resp 18 | Ht 67.0 in | Wt 124.9 lb

## 2017-11-30 VITALS — BP 103/75 | HR 92 | Temp 98.3°F | Resp 18 | Wt 124.2 lb

## 2017-11-30 DIAGNOSIS — Z923 Personal history of irradiation: Secondary | ICD-10-CM | POA: Diagnosis not present

## 2017-11-30 DIAGNOSIS — Z79899 Other long term (current) drug therapy: Secondary | ICD-10-CM

## 2017-11-30 DIAGNOSIS — R070 Pain in throat: Secondary | ICD-10-CM

## 2017-11-30 DIAGNOSIS — Z9189 Other specified personal risk factors, not elsewhere classified: Secondary | ICD-10-CM | POA: Diagnosis present

## 2017-11-30 DIAGNOSIS — K117 Disturbances of salivary secretion: Secondary | ICD-10-CM | POA: Insufficient documentation

## 2017-11-30 DIAGNOSIS — R293 Abnormal posture: Secondary | ICD-10-CM

## 2017-11-30 DIAGNOSIS — Z87891 Personal history of nicotine dependence: Secondary | ICD-10-CM | POA: Insufficient documentation

## 2017-11-30 DIAGNOSIS — R1310 Dysphagia, unspecified: Secondary | ICD-10-CM | POA: Diagnosis present

## 2017-11-30 DIAGNOSIS — C099 Malignant neoplasm of tonsil, unspecified: Secondary | ICD-10-CM

## 2017-11-30 DIAGNOSIS — Z9221 Personal history of antineoplastic chemotherapy: Secondary | ICD-10-CM

## 2017-11-30 MED ORDER — ACETYLCYSTEINE 600 MG PO CAPS: 600.0000 mg | ORAL_CAPSULE | Freq: Two times a day (BID) | ORAL | 3 refills | Status: AC | PRN

## 2017-11-30 NOTE — Progress Notes (Signed)
Head and Neck [place] Clinical Social Work  Holiday representative met with patient in [facility]N [place] to offer support and assess for psychosocial needs.  Patient has completed treatment and did not express any needs or concerns at this time.  CSW provided patient with an updated patient and family support calendar and encouraged patient to call if he would like to participate in any support programs.  CSW provided contact information and encouraged patient to call with questions or concerns.   Johnnye Lana, MSW, LCSW, OSW-C Clinical Social Worker [facility] [phone]

## 2017-11-30 NOTE — Therapy (Signed)
[facility] [address] [place], [place], [address] Phone: [phone]   Fax:  [phone]  Speech Language Pathology Treatment  Patient Details  Name: [name] MRN: [id number] Date of Birth: July 20, 1961 Referring Provider: Eppie Gibson, MD   Encounter Date: 11/30/2017  End of Session - 11/30/17 0955    Visit Number  2    Number of Visits  4    Date for SLP Re-Evaluation  01/13/18    SLP Start Time  37    SLP Stop Time   0945    SLP Time Calculation (min)  30 min    Activity Tolerance  Patient tolerated treatment well       Past Medical History:  Diagnosis Date  . Cancer ([place])    left tonsil cancer  . History of radiation therapy 09/01/17- 10/25/17   Left tonsil and bilateral neck 70 Gy in 35 fractions to gross disease, 63 gy in 35 fractions to high risk nodal echelons, and 56 Gy in 35 fraction to intermediate risk nodal echelons.     Past Surgical History:  Procedure Laterality Date  . IR FLUORO GUIDE PORT INSERTION RIGHT  09/13/2017  . IR GASTROSTOMY TUBE MOD SED  09/13/2017  . IR US GUIDE VASC ACCESS RIGHT  09/13/2017  . MULTIPLE EXTRACTIONS WITH ALVEOLOPLASTY N/A 08/09/2017   Procedure: Extraction of tooth #'s 1-6, 11-17,21,22,26-30 and 32 with alveoloplasty and bilateral mandibular tori reductions;  Surgeon: Lenn Cal, DDS;  Location: [facility];  Service: Oral Surgery;  Laterality: N/A;  . tonsil biopsy     left tonsil    There were no vitals filed for this visit.  Subjective Assessment - 11/30/17 0919    Subjective  5 cans/day, drinks one Boost per day.     Currently in Pain?  No/denies            ADULT SLP TREATMENT - 11/30/17 0919      General Information   Behavior/Cognition  Alert;Cooperative;Pleasant mood      Treatment Provided   Treatment provided  Dysphagia      Dysphagia Treatment   Temperature Spikes Noted  No    Oral Cavity - Dentition  Edentulous    Patient  observed directly with PO's  Yes    Type of PO's observed  Dysphagia 1 (puree);Thin liquids    Liquids provided via  Cup    Oral Phase Signs & Symptoms  -- none noted    Pharyngeal Phase Signs & Symptoms  -- none noted    Other treatment/comments  Softer foods about once/day - reports he has to eat slower now. Pt reports doing the HEP every day, however did not recall any exercises on HEP sheet to perform for SLP. SLP then provided pt with a HEP handout and provided demo cues for pt as SLP performed and pt return demonstrated exercises on HEP.       Assessment / Recommendations / Plan   Plan  Continue with current plan of care      Progression Toward Goals   Progression toward goals  Progressing toward goals with aspiration PNA signs; noncompliant wiht HEP       SLP Education - 11/30/17 0955    Education provided  Yes    Education Details  HEP for dysphagia, late effects head/neck radiation on swallowing, overt s/s aspiration PNA    Person(s) Educated  Patient    Methods  Explanation;Demonstration;Verbal cues    Comprehension  Verbalized  understanding;Returned demonstration;Verbal cues required;Need further instruction         SLP Long Term Goals - 11/30/17 0958      SLP LONG TERM GOAL #1   Title  pt will complete HEP with modified independence over two sessions     Baseline  total A    Time  1    Period  -- visits    Status  On-going      SLP LONG TERM GOAL #2   Title  pt will tell SLP why he is completing HEP     Baseline  total A    Time  1    Period  -- visits    Status  On-going      SLP LONG TERM GOAL #3   Title  pt will tell how a food journal can expedite return to more normalized diet    Time  1    Period  -- visits    Status  Deferred       Plan - 11/30/17 0956    Clinical Impression Statement  Pt with oropharyngeal swallowing essentially WNL, however the probability of swallowing difficulty increases dramatically following chemo and radiation therapy. Pt  will need to be followed by SLP for regular assessment of accurate HEP completion as well as for safety with POs both during and following treatment/s.    Speech Therapy Frequency  -- once approx every 8 weeks    Duration  -- 1 more visit    Treatment/Interventions  Aspiration precaution training;Pharyngeal strengthening exercises;Diet toleration management by SLP;Trials of upgraded texture/liquids;Internal/external aids;Patient/family education;Compensatory strategies;Cueing hierarchy;Environmental controls;SLP instruction and feedback    Potential to Achieve Goals  Good    Potential Considerations  Cooperation/participation level    SLP Home Exercise Plan  provided today    Consulted and Agree with Plan of Care  Patient       Patient will benefit from skilled therapeutic intervention in order to improve the following deficits and impairments:   Dysphagia, unspecified type    Problem List Patient Active Problem List   Diagnosis Date Noted  . Fever 10/22/2017  . Leukopenia 10/22/2017  . Thrush 10/22/2017  . Severe protein-calorie malnutrition ([place]) 10/08/2017  . Hyponatremia 10/07/2017  . Anemia 10/07/2017  . Neutropenic fever ([place]) 10/06/2017  . Pancytopenia, acquired ([place]) 10/01/2017  . Weight loss 10/01/2017  . Alcohol withdrawal syndrome without complication ([place]) 52/77/8242  . Tobacco abuse 08/12/2017  . Squamous cell carcinoma of left tonsil ([place]) 08/03/2017    SCHINKE,CARL ,[place], [place]  11/30/2017, 10:01 AM  [facility] [address] [place], [place], [address] Phone: [phone]   Fax:  [phone]   Name: VU LIEBMAN MRN: [id number] Date of Birth: May 11, 1961

## 2017-11-30 NOTE — Progress Notes (Signed)
Patient presents to [facility]. Patient is S/P concurrent chemo radiation for tonsil cancer. Current weight documented as 124.4 pounds increased from 116.8 pounds December 27. Patient continues to use 6 bottles of Osmolite 1.5 daily with 60 mL free water before and after bolus feeding.  He is also drinking water throughout the day. Reports tolerating grits, [place], eggs and potatoes. Denies N,V,D, and C.  Nutrition Diagnosis: Severe Malnutrition improved.  Intervention: Educated patient to continue introducing soft, moist foods at meals. As oral intake increases, educated patient that he can reduce 1 bottle of Osmolite 1.5 gradually as oral intake increases. Reviewed appropriate foods to add into his meals and snacks. Questions answered and teach back method used.  Monitoring, Evaluation, Goals: Patient will increase oral intake and decrease TF to meet estimated nutrition needs.  Next Visit: To be scheduled as needed.

## 2017-11-30 NOTE — Therapy (Signed)
[place], [place], [address] Phone: [phone]   Fax:  [phone]  Physical Therapy Evaluation  Patient Details  Name: [name] MRN: [id number] Date of Birth: 1961-10-11 Referring Provider: Dr. Eppie Gibson   Encounter Date: 11/30/2017  PT End of Session - 11/30/17 0907    Visit Number  1    Number of Visits  1    PT Start Time  0835    PT Stop Time  0850    PT Time Calculation (min)  15 min    Activity Tolerance  Patient tolerated treatment well    Behavior During Therapy  [facility] for tasks assessed/performed       Past Medical History:  Diagnosis Date  . Cancer ([place])    left tonsil cancer  . History of radiation therapy 09/01/17- 10/25/17   Left tonsil and bilateral neck 70 Gy in 35 fractions to gross disease, 63 gy in 35 fractions to high risk nodal echelons, and 56 Gy in 35 fraction to intermediate risk nodal echelons.     Past Surgical History:  Procedure Laterality Date  . IR FLUORO GUIDE PORT INSERTION RIGHT  09/13/2017  . IR GASTROSTOMY TUBE MOD SED  09/13/2017  . IR US GUIDE VASC ACCESS RIGHT  09/13/2017  . MULTIPLE EXTRACTIONS WITH ALVEOLOPLASTY N/A 08/09/2017   Procedure: Extraction of tooth #'s 1-6, 11-17,21,22,26-30 and 32 with alveoloplasty and bilateral mandibular tori reductions;  Surgeon: Lenn Cal, DDS;  Location: [facility];  Service: Oral Surgery;  Laterality: N/A;  . tonsil biopsy     left tonsil    There were no vitals filed for this visit.   Subjective Assessment - 11/30/17 0856    Subjective  Doing okay.  Still riding the bike but slowed down a bit.  The stomach tube slows him down.    Pertinent History  Left tonsil squamous cell carcinoma, p16 negative.  Completed RT/chemo concurrent treatment 10/25/17 , RT to tumor and bilateral neck nodes.  Smoker, drinker.      Patient Stated Goals  follow-up with all head & neck clinic providers    Currently in Pain?   No/denies         [facility] PT Assessment - 11/30/17 0001      Assessment   Medical Diagnosis  left tonsil squamous cell carcinoma    Referring Provider  Dr. Eppie Gibson    Prior Therapy  seen in [facility] 08/31/17      Precautions   Precautions  Other (comment)    Precaution Comments  cancer precautions      Restrictions   Weight Bearing Restrictions  No      Home Environment   Living Environment  Private residence    Living Arrangements  Other (Comment) "lady friend"    Type of [place]  One level      Prior Function   Level of Independence  Independent    Leisure  rides his bike everywhere for transportation, about 20 miles/day; says he also does yardwork      Cognition   Overall Cognitive Status  Within Functional Limits for tasks assessed      Observation/Other Assessments   Observations  thin gentleman who is Chiropractor Movements are Fluid and Coordinated  Yes      Functional Tests   Functional tests  Sit to Stand  Sit to Stand   Comments  8 times in 30 seconds, well below average for age      Posture/Postural Control   Posture/Postural Control  Postural limitations    Postural Limitations  Rounded Shoulders;Forward head      AROM   Overall AROM Comments  both neck and shoulders grossly WFL pt. reports he is doing neck stretches      PROM   Overall PROM   Within functional limits for tasks performed      Ambulation/Gait   Ambulation/Gait  Yes    Ambulation/Gait Assistance  7: Independent        LYMPHEDEMA/ONCOLOGY QUESTIONNAIRE - 11/30/17 0900      Type   Cancer Type  --      Treatment   Past Chemotherapy Treatment  --    Past Radiation Treatment  --          Objective measurements completed on examination: See above findings.              PT Education - 11/30/17 0906    Education provided  Yes    Education Details  Discussed lymphedema: what it is, what it looks like, and that we  can help with it if it does develop (though doesn't look now like it has)    Person(s) Educated  Patient    Methods  Explanation    Comprehension  Verbalized understanding          PT Long Term Goals - 11/30/17 0913      PT LONG TERM GOAL #1   Title  Pt. will be knowledgeable about lymphedema risk and treatment available for it.    Status  Achieved         Head and Neck Clinic Goals - 08/31/17 1313      Patient will be able to verbalize understanding of a home exercise program for cervical range of motion, posture, and walking.    Status  Achieved      Patient will be able to verbalize understanding of proper sitting and standing posture.    Status  Achieved      Patient will be able to verbalize understanding of lymphedema risk and availability of treatment for this condition.    Status  Achieved         Plan - 11/30/17 0908    Clinical Impression Statement  This is a gentleman who was polite and attentive today.  He reports he has been doing his neck ROM exercises and does show AROM WFL in neck and shoulders today; he moves his arms a bit slowly, he reports because of the PEG tube pulling when he does that.  He still rides his bike, though more slowly now than previously. His neck measurements have not changed signficantly at 8 cm. superior to sternal notch, which is increased a few tenths of centimeters. He still scored low on 30 second sit to stand, but the same as he has done previously.    Clinical Presentation  Stable    Clinical Decision Making  Low    Rehab Potential  Good    PT Frequency  One time visit    PT Treatment/Interventions  Patient/family education    PT Next Visit Plan  No follow-up at this time; patient knows to watch for lymphedema and to request a referral if he notices swelling.    PT Home Exercise Plan  continue biking, neck ROM    Consulted and Agree with Plan of Care  Patient       Patient will benefit from skilled  therapeutic intervention in order to improve the following deficits and impairments:  Postural dysfunction, Decreased mobility  Visit Diagnosis: Squamous cell carcinoma of left tonsil (HCC) - Plan: PT plan of care cert/re-cert  At risk for lymphedema - Plan: PT plan of care cert/re-cert  Abnormal posture - Plan: PT plan of care cert/re-cert  Dysphagia, unspecified type - Plan: PT plan of care cert/re-cert     Problem List Patient Active Problem List   Diagnosis Date Noted  . Fever 10/22/2017  . Leukopenia 10/22/2017  . Thrush 10/22/2017  . Severe protein-calorie malnutrition ([place]) 10/08/2017  . Hyponatremia 10/07/2017  . Anemia 10/07/2017  . Neutropenic fever ([place]) 10/06/2017  . Pancytopenia, acquired ([place]) 10/01/2017  . Weight loss 10/01/2017  . Alcohol withdrawal syndrome without complication ([place]) 45/62/5638  . Tobacco abuse 08/12/2017  . Squamous cell carcinoma of left tonsil ([place]) 08/03/2017    Savian Mazon 11/30/2017, 9:16 AM  [facility] [place], [place], [address] Phone: [phone]   Fax:  [phone]  Name: [name] MRN: [id number] Date of Birth: 28-Sep-1961  Serafina Royals, PT 11/30/17 9:16 AM

## 2017-11-30 NOTE — Patient Instructions (Signed)
SLP provided another handout of HEP for pt  Signs of Aspiration Pneumonia   . Chest pain/tightness . Fever (can be low grade) . Cough  o With foul-smelling phlegm (sputum) o With sputum containing pus or blood o With greenish sputum . Fatigue  . Shortness of breath  . Wheezing   **IF YOU HAVE THESE SIGNS, CONTACT YOUR DOCTOR OR GO TO THE EMERGENCY DEPARTMENT OR URGENT CARE AS SOON AS POSSIBLE**

## 2017-12-01 NOTE — Progress Notes (Signed)
Oncology Nurse Navigator Documentation  Met with Mr. Poitras upon his arrival for H&N [place].  He was unaccompanied.  Provided verbal and written overview of [place], the clinicians who will be seeing him, encouraged him to ask questions during his time with them.  He was seen by Nutrition, SLP and PT for post-treatment follow-up. He understands to call me with concerns/needs.  Gayleen Orem, RN, BSN Head & Neck Oncology [place] at [place] [phone]

## 2017-12-16 ENCOUNTER — Inpatient Hospital Stay (HOSPITAL_BASED_OUTPATIENT_CLINIC_OR_DEPARTMENT_OTHER): Payer: Medicaid Other | Admitting: Hematology and Oncology

## 2017-12-16 ENCOUNTER — Encounter: Payer: Self-pay | Admitting: Hematology and Oncology

## 2017-12-16 VITALS — BP 113/73 | HR 77 | Temp 98.9°F | Resp 18 | Ht 67.0 in | Wt 128.9 lb

## 2017-12-16 DIAGNOSIS — Z9221 Personal history of antineoplastic chemotherapy: Secondary | ICD-10-CM

## 2017-12-16 DIAGNOSIS — C099 Malignant neoplasm of tonsil, unspecified: Secondary | ICD-10-CM

## 2017-12-16 DIAGNOSIS — Z923 Personal history of irradiation: Secondary | ICD-10-CM | POA: Diagnosis not present

## 2017-12-16 DIAGNOSIS — Z79899 Other long term (current) drug therapy: Secondary | ICD-10-CM

## 2017-12-16 NOTE — Progress Notes (Signed)
Patient refused to let this RN flush his port during visit.  He stated he would like to wait until his next visit.  MD aware.

## 2017-12-17 ENCOUNTER — Telehealth: Payer: Self-pay | Admitting: Hematology

## 2017-12-17 ENCOUNTER — Other Ambulatory Visit: Payer: Self-pay

## 2017-12-17 ENCOUNTER — Ambulatory Visit: Payer: Self-pay | Admitting: Hematology and Oncology

## 2017-12-17 NOTE — Telephone Encounter (Signed)
Letter/Calendar mailed to patient with updated appointments per 2/28 sch msg

## 2017-12-20 NOTE — Progress Notes (Signed)
[place] [facility] Follow-up Visit:  Assessment: Squamous cell carcinoma of left tonsil [facility]) 57 y.o. male with recent diagnosis of squamous cell carcinoma of the left tonsil treated with systemic chemotherapy with carboplatin and paclitaxel administered concurrently with radiotherapy.  The end of the treatment was complicated by recurrent neutropenic infections for which patient has been admitted to the hospital previously.  Presently, appears to be recovering well, but continues to have residual symptoms including thick secretions that are hard to clear in the absence of adequate saliva production due to xerostomia.  Plan: -Acetylcysteine for thick mucus. -Return to my clinic in 2 weeks for continued symptom management.    Voice recognition software was used and creation of this note. Despite my best effort at editing the text, some misspelling/errors may have occurred.  No orders of the defined types were placed in this encounter.   Cancer Staging Squamous cell carcinoma of left tonsil ([place]) Staging form: Pharynx - P16 Negative Oropharynx, AJCC 8th Edition - Clinical stage from 08/06/2017: Stage III (cT3, cN0, cM0, p16: Negative) - Signed by Eppie Gibson, MD on 11/08/2017   All questions were answered. . The patient knows to call the clinic with any problems, questions or concerns.  This note was electronically signed.    History of Presenting Illness [name] is a 57 y.o. male followed in the [place] for diagnosis of squamous cell carcinoma of the left tonsil. Patient initially presented with sore throat onset around the middle of June. He first presented to the Urgent Care on 04/26/17 after 3 weeks of symptoms. The sore throat was also accompanied by the globus sensation in the neck. He was treated for uvulitis initially, wihtout significant improvement despite two additional visits to the Urgent Care. On his last visit with the [facility]gent Care[facility] referral  was made.  The patient saw Dr. Janace Hoard from  [facility] and underwent laryngoscopy on 07/16/17 with biopsy which confirmed presence of squamous cell carcinoma. Please see results of additional evaluation in the oncological history below. At the present time, additional assessment is pending. Patient was seen by dentistry and multiple dental extractions were recommended. He underwent surgical removal of his teeth on 08/09/17.    Patient returns to the clinic for recovery monitoring.  Patient continues to have phlegm and burning in his throat when he goes to sleep.  Otherwise, oral intake and physical activity levels are improving.  Denies any new complaints.  Oncological/hematological History:   Squamous cell carcinoma of left tonsil ([place])   07/16/2017 Initial Diagnosis    Squamous cell carcinoma of left tonsil ([place])      07/16/2017 Pathology Results    Diagnosis: Tonsil, biopsy, left mass -- SQUAMOUS CELL CARCINOMA; The biopsy has at least squamous cell carcinoma in-situ. There are foci suspicious but not definitive for invasion. p16 immunohistochemistry is negative in the neoplastic cells. FINAL DIAGNOSIS       07/27/2017 Imaging    CT neck: Indistinct tonsillar enlargement on the left in the region of left tonsillar mass biopsy. This could be a combination of post biopsy edema and residual mass. Margins are not discrete and accurate measurement cannot be performed. This is estimated at about 3 cm in size. No evidence of metastatic adenopathy.      08/16/2017 Imaging    PET-CT: Hypermetabolic disease in the left palatal fossa.  Indeterminate level 3 lymph node which may be reactive considering recent dental extractions.      09/01/2017 -  Radiation  Therapy         09/01/2017 -  Chemotherapy    Carbboplatin AUC2,d1 + Paclitaxel '45mg'$ /m2, d1 Q7d --Week #1, 61/95/09: Complicated by some burning/soreness in the throat. --Week #2, 09/08/17: --Week #3, 09/15/17: --Week #4,  09/22/17: --Week #5, 32/67/12: Complicated by admission for neutropenic fever 12/19-22/18 --Week #6, 10/15/17:      09/13/2017 Procedure    Successful fluoroscopic insertion of a 20-French pull-through gastrostomy tube. Successful placement of a right internal jugular approach power injectable Port-A-Cath.       Medical History: Past Medical History:  Diagnosis Date  . Cancer ([place])    left tonsil cancer  . History of radiation therapy 09/01/17- 10/25/17   Left tonsil and bilateral neck 70 Gy in 35 fractions to gross disease, 63 gy in 35 fractions to high risk nodal echelons, and 56 Gy in 35 fraction to intermediate risk nodal echelons.     Surgical History: Past Surgical History:  Procedure Laterality Date  . IR FLUORO GUIDE PORT INSERTION RIGHT  09/13/2017  . IR GASTROSTOMY TUBE MOD SED  09/13/2017  . IR US GUIDE VASC ACCESS RIGHT  09/13/2017  . MULTIPLE EXTRACTIONS WITH ALVEOLOPLASTY N/A 08/09/2017   Procedure: Extraction of tooth #'s 1-6, 11-17,21,22,26-30 and 32 with alveoloplasty and bilateral mandibular tori reductions;  Surgeon: Lenn Cal, DDS;  Location: [facility];  Service: Oral Surgery;  Laterality: N/A;  . tonsil biopsy     left tonsil    Family History: Family History  Problem Relation Age of Onset  . Diabetes Mother     Social History: Social History   Socioeconomic History  . Marital status: Single    Spouse name: Not on file  . Number of children: 1  . Years of education: Not on file  . Highest education level: Not on file  Social Needs  . Financial resource strain: Not on file  . Food insecurity - worry: Not on file  . Food insecurity - inability: Not on file  . Transportation needs - medical: Not on file  . Transportation needs - non-medical: Not on file  Occupational History  . Occupation: Disabled  Tobacco Use  . Smoking status: Former Smoker    Packs/day: 1.00    Years: 30.00    Pack years: 30.00    Types: Cigarettes  . Smokeless  tobacco: Never Used  Substance and Sexual Activity  . Alcohol use: Yes    Alcohol/week: 4.2 oz    Types: 7 Cans of beer per week    Comment: 1 pint daily   . Drug use: No    Comment: he has a past history of cocaine abuse, he denies current use  . Sexual activity: Not on file  Other Topics Concern  . Not on file  Social History Narrative  . Not on file    Allergies: No Known Allergies  Medications:  Current Outpatient Medications  Medication Sig Dispense Refill  . acetaminophen (TYLENOL) 325 MG tablet Take 650 mg by mouth every 6 (six) hours as needed for moderate pain.     Marland Kitchen ibuprofen (ADVIL,MOTRIN) 200 MG tablet Take 200 mg by mouth daily as needed (PAIN).    Marland Kitchen lidocaine-prilocaine (EMLA) cream Apply to affected area once 30 g 3  . Nutritional Supplements (FEEDING SUPPLEMENT, OSMOLITE 1.5 CAL,) LIQD Give 1 bottle Osmolite 1.5 via PEG 5 times daily with 60 mL free water before and after bolus. Drink or flush tube with additional 720 mL free water 3 times daily. 5  Bottle 0  . oxyCODONE (OXY IR/ROXICODONE) 5 MG immediate release tablet 1 to 2 Q 4 hours prn pain 40 tablet 0  . Wound Dressings (SONAFINE) Apply 1 application topically 2 (two) times daily.    . phenol (CHLORASEPTIC) 1.4 % LIQD Use as directed 1 spray in the mouth or throat as needed for throat irritation / pain. 1 Bottle 0   No current facility-administered medications for this visit.     Review of Systems: Review of Systems  HENT:   Positive for sore throat. Negative for trouble swallowing.   All other systems reviewed and are negative.    PHYSICAL EXAMINATION Blood pressure 114/80, pulse 86, temperature 97.7 F (36.5 C), temperature source Oral, resp. rate 18, height '5\' 7"'$  (1.702 m), weight 124 lb 14.4 oz (56.7 kg), SpO2 100 %.  ECOG PERFORMANCE STATUS: 1 - Symptomatic but completely ambulatory  Physical Exam  Constitutional: He is oriented to person, place, and time and well-developed, well-nourished, and  in no distress. No distress.  HENT:  Head: Normocephalic and atraumatic.  Mouth/Throat: Uvula is midline, oropharynx is clear and moist and mucous membranes are normal. He does not have dentures. Oral lesions present. Abnormal dentition. Dental caries present. No dental abscesses or uvula swelling. No oropharyngeal exudate or tonsillar abscesses.  Eyes: Conjunctivae and EOM are normal. Pupils are equal, round, and reactive to light. No scleral icterus.  Neck: No thyromegaly present.  Cardiovascular: Normal rate, regular rhythm, normal heart sounds and intact distal pulses.  No murmur heard. Pulmonary/Chest: Effort normal. He has no wheezes. He has no rales.  Abdominal: Soft. Bowel sounds are normal. He exhibits no distension and no mass. There is no tenderness. There is no rebound and no guarding.  Musculoskeletal: Normal range of motion. He exhibits no edema.  Lymphadenopathy:    He has no cervical adenopathy.  Neurological: He is alert and oriented to person, place, and time. He has normal reflexes. No cranial nerve deficit. Coordination normal.  Skin: Skin is warm. No rash noted. He is not diaphoretic. No erythema.     LABORATORY DATA: I have personally reviewed the data as listed: No visits with results within 1 Week(s) from this visit.  Latest known visit with results is:  Admission on 10/22/2017, Discharged on 10/25/2017  Component Date Value Ref Range Status  . Streptococcus, Group A Screen (Dir* 10/22/2017 NEGATIVE  NEGATIVE Final   Comment: (NOTE) A Rapid Antigen test may result negative if the antigen level in the sample is below the detection level of this test. The FDA has not cleared this test as a stand-alone test therefore the rapid antigen negative result has reflexed to a Group A Strep culture.   . Lactic Acid, Venous 10/22/2017 0.69  0.5 - 1.9 mmol/L Final  . Sodium 10/22/2017 133* 135 - 145 mmol/L Final  . Potassium 10/22/2017 3.9  3.5 - 5.1 mmol/L Final  .  Chloride 10/22/2017 95* 101 - 111 mmol/L Final  . CO2 10/22/2017 29  22 - 32 mmol/L Final  . Glucose, Bld 10/22/2017 112* 65 - 99 mg/dL Final  . BUN 10/22/2017 10  6 - 20 mg/dL Final  . Creatinine, Ser 10/22/2017 0.49* 0.61 - 1.24 mg/dL Final  . Calcium 10/22/2017 9.4  8.9 - 10.3 mg/dL Final  . Total Protein 10/22/2017 6.8  6.5 - 8.1 g/dL Final  . Albumin 10/22/2017 3.2* 3.5 - 5.0 g/dL Final  . AST 10/22/2017 19  15 - 41 U/L Final  . ALT 10/22/2017 17  17 - 63 U/L Final  . Alkaline Phosphatase 10/22/2017 65  38 - 126 U/L Final  . Total Bilirubin 10/22/2017 0.7  0.3 - 1.2 mg/dL Final  . GFR calc non Af Amer 10/22/2017 >60  >60 mL/min Final  . GFR calc Af Amer 10/22/2017 >60  >60 mL/min Final   Comment: (NOTE) The eGFR has been calculated using the CKD EPI equation. This calculation has not been validated in all clinical situations. eGFR's persistently <60 mL/min signify possible Chronic Kidney Disease.   . Anion gap 10/22/2017 9  5 - 15 Final  . WBC 10/22/2017 2.2* 4.0 - 10.5 K/uL Final  . RBC 10/22/2017 3.44* 4.22 - 5.81 MIL/uL Final  . Hemoglobin 10/22/2017 9.9* 13.0 - 17.0 g/dL Final  . HCT 10/22/2017 29.5* 39.0 - 52.0 % Final  . MCV 10/22/2017 85.8  78.0 - 100.0 fL Final  . MCH 10/22/2017 28.8  26.0 - 34.0 pg Final  . MCHC 10/22/2017 33.6  30.0 - 36.0 g/dL Final  . RDW 10/22/2017 13.6  11.5 - 15.5 % Final  . Platelets 10/22/2017 213  150 - 400 K/uL Final  . Neutrophils Relative % 10/22/2017 53  % Final  . Neutro Abs 10/22/2017 1.2* 1.7 - 7.7 K/uL Final  . Lymphocytes Relative 10/22/2017 29  % Final  . Lymphs Abs 10/22/2017 0.7  0.7 - 4.0 K/uL Final  . Monocytes Relative 10/22/2017 17  % Final  . Monocytes Absolute 10/22/2017 0.4  0.1 - 1.0 K/uL Final  . Eosinophils Relative 10/22/2017 1  % Final  . Eosinophils Absolute 10/22/2017 0.0  0.0 - 0.7 K/uL Final  . Basophils Relative 10/22/2017 0  % Final  . Basophils Absolute 10/22/2017 0.0  0.0 - 0.1 K/uL Final  . Specimen  Description 10/22/2017 BLOOD PORTA CATH   Final  . Special Requests 10/22/2017 BOTTLES DRAWN AEROBIC AND ANAEROBIC Blood Culture adequate volume   Final  . Culture 10/22/2017    Final                   Value:NO GROWTH 5 DAYS Performed at [facility], [place] [address] [place], [place] [address]   . Report Status 10/22/2017 10/27/2017 FINAL   Final  . Specimen Description 10/22/2017 BLOOD LEFT ARM   Final  . Special Requests 10/22/2017 BOTTLES DRAWN AEROBIC AND ANAEROBIC Blood Culture adequate volume   Final  . Culture 10/22/2017    Final                   Value:NO GROWTH 5 DAYS Performed at [facility], [place] [address] [place], [place] [address]   . Report Status 10/22/2017 10/27/2017 FINAL   Final  . Color, Urine 10/22/2017 YELLOW  YELLOW Final  . APPearance 10/22/2017 CLEAR  CLEAR Final  . Specific Gravity, Urine 10/22/2017 1.017  1.005 - 1.030 Final  . pH 10/22/2017 6.0  5.0 - 8.0 Final  . Glucose, UA 10/22/2017 NEGATIVE  NEGATIVE mg/dL Final  . Hgb urine dipstick 10/22/2017 NEGATIVE  NEGATIVE Final  . Bilirubin Urine 10/22/2017 NEGATIVE  NEGATIVE Final  . Ketones, ur 10/22/2017 NEGATIVE  NEGATIVE mg/dL Final  . Protein, ur 10/22/2017 NEGATIVE  NEGATIVE mg/dL Final  . Nitrite 10/22/2017 NEGATIVE  NEGATIVE Final  . Leukocytes, UA 10/22/2017 NEGATIVE  NEGATIVE Final  . Specimen Description 10/22/2017 URINE, RANDOM   Final  . Special Requests 10/22/2017 NONE   Final  . Culture 10/22/2017    Final  Value:NO GROWTH Performed at [facility], [place] [address], [place], [place] [address]   . Report Status 10/22/2017 10/24/2017 FINAL   Final  . Influenza A By PCR 10/22/2017 NEGATIVE  NEGATIVE Final  . Influenza B By PCR 10/22/2017 NEGATIVE  NEGATIVE Final   Comment: (NOTE) The Xpert Xpress Flu assay is intended as an aid in the diagnosis of  influenza and should not be used as a sole basis for treatment.  This  assay is FDA approved for nasopharyngeal  swab specimens only. Nasal  washings and aspirates are unacceptable for Xpert Xpress Flu testing.   . Sodium 10/23/2017 134* 135 - 145 mmol/L Final  . Potassium 10/23/2017 3.7  3.5 - 5.1 mmol/L Final  . Chloride 10/23/2017 100* 101 - 111 mmol/L Final  . CO2 10/23/2017 29  22 - 32 mmol/L Final  . Glucose, Bld 10/23/2017 102* 65 - 99 mg/dL Final  . BUN 10/23/2017 9  6 - 20 mg/dL Final  . Creatinine, Ser 10/23/2017 0.41* 0.61 - 1.24 mg/dL Final  . Calcium 10/23/2017 8.6* 8.9 - 10.3 mg/dL Final  . Total Protein 10/23/2017 6.1* 6.5 - 8.1 g/dL Final  . Albumin 10/23/2017 2.9* 3.5 - 5.0 g/dL Final  . AST 10/23/2017 17  15 - 41 U/L Final  . ALT 10/23/2017 14* 17 - 63 U/L Final  . Alkaline Phosphatase 10/23/2017 54  38 - 126 U/L Final  . Total Bilirubin 10/23/2017 0.5  0.3 - 1.2 mg/dL Final  . GFR calc non Af Amer 10/23/2017 >60  >60 mL/min Final  . GFR calc Af Amer 10/23/2017 >60  >60 mL/min Final   Comment: (NOTE) The eGFR has been calculated using the CKD EPI equation. This calculation has not been validated in all clinical situations. eGFR's persistently <60 mL/min signify possible Chronic Kidney Disease.   . Anion gap 10/23/2017 5  5 - 15 Final  . WBC 10/23/2017 2.1* 4.0 - 10.5 K/uL Final  . RBC 10/23/2017 3.10* 4.22 - 5.81 MIL/uL Final  . Hemoglobin 10/23/2017 9.0* 13.0 - 17.0 g/dL Final  . HCT 10/23/2017 26.8* 39.0 - 52.0 % Final  . MCV 10/23/2017 86.5  78.0 - 100.0 fL Final  . MCH 10/23/2017 29.0  26.0 - 34.0 pg Final  . MCHC 10/23/2017 33.6  30.0 - 36.0 g/dL Final  . RDW 10/23/2017 13.7  11.5 - 15.5 % Final  . Platelets 10/23/2017 181  150 - 400 K/uL Final  . Neutrophils Relative % 10/23/2017 54  % Final  . Neutro Abs 10/23/2017 1.1* 1.7 - 7.7 K/uL Final  . Lymphocytes Relative 10/23/2017 24  % Final  . Lymphs Abs 10/23/2017 0.5* 0.7 - 4.0 K/uL Final  . Monocytes Relative 10/23/2017 20  % Final  . Monocytes Absolute 10/23/2017 0.4  0.1 - 1.0 K/uL Final  . Eosinophils Relative  10/23/2017 1  % Final  . Eosinophils Absolute 10/23/2017 0.0  0.0 - 0.7 K/uL Final  . Basophils Relative 10/23/2017 1  % Final  . Basophils Absolute 10/23/2017 0.0  0.0 - 0.1 K/uL Final  . Adenovirus 10/23/2017 NOT DETECTED  NOT DETECTED Final  . Coronavirus 229E 10/23/2017 NOT DETECTED  NOT DETECTED Final  . Coronavirus HKU1 10/23/2017 NOT DETECTED  NOT DETECTED Final  . Coronavirus NL63 10/23/2017 NOT DETECTED  NOT DETECTED Final  . Coronavirus OC43 10/23/2017 NOT DETECTED  NOT DETECTED Final  . Metapneumovirus 10/23/2017 NOT DETECTED  NOT DETECTED Final  . Rhinovirus / Enterovirus 10/23/2017 NOT DETECTED  NOT  DETECTED Final  . Influenza A 10/23/2017 NOT DETECTED  NOT DETECTED Final  . Influenza B 10/23/2017 NOT DETECTED  NOT DETECTED Final  . Parainfluenza Virus 1 10/23/2017 NOT DETECTED  NOT DETECTED Final  . Parainfluenza Virus 2 10/23/2017 NOT DETECTED  NOT DETECTED Final  . Parainfluenza Virus 3 10/23/2017 NOT DETECTED  NOT DETECTED Final  . Parainfluenza Virus 4 10/23/2017 NOT DETECTED  NOT DETECTED Final  . Respiratory Syncytial Virus 10/23/2017 NOT DETECTED  NOT DETECTED Final  . Bordetella pertussis 10/23/2017 NOT DETECTED  NOT DETECTED Final  . Chlamydophila pneumoniae 10/23/2017 NOT DETECTED  NOT DETECTED Final  . Mycoplasma pneumoniae 10/23/2017 NOT DETECTED  NOT DETECTED Final   Performed at [name], [place] [address], [place], [place] [address]  . Sodium 10/24/2017 133* 135 - 145 mmol/L Final  . Potassium 10/24/2017 4.0  3.5 - 5.1 mmol/L Final  . Chloride 10/24/2017 96* 101 - 111 mmol/L Final  . CO2 10/24/2017 31  22 - 32 mmol/L Final  . Glucose, Bld 10/24/2017 116* 65 - 99 mg/dL Final  . BUN 10/24/2017 9  6 - 20 mg/dL Final  . Creatinine, Ser 10/24/2017 0.41* 0.61 - 1.24 mg/dL Final  . Calcium 10/24/2017 8.9  8.9 - 10.3 mg/dL Final  . GFR calc non Af Amer 10/24/2017 >60  >60 mL/min Final  . GFR calc Af Amer 10/24/2017 >60  >60 mL/min Final   Comment:  (NOTE) The eGFR has been calculated using the CKD EPI equation. This calculation has not been validated in all clinical situations. eGFR's persistently <60 mL/min signify possible Chronic Kidney Disease.   . Anion gap 10/24/2017 6  5 - 15 Final  . WBC 10/24/2017 2.9* 4.0 - 10.5 K/uL Final  . RBC 10/24/2017 3.15* 4.22 - 5.81 MIL/uL Final  . Hemoglobin 10/24/2017 9.0* 13.0 - 17.0 g/dL Final  . HCT 10/24/2017 26.8* 39.0 - 52.0 % Final  . MCV 10/24/2017 85.1  78.0 - 100.0 fL Final  . MCH 10/24/2017 28.6  26.0 - 34.0 pg Final  . MCHC 10/24/2017 33.6  30.0 - 36.0 g/dL Final  . RDW 10/24/2017 13.7  11.5 - 15.5 % Final  . Platelets 10/24/2017 185  150 - 400 K/uL Final  . Neutrophils Relative % 10/24/2017 68  % Final  . Neutro Abs 10/24/2017 2.0  1.7 - 7.7 K/uL Final  . Lymphocytes Relative 10/24/2017 16  % Final  . Lymphs Abs 10/24/2017 0.5* 0.7 - 4.0 K/uL Final  . Monocytes Relative 10/24/2017 15  % Final  . Monocytes Absolute 10/24/2017 0.5  0.1 - 1.0 K/uL Final  . Eosinophils Relative 10/24/2017 1  % Final  . Eosinophils Absolute 10/24/2017 0.0  0.0 - 0.7 K/uL Final  . Basophils Relative 10/24/2017 0  % Final  . Basophils Absolute 10/24/2017 0.0  0.0 - 0.1 K/uL Final  . WBC 10/25/2017 2.8* 4.0 - 10.5 K/uL Final  . RBC 10/25/2017 3.35* 4.22 - 5.81 MIL/uL Final  . Hemoglobin 10/25/2017 9.5* 13.0 - 17.0 g/dL Final  . HCT 10/25/2017 28.6* 39.0 - 52.0 % Final  . MCV 10/25/2017 85.4  78.0 - 100.0 fL Final  . MCH 10/25/2017 28.4  26.0 - 34.0 pg Final  . MCHC 10/25/2017 33.2  30.0 - 36.0 g/dL Final  . RDW 10/25/2017 13.5  11.5 - 15.5 % Final  . Platelets 10/25/2017 182  150 - 400 K/uL Final  . Sodium 10/25/2017 134* 135 - 145 mmol/L Final  . Potassium 10/25/2017 4.1  3.5 - 5.1 mmol/L Final  . Chloride 10/25/2017 97* 101 - 111 mmol/L Final  . CO2 10/25/2017 30  22 - 32 mmol/L Final  . Glucose, Bld 10/25/2017 141* 65 - 99 mg/dL Final  . BUN 10/25/2017 8  6 - 20 mg/dL Final  . Creatinine,  Ser 10/25/2017 0.41* 0.61 - 1.24 mg/dL Final  . Calcium 10/25/2017 9.3  8.9 - 10.3 mg/dL Final  . GFR calc non Af Amer 10/25/2017 >60  >60 mL/min Final  . GFR calc Af Amer 10/25/2017 >60  >60 mL/min Final   Comment: (NOTE) The eGFR has been calculated using the CKD EPI equation. This calculation has not been validated in all clinical situations. eGFR's persistently <60 mL/min signify possible Chronic Kidney Disease.   Georgiann Hahn gap 10/25/2017 7  5 - 15 Final       Ardath Sax, MD

## 2017-12-20 NOTE — Assessment & Plan Note (Signed)
57 y.o. male with recent diagnosis of squamous cell carcinoma of the left tonsil treated with systemic chemotherapy with carboplatin and paclitaxel administered concurrently with radiotherapy.  The end of the treatment was complicated by recurrent neutropenic infections for which patient has been admitted to the hospital previously.  Presently, appears to be recovering well, but continues to have residual symptoms including thick secretions that are hard to clear in the absence of adequate saliva production due to xerostomia.  Plan: -Acetylcysteine for thick mucus. -Return to my clinic in 2 weeks for continued symptom management.

## 2017-12-27 NOTE — Assessment & Plan Note (Signed)
57 y.o. male with recent diagnosis of squamous cell carcinoma of the left tonsil treated with systemic chemotherapy with carboplatin and paclitaxel administered concurrently with radiotherapy.  The end of the treatment was complicated by recurrent neutropenic infections for which patient has been admitted to the hospital previously.  Presently, appears to be recovering well, but continues to have residual symptoms including thick secretions that are hard to clear in the absence of adequate saliva production due to xerostomia.  Acetylcysteine has been helpful with mucolysis.  Plan: -Continue acetylcysteine. -Return to my clinic on 01/28/18 to review restaging PET/CT being ordered by Dr. Isidore Moos.

## 2017-12-27 NOTE — Progress Notes (Signed)
Ong Cancer Follow-up Visit:  Assessment: No problem-specific Assessment & Plan notes found for this encounter.  Voice recognition software was used and creation of this note. Despite my best effort at editing the text, some misspelling/errors may have occurred.  Orders Placed This Encounter  Procedures  . CBC with Differential (Cancer Center Only)    Standing Status:   Future    Standing Expiration Date:   12/16/2018  . CMP ([facility] only)    Standing Status:   Future    Standing Expiration Date:   12/16/2018  . Magnesium    Standing Status:   Future    Standing Expiration Date:   12/16/2018    Cancer Staging Squamous cell carcinoma of left tonsil (HCC) Staging form: Pharynx - P16 Negative Oropharynx, AJCC 8th Edition - Clinical stage from 08/06/2017: Stage III (cT3, cN0, cM0, p16: Negative) - Signed by Eppie Gibson, MD on 11/08/2017   All questions were answered. . The patient knows to call the clinic with any problems, questions or concerns.  This note was electronically signed.    History of Presenting Illness [name] is a 57 y.o. male followed in the [facility] for diagnosis of squamous cell carcinoma of the left tonsil. Patient initially presented with sore throat onset around the middle of June. He first presented to the Urgent Care on 04/26/17 after 3 weeks of symptoms. The sore throat was also accompanied by the globus sensation in the neck. He was treated for uvulitis initially, wihtout significant improvement despite two additional visits to the Urgent Care. On his last visit with the Urgent CareENT referral was made.  The patient saw Dr. Janace Hoard from [facility] and underwent laryngoscopy on 07/16/17 with biopsy which confirmed presence of squamous cell carcinoma. Please see results of additional evaluation in the oncological history below. At the present time, additional assessment is pending. Patient was seen by dentistry  and multiple dental extractions were recommended. He underwent surgical removal of his teeth on 08/09/17.    Patient returns to the clinic for recovery monitoring.  Patient continues to have phlegm and burning in his throat when he goes to sleep.  Otherwise, oral intake and physical activity levels are improving.  Denies any new complaints.  Oncological/hematological History:   Squamous cell carcinoma of left tonsil ([place])   07/16/2017 Initial Diagnosis    Squamous cell carcinoma of left tonsil ([place])      07/16/2017 Pathology Results    Diagnosis: Tonsil, biopsy, left mass -- SQUAMOUS CELL CARCINOMA; The biopsy has at least squamous cell carcinoma in-situ. There are foci suspicious but not definitive for invasion. p16 immunohistochemistry is negative in the neoplastic cells. FINAL DIAGNOSIS       07/27/2017 Imaging    CT neck: Indistinct tonsillar enlargement on the left in the region of left tonsillar mass biopsy. This could be a combination of post biopsy edema and residual mass. Margins are not discrete and accurate measurement cannot be performed. This is estimated at about 3 cm in size. No evidence of metastatic adenopathy.      08/16/2017 Imaging    PET-CT: Hypermetabolic disease in the left palatal fossa.  Indeterminate level 3 lymph node which may be reactive considering recent dental extractions.      09/01/2017 -  Radiation Therapy         09/01/2017 -  Chemotherapy    Carbboplatin AUC2,d1 + Paclitaxel '45mg'$ /m2, d1 Q7d --Week #1, 26/37/85: Complicated by some burning/soreness in  the throat. --Week #2, 09/08/17: --Week #3, 09/15/17: --Week #4, 09/22/17: --Week #5, 99/83/38: Complicated by admission for neutropenic fever 12/19-22/18 --Week #6, 10/15/17:      09/13/2017 Procedure    Successful fluoroscopic insertion of a 20-French pull-through gastrostomy tube. Successful placement of a right internal jugular approach power injectable Port-A-Cath.       Medical  History: Past Medical History:  Diagnosis Date  . Cancer ([place])    left tonsil cancer  . History of radiation therapy 09/01/17- 10/25/17   Left tonsil and bilateral neck 70 Gy in 35 fractions to gross disease, 63 gy in 35 fractions to high risk nodal echelons, and 56 Gy in 35 fraction to intermediate risk nodal echelons.     Surgical History: Past Surgical History:  Procedure Laterality Date  . IR FLUORO GUIDE PORT INSERTION RIGHT  09/13/2017  . IR GASTROSTOMY TUBE MOD SED  09/13/2017  . IR US GUIDE VASC ACCESS RIGHT  09/13/2017  . MULTIPLE EXTRACTIONS WITH ALVEOLOPLASTY N/A 08/09/2017   Procedure: Extraction of tooth #'s 1-6, 11-17,21,22,26-30 and 32 with alveoloplasty and bilateral mandibular tori reductions;  Surgeon: Lenn Cal, DDS;  Location: [facility];  Service: Oral Surgery;  Laterality: N/A;  . tonsil biopsy     left tonsil    Family History: Family History  Problem Relation Age of Onset  . Diabetes Mother     Social History: Social History   Socioeconomic History  . Marital status: Single    Spouse name: Not on file  . Number of children: 1  . Years of education: Not on file  . Highest education level: Not on file  Social Needs  . Financial resource strain: Not on file  . Food insecurity - worry: Not on file  . Food insecurity - inability: Not on file  . Transportation needs - medical: Not on file  . Transportation needs - non-medical: Not on file  Occupational History  . Occupation: Disabled  Tobacco Use  . Smoking status: Former Smoker    Packs/day: 1.00    Years: 30.00    Pack years: 30.00    Types: Cigarettes  . Smokeless tobacco: Never Used  Substance and Sexual Activity  . Alcohol use: Yes    Alcohol/week: 4.2 oz    Types: 7 Cans of beer per week    Comment: 1 pint daily   . Drug use: No    Comment: he has a past history of cocaine abuse, he denies current use  . Sexual activity: Not on file  Other Topics Concern  . Not on file  Social  History Narrative  . Not on file    Allergies: No Known Allergies  Medications:  Current Outpatient Medications  Medication Sig Dispense Refill  . acetaminophen (TYLENOL) 325 MG tablet Take 650 mg by mouth every 6 (six) hours as needed for moderate pain.     Marland Kitchen ibuprofen (ADVIL,MOTRIN) 200 MG tablet Take 200 mg by mouth daily as needed (PAIN).    Marland Kitchen lidocaine-prilocaine (EMLA) cream Apply to affected area once 30 g 3  . Nutritional Supplements (FEEDING SUPPLEMENT, OSMOLITE 1.5 CAL,) LIQD Give 1 bottle Osmolite 1.5 via PEG 5 times daily with 60 mL free water before and after bolus. Drink or flush tube with additional 720 mL free water 3 times daily. 5 Bottle 0  . oxyCODONE (OXY IR/ROXICODONE) 5 MG immediate release tablet 1 to 2 Q 4 hours prn pain 40 tablet 0  . phenol (CHLORASEPTIC) 1.4 % LIQD Use  as directed 1 spray in the mouth or throat as needed for throat irritation / pain. 1 Bottle 0  . Wound Dressings (SONAFINE) Apply 1 application topically 2 (two) times daily.     No current facility-administered medications for this visit.     Review of Systems: Review of Systems  HENT:   Negative for sore throat and trouble swallowing.   All other systems reviewed and are negative.    PHYSICAL EXAMINATION Blood pressure 113/73, pulse 77, temperature 98.9 F (37.2 C), temperature source Oral, resp. rate 18, height '5\' 7"'$  (1.702 m), weight 128 lb 14.4 oz (58.5 kg), SpO2 100 %.  ECOG PERFORMANCE STATUS: 1 - Symptomatic but completely ambulatory  Physical Exam  Constitutional: He is oriented to person, place, and time and well-developed, well-nourished, and in no distress. No distress.  HENT:  Head: Normocephalic and atraumatic.  Mouth/Throat: Uvula is midline, oropharynx is clear and moist and mucous membranes are normal. He does not have dentures. Oral lesions present. Abnormal dentition. Dental caries present. No dental abscesses or uvula swelling. No oropharyngeal exudate or tonsillar  abscesses.  Eyes: Conjunctivae and EOM are normal. Pupils are equal, round, and reactive to light. No scleral icterus.  Neck: No thyromegaly present.  Cardiovascular: Normal rate, regular rhythm, normal heart sounds and intact distal pulses.  No murmur heard. Pulmonary/Chest: Effort normal. He has no wheezes. He has no rales.  Abdominal: Soft. Bowel sounds are normal. He exhibits no distension and no mass. There is no tenderness. There is no rebound and no guarding.  Musculoskeletal: Normal range of motion. He exhibits no edema.  Lymphadenopathy:    He has no cervical adenopathy.  Neurological: He is alert and oriented to person, place, and time. He has normal reflexes. No cranial nerve deficit. Coordination normal.  Skin: Skin is warm. No rash noted. He is not diaphoretic. No erythema.     LABORATORY DATA: I have personally reviewed the data as listed: No visits with results within 1 Week(s) from this visit.  Latest known visit with results is:  Admission on 10/22/2017, Discharged on 10/25/2017  Component Date Value Ref Range Status  . Streptococcus, Group A Screen (Dir* 10/22/2017 NEGATIVE  NEGATIVE Final   Comment: (NOTE) A Rapid Antigen test may result negative if the antigen level in the sample is below the detection level of this test. The FDA has not cleared this test as a stand-alone test therefore the rapid antigen negative result has reflexed to a Group A Strep culture.   . Lactic Acid, Venous 10/22/2017 0.69  0.5 - 1.9 mmol/L Final  . Sodium 10/22/2017 133* 135 - 145 mmol/L Final  . Potassium 10/22/2017 3.9  3.5 - 5.1 mmol/L Final  . Chloride 10/22/2017 95* 101 - 111 mmol/L Final  . CO2 10/22/2017 29  22 - 32 mmol/L Final  . Glucose, Bld 10/22/2017 112* 65 - 99 mg/dL Final  . BUN 10/22/2017 10  6 - 20 mg/dL Final  . Creatinine, Ser 10/22/2017 0.49* 0.61 - 1.24 mg/dL Final  . Calcium 10/22/2017 9.4  8.9 - 10.3 mg/dL Final  . Total Protein 10/22/2017 6.8  6.5 - 8.1 g/dL  Final  . Albumin 10/22/2017 3.2* 3.5 - 5.0 g/dL Final  . AST 10/22/2017 19  15 - 41 U/L Final  . ALT 10/22/2017 17  17 - 63 U/L Final  . Alkaline Phosphatase 10/22/2017 65  38 - 126 U/L Final  . Total Bilirubin 10/22/2017 0.7  0.3 - 1.2 mg/dL Final  .  GFR calc non Af Amer 10/22/2017 >60  >60 mL/min Final  . GFR calc Af Amer 10/22/2017 >60  >60 mL/min Final   Comment: (NOTE) The eGFR has been calculated using the CKD EPI equation. This calculation has not been validated in all clinical situations. eGFR's persistently <60 mL/min signify possible Chronic Kidney Disease.   . Anion gap 10/22/2017 9  5 - 15 Final  . WBC 10/22/2017 2.2* 4.0 - 10.5 K/uL Final  . RBC 10/22/2017 3.44* 4.22 - 5.81 MIL/uL Final  . Hemoglobin 10/22/2017 9.9* 13.0 - 17.0 g/dL Final  . HCT 10/22/2017 29.5* 39.0 - 52.0 % Final  . MCV 10/22/2017 85.8  78.0 - 100.0 fL Final  . MCH 10/22/2017 28.8  26.0 - 34.0 pg Final  . MCHC 10/22/2017 33.6  30.0 - 36.0 g/dL Final  . RDW 10/22/2017 13.6  11.5 - 15.5 % Final  . Platelets 10/22/2017 213  150 - 400 K/uL Final  . Neutrophils Relative % 10/22/2017 53  % Final  . Neutro Abs 10/22/2017 1.2* 1.7 - 7.7 K/uL Final  . Lymphocytes Relative 10/22/2017 29  % Final  . Lymphs Abs 10/22/2017 0.7  0.7 - 4.0 K/uL Final  . Monocytes Relative 10/22/2017 17  % Final  . Monocytes Absolute 10/22/2017 0.4  0.1 - 1.0 K/uL Final  . Eosinophils Relative 10/22/2017 1  % Final  . Eosinophils Absolute 10/22/2017 0.0  0.0 - 0.7 K/uL Final  . Basophils Relative 10/22/2017 0  % Final  . Basophils Absolute 10/22/2017 0.0  0.0 - 0.1 K/uL Final  . Specimen Description 10/22/2017 BLOOD PORTA CATH   Final  . Special Requests 10/22/2017 BOTTLES DRAWN AEROBIC AND ANAEROBIC Blood Culture adequate volume   Final  . Culture 10/22/2017    Final                   Value:NO GROWTH 5 DAYS Performed at [facility], [place] [address] [place], [place] [address]   . Report Status 10/22/2017 10/27/2017 FINAL    Final  . Specimen Description 10/22/2017 BLOOD LEFT ARM   Final  . Special Requests 10/22/2017 BOTTLES DRAWN AEROBIC AND ANAEROBIC Blood Culture adequate volume   Final  . Culture 10/22/2017    Final                   Value:NO GROWTH 5 DAYS Performed at [facility], [place] [address] [place], [place] [address]   . Report Status 10/22/2017 10/27/2017 FINAL   Final  . Color, Urine 10/22/2017 YELLOW  YELLOW Final  . APPearance 10/22/2017 CLEAR  CLEAR Final  . Specific Gravity, Urine 10/22/2017 1.017  1.005 - 1.030 Final  . pH 10/22/2017 6.0  5.0 - 8.0 Final  . Glucose, UA 10/22/2017 NEGATIVE  NEGATIVE mg/dL Final  . Hgb urine dipstick 10/22/2017 NEGATIVE  NEGATIVE Final  . Bilirubin Urine 10/22/2017 NEGATIVE  NEGATIVE Final  . Ketones, ur 10/22/2017 NEGATIVE  NEGATIVE mg/dL Final  . Protein, ur 10/22/2017 NEGATIVE  NEGATIVE mg/dL Final  . Nitrite 10/22/2017 NEGATIVE  NEGATIVE Final  . Leukocytes, UA 10/22/2017 NEGATIVE  NEGATIVE Final  . Specimen Description 10/22/2017 URINE, RANDOM   Final  . Special Requests 10/22/2017 NONE   Final  . Culture 10/22/2017    Final                   Value:NO GROWTH Performed at [facility], [place] [address] [place], [place] [address]   .  Report Status 10/22/2017 10/24/2017 FINAL   Final  . Influenza A By PCR 10/22/2017 NEGATIVE  NEGATIVE Final  . Influenza B By PCR 10/22/2017 NEGATIVE  NEGATIVE Final   Comment: (NOTE) The Xpert Xpress Flu assay is intended as an aid in the diagnosis of  influenza and should not be used as a sole basis for treatment.  This  assay is FDA approved for nasopharyngeal swab specimens only. Nasal  washings and aspirates are unacceptable for Xpert Xpress Flu testing.   . Sodium 10/23/2017 134* 135 - 145 mmol/L Final  . Potassium 10/23/2017 3.7  3.5 - 5.1 mmol/L Final  . Chloride 10/23/2017 100* 101 - 111 mmol/L Final  . CO2 10/23/2017 29  22 - 32 mmol/L Final  . Glucose, Bld 10/23/2017 102* 65 - 99 mg/dL Final   . BUN 10/23/2017 9  6 - 20 mg/dL Final  . Creatinine, Ser 10/23/2017 0.41* 0.61 - 1.24 mg/dL Final  . Calcium 10/23/2017 8.6* 8.9 - 10.3 mg/dL Final  . Total Protein 10/23/2017 6.1* 6.5 - 8.1 g/dL Final  . Albumin 10/23/2017 2.9* 3.5 - 5.0 g/dL Final  . AST 10/23/2017 17  15 - 41 U/L Final  . ALT 10/23/2017 14* 17 - 63 U/L Final  . Alkaline Phosphatase 10/23/2017 54  38 - 126 U/L Final  . Total Bilirubin 10/23/2017 0.5  0.3 - 1.2 mg/dL Final  . GFR calc non Af Amer 10/23/2017 >60  >60 mL/min Final  . GFR calc Af Amer 10/23/2017 >60  >60 mL/min Final   Comment: (NOTE) The eGFR has been calculated using the CKD EPI equation. This calculation has not been validated in all clinical situations. eGFR's persistently <60 mL/min signify possible Chronic Kidney Disease.   . Anion gap 10/23/2017 5  5 - 15 Final  . WBC 10/23/2017 2.1* 4.0 - 10.5 K/uL Final  . RBC 10/23/2017 3.10* 4.22 - 5.81 MIL/uL Final  . Hemoglobin 10/23/2017 9.0* 13.0 - 17.0 g/dL Final  . HCT 10/23/2017 26.8* 39.0 - 52.0 % Final  . MCV 10/23/2017 86.5  78.0 - 100.0 fL Final  . MCH 10/23/2017 29.0  26.0 - 34.0 pg Final  . MCHC 10/23/2017 33.6  30.0 - 36.0 g/dL Final  . RDW 10/23/2017 13.7  11.5 - 15.5 % Final  . Platelets 10/23/2017 181  150 - 400 K/uL Final  . Neutrophils Relative % 10/23/2017 54  % Final  . Neutro Abs 10/23/2017 1.1* 1.7 - 7.7 K/uL Final  . Lymphocytes Relative 10/23/2017 24  % Final  . Lymphs Abs 10/23/2017 0.5* 0.7 - 4.0 K/uL Final  . Monocytes Relative 10/23/2017 20  % Final  . Monocytes Absolute 10/23/2017 0.4  0.1 - 1.0 K/uL Final  . Eosinophils Relative 10/23/2017 1  % Final  . Eosinophils Absolute 10/23/2017 0.0  0.0 - 0.7 K/uL Final  . Basophils Relative 10/23/2017 1  % Final  . Basophils Absolute 10/23/2017 0.0  0.0 - 0.1 K/uL Final  . Adenovirus 10/23/2017 NOT DETECTED  NOT DETECTED Final  . Coronavirus 229E 10/23/2017 NOT DETECTED  NOT DETECTED Final  . Coronavirus HKU1 10/23/2017 NOT  DETECTED  NOT DETECTED Final  . Coronavirus NL63 10/23/2017 NOT DETECTED  NOT DETECTED Final  . Coronavirus OC43 10/23/2017 NOT DETECTED  NOT DETECTED Final  . Metapneumovirus 10/23/2017 NOT DETECTED  NOT DETECTED Final  . Rhinovirus / Enterovirus 10/23/2017 NOT DETECTED  NOT DETECTED Final  . Influenza A 10/23/2017 NOT DETECTED  NOT DETECTED Final  . Influenza B 10/23/2017  NOT DETECTED  NOT DETECTED Final  . Parainfluenza Virus 1 10/23/2017 NOT DETECTED  NOT DETECTED Final  . Parainfluenza Virus 2 10/23/2017 NOT DETECTED  NOT DETECTED Final  . Parainfluenza Virus 3 10/23/2017 NOT DETECTED  NOT DETECTED Final  . Parainfluenza Virus 4 10/23/2017 NOT DETECTED  NOT DETECTED Final  . Respiratory Syncytial Virus 10/23/2017 NOT DETECTED  NOT DETECTED Final  . Bordetella pertussis 10/23/2017 NOT DETECTED  NOT DETECTED Final  . Chlamydophila pneumoniae 10/23/2017 NOT DETECTED  NOT DETECTED Final  . Mycoplasma pneumoniae 10/23/2017 NOT DETECTED  NOT DETECTED Final   Performed at [facility], [place] [address], [place], [place] [address]  . Sodium 10/24/2017 133* 135 - 145 mmol/L Final  . Potassium 10/24/2017 4.0  3.5 - 5.1 mmol/L Final  . Chloride 10/24/2017 96* 101 - 111 mmol/L Final  . CO2 10/24/2017 31  22 - 32 mmol/L Final  . Glucose, Bld 10/24/2017 116* 65 - 99 mg/dL Final  . BUN 10/24/2017 9  6 - 20 mg/dL Final  . Creatinine, Ser 10/24/2017 0.41* 0.61 - 1.24 mg/dL Final  . Calcium 10/24/2017 8.9  8.9 - 10.3 mg/dL Final  . GFR calc non Af Amer 10/24/2017 >60  >60 mL/min Final  . GFR calc Af Amer 10/24/2017 >60  >60 mL/min Final   Comment: (NOTE) The eGFR has been calculated using the CKD EPI equation. This calculation has not been validated in all clinical situations. eGFR's persistently <60 mL/min signify possible Chronic Kidney Disease.   . Anion gap 10/24/2017 6  5 - 15 Final  . WBC 10/24/2017 2.9* 4.0 - 10.5 K/uL Final  . RBC 10/24/2017 3.15* 4.22 - 5.81 MIL/uL Final  .  Hemoglobin 10/24/2017 9.0* 13.0 - 17.0 g/dL Final  . HCT 10/24/2017 26.8* 39.0 - 52.0 % Final  . MCV 10/24/2017 85.1  78.0 - 100.0 fL Final  . MCH 10/24/2017 28.6  26.0 - 34.0 pg Final  . MCHC 10/24/2017 33.6  30.0 - 36.0 g/dL Final  . RDW 10/24/2017 13.7  11.5 - 15.5 % Final  . Platelets 10/24/2017 185  150 - 400 K/uL Final  . Neutrophils Relative % 10/24/2017 68  % Final  . Neutro Abs 10/24/2017 2.0  1.7 - 7.7 K/uL Final  . Lymphocytes Relative 10/24/2017 16  % Final  . Lymphs Abs 10/24/2017 0.5* 0.7 - 4.0 K/uL Final  . Monocytes Relative 10/24/2017 15  % Final  . Monocytes Absolute 10/24/2017 0.5  0.1 - 1.0 K/uL Final  . Eosinophils Relative 10/24/2017 1  % Final  . Eosinophils Absolute 10/24/2017 0.0  0.0 - 0.7 K/uL Final  . Basophils Relative 10/24/2017 0  % Final  . Basophils Absolute 10/24/2017 0.0  0.0 - 0.1 K/uL Final  . WBC 10/25/2017 2.8* 4.0 - 10.5 K/uL Final  . RBC 10/25/2017 3.35* 4.22 - 5.81 MIL/uL Final  . Hemoglobin 10/25/2017 9.5* 13.0 - 17.0 g/dL Final  . HCT 10/25/2017 28.6* 39.0 - 52.0 % Final  . MCV 10/25/2017 85.4  78.0 - 100.0 fL Final  . MCH 10/25/2017 28.4  26.0 - 34.0 pg Final  . MCHC 10/25/2017 33.2  30.0 - 36.0 g/dL Final  . RDW 10/25/2017 13.5  11.5 - 15.5 % Final  . Platelets 10/25/2017 182  150 - 400 K/uL Final  . Sodium 10/25/2017 134* 135 - 145 mmol/L Final  . Potassium 10/25/2017 4.1  3.5 - 5.1 mmol/L Final  . Chloride 10/25/2017 97* 101 - 111 mmol/L Final  . CO2  10/25/2017 30  22 - 32 mmol/L Final  . Glucose, Bld 10/25/2017 141* 65 - 99 mg/dL Final  . BUN 10/25/2017 8  6 - 20 mg/dL Final  . Creatinine, Ser 10/25/2017 0.41* 0.61 - 1.24 mg/dL Final  . Calcium 10/25/2017 9.3  8.9 - 10.3 mg/dL Final  . GFR calc non Af Amer 10/25/2017 >60  >60 mL/min Final  . GFR calc Af Amer 10/25/2017 >60  >60 mL/min Final   Comment: (NOTE) The eGFR has been calculated using the CKD EPI equation. This calculation has not been validated in all clinical  situations. eGFR's persistently <60 mL/min signify possible Chronic Kidney Disease.   Georgiann Hahn gap 10/25/2017 7  5 - 15 Final       Ardath Sax, MD

## 2017-12-30 ENCOUNTER — Encounter: Payer: Self-pay | Admitting: Gastroenterology

## 2018-01-17 ENCOUNTER — Ambulatory Visit: Payer: Medicaid Other | Attending: Radiation Oncology

## 2018-01-17 DIAGNOSIS — R1310 Dysphagia, unspecified: Secondary | ICD-10-CM | POA: Insufficient documentation

## 2018-01-17 NOTE — Therapy (Signed)
[facility] [address] [place], [place], [address] Phone: [phone]   Fax:  [phone]  Speech Language Pathology Treatment  Patient Details  Name: [name] MRN: [id number] Date of Birth: 1961-08-15 Referring Provider: Eppie Gibson, MD   Encounter Date: 01/17/2018  End of Session - 01/17/18 1232    Visit Number  3    Number of Visits  4    Date for SLP Re-Evaluation  01/17/18    Authorization Type  Medicaid    SLP Start Time  8341    SLP Stop Time   1225    SLP Time Calculation (min)  40 min    Activity Tolerance  Patient tolerated treatment well       Past Medical History:  Diagnosis Date  . Cancer ([place])    left tonsil cancer  . History of radiation therapy 09/01/17- 10/25/17   Left tonsil and bilateral neck 70 Gy in 35 fractions to gross disease, 63 gy in 35 fractions to high risk nodal echelons, and 56 Gy in 35 fraction to intermediate risk nodal echelons.     Past Surgical History:  Procedure Laterality Date  . IR FLUORO GUIDE PORT INSERTION RIGHT  09/13/2017  . IR GASTROSTOMY TUBE MOD SED  09/13/2017  . IR US GUIDE VASC ACCESS RIGHT  09/13/2017  . MULTIPLE EXTRACTIONS WITH ALVEOLOPLASTY N/A 08/09/2017   Procedure: Extraction of tooth #'s 1-6, 11-17,21,22,26-30 and 32 with alveoloplasty and bilateral mandibular tori reductions;  Surgeon: Lenn Cal, DDS;  Location: [facility];  Service: Oral Surgery;  Laterality: N/A;  . tonsil biopsy     left tonsil    There were no vitals filed for this visit.  Subjective Assessment - 01/17/18 1152    Subjective  Pt eating soft foods daily, still at 5 cans tube feed/day. "I'm not gonna let that milk go to waste."    Currently in Pain?  No/denies            ADULT SLP TREATMENT - 01/17/18 1156      General Information   Behavior/Cognition  Alert;Cooperative;Pleasant mood      Treatment Provided   Treatment provided  Dysphagia       Dysphagia Treatment   Oral Cavity - Dentition  Edentulous    Patient observed directly with PO's  Yes    Type of PO's observed  Dysphagia 1 (puree);Thin liquids    Oral Phase Signs & Symptoms  -- none noted    Pharyngeal Phase Signs & Symptoms  -- none noted    Other treatment/comments  Pt stated he was going to finish the shipment of tube feeding he has currently and then "get them to pull this (PEG) out" because pt stated he was maintaining weight. SLP educated pt that he will have to maintain weight with POs only before MDs will want to remove PEG. Since pt was safe with dys I-II and thin liquids, SLP recommended pt eat POs and donate tube feeding to nutritionist. Pt stated he was eager to have tube removed due to sleep disruption. SLP recommended some dys I, II, and III foods for pt when he receives his dentures this week or next. Pt reports to SLP doing HEP every other day, x1/day. SLP told pt BID completion was recommended, every day. Pt told SLP rationale for HEP correctly. SLP palpated pt's neck musculature and musculature from chin to hyoid appeared firm. SLP reiterated the need for HEP AT LEAST once/day and that  BID was recommended. He req'd max A usually with all exercises. Overt s/s aspiraiton PNA was provided, along with another handout for HEP.      Assessment / Recommendations / Plan   Plan  Discharge SLP treatment due to (comment) pt noncompliant with HEP;safe with POs      Dysphagia Recommendations   Diet recommendations  Thin liquid;Dysphagia 2 (fine chop);Dysphagia 1 (puree) diet as tolerated/thin liquids    Liquids provided via  Cup    Medication Administration  Whole meds with puree    Supervision  Patient able to self feed    Compensations  Small sips/bites       SLP Education - 01/17/18 1231    Education provided  Yes    Education Details  HEP procedure, need for HEP EVERY DAY, AT LEAST once a day, overt s/s aspiration PNA, HEP for dysphagia, PEG likely removed when pt  can maintain weight with PO diet (and not tube feeds)    Person(s) Educated  Patient    Methods  Demonstration;Verbal cues;Handout;Explanation    Comprehension  Verbalized understanding;Returned demonstration;Verbal cues required         SLP Long Term Goals - 01/17/18 1235      SLP LONG TERM GOAL #1   Title  pt will complete HEP with modified independence over two sessions     Baseline  total A    Status  Not Met      SLP LONG TERM GOAL #2   Title  pt will tell SLP why he is completing HEP     Baseline  total A    Status  Achieved      SLP LONG TERM GOAL #3   Title  pt will tell how a food journal can expedite return to more normalized diet    Status  Deferred       Plan - 01/17/18 1232    Clinical Impression Statement  Pt with oropharyngeal swallowing essentially WNL for dys I and thin, However the probability of swallowing difficulty increases following chemo and radiation therapy. Pt tells SLP he has completed HEP every other day but continues to require max cues for all exercises. SLP strongly suspects pt has been noncompiant with HEP. SLP provided pt education today - see "pt edcuation" for details. Pt told SLP he was comfortable maintaining HEP on his own and that he was ok with this being his last day of ST. Pt is currently safe with at least dys I-II items and thin liquids.     Treatment/Interventions  Aspiration precaution training;Pharyngeal strengthening exercises;Diet toleration management by SLP;Trials of upgraded texture/liquids;Internal/external aids;Patient/family education;Compensatory strategies;Cueing hierarchy;Environmental controls;SLP instruction and feedback    Potential to Achieve Goals  Good    Potential Considerations  Cooperation/participation level    Consulted and Agree with Plan of Care  Patient       Patient will benefit from skilled therapeutic intervention in order to improve the following deficits and impairments:   Dysphagia, unspecified  type   SPEECH THERAPY RENEWAL/DISCHARGE SUMMARY  Visits from Start of Care: 3  Current functional level related to goals / functional outcomes: Pt was seen today for his second ST session since evaluation. See "skilled intervention" for details. Pt did not demo any of HEP without at least max A in all sessions. Pt told SLP he had regularly been completing HEP. SLP assumes pt was noncompliant with HEP frequency as he req'd max A for all sessions.  During his last session he was  safe with dys I-II items and thin liquids.  Pt indicated he was satisfied with his current level and told SLP he was fine with d/c from [place] on 01-17-18.   Remaining deficits: Dysphagia   Education / Equipment: HEP procedure and frequency, late effects head/neck radiation on swallowing, overt s/s aspiration PNA.   Plan: Patient agrees to discharge.  Patient goals were partially met. Patient is being discharged due to being pleased with the current functional level.  ????? Pt is safe with at least dys I-II diet and thin liquids at this time.       Problem List Patient Active Problem List   Diagnosis Date Noted  . Fever 10/22/2017  . Leukopenia 10/22/2017  . Thrush 10/22/2017  . Severe protein-calorie malnutrition ([place]) 10/08/2017  . Hyponatremia 10/07/2017  . Anemia 10/07/2017  . Neutropenic fever ([place]) 10/06/2017  . Pancytopenia, acquired ([place]) 10/01/2017  . Weight loss 10/01/2017  . Alcohol withdrawal syndrome without complication ([place]) 48/59/2763  . Tobacco abuse 08/12/2017  . 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

## 2018-01-17 NOTE — Patient Instructions (Signed)
Do your exercises EVERY DAY, AT LEAST once a day. Your muscles under your chin appear hard - do your exercises so they do not become more hard. You told me you were OK with completing the exercises on your own at this time, and were fine with this as our last day.

## 2018-01-18 ENCOUNTER — Telehealth: Payer: Self-pay | Admitting: *Deleted

## 2018-01-18 NOTE — Telephone Encounter (Signed)
Oncology Nurse Navigator Documentation  Rec'd call from patient.  He reported pain at [facility] site, denied tenderness-to-touch, erythema, requested assessment.  He stated PAC due for flush.  Dr. Lebron Conners and RN Lanelle Bal notified.  Gayleen Orem, RN, BSN Head & Neck Oncology Nurse [place] at [place] [phone]

## 2018-01-19 ENCOUNTER — Telehealth: Payer: Self-pay | Admitting: Hematology and Oncology

## 2018-01-19 NOTE — Telephone Encounter (Signed)
Left message for patient confirming 4/4 appointment.

## 2018-01-26 ENCOUNTER — Telehealth: Payer: Self-pay | Admitting: *Deleted

## 2018-01-26 NOTE — Telephone Encounter (Signed)
Called patient to inform of Pet Scan on 01-31-18 - arrival time - 7am , pt to be npo- 6 hrs. prior to test, test to be @ [facility], patient to get results on 02-11-18 @ 3:10 pm with Dr. Isidore Moos,  spoke with patient and he is aware of these appts.

## 2018-01-28 ENCOUNTER — Ambulatory Visit: Payer: Medicaid Other | Admitting: Radiation Oncology

## 2018-01-28 ENCOUNTER — Other Ambulatory Visit: Payer: Self-pay

## 2018-01-28 ENCOUNTER — Ambulatory Visit: Payer: Self-pay | Admitting: Hematology and Oncology

## 2018-01-31 ENCOUNTER — Ambulatory Visit (HOSPITAL_COMMUNITY)
Admission: RE | Admit: 2018-01-31 | Discharge: 2018-01-31 | Disposition: A | Discharge status: Home / Self Care | Payer: Medicaid Other | Source: Ambulatory Visit | Attending: Radiation Oncology | Admitting: Radiation Oncology

## 2018-01-31 ENCOUNTER — Telehealth: Payer: Self-pay | Admitting: Hematology and Oncology

## 2018-01-31 DIAGNOSIS — I2510 Atherosclerotic heart disease of native coronary artery without angina pectoris: Secondary | ICD-10-CM | POA: Insufficient documentation

## 2018-01-31 DIAGNOSIS — I700 Atherosclerosis of aorta: Secondary | ICD-10-CM | POA: Insufficient documentation

## 2018-01-31 DIAGNOSIS — C090 Malignant neoplasm of tonsillar fossa: Secondary | ICD-10-CM | POA: Diagnosis present

## 2018-01-31 LAB — GLUCOSE, CAPILLARY: Glucose-Capillary: 133 mg/dL — ABNORMAL HIGH (ref 65–99)

## 2018-01-31 MED ORDER — FLUDEOXYGLUCOSE F - 18 (FDG) INJECTION: 6.3000 | Freq: Once | INTRAVENOUS | Status: DC | PRN

## 2018-01-31 NOTE — Telephone Encounter (Signed)
Appointment scheduled per 4/12 sch msg, patient notified

## 2018-02-02 ENCOUNTER — Telehealth: Payer: Self-pay | Admitting: *Deleted

## 2018-02-02 NOTE — Telephone Encounter (Signed)
Oncology Nurse Navigator Documentation  Rec'd call from pt stating he has not had call re results of 3/15 PET, "I'm kind of worried".  He noted further he does not have appt with Drs. Isidore Moos and [place] until next Friday.  I shared results of scan, he expressed appreciation, relief.  He indicated he was would be attending [facility] starting 4/23.  Gayleen Orem, RN, BSN Head & Neck Oncology Nurse [place] at [place] [phone]

## 2018-02-03 NOTE — Progress Notes (Signed)
error 

## 2018-02-10 NOTE — Telephone Encounter (Signed)
Error opening  

## 2018-02-11 ENCOUNTER — Ambulatory Visit
Admission: RE | Admit: 2018-02-11 | Discharge: 2018-02-11 | Disposition: A | Discharge status: Home / Self Care | Payer: Medicaid Other | Source: Ambulatory Visit | Attending: Radiation Oncology | Admitting: Radiation Oncology

## 2018-02-11 ENCOUNTER — Inpatient Hospital Stay: Payer: Medicaid Other | Attending: Hematology and Oncology | Admitting: Hematology and Oncology

## 2018-02-11 NOTE — Telephone Encounter (Signed)
Error opening  

## 2018-02-18 ENCOUNTER — Telehealth: Payer: Self-pay | Admitting: *Deleted

## 2018-02-18 NOTE — Telephone Encounter (Signed)
Patient no show PV today, called pt. No answer, left message for him to call us back today before 5 pm.

## 2018-02-21 ENCOUNTER — Telehealth: Payer: Self-pay | Admitting: *Deleted

## 2018-02-21 ENCOUNTER — Other Ambulatory Visit: Payer: Self-pay

## 2018-02-21 ENCOUNTER — Ambulatory Visit (AMBULATORY_SURGERY_CENTER): Payer: Self-pay

## 2018-02-21 VITALS — Ht 67.0 in | Wt 129.4 lb

## 2018-02-21 DIAGNOSIS — Z1211 Encounter for screening for malignant neoplasm of colon: Secondary | ICD-10-CM

## 2018-02-21 MED ORDER — NA SULFATE-K SULFATE-MG SULF 17.5-3.13-1.6 GM/177ML PO SOLN: 1.0000 | Freq: Once | ORAL | 0 refills | Status: AC

## 2018-02-21 NOTE — Progress Notes (Signed)
Denies allergies to eggs or soy products. Denies complication of anesthesia or sedation. Denies use of weight loss medication. Denies use of O2.   Emmi instructions declined.  

## 2018-02-21 NOTE — Telephone Encounter (Signed)
Oncology Nurse Navigator Documentation  Pt called with request for PEG and PAC removal.  He acknowledged he missed 4/26 appt with Dr. Lebron Conners during which removals could have been discussed.  I indicated I would place request for another appt with Dr. Lebron Conners, emphasized importance of keeping this appt.  He voiced understanding.  Gayleen Orem, RN, BSN Head & Neck Oncology Nurse [place] at [place] [phone]

## 2018-02-22 NOTE — Telephone Encounter (Signed)
Opened in error

## 2018-02-23 NOTE — Progress Notes (Signed)
  Mr. Reier presents for follow up of radiation completed 10/25/17 to his Left tonsil and bilateral neck.  Pain issues, if any:  Using a feeding tube?: Yes, he has not used since several days ago.  Weight changes, if any:  Wt Readings from Last 3 Encounters:  02/25/18 129 lb (58.5 kg)  02/21/18 129 lb 6.4 oz (58.7 kg)  12/16/17 128 lb 14.4 oz (58.5 kg)   Swallowing issues, if any: He is swallowing softer foods. He has difficulty swallowing steak, and fried chicken. He does need to drink with meals.  Smoking or chewing tobacco? No Using fluoride trays daily? N/A Last ENT visit was on: Dr. Janace Hoard not since diagnosis (07/05/18) Other notable issues, if any:  PET 01/31/18 Dr. Lebron Conners next appointment 03/07/18  He would like to discuss removing his PEG and PAC.   BP 131/78 (BP Location: Right Arm, Patient Position: Sitting, Cuff Size: Normal)   Pulse 69   Temp 98.1 F (36.7 C) (Oral)   Resp 18   Ht 5\' 7"  (1.702 m)   Wt 129 lb (58.5 kg)   SpO2 100%   BMI 20.20 kg/m

## 2018-02-25 ENCOUNTER — Telehealth: Payer: Self-pay | Admitting: Hematology and Oncology

## 2018-02-25 ENCOUNTER — Other Ambulatory Visit: Payer: Self-pay

## 2018-02-25 ENCOUNTER — Encounter: Payer: Self-pay | Admitting: Radiation Oncology

## 2018-02-25 ENCOUNTER — Ambulatory Visit
Admission: RE | Admit: 2018-02-25 | Discharge: 2018-02-25 | Disposition: A | Discharge status: Home / Self Care | Payer: Medicaid Other | Source: Ambulatory Visit | Attending: Radiation Oncology | Admitting: Radiation Oncology

## 2018-02-25 VITALS — BP 131/78 | HR 69 | Temp 98.1°F | Resp 18 | Ht 67.0 in | Wt 129.0 lb

## 2018-02-25 DIAGNOSIS — C099 Malignant neoplasm of tonsil, unspecified: Secondary | ICD-10-CM | POA: Diagnosis present

## 2018-02-25 DIAGNOSIS — I700 Atherosclerosis of aorta: Secondary | ICD-10-CM | POA: Diagnosis not present

## 2018-02-25 DIAGNOSIS — R1310 Dysphagia, unspecified: Secondary | ICD-10-CM | POA: Diagnosis not present

## 2018-02-25 DIAGNOSIS — Z87891 Personal history of nicotine dependence: Secondary | ICD-10-CM | POA: Diagnosis not present

## 2018-02-25 DIAGNOSIS — I2510 Atherosclerotic heart disease of native coronary artery without angina pectoris: Secondary | ICD-10-CM | POA: Insufficient documentation

## 2018-02-25 DIAGNOSIS — Z79899 Other long term (current) drug therapy: Secondary | ICD-10-CM | POA: Diagnosis not present

## 2018-02-25 NOTE — Progress Notes (Addendum)
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 Squamous cell carcinoma of left tonsil (HCC) C09.9    57 y.o. gentleman with T3N0M0 Squamous cell carcinomaofleft tonsil/palate, p16 negative.  Left tonsil and bilateral neck directed with 70 Gy in 35 fractions on 09/01/17 to 10/25/17.  CHIEF COMPLAINT:  Here for follow-up and surveillance of left tonsil and bilateral neck cancer.  Narrative:  The patient returns today for routine follow-up. He completed radiation on 10/26/27 to his left tonsil and bilateral neck. Patient has a feeding tube, he has not been using it for several days now. He is swallowing softer foods, he has difficulty swallowing steak, and fried chicken. He does need to drink with meals. Patient expressed wanting PEG tube and PAC removed.  PET scan on 01/31/18 revealed no evidence of recurrent of diease. I personally reviewed the imaging, he is seeing me a month after a PET scan due to not showing of previous appointment. However he did receive the results by phone.              Patient is not smoking or drinking.   ALLERGIES:  has No Known Allergies.  Meds: Current Outpatient Medications  Medication Sig Dispense Refill  . acetaminophen (TYLENOL) 325 MG tablet Take 650 mg by mouth every 6 (six) hours as needed for moderate pain.     Marland Kitchen lidocaine-prilocaine (EMLA) cream Apply to affected area once 30 g 3  . phenol (CHLORASEPTIC) 1.4 % LIQD Use as directed 1 spray in the mouth or throat as needed for throat irritation / pain. 1 Bottle 0  . Wound Dressings (SONAFINE) Apply 1 application topically 2 (two) times daily.    . Nutritional Supplements (FEEDING SUPPLEMENT, OSMOLITE 1.5 CAL,) LIQD Give 1 bottle Osmolite 1.5 via PEG 5  times daily with 60 mL free water before and after bolus. Drink or flush tube with additional 720 mL free water 3 times daily. (Patient not taking: Reported on 02/25/2018) 5 Bottle 0   No current facility-administered medications for this encounter.     Physical Findings: The patient is in no acute distress. Patient is alert and oriented. Wt Readings from Last 3 Encounters:  02/25/18 129 lb (58.5 kg)  02/21/18 129 lb 6.4 oz (58.7 kg)  12/16/17 128 lb 14.4 oz (58.5 kg)    height is 5\' 7"  (1.702 m) and weight is 129 lb (58.5 kg). His oral temperature is 98.1 F (36.7 C). His blood pressure is 131/78 and his pulse is 69. His respiration is 18 and oxygen saturation is 100%. .  General: Alert and oriented, in no acute distress HEENT: Head is normocephalic. Extraocular movements are intact. Patient declined removing his dentures for exam despite my recommendation. Mouth is slightly dry, mucous membranes are without any lesions. There is more fullness in the right soft pallet, I suspect this is due to tissue deficit where the left tumor  regressed, this is nor a clinical concern. Neck: Neck is notable for no palpable mass. Skin: Skin in treatment fields shows satisfactory healing. Extremities: No cyanosis or edema. Lymphatics: see Neck Exam Psychiatric: Judgment and insight are intact. Affect is appropriate.   Lab Findings: Lab Results  Component Value Date   WBC 2.8 (  L) 10/25/2017   HGB 9.5 (L) 10/25/2017   HCT 28.6 (L) 10/25/2017   MCV 85.4 10/25/2017   PLT 182 10/25/2017     Radiographic Findings: Nm Pet Image Restag (ps) Skull Base To Thigh  Result Date: 01/31/2018 CLINICAL DATA:  Subsequent treatment strategy for tonsillar cancer. EXAM: NUCLEAR MEDICINE PET SKULL BASE TO THIGH TECHNIQUE: 6.3 mCi F-18 FDG was injected intravenously. Full-ring PET imaging was performed from the skull base to thigh after the radiotracer. CT data was obtained and used for attenuation correction and  anatomic localization. Fasting blood glucose: 133 mg/dl COMPARISON:  08/16/2017 and CT neck 07/27/2017. FINDINGS: Mediastinal blood pool activity: SUV max 2.5 NECK: Very mild asymmetric metabolism in the right sternocleidomastoid, at the level of the hyoid bone. No hypermetabolic lymph nodes. Incidental CT findings: None. CHEST: No hypermetabolic mediastinal, hilar or axillary lymph nodes. No hypermetabolic pulmonary nodules. Incidental CT findings: Right IJ Port-A-Cath terminates in the high right atrium. Coronary artery calcification. No pericardial or pleural effusion. Centrilobular and paraseptal emphysema. ABDOMEN/PELVIS: No abnormal hypermetabolism in the liver, adrenal glands, spleen or pancreas. No hypermetabolic lymph nodes. Incidental CT findings: Percutaneous gastrostomy. Atherosclerotic calcification of the arterial vasculature. No free fluid. SKELETON: No abnormal osseous hypermetabolism. Incidental CT findings: Degenerative changes in the spine. IMPRESSION: 1. No evidence recurrent or metastatic disease. 2. Aortic atherosclerosis (ICD10-170.0). Coronary artery calcification. Electronically Signed   By: Lorin Picket M.D.   On: 01/31/2018 09:20    Impression/Plan:    1) Head and Neck Cancer Status: Overall patient appears to be doing really well. NED  2) Nutritional Status: doing well  PEG tube: Yes, has not been using it for a while now and would like to have it removed. He will talk to med onc later this month about PEG and PAC removal. Until then he was advised to wean off tube completely with PO intake.  3) Risk Factors: The patient has been educated about risk factors including alcohol and tobacco abuse; they understand that avoidance of alcohol and tobacco is important to prevent recurrences as well as other cancers. Abstaining.  4) Swallowing: He is doing better overall, has trouble with foods like steak and fried chicken, an he needs to drink fluids to make swallowing  easier.  5) Dental: new dentures  6) Thyroid function: TSH was not drawn today - unclear why not.  Will see if this can be done at med onc visit later this month.  Needs to continue this annually.  7) Other: Patient has an appointment with Dr. Lebron Conners on 03/07/18, here he will discuss PEG tube removal, and PAC removal so that they can be removed at the same time.  8) Follow-up in 1 year. The patient was encouraged to call with any issues or questions before then. Will order CT chest wo contrast, low dose lung cancer screening study. Pt has 30 pack year history, quit smoking within last year.  I counseled him on lung cancer screening today and after shared decision making, pt is enthusiastic to undergo this study in a year.  I spent 15 minutes face to face with the patient and more than 50% of that time was spent in counseling and/or coordination of care.  _____________________________________   Eppie Gibson, MD  This document serves as a record of services personally performed by Eppie Gibson MD. It was created on her behalf by Delton Coombes, a trained medical scribe. The creation of this record is based on the scribe's personal observations and the provider's  statements to them.

## 2018-02-25 NOTE — Telephone Encounter (Signed)
Appt scheduled letter/calendar mailed to patient per 5/6 sch msg

## 2018-02-28 ENCOUNTER — Other Ambulatory Visit: Payer: Self-pay | Admitting: Radiation Oncology

## 2018-02-28 ENCOUNTER — Telehealth: Payer: Self-pay | Admitting: *Deleted

## 2018-02-28 ENCOUNTER — Encounter: Payer: Self-pay | Admitting: Radiation Oncology

## 2018-02-28 DIAGNOSIS — F17201 Nicotine dependence, unspecified, in remission: Secondary | ICD-10-CM

## 2018-02-28 NOTE — Telephone Encounter (Signed)
CALLED PATIENT TO INFORM OF LAB APPT. ON 03-07-18 @ 3 PM, PATIENT AGREED TO THIS TIME AND DATE

## 2018-03-04 ENCOUNTER — Encounter: Payer: Self-pay | Admitting: Gastroenterology

## 2018-03-04 ENCOUNTER — Ambulatory Visit (AMBULATORY_SURGERY_CENTER): Payer: Medicaid Other | Admitting: Gastroenterology

## 2018-03-04 ENCOUNTER — Other Ambulatory Visit: Payer: Self-pay

## 2018-03-04 VITALS — BP 134/78 | HR 56 | Temp 99.1°F | Resp 18 | Ht 67.0 in | Wt 129.0 lb

## 2018-03-04 DIAGNOSIS — D125 Benign neoplasm of sigmoid colon: Secondary | ICD-10-CM | POA: Diagnosis not present

## 2018-03-04 DIAGNOSIS — Z1211 Encounter for screening for malignant neoplasm of colon: Secondary | ICD-10-CM | POA: Diagnosis not present

## 2018-03-04 DIAGNOSIS — D122 Benign neoplasm of ascending colon: Secondary | ICD-10-CM

## 2018-03-04 DIAGNOSIS — D124 Benign neoplasm of descending colon: Secondary | ICD-10-CM

## 2018-03-04 DIAGNOSIS — D123 Benign neoplasm of transverse colon: Secondary | ICD-10-CM | POA: Diagnosis not present

## 2018-03-04 MED ORDER — SODIUM CHLORIDE 0.9 % IV SOLN: 500.0000 mL | Freq: Once | INTRAVENOUS | Status: DC

## 2018-03-04 NOTE — Progress Notes (Signed)
I have reviewed the patient's medical history in detail and updated the computerized patient record.

## 2018-03-04 NOTE — Op Note (Signed)
[place] Patient Name: [name] Procedure Date: 03/04/2018 11:28 AM MRN: [id number] Endoscopist: Remo Lipps P. Marycruz Boehner MD, MD Age: 57 Referring MD:  Date of Birth: 03-17-1961 Gender: Male Account #: [id number] Procedure:                Colonoscopy Indications:              Screening for colorectal malignant neoplasm, This                            is the patient's first colonoscopy Medicines:                Monitored Anesthesia Care Procedure:                Pre-Anesthesia Assessment:                           - Prior to the procedure, a History and Physical                            was performed, and patient medications and                            allergies were reviewed. The patient's tolerance of                            previous anesthesia was also reviewed. The risks                            and benefits of the procedure and the sedation                            options and risks were discussed with the patient.                            All questions were answered, and informed consent                            was obtained. Prior Anticoagulants: The patient has                            taken no previous anticoagulant or antiplatelet                            agents. ASA Grade Assessment: III - A patient with                            severe systemic disease. After reviewing the risks                            and benefits, the patient was deemed in                            satisfactory condition to undergo the procedure.  After obtaining informed consent, the colonoscope                            was passed under direct vision. Throughout the                            procedure, the patient's blood pressure, pulse, and                            oxygen saturations were monitored continuously. The                            Model PCF-H190DL [id number]) scope was introduced                            through the  anus and advanced to the the cecum,                            identified by appendiceal orifice and ileocecal                            valve. The colonoscopy was performed without                            difficulty. The patient tolerated the procedure                            well. The quality of the bowel preparation was                            good. The ileocecal valve, appendiceal orifice, and                            rectum were photographed. Scope In: 11:40:10 AM Scope Out: 12:02:54 PM Scope Withdrawal Time: 0 hours 16 minutes 43 seconds  Total Procedure Duration: 0 hours 22 minutes 44 seconds  Findings:                 The perianal and digital rectal examinations were                            normal.                           A 4 mm polyp was found in the ascending colon. The                            polyp was sessile. The polyp was removed with a                            cold snare. Resection and retrieval were complete.                           Two sessile polyps were found in the transverse  colon. The polyps were 3 to 4 mm in size. These                            polyps were removed with a cold snare. Resection                            and retrieval were complete.                           A diminutive polyp was found in the transverse                            colon. The polyp was sessile. The polyp was removed                            with a cold biopsy forceps. Resection and retrieval                            were complete.                           A 3 mm polyp was found in the descending colon. The                            polyp was sessile. The polyp was removed with a                            cold snare. Resection and retrieval were complete.                           A 3 mm polyp was found in the sigmoid colon. The                            polyp was sessile. The polyp was removed with a                             cold snare. Resection and retrieval were complete.                           Internal hemorrhoids were found during                            retroflexion. The hemorrhoids were moderate.                           The exam was otherwise without abnormality. Complications:            No immediate complications. Estimated blood loss:                            Minimal. Estimated Blood Loss:     Estimated blood loss was minimal. Impression:               - One 4 mm polyp in  the ascending colon, removed                            with a cold snare. Resected and retrieved.                           - Two 3 to 4 mm polyps in the transverse colon,                            removed with a cold snare. Resected and retrieved.                           - One diminutive polyp in the transverse colon,                            removed with a cold biopsy forceps. Resected and                            retrieved.                           - One 3 mm polyp in the descending colon, removed                            with a cold snare. Resected and retrieved.                           - One 3 mm polyp in the sigmoid colon, removed with                            a cold snare. Resected and retrieved.                           - Internal hemorrhoids.                           - The examination was otherwise normal. Recommendation:           - Patient has a contact number available for                            emergencies. The signs and symptoms of potential                            delayed complications were discussed with the                            patient. Return to normal activities tomorrow.                            Written discharge instructions were provided to the                            patient.                           -  Resume previous diet.                           - Continue present medications.                           - Await pathology results.                           -  Repeat colonoscopy for surveillance based on                            pathology results. Remo Lipps P. Taronda Comacho MD, MD 03/04/2018 95:74:73 PM This report has been signed electronically.

## 2018-03-04 NOTE — Patient Instructions (Signed)
Colon polyps removed today. Result letter in your mail in 2-3 weeks. Handouts given on polyps and hemorrhoids. Resume current medications. Call us with any questions or concerns. Thank you!   YOU HAD AN ENDOSCOPIC PROCEDURE TODAY AT [facility]:   Refer to the procedure report that was given to you for any specific questions about what was found during the examination.  If the procedure report does not answer your questions, please call your gastroenterologist to clarify.  If you requested that your care partner not be given the details of your procedure findings, then the procedure report has been included in a sealed envelope for you to review at your convenience later.  YOU SHOULD EXPECT: Some feelings of bloating in the abdomen. Passage of more gas than usual.  Walking can help get rid of the air that was put into your GI tract during the procedure and reduce the bloating. If you had a lower endoscopy (such as a colonoscopy or flexible sigmoidoscopy) you may notice spotting of blood in your stool or on the toilet paper. If you underwent a bowel prep for your procedure, you may not have a normal bowel movement for a few days.  Please Note:  You might notice some irritation and congestion in your nose or some drainage.  This is from the oxygen used during your procedure.  There is no need for concern and it should clear up in a day or so.  SYMPTOMS TO REPORT IMMEDIATELY:   Following lower endoscopy (colonoscopy or flexible sigmoidoscopy):  Excessive amounts of blood in the stool  Significant tenderness or worsening of abdominal pains  Swelling of the abdomen that is new, acute  Fever of 100F or higher  For urgent or emergent issues, a gastroenterologist can be reached at any hour by calling [phone].   DIET:  We do recommend a small meal at first, but then you may proceed to your regular diet.  Drink plenty of fluids but you should avoid alcoholic beverages for 24  hours.  ACTIVITY:  You should plan to take it easy for the rest of today and you should NOT DRIVE or use heavy machinery until tomorrow (because of the sedation medicines used during the test).    FOLLOW UP: Our staff will call the number listed on your records the next business day following your procedure to check on you and address any questions or concerns that you may have regarding the information given to you following your procedure. If we do not reach you, we will leave a message.  However, if you are feeling well and you are not experiencing any problems, there is no need to return our call.  We will assume that you have returned to your regular daily activities without incident.  If any biopsies were taken you will be contacted by phone or by letter within the next 1-3 weeks.  Please call us at [phone] if you have not heard about the biopsies in 3 weeks.    SIGNATURES/CONFIDENTIALITY: You and/or your care partner have signed paperwork which will be entered into your electronic medical record.  These signatures attest to the fact that that the information above on your After Visit Summary has been reviewed and is understood.  Full responsibility of the confidentiality of this discharge information lies with you and/or your care-partner.

## 2018-03-04 NOTE — Progress Notes (Signed)
Called to room to assist during endoscopic procedure.  Patient ID and intended procedure confirmed with present staff. Received instructions for my participation in the procedure from the performing physician.  

## 2018-03-04 NOTE — Progress Notes (Signed)
Report to PACU, RN, vss, BBS= Clear.  

## 2018-03-07 ENCOUNTER — Telehealth: Payer: Self-pay

## 2018-03-07 ENCOUNTER — Ambulatory Visit
Admission: RE | Admit: 2018-03-07 | Discharge: 2018-03-07 | Disposition: A | Discharge status: Home / Self Care | Payer: Medicaid Other | Source: Ambulatory Visit | Attending: Radiation Oncology | Admitting: Radiation Oncology

## 2018-03-07 ENCOUNTER — Encounter: Payer: Self-pay | Admitting: Hematology and Oncology

## 2018-03-07 ENCOUNTER — Inpatient Hospital Stay: Payer: Medicaid Other | Attending: Hematology and Oncology | Admitting: Hematology and Oncology

## 2018-03-07 VITALS — BP 109/75 | HR 64 | Temp 98.1°F | Resp 18 | Ht 67.0 in | Wt 126.9 lb

## 2018-03-07 DIAGNOSIS — Z923 Personal history of irradiation: Secondary | ICD-10-CM | POA: Insufficient documentation

## 2018-03-07 DIAGNOSIS — C099 Malignant neoplasm of tonsil, unspecified: Secondary | ICD-10-CM | POA: Diagnosis present

## 2018-03-07 DIAGNOSIS — M1990 Unspecified osteoarthritis, unspecified site: Secondary | ICD-10-CM

## 2018-03-07 DIAGNOSIS — Z9221 Personal history of antineoplastic chemotherapy: Secondary | ICD-10-CM

## 2018-03-07 DIAGNOSIS — Z79899 Other long term (current) drug therapy: Secondary | ICD-10-CM | POA: Insufficient documentation

## 2018-03-07 DIAGNOSIS — C090 Malignant neoplasm of tonsillar fossa: Secondary | ICD-10-CM

## 2018-03-07 DIAGNOSIS — F17210 Nicotine dependence, cigarettes, uncomplicated: Secondary | ICD-10-CM | POA: Diagnosis not present

## 2018-03-07 DIAGNOSIS — Z1329 Encounter for screening for other suspected endocrine disorder: Secondary | ICD-10-CM

## 2018-03-07 LAB — TSH: TSH: 3.749 u[IU]/mL (ref 0.320–4.118)

## 2018-03-07 NOTE — Progress Notes (Signed)
[name] Follow-up Visit:  Assessment: Squamous cell carcinoma of left tonsil [name]) 57 y.o. male with previous diagnosis of squamous cell carcinoma of the left tonsil treated with systemic chemotherapy with carboplatin and paclitaxel administered concurrently with radiotherapy.  The treatment was complicated by recurrent neutropenic infections for which patient has been admitted to the [name] previously.  Eventually, recovery proceeded to pace and restaging imaging with PET/CT obtained mid April 2019 demonstrated complete response to therapy.  Patient is returning to baseline health and function at this time.  He is not using PEG tube for nutrition or hydration.  Plan: -Initiate observation. -Consult ENT for joint follow-up with each of our services seeing patient every 6 months in alternating fashion. -Consult interventional radiology for PEG tube and Infuse-a-Port removal - Return to medical oncology clinic in 6 months.    Voice recognition software was used and creation of this note. Despite my best effort at editing the text, some misspelling/errors may have occurred.  Orders Placed This Encounter  Procedures  . IR Removal Tun Access W/ Port W/O FL    Standing Status:   Future    Standing Expiration Date:   05/08/2019    Order Specific Question:   Reason for exam:    Answer:   therapy completed, please remove the device    Order Specific Question:   Preferred Imaging Location?    Answer:   [facility] TUBE REMOVAL    Standing Status:   Future    Standing Expiration Date:   05/08/2019    Order Specific Question:   Reason for Exam (SYMPTOM  OR DIAGNOSIS REQUIRED)    Answer:   therapy completed, please remove the device    Order Specific Question:   Preferred Imaging Location?    Answer:   [facility]  . CBC with Differential ([place] Only)    Standing Status:   Future    Standing Expiration Date:   03/08/2019  . CMP  ([place] only)    Standing Status:   Future    Standing Expiration Date:   03/08/2019  . Ambulatory referral to ENT    Referral Priority:   Routine    Referral Type:   Consultation    Referral Reason:   Specialty Services Required    Referred to Provider:   Melissa Montane, MD    Requested Specialty:   Otolaryngology    Number of Visits Requested:   1    Cancer Staging Squamous cell carcinoma of left tonsil [facility]) Staging form: Pharynx - P16 Negative Oropharynx, AJCC 8th Edition - Clinical stage from 08/06/2017: Stage III (cT3, cN0, cM0, p16: Negative) - Signed by Eppie Gibson, MD on 11/08/2017   All questions were answered. . The patient knows to call the clinic with any problems, questions or concerns.  This note was electronically signed.    History of Presenting Illness [name] is a 57 y.o. male followed in the [place] for diagnosis of squamous cell carcinoma of the left tonsil. Patient initially presented with sore throat onset around the middle of June. He first presented to the Urgent Care on 04/26/17 after 3 weeks of symptoms. The sore throat was also accompanied by the globus sensation in the neck. He was treated for uvulitis initially, wihtout significant improvement despite two additional visits to the Urgent Care. On his last visit with the [facility]gent CareENT referral was made.  The patient saw Dr. Janace Hoard from  [facility] and underwent laryngoscopy on 07/16/17 with biopsy which confirmed presence of squamous cell carcinoma. Please see results of additional evaluation in the oncological history below. At the present time, additional assessment is pending. Patient was seen by dentistry and multiple dental extractions were recommended. He underwent surgical removal of his teeth on 08/09/17.    Patient returns to the clinic for continued hematological monitoring.  Denies any new symptoms since return to the clinic.  Appears to be able to tolerate oral  nutrition and hydration without difficulties.  Still reports occasional sore throat and dry mouth.  Oncological/hematological History:   Squamous cell carcinoma of left tonsil ([place])   07/16/2017 Initial Diagnosis    Squamous cell carcinoma of left tonsil ([place])      07/16/2017 Pathology Results    Diagnosis: Tonsil, biopsy, left mass -- SQUAMOUS CELL CARCINOMA; The biopsy has at least squamous cell carcinoma in-situ. There are foci suspicious but not definitive for invasion. p16 immunohistochemistry is negative in the neoplastic cells. FINAL DIAGNOSIS       07/27/2017 Imaging    CT neck: Indistinct tonsillar enlargement on the left in the region of left tonsillar mass biopsy. This could be a combination of post biopsy edema and residual mass. Margins are not discrete and accurate measurement cannot be performed. This is estimated at about 3 cm in size. No evidence of metastatic adenopathy.      08/16/2017 Imaging    PET-CT: Hypermetabolic disease in the left palatal fossa.  Indeterminate level 3 lymph node which may be reactive considering recent dental extractions.      09/01/2017 -  Radiation Therapy         09/01/2017 -  Chemotherapy    Carbboplatin AUC2,d1 + Paclitaxel 45mg /m2, d1 Q7d --Week #1, 64/40/34: Complicated by some burning/soreness in the throat. --Week #2, 09/08/17: --Week #3, 09/15/17: --Week #4, 09/22/17: --Week #5, 74/25/95: Complicated by admission for neutropenic fever 12/19-22/18 --Week #6, 10/15/17:      09/13/2017 Procedure    Successful fluoroscopic insertion of a 20-French pull-through gastrostomy tube. Successful placement of a right internal jugular approach power injectable Port-A-Cath.       Medical History: Past Medical History:  Diagnosis Date  . Arthritis   . Cancer ([place])    left tonsil cancer  . History of radiation therapy 09/01/17- 10/25/17   Left tonsil and bilateral neck 70 Gy in 35 fractions to gross disease, 63 gy in 35 fractions to  high risk nodal echelons, and 56 Gy in 35 fraction to intermediate risk nodal echelons.     Surgical History: Past Surgical History:  Procedure Laterality Date  . IR FLUORO GUIDE PORT INSERTION RIGHT  09/13/2017  . IR GASTROSTOMY TUBE MOD SED  09/13/2017  . IR US GUIDE VASC ACCESS RIGHT  09/13/2017  . MULTIPLE EXTRACTIONS WITH ALVEOLOPLASTY N/A 08/09/2017   Procedure: Extraction of tooth #'s 1-6, 11-17,21,22,26-30 and 32 with alveoloplasty and bilateral mandibular tori reductions;  Surgeon: Lenn Cal, DDS;  Location: [facility];  Service: Oral Surgery;  Laterality: N/A;  . tonsil biopsy     left tonsil    Family History: Family History  Problem Relation Age of Onset  . Diabetes Mother   . Colon cancer Neg Hx   . Esophageal cancer Neg Hx   . Liver cancer Neg Hx   . Pancreatic cancer Neg Hx   . Rectal cancer Neg Hx   . Stomach cancer Neg Hx     Social History: Social  History   Socioeconomic History  . Marital status: Single    Spouse name: Not on file  . Number of children: 1  . Years of education: Not on file  . Highest education level: Not on file  Occupational History  . Occupation: Disabled  Social Needs  . Financial resource strain: Not on file  . Food insecurity:    Worry: Not on file    Inability: Not on file  . Transportation needs:    Medical: Not on file    Non-medical: Not on file  Tobacco Use  . Smoking status: Light Tobacco Smoker    Packs/day: 1.00    Years: 30.00    Pack years: 30.00    Types: Cigarettes  . Smokeless tobacco: Never Used  . Tobacco comment: At least one cigarette daily  Substance and Sexual Activity  . Alcohol use: Yes    Alcohol/week: 4.2 oz    Types: 7 Cans of beer per week    Comment: 1 pint daily   . Drug use: No    Types: Cocaine    Comment: he has a past history of cocaine abuse, he denies current use  . Sexual activity: Not on file  Lifestyle  . Physical activity:    Days per week: Not on file    Minutes per  session: Not on file  . Stress: Not on file  Relationships  . Social connections:    Talks on phone: Not on file    Gets together: Not on file    Attends religious service: Not on file    Active member of club or organization: Not on file    Attends meetings of clubs or organizations: Not on file    Relationship status: Not on file  . Intimate partner violence:    Fear of current or ex partner: Not on file    Emotionally abused: Not on file    Physically abused: Not on file    Forced sexual activity: Not on file  Other Topics Concern  . Not on file  Social History Narrative  . Not on file    Allergies: No Known Allergies  Medications:  Current Outpatient Medications  Medication Sig Dispense Refill  . acetaminophen (TYLENOL) 325 MG tablet Take 650 mg by mouth every 6 (six) hours as needed for moderate pain.     . Nutritional Supplements (FEEDING SUPPLEMENT, OSMOLITE 1.5 CAL,) LIQD Give 1 bottle Osmolite 1.5 via PEG 5 times daily with 60 mL free water before and after bolus. Drink or flush tube with additional 720 mL free water 3 times daily. (Patient not taking: Reported on 02/25/2018) 5 Bottle 0  . phenol (CHLORASEPTIC) 1.4 % LIQD Use as directed 1 spray in the mouth or throat as needed for throat irritation / pain. 1 Bottle 0  . Wound Dressings (SONAFINE) Apply 1 application topically 2 (two) times daily.     Current Facility-Administered Medications  Medication Dose Route Frequency Provider Last Rate Last Dose  . 0.9 %  sodium chloride infusion  500 mL Intravenous Once Armbruster, Carlota Raspberry, MD        Review of Systems: Review of Systems  HENT:   Negative for sore throat and trouble swallowing.   All other systems reviewed and are negative.    PHYSICAL EXAMINATION Blood pressure 109/75, pulse 64, temperature 98.1 F (36.7 C), temperature source Oral, resp. rate 18, height 5\' 7"  (1.702 m), weight 126 lb 14.4 oz (57.6 kg), SpO2 100 %.  ECOG  PERFORMANCE STATUS: 1 -  Symptomatic but completely ambulatory  Physical Exam  Constitutional: He is oriented to person, place, and time and well-developed, well-nourished, and in no distress. No distress.  HENT:  Head: Normocephalic and atraumatic.  Mouth/Throat: Uvula is midline, oropharynx is clear and moist and mucous membranes are normal. He does not have dentures. Oral lesions present. Abnormal dentition. Dental caries present. No dental abscesses or uvula swelling. No oropharyngeal exudate or tonsillar abscesses.  Eyes: Pupils are equal, round, and reactive to light. Conjunctivae and EOM are normal. No scleral icterus.  Neck: No thyromegaly present.  Cardiovascular: Normal rate, regular rhythm, normal heart sounds and intact distal pulses.  No murmur heard. Pulmonary/Chest: Effort normal. He has no wheezes. He has no rales.  Abdominal: Soft. Bowel sounds are normal. He exhibits no distension and no mass. There is no tenderness. There is no rebound and no guarding.  Musculoskeletal: Normal range of motion. He exhibits no edema.  Lymphadenopathy:    He has no cervical adenopathy.  Neurological: He is alert and oriented to person, place, and time. He has normal reflexes. No cranial nerve deficit. Coordination normal.  Skin: Skin is warm. No rash noted. He is not diaphoretic. No erythema.     LABORATORY DATA: I have personally reviewed the data as listed: [name] Visit on 03/07/2018  Component Date Value Ref Range Status  . TSH 03/07/2018 3.749  0.320 - 4.118 uIU/mL Final   Performed at [name], [place] [address] [place], [place] [address]       Ardath Sax, MD

## 2018-03-07 NOTE — Telephone Encounter (Signed)
  Follow up Call-  Call back number 03/04/2018  Post procedure Call Back phone  # [phone]  Permission to leave phone message Yes  Some recent data might be hidden     Patient questions:  Do you have a fever, pain , or abdominal swelling? No. Pain Score  0 *  Have you tolerated food without any problems? Yes.    Have you been able to return to your normal activities? Yes.    Do you have any questions about your discharge instructions: Diet   No. Medications  No. Follow up visit  No.  Do you have questions or concerns about your Care? No.  Actions: * If pain score is 4 or above: No action needed, pain <4.

## 2018-03-07 NOTE — Telephone Encounter (Signed)
Attempted to reach patient for post-procedure f/u call. No answer. Unable to leave message. Will make another attempt to reach him again later today.

## 2018-03-07 NOTE — Telephone Encounter (Signed)
Printed avs and calender of upcoming appointment . Per 5/20 los 

## 2018-03-07 NOTE — Assessment & Plan Note (Signed)
57 y.o. male with previous diagnosis of squamous cell carcinoma of the left tonsil treated with systemic chemotherapy with carboplatin and paclitaxel administered concurrently with radiotherapy.  The treatment was complicated by recurrent neutropenic infections for which patient has been admitted to the hospital previously.  Eventually, recovery proceeded to pace and restaging imaging with PET/CT obtained mid April 2019 demonstrated complete response to therapy.  Patient is returning to baseline health and function at this time.  He is not using PEG tube for nutrition or hydration.  Plan: -Initiate observation. -Consult ENT for joint follow-up with each of our services seeing patient every 6 months in alternating fashion. -Consult interventional radiology for PEG tube and Infuse-a-Port removal - Return to medical oncology clinic in 6 months.

## 2018-03-07 NOTE — Telephone Encounter (Signed)
Patient calling to state he is doing fine and has no questions or concerns at this time.

## 2018-03-11 ENCOUNTER — Telehealth (HOSPITAL_COMMUNITY): Payer: Self-pay | Admitting: Radiology

## 2018-03-11 ENCOUNTER — Encounter: Payer: Self-pay | Admitting: Gastroenterology

## 2018-03-11 NOTE — Telephone Encounter (Signed)
Patient called [name] to find out when his port and g-tube removal were scheduled.  I informed patient that his appointment was not scheduled at this time.  Attempts were made to reach him on 03/08/18 and we were waiting on his response.  Patient stated that his chemo dr wants his port and g tube removed next week.  I informed patient that [name] at [facility] does not have any appointments until June 10.  Patient asked that I reach Dr. Lebron Conners and Liliane Channel his navigator for clarification because he needed the appointment next week.  I informed the patient that I would be happy to ask the department at [facility] if they had any appointments.  I asked patient if I could return his call with that information, and he said yes.  Patient stated that I could leave a detailed phone message on his listed number ([phone]).

## 2018-03-26 ENCOUNTER — Other Ambulatory Visit: Payer: Self-pay | Admitting: Radiology

## 2018-03-28 ENCOUNTER — Ambulatory Visit (HOSPITAL_COMMUNITY)
Admission: RE | Admit: 2018-03-28 | Discharge: 2018-03-28 | Disposition: A | Discharge status: Home / Self Care | Payer: Medicaid Other | Source: Ambulatory Visit | Attending: Hematology and Oncology | Admitting: Hematology and Oncology

## 2018-03-28 ENCOUNTER — Encounter (HOSPITAL_COMMUNITY): Payer: Self-pay

## 2018-03-28 DIAGNOSIS — Z9889 Other specified postprocedural states: Secondary | ICD-10-CM | POA: Insufficient documentation

## 2018-03-28 DIAGNOSIS — Z452 Encounter for adjustment and management of vascular access device: Secondary | ICD-10-CM | POA: Diagnosis present

## 2018-03-28 DIAGNOSIS — Z9221 Personal history of antineoplastic chemotherapy: Secondary | ICD-10-CM | POA: Insufficient documentation

## 2018-03-28 DIAGNOSIS — Z431 Encounter for attention to gastrostomy: Secondary | ICD-10-CM | POA: Insufficient documentation

## 2018-03-28 DIAGNOSIS — M1990 Unspecified osteoarthritis, unspecified site: Secondary | ICD-10-CM | POA: Insufficient documentation

## 2018-03-28 DIAGNOSIS — C099 Malignant neoplasm of tonsil, unspecified: Secondary | ICD-10-CM

## 2018-03-28 DIAGNOSIS — Z923 Personal history of irradiation: Secondary | ICD-10-CM | POA: Diagnosis not present

## 2018-03-28 DIAGNOSIS — Z85818 Personal history of malignant neoplasm of other sites of lip, oral cavity, and pharynx: Secondary | ICD-10-CM | POA: Diagnosis not present

## 2018-03-28 HISTORY — PX: IR GASTROSTOMY TUBE REMOVAL: IMG5492

## 2018-03-28 HISTORY — PX: IR REMOVAL TUN ACCESS W/ PORT W/O FL MOD SED: IMG2290

## 2018-03-28 LAB — CBC WITH DIFFERENTIAL/PLATELET
Basophils Absolute: 0 10*3/uL (ref 0.0–0.1)
Basophils Relative: 0 %
EOS ABS: 0.1 10*3/uL (ref 0.0–0.7)
EOS PCT: 3 %
HCT: 39.3 % (ref 39.0–52.0)
Hemoglobin: 12.8 g/dL — ABNORMAL LOW (ref 13.0–17.0)
LYMPHS ABS: 0.7 10*3/uL (ref 0.7–4.0)
LYMPHS PCT: 31 %
MCH: 28.8 pg (ref 26.0–34.0)
MCHC: 32.6 g/dL (ref 30.0–36.0)
MCV: 88.5 fL (ref 78.0–100.0)
MONO ABS: 0.4 10*3/uL (ref 0.1–1.0)
Monocytes Relative: 17 %
Neutro Abs: 1.2 10*3/uL — ABNORMAL LOW (ref 1.7–7.7)
Neutrophils Relative %: 49 %
PLATELETS: 137 10*3/uL — AB (ref 150–400)
RBC: 4.44 MIL/uL (ref 4.22–5.81)
RDW: 14.1 % (ref 11.5–15.5)
WBC: 2.4 10*3/uL — ABNORMAL LOW (ref 4.0–10.5)

## 2018-03-28 LAB — BASIC METABOLIC PANEL
Anion gap: 11 (ref 5–15)
BUN: 9 mg/dL (ref 6–20)
CHLORIDE: 98 mmol/L — AB (ref 101–111)
CO2: 27 mmol/L (ref 22–32)
CREATININE: 0.72 mg/dL (ref 0.61–1.24)
Calcium: 9.5 mg/dL (ref 8.9–10.3)
GFR calc Af Amer: >60 mL/min (ref >60)
GFR calc non Af Amer: >60 mL/min (ref >60)
GLUCOSE: 132 mg/dL — AB (ref 65–99)
POTASSIUM: 3.9 mmol/L (ref 3.5–5.1)
SODIUM: 136 mmol/L (ref 135–145)

## 2018-03-28 LAB — PROTIME-INR
INR: 1.03
Prothrombin Time: 13.4 seconds (ref 11.4–15.2)

## 2018-03-28 MED ORDER — LIDOCAINE-EPINEPHRINE (PF) 1 %-1:200000 IJ SOLN
INTRAMUSCULAR | Status: AC | PRN
  Administered 2018-03-28: 20 mL

## 2018-03-28 MED ORDER — LIDOCAINE-EPINEPHRINE (PF) 2 %-1:200000 IJ SOLN
INTRAMUSCULAR | Status: AC
  Filled 2018-03-28: qty 20

## 2018-03-28 MED ORDER — FENTANYL CITRATE (PF) 100 MCG/2ML IJ SOLN
INTRAMUSCULAR | Status: AC
  Filled 2018-03-28: qty 2

## 2018-03-28 MED ORDER — SODIUM CHLORIDE 0.9 % IV SOLN
INTRAVENOUS | Status: DC
  Administered 2018-03-28: 10:00:00 via INTRAVENOUS

## 2018-03-28 MED ORDER — MIDAZOLAM HCL 2 MG/2ML IJ SOLN
INTRAMUSCULAR | Status: AC
  Filled 2018-03-28: qty 4

## 2018-03-28 MED ORDER — CEFAZOLIN SODIUM-DEXTROSE 2-4 GM/100ML-% IV SOLN
INTRAVENOUS | Status: AC
  Administered 2018-03-28: 2 g via INTRAVENOUS
  Filled 2018-03-28: qty 100

## 2018-03-28 MED ORDER — MIDAZOLAM HCL 2 MG/2ML IJ SOLN
INTRAMUSCULAR | Status: AC | PRN
  Administered 2018-03-28 (×2): 1 mg via INTRAVENOUS
  Administered 2018-03-28: 2 mg via INTRAVENOUS

## 2018-03-28 MED ORDER — LIDOCAINE VISCOUS HCL 2 % MT SOLN
OROMUCOSAL | Status: AC | PRN
  Administered 2018-03-28: 15 mL via OROMUCOSAL

## 2018-03-28 MED ORDER — LIDOCAINE VISCOUS HCL 2 % MT SOLN
OROMUCOSAL | Status: AC
  Filled 2018-03-28: qty 15

## 2018-03-28 MED ORDER — CEFAZOLIN SODIUM-DEXTROSE 2-4 GM/100ML-% IV SOLN
2.0000 g | INTRAVENOUS | Status: AC
  Administered 2018-03-28: 2 g via INTRAVENOUS

## 2018-03-28 MED ORDER — FENTANYL CITRATE (PF) 100 MCG/2ML IJ SOLN
INTRAMUSCULAR | Status: AC | PRN
  Administered 2018-03-28 (×2): 50 ug via INTRAVENOUS

## 2018-03-28 MED ORDER — HYDROCODONE-ACETAMINOPHEN 5-325 MG PO TABS: 1.0000 | ORAL_TABLET | ORAL | Status: DC | PRN

## 2018-03-28 NOTE — Discharge Instructions (Signed)
Moderate Conscious Sedation, Adult, Care After These instructions provide you with information about caring for yourself after your procedure. Your health care provider may also give you more specific instructions. Your treatment has been planned according to current medical practices, but problems sometimes occur. Call your health care provider if you have any problems or questions after your procedure. What can I expect after the procedure? After your procedure, it is common:  To feel sleepy for several hours.  To feel clumsy and have poor balance for several hours.  To have poor judgment for several hours.  To vomit if you eat too soon.  Follow these instructions at home: For at least 24 hours after the procedure:   Do not: ? Participate in activities where you could fall or become injured. ? Drive. ? Use heavy machinery. ? Drink alcohol. ? Take sleeping pills or medicines that cause drowsiness. ? Make important decisions or sign legal documents. ? Take care of children on your own.  Rest. Eating and drinking  Follow the diet recommended by your health care provider.  If you vomit: ? Drink water, juice, or soup when you can drink without vomiting. ? Make sure you have little or no nausea before eating solid foods. General instructions  Have a responsible adult stay with you until you are awake and alert.  Take over-the-counter and prescription medicines only as told by your health care provider.  If you smoke, do not smoke without supervision.  Keep all follow-up visits as told by your health care provider. This is important. Contact a health care provider if:  You keep feeling nauseous or you keep vomiting.  You feel light-headed.  You develop a rash.  You have a fever. Get help right away if:  You have trouble breathing. This information is not intended to replace advice given to you by your health care provider. Make sure you discuss any questions you have  with your health care provider. Document Released: 07/26/2013 Document Revised: 03/09/2016 Document Reviewed: 01/25/2016 Elsevier Interactive Patient Education  2018 [place], Adult An incision is a cut that a doctor makes in your skin for surgery (for a procedure). Most times, these cuts are closed after surgery. Your cut from surgery may be closed with stitches (sutures), staples, skin glue, or skin tape (adhesive strips). You may need to return to your doctor to have stitches or staples taken out. This may happen many days or many weeks after your surgery. The cut needs to be well cared for so it does not get infected. How to care for your cut Cut care  Follow instructions from your doctor about how to take care of your cut. Make sure you: ? Wash your hands with soap and water before you change your bandage (dressing). If you cannot use soap and water, use hand sanitizer. ? Change your bandage as told by your doctor.  You may remove your dressing tomorrow. ? Leave skin glue in place. They may need to stay in place for 2 weeks or longer. If tape strips get loose and curl up, you may trim the loose edges. Do not remove tape strips completely unless your doctor says it is okay.  Check your cut area every day for signs of infection. Check for: ? More redness, swelling, or pain. ? More fluid or blood. ? Warmth. ? Pus or a bad smell.  Ask your doctor how to clean the cut. This may include: ? Using mild soap and  water. ? Using a clean towel to pat the cut dry after you clean it. ? Putting a cream or ointment on the cut. Do this only as told by your doctor. ? Covering the cut with a clean bandage.  Ask your doctor when you can leave the cut uncovered.  Do not take baths, swim, or use a hot tub until your doctor says it is okay. Ask your doctor if you can take showers. You may only be allowed to take sponge baths for bathing.          You may shower  tomorrow. Medicines  If you were prescribed an antibiotic medicine, cream, or ointment, take the antibiotic or put it on the cut as told by your doctor. Do not stop taking or putting on the antibiotic even if your condition gets better.  Take over-the-counter and prescription medicines only as told by your doctor. General instructions  Limit movement around your cut. This helps healing. ? Avoid straining, lifting, or exercise for the first month, or for as long as told by your doctor. ? Follow instructions from your doctor about going back to your normal activities. ? Ask your doctor what activities are safe.  Protect your cut from the sun when you are outside for the first 6 months, or for as long as told by your doctor. Put on sunscreen around the scar or cover up the scar.  Keep all follow-up visits as told by your doctor. This is important. Contact a doctor if:  Your have more redness, swelling, or pain around the cut.  You have more fluid or blood coming from the cut.  Your cut feels warm to the touch.  You have pus or a bad smell coming from the cut.  You have a fever or shaking chills.  You feel sick to your stomach (nauseous) or you throw up (vomit).  You are dizzy.  Your stitches or staples come undone. Get help right away if:  You have a red streak coming from your cut.  Your cut bleeds through the bandage and the bleeding does not stop with gentle pressure.  The edges of your cut open up and separate.  You have very bad (severe) pain.  You have a rash.  You are confused.  You pass out (faint).  You have trouble breathing and you have a fast heartbeat. This information is not intended to replace advice given to you by your health care provider. Make sure you discuss any questions you have with your health care provider. Document Released: 12/28/2011 Document Revised: 06/12/2016 Document Reviewed: 06/12/2016 Elsevier Interactive Patient Education  2017  [place] Removal, Care After Refer to this sheet in the next few weeks. These instructions provide you with information about caring for yourself after your procedure. Your health care provider may also give you more specific instructions. Your treatment has been planned according to current medical practices, but problems sometimes occur. Call your health care provider if you have any problems or questions after your procedure. What can I expect after the procedure? After the procedure, it is common to have:  Soreness or pain near your incision.  Some swelling or bruising near your incision.  Follow these instructions at home: Medicines  Take over-the-counter and prescription medicines only as told by your health care provider.  If you were prescribed an antibiotic medicine, take it as told by your health care provider. Do not stop taking the antibiotic even if you start  to feel better. Bathing  Do not take baths, swim, or use a hot tub until your health care provider approves. Ask your health care provider if you can take showers. You may only be allowed to take sponge baths for bathing.  You may shower tomorrow. Incision care  Follow instructions from your health care provider about how to take care of your incision. Make sure you: ? Wash your hands with soap and water before you change your bandage (dressing). If soap and water are not available, use hand sanitizer. ? Change your dressing as told by your health care provider.  You may remove your dressing tomorrow. ? Keep your dressing dry. ? Leave stitches (sutures), skin glue, or adhesive strips in place. These skin closures may need to stay in place for 2 weeks or longer. If adhesive strip edges start to loosen and curl up, you may trim the loose edges. Do not remove adhesive strips completely unless your health care provider tells you to do that.  Check your incision area every day for signs of infection.  Check for: ? More redness, swelling, or pain. ? More fluid or blood. ? Warmth. ? Pus or a bad smell. Driving  If you received a sedative, do not drive for 24 hours after the procedure.  If you did not receive a sedative, ask your health care provider when it is safe to drive. Activity  Return to your normal activities as told by your health care provider. Ask your health care provider what activities are safe for you.  Until your health care provider says it is safe: ? Do not lift anything that is heavier than 10 lb (4.5 kg). ? Do not do activities that involve lifting your arms over your head. General instructions  Do not use any tobacco products, such as cigarettes, chewing tobacco, and e-cigarettes. Tobacco can delay healing. If you need help quitting, ask your health care provider.  Keep all follow-up visits as told by your health care provider. This is important. Contact a health care provider if:  You have more redness, swelling, or pain around your incision.  You have more fluid or blood coming from your incision.  Your incision feels warm to the touch.  You have pus or a bad smell coming from your incision.  You have a fever.  You have pain that is not relieved by your pain medicine. Get help right away if:  You have chest pain.  You have difficulty breathing. This information is not intended to replace advice given to you by your health care provider. Make sure you discuss any questions you have with your health care provider. Document Released: 09/16/2015 Document Revised: 03/12/2016 Document Reviewed: 07/10/2015 Elsevier Interactive Patient Education  [employer].

## 2018-03-28 NOTE — H&P (Signed)
Referring Physician(s): Perlov,Mikhail G  Supervising Physician: Markus Daft  Patient Status:  [facility]  Chief Complaint:  "I'm getting my port and stomach tube out"  Subjective: Patient familiar to IR service from prior Port-A-Cath and gastrostomy tube placements on 09/13/2017.  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  He is currently eating and no longer receiving treatment.  He presents today for Port-A-Cath and gastrostomy tube removals.  He currently denies fever, headache, chest pain, dyspnea, cough, abdominal/back pain, nausea, vomiting or bleeding.  He does have some occasional right shoulder discomfort. Past Medical History:  Diagnosis Date  . Arthritis   . Cancer ([place])    left tonsil cancer  . History of radiation therapy 09/01/17- 10/25/17   Left tonsil and bilateral neck 70 Gy in 35 fractions to gross disease, 63 gy in 35 fractions to high risk nodal echelons, and 56 Gy in 35 fraction to intermediate risk nodal echelons.    Past Surgical History:  Procedure Laterality Date  . IR FLUORO GUIDE PORT INSERTION RIGHT  09/13/2017  . IR GASTROSTOMY TUBE MOD SED  09/13/2017  . IR US GUIDE VASC ACCESS RIGHT  09/13/2017  . MULTIPLE EXTRACTIONS WITH ALVEOLOPLASTY N/A 08/09/2017   Procedure: Extraction of tooth #'s 1-6, 11-17,21,22,26-30 and 32 with alveoloplasty and bilateral mandibular tori reductions;  Surgeon: Lenn Cal, DDS;  Location: [facility];  Service: Oral Surgery;  Laterality: N/A;  . tonsil biopsy     left tonsil      Allergies: Patient has no known allergies.  Medications: Prior to Admission medications   Medication Sig Start Date End Date Taking? Authorizing Provider  acetaminophen (TYLENOL) 325 MG tablet Take 650 mg by mouth every 6 (six) hours as needed for moderate pain.     [provider]  phenol (CHLORASEPTIC) 1.4 % LIQD Use as directed 1 spray in the mouth or throat as needed for  throat irritation / pain. 10/25/17   Barton Dubois, MD     Vital Signs: BP 126/90 (BP Location: Right Arm)   Pulse 73   Temp 98 F (36.7 C) (Oral)   Resp 16   SpO2 100%   Physical Exam awake, alert.  Chest clear to auscultation bilaterally.  Clean, intact right chest wall Port-A-Cath.  Heart with regular rate and rhythm.  Abdomen soft, positive bowel sounds, nontender, intact gastrostomy tube.  No lower extremity edema.  Imaging: No results found.  Labs:  CBC: Recent Labs    10/22/17 1621 10/23/17 0429 10/24/17 0700 10/25/17 1002  WBC 2.2* 2.1* 2.9* 2.8*  HGB 9.9* 9.0* 9.0* 9.5*  HCT 29.5* 26.8* 26.8* 28.6*  PLT 213 181 185 182    COAGS: Recent Labs    09/13/17 1140  INR 1.08    BMP: Recent Labs    10/22/17 1621 10/23/17 0429 10/24/17 0700 10/25/17 1002  NA 133* 134* 133* 134*  K 3.9 3.7 4.0 4.1  CL 95* 100* 96* 97*  CO2 29 29 31 30   GLUCOSE 112* 102* 116* 141*  BUN 10 9 9 8   CALCIUM 9.4 8.6* 8.9 9.3  CREATININE 0.49* 0.41* 0.41* 0.41*  GFRNONAA >60 >60 >60 >60  GFRAA >60 >60 >60 >60    LIVER FUNCTION TESTS: Recent Labs    10/07/17 0500 10/13/17 1320 10/22/17 1621 10/23/17 0429  BILITOT 0.8 <0.22 0.7 0.5  AST 16 29 19 17   ALT 13* 26 17 14*  ALKPHOS 59 79  65 54  PROT 6.1* 7.2 6.8 6.1*  ALBUMIN 2.8* 3.3* 3.2* 2.9*    Assessment and Plan: Patient with history of squamous cell carcinoma of the left tonsil and prior Port-A-Cath and gastrostomy tube placements in 2018.  He has completed treatment and is currently eating a regular diet.  He presents today for both Port-A-Cath and gastrostomy tube removals.  Details/risks of procedures, including but not limited to, internal bleeding, infection, injury to adjacent structures discussed with patient with his understanding and consent.  LABS PENDING   Electronically Signed: D. Rowe Robert, PA-C 03/28/2018, 9:44 AM   I spent a total of 20 minutes at the the patient's bedside AND on the patient's  hospital floor or unit, greater than 50% of which was counseling/coordinating care for Port-A-Cath and gastrostomy tube removals

## 2018-03-28 NOTE — Procedures (Signed)
Successful removal of right chest port.  Successful removal of gastrostomy tube.  Minimal blood loss and no immediate complication.

## 2018-04-04 ENCOUNTER — Telehealth: Payer: Self-pay | Admitting: *Deleted

## 2018-04-04 NOTE — Telephone Encounter (Signed)
Oncology Nurse Navigator Documentation  Rec'd call from patient asking when gauze dsg on PAC removal site can be removed.  I advised safe to remove now, allow liquid Band-Aid to come off with showering.  He voiced understanding.  Gayleen Orem, RN, BSN Head & Neck Oncology Nurse [facility] [phone]

## 2018-06-10 IMAGING — US IR US GUIDE VASC ACCESS RIGHT
1 series · 1 of 1 positions shown · non-contrast
Comparison: PET-CT - 08/16/2017

INDICATION: History of head neck cancer. In need of durable intravenous access
for chemotherapy administration.

EXAM:
IMPLANTED PORT A CATH PLACEMENT WITH ULTRASOUND AND FLUOROSCOPIC
GUIDANCE

[Series 1: ir fluoro/shunt/fist · 1 of 1 slices shown]
[im 1/1]
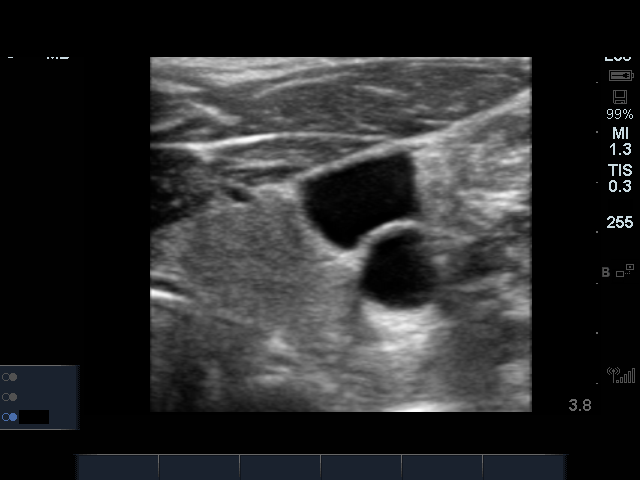

[1 of 1 positions shown; findings below may reference images not displayed]

MEDICATIONS:
Ancef 2 gm IV; The antibiotic was administered within an appropriate
time interval prior to skin puncture.

ANESTHESIA/SEDATION:
Moderate (conscious) sedation was employed during this procedure. A
total of Versed 3 mg and Fentanyl 100 mcg was administered
intravenously.

Moderate Sedation Time: 26 minutes. The patient's level of
consciousness and vital signs were monitored continuously by
radiology nursing throughout the procedure under my direct
supervision.

CONTRAST:  None

FLUOROSCOPY TIME:  24 seconds (2 mGy)

COMPLICATIONS:
None immediate.

PROCEDURE:
The procedure, risks, benefits, and alternatives were explained to
the patient. Questions regarding the procedure were encouraged and
answered. The patient understands and consents to the procedure.

The right neck and chest were prepped with chlorhexidine in a
sterile fashion, and a sterile drape was applied covering the
operative field. Maximum barrier sterile technique with sterile
gowns and gloves were used for the procedure. A timeout was
performed prior to the initiation of the procedure. Local anesthesia
was provided with 1% lidocaine with epinephrine.

After creating a small venotomy incision, a micropuncture kit was
utilized to access the internal jugular vein. Real-time ultrasound
guidance was utilized for vascular access including the acquisition
of a permanent ultrasound image documenting patency of the accessed
vessel. The microwire was utilized to measure appropriate catheter
length.

A subcutaneous port pocket was then created along the upper chest
wall utilizing a combination of sharp and blunt dissection. The
pocket was irrigated with sterile saline. A single lumen thin power
injectable port was chosen for placement. The 8 Fr catheter was
tunneled from the port pocket site to the venotomy incision. The
port was placed in the pocket. The external catheter was trimmed to
appropriate length. At the venotomy, an 8 Fr peel-away sheath was
placed over a guidewire under fluoroscopic guidance. The catheter
was then placed through the sheath and the sheath was removed. Final
catheter positioning was confirmed and documented with a
fluoroscopic spot radiograph. The port was accessed with [place]
needle, aspirated and flushed with heparinized saline.

The venotomy site was closed with an interrupted 4-0 Vicryl suture.
The port pocket incision was closed with interrupted 2-0 Vicryl
suture and the skin was opposed with a running subcuticular 4-0
Vicryl suture. Dermabond and Herme were applied to both
incisions. Dressings were placed. The patient tolerated the
procedure well without immediate post procedural complication.
FINDINGS: After catheter placement, the tip lies within the superior
cavoatrial junction. The catheter aspirates and flushes normally and
is ready for immediate use.
IMPRESSION: Successful placement of a right internal jugular approach power
injectable Port-A-Cath. The catheter is ready for immediate use.

## 2018-07-03 IMAGING — DX DG CHEST 2V
2 series · 2 of 2 positions shown · non-contrast
Comparison: 03/12/2015

CLINICAL DATA: Cough, weakness.  Fever.

EXAM:
CHEST  2 VIEW

[chest pa]
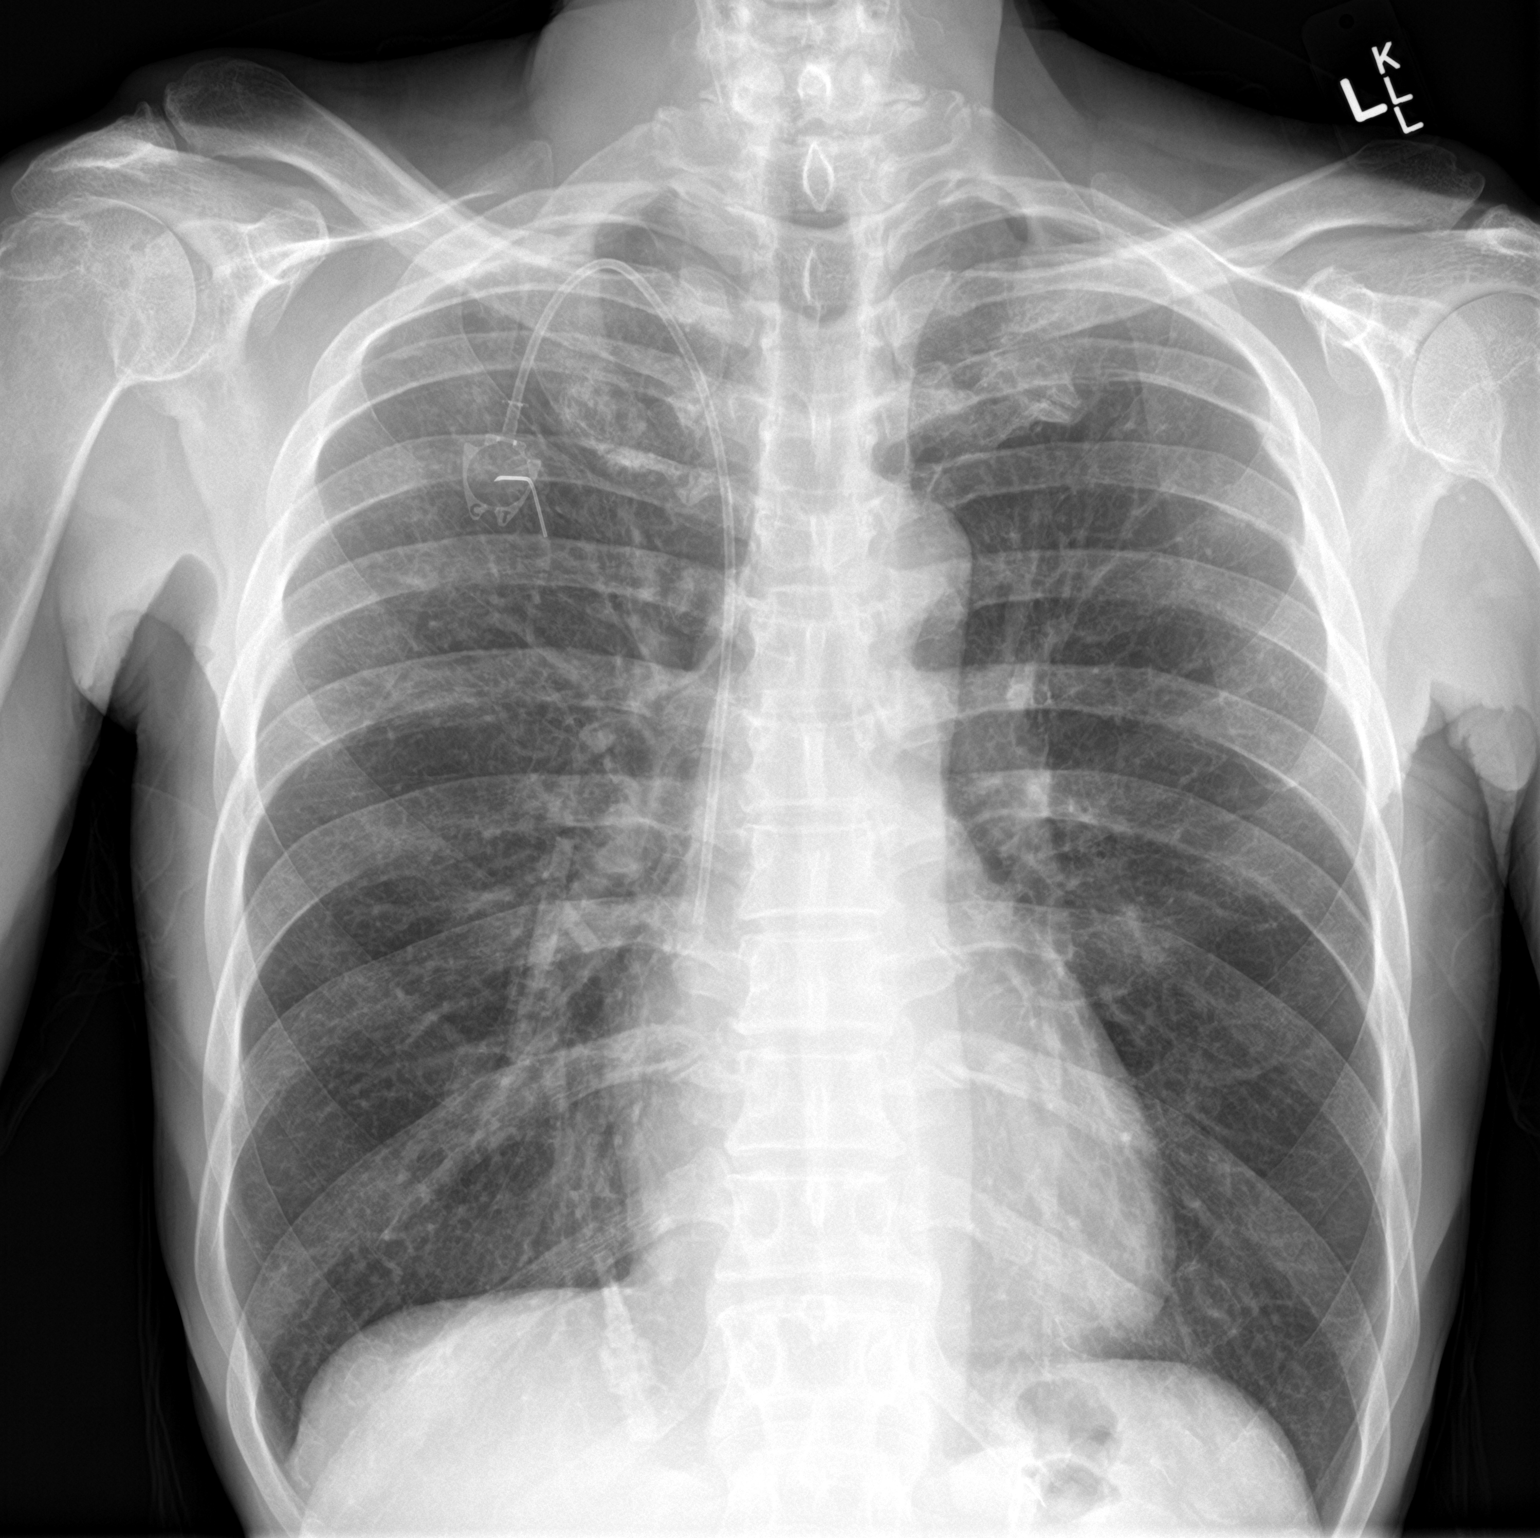

[chest lat]
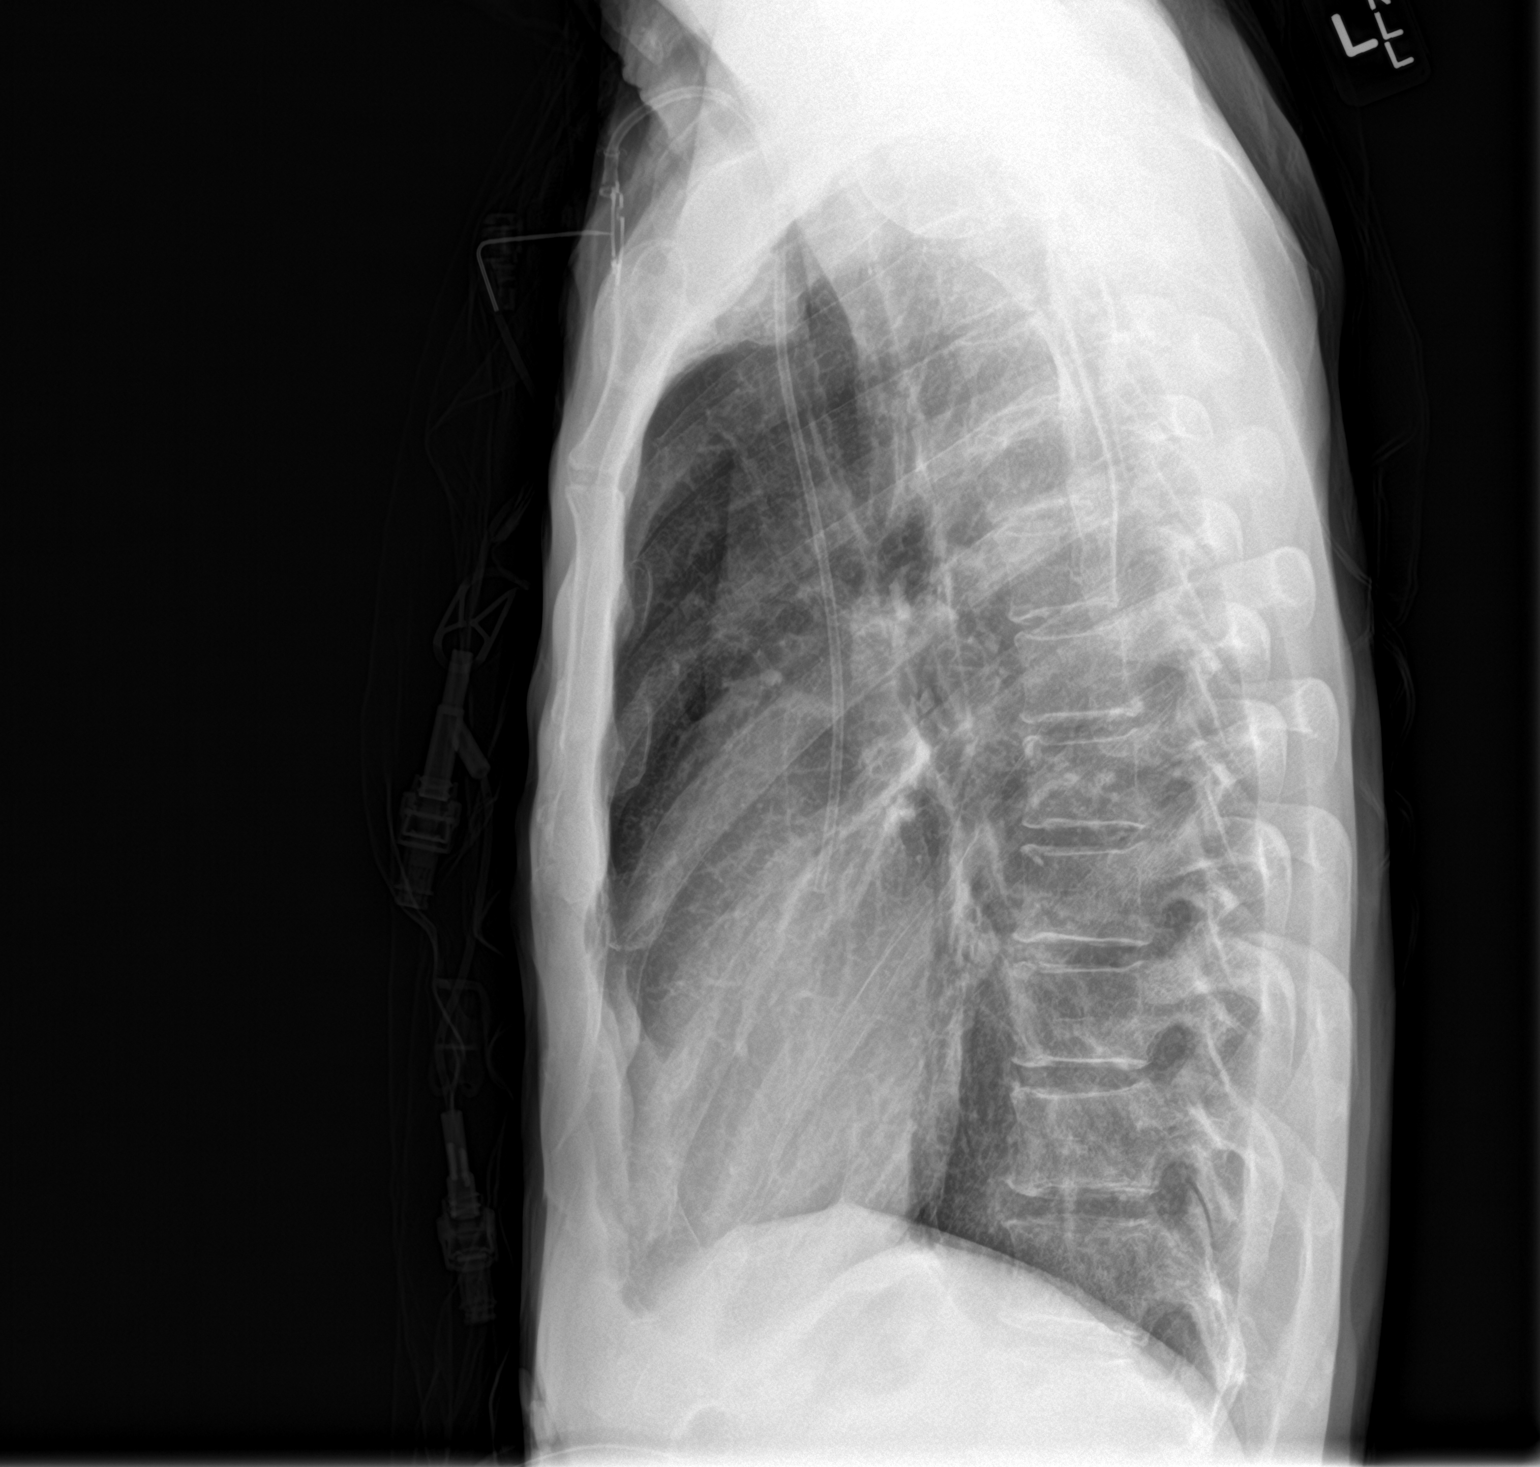

[2 of 2 positions shown; findings below may reference images not displayed]

FINDINGS: Right Port-A-Cath is in place with the tip at the cavoatrial
junction. Mild hyperinflation. Heart and mediastinal contours are
within normal limits. No focal opacities or effusions. No acute bony
abnormality.
IMPRESSION: Mild hyperinflation.  No active cardiopulmonary disease.

## 2018-07-10 ENCOUNTER — Emergency Department (HOSPITAL_COMMUNITY)
Admission: EM | Admit: 2018-07-10 | Discharge: 2018-07-10 | Disposition: A | Discharge status: Home / Self Care | Payer: Medicaid Other | Attending: Emergency Medicine | Admitting: Emergency Medicine

## 2018-07-10 ENCOUNTER — Other Ambulatory Visit: Payer: Self-pay

## 2018-07-10 ENCOUNTER — Encounter (HOSPITAL_COMMUNITY): Payer: Self-pay | Admitting: Emergency Medicine

## 2018-07-10 DIAGNOSIS — S41112A Laceration without foreign body of left upper arm, initial encounter: Secondary | ICD-10-CM

## 2018-07-10 DIAGNOSIS — S51812A Laceration without foreign body of left forearm, initial encounter: Secondary | ICD-10-CM | POA: Diagnosis present

## 2018-07-10 DIAGNOSIS — S61411A Laceration without foreign body of right hand, initial encounter: Secondary | ICD-10-CM | POA: Insufficient documentation

## 2018-07-10 DIAGNOSIS — Z79899 Other long term (current) drug therapy: Secondary | ICD-10-CM | POA: Diagnosis not present

## 2018-07-10 DIAGNOSIS — Y999 Unspecified external cause status: Secondary | ICD-10-CM | POA: Insufficient documentation

## 2018-07-10 DIAGNOSIS — Y929 Unspecified place or not applicable: Secondary | ICD-10-CM | POA: Diagnosis not present

## 2018-07-10 DIAGNOSIS — Z23 Encounter for immunization: Secondary | ICD-10-CM | POA: Diagnosis not present

## 2018-07-10 DIAGNOSIS — F17210 Nicotine dependence, cigarettes, uncomplicated: Secondary | ICD-10-CM | POA: Diagnosis not present

## 2018-07-10 DIAGNOSIS — W25XXXA Contact with sharp glass, initial encounter: Secondary | ICD-10-CM | POA: Insufficient documentation

## 2018-07-10 DIAGNOSIS — Y939 Activity, unspecified: Secondary | ICD-10-CM | POA: Insufficient documentation

## 2018-07-10 MED ORDER — LIDOCAINE HCL (PF) 1 % IJ SOLN
5.0000 mL | Freq: Once | INTRAMUSCULAR | Status: AC
  Administered 2018-07-10: 5 mL
  Filled 2018-07-10: qty 5

## 2018-07-10 MED ORDER — TETANUS-DIPHTH-ACELL PERTUSSIS 5-2.5-18.5 LF-MCG/0.5 IM SUSP
0.5000 mL | Freq: Once | INTRAMUSCULAR | Status: AC
  Administered 2018-07-10: 0.5 mL via INTRAMUSCULAR
  Filled 2018-07-10 (×2): qty 0.5

## 2018-07-10 NOTE — ED Triage Notes (Signed)
Pt. Stated, I was emptying a trash bag and a piece of glass came out and cut my left arm.

## 2018-07-10 NOTE — Discharge Instructions (Addendum)
Keep wounds clean and dry.  Wound recheck with your doctor in 2 days, staple removal in 7 to 10 days.

## 2018-07-10 NOTE — ED Provider Notes (Addendum)
[facility] Provider Note   CSN: [id number] Arrival date & time: 07/10/18  1418     History   Chief Complaint Chief Complaint  Patient presents with  . Extremity Laceration  . Arm Pain    HPI ABOU STERKEL is a 57 y.o. male.  57 year old male presents with laceration to the left forearm and right thumb.  Patient states that he was carrying the trash out today at when he was cut by a broken piece of glass in the back as he put the trash bag into the dumpster.  Patient is not on blood thinners, bleeding controlled with pressure.  Last tetanus was greater than 5 years ago.  No other injuries, complaints, concerns.     Past Medical History:  Diagnosis Date  . Arthritis   . Cancer ([place])    left tonsil cancer  . History of radiation therapy 09/01/17- 10/25/17   Left tonsil and bilateral neck 70 Gy in 35 fractions to gross disease, 63 gy in 35 fractions to high risk nodal echelons, and 56 Gy in 35 fraction to intermediate risk nodal echelons.     Patient Active Problem List   Diagnosis Date Noted  . Fever 10/22/2017  . Leukopenia 10/22/2017  . Thrush 10/22/2017  . Severe protein-calorie malnutrition ([place]) 10/08/2017  . Hyponatremia 10/07/2017  . Anemia 10/07/2017  . Neutropenic fever ([place]) 10/06/2017  . Pancytopenia, acquired ([place]) 10/01/2017  . Weight loss 10/01/2017  . Alcohol withdrawal syndrome without complication ([place]) 50/27/7412  . Tobacco abuse 08/12/2017  . 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 IR FLUORO GUIDE PORT INSERTION RIGHT  09/13/2017  . IR GASTROSTOMY TUBE MOD SED  09/13/2017  . IR GASTROSTOMY TUBE REMOVAL  03/28/2018  . IR REMOVAL TUN ACCESS W/ PORT W/O FL MOD SED  03/28/2018  . IR US GUIDE VASC ACCESS RIGHT  09/13/2017  . MULTIPLE EXTRACTIONS WITH ALVEOLOPLASTY N/A 08/09/2017   Procedure: Extraction of tooth #'s 1-6, 11-17,21,22,26-30  and 32 with alveoloplasty and bilateral mandibular tori reductions;  Surgeon: Lenn Cal, DDS;  Location: [facility];  Service: Oral Surgery;  Laterality: N/A;  . tonsil biopsy     left tonsil        Home Medications    Prior to Admission medications   Medication Sig Start Date End Date Taking? Authorizing Provider  acetaminophen (TYLENOL) 325 MG tablet Take 650 mg by mouth every 6 (six) hours as needed for moderate pain.     [provider]  phenol (CHLORASEPTIC) 1.4 % LIQD Use as directed 1 spray in the mouth or throat as needed for throat irritation / pain. 10/25/17   Barton Dubois, MD    Family History Family History  Problem Relation Age of Onset  . Diabetes Mother   . Colon cancer Neg Hx   . Esophageal cancer Neg Hx   . Liver cancer Neg Hx   . Pancreatic cancer Neg Hx   . Rectal cancer Neg Hx   . Stomach cancer Neg Hx     Social History Social History   Tobacco Use  . Smoking status: Light Tobacco Smoker    Packs/day: 1.00    Years: 30.00    Pack years: 30.00    Types: Cigarettes  . Smokeless tobacco: Never Used  . Tobacco comment: At least one cigarette daily  Substance Use Topics  . Alcohol use: Yes  Alcohol/week: 7.0 standard drinks    Types: 7 Cans of beer per week    Comment: 1 pint daily   . Drug use: No    Types: Cocaine    Comment: he has a past history of cocaine abuse, he denies current use     Allergies   Patient has no known allergies.   Review of Systems Review of Systems  Constitutional: Negative for chills and fever.  Musculoskeletal: Negative for arthralgias, joint swelling and myalgias.  Skin: Positive for wound.  Allergic/Immunologic: Positive for immunocompromised state.  Neurological: Negative for weakness and numbness.  Hematological: Does not bruise/bleed easily.  Psychiatric/Behavioral: Negative for confusion.  All other systems reviewed and are negative.    Physical Exam Updated Vital Signs Ht 5\' 7"   (1.702 m)   Wt 59 kg   BMI 20.36 kg/m   Physical Exam  Constitutional: He is oriented to person, place, and time. He appears well-developed and well-nourished. No distress.  HENT:  Head: Normocephalic and atraumatic.  Cardiovascular: Intact distal pulses.  Pulmonary/Chest: Effort normal.  Musculoskeletal: He exhibits tenderness. He exhibits no deformity.       Arms:      Hands: Neurological: He is alert and oriented to person, place, and time.  Skin: Skin is warm and dry. He is not diaphoretic.  Psychiatric: He has a normal mood and affect. His behavior is normal.  Nursing note and vitals reviewed.    ED Treatments / Results  Labs (all labs ordered are listed, but only abnormal results are displayed) Labs Reviewed - No data to display  EKG None  Radiology No results found.  Procedures .Marland KitchenLaceration Repair Date/Time: 07/10/2018 4:04 PM Performed by: Tacy Learn, PA-C Authorized by: Tacy Learn, PA-C   Consent:    Consent obtained:  Verbal   Consent given by:  Patient   Risks discussed:  Infection, need for additional repair, pain, poor cosmetic result, poor wound healing and retained foreign body   Alternatives discussed:  No treatment and delayed treatment Universal protocol:    Procedure explained and questions answered to patient or proxy's satisfaction: yes     Relevant documents present and verified: yes     Test results available and properly labeled: yes     Imaging studies available: yes     Required blood products, implants, devices, and special equipment available: yes     Site/side marked: yes     Immediately prior to procedure, a time out was called: yes     Patient identity confirmed:  Verbally with patient Anesthesia (see MAR for exact dosages):    Anesthesia method:  Local infiltration   Local anesthetic:  Lidocaine 1% w/o epi Laceration details:    Location:  Shoulder/arm   Shoulder/arm location:  L lower arm   Length (cm):  4   Depth  (mm):  3 Repair type:    Repair type:  Simple Pre-procedure details:    Preparation:  Patient was prepped and draped in usual sterile fashion Exploration:    Hemostasis achieved with:  Direct pressure   Wound exploration: wound explored through full range of motion and entire depth of wound probed and visualized     Wound extent: no foreign bodies/material noted and no muscle damage noted     Contaminated: no   Treatment:    Area cleansed with:  Saline   Amount of cleaning:  Standard   Irrigation solution:  Sterile saline   Irrigation method:  Pressure wash Skin repair:  Repair method:  Staples   Number of staples:  5 Approximation:    Approximation:  Close Post-procedure details:    Dressing:  Antibiotic ointment and bulky dressing .Marland KitchenLaceration Repair Date/Time: 07/10/2018 4:05 PM Performed by: Tacy Learn, PA-C Authorized by: Tacy Learn, PA-C   Consent:    Consent obtained:  Verbal   Consent given by:  Patient   Risks discussed:  Infection, need for additional repair, pain, poor cosmetic result, poor wound healing and retained foreign body   Alternatives discussed:  No treatment and delayed treatment Universal protocol:    Procedure explained and questions answered to patient or proxy's satisfaction: yes     Relevant documents present and verified: yes     Test results available and properly labeled: yes     Imaging studies available: yes     Required blood products, implants, devices, and special equipment available: yes     Site/side marked: yes     Immediately prior to procedure, a time out was called: yes     Patient identity confirmed:  Verbally with patient Anesthesia (see MAR for exact dosages):    Anesthesia method:  Local infiltration Laceration details:    Location:  Hand   Hand location:  R hand, dorsum   Length (cm):  1   Depth (mm):  2 Repair type:    Repair type:  Simple Pre-procedure details:    Preparation:  Patient was prepped and draped  in usual sterile fashion Treatment:    Area cleansed with:  Saline   Amount of cleaning:  Standard   Irrigation solution:  Sterile saline   Irrigation method:  Pressure wash Skin repair:    Repair method:  Steri-Strips   Number of Steri-Strips:  1 Post-procedure details:    Dressing:  Open (no dressing)   Patient tolerance of procedure:  Tolerated well, no immediate complications   (including critical care time)  Medications Ordered in ED Medications  lidocaine (PF) (XYLOCAINE) 1 % injection 5 mL (has no administration in time range)  Tdap (BOOSTRIX) injection 0.5 mL (has no administration in time range)     Initial Impression / Assessment and Plan / ED Course  I have reviewed the triage vital signs and the nursing notes.  Pertinent labs & imaging results that were available during my care of the patient were reviewed by me and considered in my medical decision making (see chart for details).     Final Clinical Impressions(s) / ED Diagnoses   Final diagnoses:  Arm laceration, left, initial encounter  Laceration of right hand without foreign body, initial encounter    ED Discharge Orders    None       Roque Lias 07/10/18 1605    Davonna Belling, MD 07/10/18 1607    Tacy Learn, PA-C 07/10/18 1610    Davonna Belling, MD 07/13/18 1510

## 2018-07-10 NOTE — ED Notes (Signed)
Patient verbalizes understanding of discharge instructions. Opportunity for questioning and answers were provided. Armband removed by staff, pt discharged from ED ambulatory.   

## 2018-07-20 ENCOUNTER — Encounter (HOSPITAL_COMMUNITY): Payer: Self-pay | Admitting: Family Medicine

## 2018-07-20 ENCOUNTER — Ambulatory Visit (HOSPITAL_COMMUNITY)
Admission: EM | Admit: 2018-07-20 | Discharge: 2018-07-20 | Disposition: A | Discharge status: Home / Self Care | Payer: Medicaid Other | Attending: Family Medicine | Admitting: Family Medicine

## 2018-07-20 DIAGNOSIS — Z4802 Encounter for removal of sutures: Secondary | ICD-10-CM | POA: Diagnosis not present

## 2018-07-20 DIAGNOSIS — S51812A Laceration without foreign body of left forearm, initial encounter: Secondary | ICD-10-CM | POA: Diagnosis not present

## 2018-07-20 NOTE — ED Triage Notes (Signed)
Pt here for staple removal from left arm

## 2018-09-05 ENCOUNTER — Inpatient Hospital Stay: Payer: Medicaid Other | Attending: Internal Medicine | Admitting: Internal Medicine

## 2018-09-05 ENCOUNTER — Inpatient Hospital Stay: Payer: Medicaid Other

## 2018-09-09 ENCOUNTER — Encounter: Payer: Self-pay | Admitting: *Deleted

## 2018-09-09 NOTE — Progress Notes (Signed)
Oncology Nurse Navigator Documentation  Oncology Nurse Navigator Flowsheets 09/09/2018  Navigator Location [facility]  Navigator Encounter Type Other/[name] was a no show to his appt this week with Dr. Julien Nordmann.  He needs to be re-scheduled according to Dr. Julien Nordmann. I updated scheduler to call and schedule him to be seen on 09/12/18.    Barriers/Navigation Needs Coordination of Care  Interventions Coordination of Care  Coordination of Care Other  Acuity Level 2  Time Spent with Patient 30

## 2018-09-12 ENCOUNTER — Telehealth: Payer: Self-pay | Admitting: Internal Medicine

## 2018-09-12 NOTE — Telephone Encounter (Signed)
Rescheduled the pt's appt to 11/29 at 1030am , labs at [place] the pt a vm with the appt date and time.

## 2018-09-16 ENCOUNTER — Inpatient Hospital Stay: Payer: Medicaid Other

## 2018-09-16 ENCOUNTER — Inpatient Hospital Stay: Payer: Medicaid Other | Admitting: Internal Medicine

## 2018-09-20 ENCOUNTER — Telehealth: Payer: Self-pay | Admitting: Internal Medicine

## 2018-09-20 NOTE — Telephone Encounter (Signed)
lft the pt a vm to cb and reschedule appt to see Dr. Maylon Peppers instead of Dr. Julien Nordmann

## 2018-10-06 ENCOUNTER — Telehealth: Payer: Self-pay | Admitting: *Deleted

## 2018-10-06 NOTE — Telephone Encounter (Signed)
Oncology Nurse Navigator Documentation  LVMM for Mr. Zelek asking for call-back re scheduling of MedOnc post-tmt follow-up.  Gayleen Orem, RN, BSN Head & Neck Oncology Nurse [place] at [place] [phone]

## 2018-10-06 NOTE — Telephone Encounter (Signed)
A user error has taken place: encounter opened in error, closed for administrative reasons.

## 2018-10-20 ENCOUNTER — Telehealth: Payer: Self-pay | Admitting: *Deleted

## 2018-10-20 NOTE — Telephone Encounter (Signed)
Oncology Nurse Navigator Documentation  LVMM asking for call-back re scheduling of post-tmt MedOnc follow-up.  Gayleen Orem, RN, BSN Head & Neck Oncology Nurse [place] at [place] [phone]

## 2018-10-28 IMAGING — PT NM PET TUM IMG RESTAG (PS) SKULL BASE T - THIGH
1 of 7 series · 1 of 25 positions shown · non-contrast
Comparison: 08/16/2017 and CT neck 07/27/2017.

CLINICAL DATA: Subsequent treatment strategy for tonsillar cancer.

EXAM:
NUCLEAR MEDICINE PET SKULL BASE TO THIGH
TECHNIQUE: 6.3 mCi F-18 FDG was injected intravenously. Full-ring PET imaging
was performed from the skull base to thigh after the radiotracer. CT
data was obtained and used for attenuation correction and anatomic
localization.
Fasting blood glucose: 133 mg/dl

[Series 4: ct hn_sk_th 5.0 b31f · axial · 5.0mm · 0.98mm/px · 1 of 239 slices shown]
[im 239/239  brain]
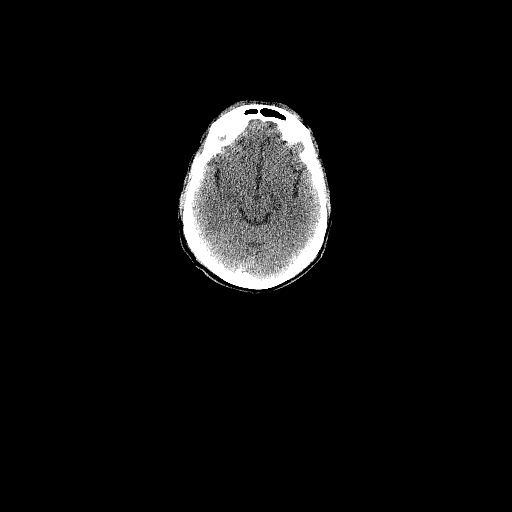

[1 of 25 positions shown; findings below may reference images not displayed]

FINDINGS: Mediastinal blood pool activity: SUV max

NECK: Very mild asymmetric metabolism in the right
sternocleidomastoid, at the level of the hyoid bone. No
hypermetabolic lymph nodes.

Incidental CT findings: None.

CHEST: No hypermetabolic mediastinal, hilar or axillary lymph nodes.
No hypermetabolic pulmonary nodules.

Incidental CT findings: Right IJ Port-A-Cath terminates in the high
right atrium. Coronary artery calcification. No pericardial or
pleural effusion. Centrilobular and paraseptal emphysema.

ABDOMEN/PELVIS: No abnormal hypermetabolism in the liver, adrenal
glands, spleen or pancreas. No hypermetabolic lymph nodes.

Incidental CT findings: Percutaneous gastrostomy. Atherosclerotic
calcification of the arterial vasculature. No free fluid.

SKELETON: No abnormal osseous hypermetabolism.

Incidental CT findings: Degenerative changes in the spine.
IMPRESSION: 1. No evidence recurrent or metastatic disease.
2. Aortic atherosclerosis ([id number]). Coronary artery
calcification.

## 2018-12-20 ENCOUNTER — Telehealth: Payer: Self-pay | Admitting: *Deleted

## 2018-12-20 NOTE — Telephone Encounter (Signed)
Oncology Nurse Navigator Documentation  Returned call to Mr. Cordts who had LVMM with new phone #. I explained importance of post-tmt follow-up with Medical Oncology since last appt in May 2019 with Dr. Lebron Conners.  I indicated an appt will be scheduled with Dr. Maylon Peppers, his next available at [facility] tentatively 3/11 4:00.  He declined earlier appt at [facility].    Gayleen Orem, RN, BSN Head & Neck Oncology Nurse [place] at [place] [phone]

## 2018-12-21 ENCOUNTER — Telehealth: Payer: Self-pay | Admitting: *Deleted

## 2018-12-21 NOTE — Telephone Encounter (Signed)
Oncology Nurse Navigator Documentation  Rec'd call-back from pt's sister, Rise Paganini, confirming my message re next [facility] 4:00 appt with Dr. Maylon Peppers.  She indicated she will be accompanying him.  Gayleen Orem, RN, BSN Head & Neck Oncology Nurse [place] at [place] [phone]

## 2018-12-21 NOTE — Telephone Encounter (Addendum)
Oncology Nurse Navigator Documentation  LVMM for patient and his sister confirming 3/11 4:00 appt with Dr. Maylon Peppers, requested call back to confirm message receipt.  Gayleen Orem, RN, BSN Head & Neck Oncology Nurse [place] at [place] [phone]

## 2018-12-22 ENCOUNTER — Telehealth: Payer: Self-pay | Admitting: *Deleted

## 2018-12-22 NOTE — Telephone Encounter (Signed)
Oncology Nurse Navigator Documentation  Faxed request to [facility] to coordinate with ENT Janace Hoard next available.  Pt has not been seen by ENT since completion of chemo/RT January 2019.  Gayleen Orem, RN, BSN Head & Neck Oncology Nurse [place] at [place] [phone]

## 2018-12-27 ENCOUNTER — Other Ambulatory Visit: Payer: Self-pay | Admitting: Hematology

## 2018-12-27 DIAGNOSIS — C099 Malignant neoplasm of tonsil, unspecified: Secondary | ICD-10-CM

## 2018-12-27 NOTE — Progress Notes (Signed)
[place] OFFICE PROGRESS NOTE  Patient Care Team: Nolene Ebbs, MD as PCP - General (Internal Medicine) Melissa Montane, MD as Consulting Physician (Otolaryngology) Eppie Gibson, MD as Attending Physician (Radiation Oncology) Leota Sauers, RN as Oncology Nurse Navigator (Oncology) Karie Mainland, RD as Dietitian (Nutrition) Jomarie Longs, PT (Inactive) as Physical Therapist (Physical Therapy) Kennith Center, LCSW as Social Worker Schinke, Perry Mount, CCC-SLP as Psychologist, clinical (Speech Pathology)  HEME/ONC OVERVIEW: 1. 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 Lebron Conners -Patient received definitive chemoradiation with weekly carbo/Taxol, completed in 10/2017 -Port and PEG removed in 03/2018 -As the patient has not had any surveillance by ENT since 2019, I have referred the patient back to Dr. Janace Hoard for fiberoptic exam -He will require every 3-6 month visits with medical oncology for thyroid function monitoring -In the absence of clinically suspicious symptoms, there is no indication for routine imaging surveillance -Counseled the patient on any suspicious symptoms, such as unexplained fever, night sweats, weight loss, lymphadenopathy, pain or difficulty with swallowing, for which he should contact the clinic promptly  Tobacco abuse -Patient reports that he resumed smoking since mid 2019, currently smoking half pack per day -I spent some time counseling the patient the importance of tobacco cessation. -We discussed common strategies including nicotine patches, Tobacco Quit-line,  and other nicotine replacement products to assist in the patient's effort to quit. -The patient is interested in quitting smoking and will follow up with his PCP for further discussion of tobacco cessation strategies  Elevated LFT's -Patient reports that he currently drinks 2 beers per day, although I suspect that he may be drinking more, given the elevated LFTs -I counseled the patient on the importance of alcohol abstinence, given his history of cancer and elevated LFTs -Patient expressed understanding, and agreed to the plan  Leukopenia -Likely secondary to recent chemoradiation -WBC 3.4k with ANC 1400, stable -Patient denies any symptoms of infection -We will monitor it for now  Thrombocytopenia  -Likely secondary to recent chemoradiation -Plts 92k, stable -Patient denies any symptoms of bleeding -We will monitor for now  Orders Placed This Encounter  Procedures  . CBC with Differential (Cancer Center Only)    Standing Status:   Future    Standing Expiration Date:   02/01/2020  . CMP ([facility] only)    Standing Status:   Future    Standing Expiration Date:   02/01/2020  . TSH    Standing Status:   Future    Standing Expiration Date:   12/28/2019  . T4, free    Standing Status:   Future    Standing Expiration Date:   12/28/2019    All questions were answered. The patient knows to call the clinic with any problems, questions or concerns. No barriers to learning was detected.  A total of more than 25 minutes were spent face-to-face with the patient during this encounter and over half of that time was spent on counseling and coordination of care as outlined above.   Return in 3 months for labs and clinic follow-up.   Christian Men, MD 12/28/2018 4:47 PM  CHIEF  COMPLAINT: "I am doing alright"  INTERVAL HISTORY: [name] returns to clinic for follow-up of squamous cell carcinoma of the left tonsil status post chemoradiation.  Patient reports that he began to resume smoking  in mid 2019 after he completed chemoradiation, and has been smoking approximately half pack per day.  He also has been drinking intermittently, generally 2 beers per day.  He denies any illicit drug use.  He reports that over the past 2 weeks he he developed developed a mild tender spot near the right angle of the jaw, intermittent, nonradiating, not associated to any difficulty with chewing.  He was prescribed Chloraseptic spray by his PCP, which did not help.  He denies any fever, chill, night sweats, or lymphadenopathy.  He rode a bike this morning for 10 miles, and is very active.  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 There are foci suspicious but not definitive for invasion. p16 immunohistochemistry is negative in the neoplastic cells. FINAL DIAGNOSIS     07/27/2017 Imaging    CT neck: Indistinct tonsillar enlargement on the left in the region of left tonsillar mass biopsy. This could be a combination of post biopsy edema and residual mass. Margins are not discrete and accurate measurement cannot be performed. This is estimated at about 3 cm in size. No evidence of metastatic adenopathy.    08/16/2017 Imaging    PET-CT: Hypermetabolic disease in the left palatal fossa.  Indeterminate level 3 lymph node which may be reactive considering recent dental extractions.    09/01/2017 -  Radiation Therapy       09/01/2017 -  Chemotherapy    Carbboplatin AUC2,d1 + Paclitaxel 45mg /m2, d1 Q7d --Week #1, 08/14/24: Complicated by some burning/soreness in the throat. --Week #2, 09/08/17: --Week #3, 09/15/17: --Week #4, 09/22/17: --Week #5, 36/64/40: Complicated by admission for neutropenic fever 12/19-22/18 --Week #6, 10/15/17:     01/31/2018 PET scan    NECK: Very mild asymmetric metabolism in the right sternocleidomastoid, at the level of the hyoid bone. No hypermetabolic lymph nodes.  CHEST: No hypermetabolic mediastinal, hilar or axillary lymph nodes. No hypermetabolic pulmonary nodules.     REVIEW OF SYSTEMS:   Constitutional: ( - ) fevers, ( - )  chills , ( - ) night sweats Eyes: ( - ) blurriness of vision, ( - ) double vision, ( - ) watery eyes Ears, nose, mouth, throat, and face: ( - ) mucositis, ( - ) sore throat Respiratory: ( - ) cough, ( - ) dyspnea, ( - ) wheezes Cardiovascular: ( - ) palpitation, ( - ) chest discomfort, ( - ) lower extremity swelling Gastrointestinal:  ( - ) nausea, ( - ) heartburn, ( - ) change in bowel habits Skin: ( - ) abnormal skin rashes Lymphatics: ( - ) new lymphadenopathy, ( - ) easy bruising Neurological: ( - ) numbness, ( - ) tingling, ( - ) new weaknesses Behavioral/Psych: ( - ) mood change, ( - ) new changes  All other systems were reviewed with the patient and are negative.  I have reviewed the past medical history, past surgical history, social history and family history with the patient and they are unchanged from previous note.  ALLERGIES:  has No  Known Allergies.  MEDICATIONS:  Current Outpatient Medications  Medication Sig Dispense Refill  . acetaminophen (TYLENOL) 325 MG tablet Take 650 mg by mouth every 6 (six) hours as needed for moderate pain.      No current facility-administered medications for this visit.     PHYSICAL EXAMINATION: ECOG PERFORMANCE STATUS: 0 - Asymptomatic  Today's Vitals   12/28/18 1553 12/28/18 1559  BP: 103/73   Pulse: 80   Resp: 18   Temp: 98.4 F (36.9 C)   TempSrc: Oral   SpO2: 100%   Weight: 124 lb 9.6 oz (56.5 kg)   Height: 5\' 7"  (1.702 m)   PainSc:  0-No pain   Body mass index is 19.52 kg/m.  Filed Weights   12/28/18 1553  Weight: 124 lb 9.6 oz (56.5 kg)    GENERAL: alert, no distress and comfortable,  thin SKIN: skin color, texture, turgor are normal, no rashes or significant lesions EYES: conjunctiva are pink and non-injected, sclera clear OROPHARYNX: no exudate, no erythema; lips, buccal mucosa, and tongue normal  NECK: supple, non-tender LYMPH:  no palpable lymphadenopathy in the cervical LUNGS: clear to auscultation with normal breathing effort HEART: regular rate & rhythm and no murmurs and no lower extremity edema ABDOMEN: soft, non-tender, non-distended, normal bowel sounds Musculoskeletal: no cyanosis of digits and no clubbing  PSYCH: alert & oriented x 3, fluent speech NEURO: no focal motor/sensory deficits  LABORATORY DATA:  I have reviewed the data as listed    Component Value Date/Time   NA 139 12/28/2018 1534   NA 136 10/13/2017 1320   K 4.2 12/28/2018 1534   K 4.5 10/13/2017 1320   CL 100 12/28/2018 1534   CO2 28 12/28/2018 1534   CO2 31 (H) 10/13/2017 1320   GLUCOSE 104 (H) 12/28/2018 1534   GLUCOSE 96 10/13/2017 1320   BUN 14 12/28/2018 1534   BUN 12.1 10/13/2017 1320   CREATININE 0.79 12/28/2018 1534   CREATININE 0.7 10/13/2017 1320   CALCIUM 10.0 12/28/2018 1534   CALCIUM 9.8 10/13/2017 1320   PROT 8.0 12/28/2018 1534   PROT 7.2 10/13/2017 1320   ALBUMIN 4.6 12/28/2018 1534   ALBUMIN 3.3 (L) 10/13/2017 1320   AST 48 (H) 12/28/2018 1534   AST 29 10/13/2017 1320   ALT 30 12/28/2018 1534   ALT 26 10/13/2017 1320   ALKPHOS 60 12/28/2018 1534   ALKPHOS 79 10/13/2017 1320   BILITOT 0.5 12/28/2018 1534   BILITOT <0.22 10/13/2017 1320   GFRNONAA >60 12/28/2018 1534   GFRAA >60 12/28/2018 1534    No results found for: SPEP, UPEP  Lab Results  Component Value Date   WBC 3.4 (L) 12/28/2018   NEUTROABS 1.4 (L) 12/28/2018   HGB 12.1 (L) 12/28/2018   HCT 37.7 (L) 12/28/2018   MCV 88.7 12/28/2018   PLT 92 (L) 12/28/2018      Chemistry      Component Value Date/Time   NA 139 12/28/2018 1534   NA 136 10/13/2017 1320   K 4.2 12/28/2018 1534   K 4.5  10/13/2017 1320   CL 100 12/28/2018 1534   CO2 28 12/28/2018 1534   CO2 31 (H) 10/13/2017 1320   BUN 14 12/28/2018 1534   BUN 12.1 10/13/2017 1320   CREATININE 0.79 12/28/2018 1534   CREATININE 0.7 10/13/2017 1320      Component Value Date/Time   CALCIUM 10.0 12/28/2018 1534   CALCIUM 9.8 10/13/2017 1320   ALKPHOS 60 12/28/2018 1534   ALKPHOS 79  10/13/2017 1320   AST 48 (H) 12/28/2018 1534   AST 29 10/13/2017 1320   ALT 30 12/28/2018 1534   ALT 26 10/13/2017 1320   BILITOT 0.5 12/28/2018 1534   BILITOT <0.22 10/13/2017 1320

## 2018-12-28 ENCOUNTER — Inpatient Hospital Stay: Payer: Medicaid Other | Attending: Hematology | Admitting: Hematology

## 2018-12-28 ENCOUNTER — Telehealth: Payer: Self-pay | Admitting: Hematology

## 2018-12-28 ENCOUNTER — Encounter: Payer: Self-pay | Admitting: *Deleted

## 2018-12-28 ENCOUNTER — Inpatient Hospital Stay: Payer: Medicaid Other

## 2018-12-28 ENCOUNTER — Other Ambulatory Visit: Payer: Self-pay

## 2018-12-28 ENCOUNTER — Encounter: Payer: Self-pay | Admitting: Hematology

## 2018-12-28 VITALS — BP 103/73 | HR 80 | Temp 98.4°F | Resp 18 | Ht 67.0 in | Wt 124.6 lb

## 2018-12-28 DIAGNOSIS — Z923 Personal history of irradiation: Secondary | ICD-10-CM | POA: Diagnosis not present

## 2018-12-28 DIAGNOSIS — Z720 Tobacco use: Secondary | ICD-10-CM

## 2018-12-28 DIAGNOSIS — F17210 Nicotine dependence, cigarettes, uncomplicated: Secondary | ICD-10-CM | POA: Diagnosis not present

## 2018-12-28 DIAGNOSIS — D696 Thrombocytopenia, unspecified: Secondary | ICD-10-CM | POA: Diagnosis not present

## 2018-12-28 DIAGNOSIS — R7989 Other specified abnormal findings of blood chemistry: Secondary | ICD-10-CM | POA: Diagnosis not present

## 2018-12-28 DIAGNOSIS — D72819 Decreased white blood cell count, unspecified: Secondary | ICD-10-CM | POA: Insufficient documentation

## 2018-12-28 DIAGNOSIS — Z9221 Personal history of antineoplastic chemotherapy: Secondary | ICD-10-CM | POA: Diagnosis not present

## 2018-12-28 DIAGNOSIS — C099 Malignant neoplasm of tonsil, unspecified: Secondary | ICD-10-CM

## 2018-12-28 DIAGNOSIS — R945 Abnormal results of liver function studies: Secondary | ICD-10-CM

## 2018-12-28 LAB — CBC WITH DIFFERENTIAL (CANCER CENTER ONLY)
ABS IMMATURE GRANULOCYTES: 0.01 10*3/uL (ref 0.00–0.07)
BASOS PCT: 1 %
Basophils Absolute: 0 10*3/uL (ref 0.0–0.1)
Eosinophils Absolute: 0.1 10*3/uL (ref 0.0–0.5)
Eosinophils Relative: 4 %
HEMATOCRIT: 37.7 % — AB (ref 39.0–52.0)
Hemoglobin: 12.1 g/dL — ABNORMAL LOW (ref 13.0–17.0)
IMMATURE GRANULOCYTES: 0 %
LYMPHS ABS: 1.1 10*3/uL (ref 0.7–4.0)
Lymphocytes Relative: 33 %
MCH: 28.5 pg (ref 26.0–34.0)
MCHC: 32.1 g/dL (ref 30.0–36.0)
MCV: 88.7 fL (ref 80.0–100.0)
Monocytes Absolute: 0.7 10*3/uL (ref 0.1–1.0)
Monocytes Relative: 20 %
NEUTROS ABS: 1.4 10*3/uL — AB (ref 1.7–7.7)
NEUTROS PCT: 42 %
PLATELETS: 92 10*3/uL — AB (ref 150–400)
RBC: 4.25 MIL/uL (ref 4.22–5.81)
RDW: 14.9 % (ref 11.5–15.5)
WBC Count: 3.4 10*3/uL — ABNORMAL LOW (ref 4.0–10.5)
nRBC: 0 % (ref 0.0–0.2)

## 2018-12-28 LAB — CMP (CANCER CENTER ONLY)
ALT: 30 U/L (ref 0–44)
AST: 48 U/L — ABNORMAL HIGH (ref 15–41)
Albumin: 4.6 g/dL (ref 3.5–5.0)
Alkaline Phosphatase: 60 U/L (ref 38–126)
Anion gap: 11 (ref 5–15)
BUN: 14 mg/dL (ref 6–20)
CHLORIDE: 100 mmol/L (ref 98–111)
CO2: 28 mmol/L (ref 22–32)
Calcium: 10 mg/dL (ref 8.9–10.3)
Creatinine: 0.79 mg/dL (ref 0.61–1.24)
GFR, Est AFR Am: >60 mL/min (ref >60)
GFR, Estimated: >60 mL/min (ref >60)
GLUCOSE: 104 mg/dL — AB (ref 70–99)
Potassium: 4.2 mmol/L (ref 3.5–5.1)
SODIUM: 139 mmol/L (ref 135–145)
Total Bilirubin: 0.5 mg/dL (ref 0.3–1.2)
Total Protein: 8 g/dL (ref 6.5–8.1)

## 2018-12-28 LAB — T4, FREE: FREE T4: 0.74 ng/dL — AB (ref 0.82–1.77)

## 2018-12-28 NOTE — Telephone Encounter (Signed)
Gave avs and calendar ° °

## 2018-12-29 ENCOUNTER — Other Ambulatory Visit: Payer: Self-pay | Admitting: Hematology

## 2018-12-29 ENCOUNTER — Telehealth: Payer: Self-pay | Admitting: *Deleted

## 2018-12-29 DIAGNOSIS — E039 Hypothyroidism, unspecified: Secondary | ICD-10-CM

## 2018-12-29 LAB — TSH: TSH: 12.402 u[IU]/mL — AB (ref 0.320–4.118)

## 2018-12-29 MED ORDER — LEVOTHYROXINE SODIUM 50 MCG PO TABS: 50.0000 ug | ORAL_TABLET | Freq: Every day | ORAL | 3 refills | Status: DC

## 2018-12-29 NOTE — Telephone Encounter (Signed)
Oncology Nurse [facility] in follow-up to my 3/5 fax requesting scheduling of post-tmt follow-up with Dr. Janace Hoard, spoke with Percival.  I confirmed pt contact information, asked that pt be contacted to schedule next available appt with Dr. Janace Hoard. She agreed to call me with appt information.  Gayleen Orem, RN, BSN Head & Neck Oncology Nurse [place] at [place] [phone]

## 2018-12-29 NOTE — Progress Notes (Signed)
Oncology Nurse Navigator Documentation  To provide continuity of care, met with [name] during Est. Pt. appt with Dr. Maylon Peppers.  He was last seen by Dr. Lebron Conners 03/07/18. He reported:  Now has full set of dentures, slowly gaining weight now that he can eat closer to baseline.  Biking every day, sometimes up to 10 miles.  Smoking 1/2 ppd and drinking a couple of beers daily, denied drug use.  Intermittent R neck/throat irritation.  He acknowledged may be r/t smoking/drinking.  Has not received call for follow-up with ENT Janace Hoard per this navigator's 3/5 fax request.  Interventions  Navigator provided smoking cessation information which included free classes offered at [facility].  Navigator to follow-up with ENT office.  Gayleen Orem, RN, BSN Head & Neck Oncology Nurse [place] at [place] [phone]

## 2018-12-30 ENCOUNTER — Other Ambulatory Visit: Payer: Self-pay | Admitting: Hematology

## 2018-12-30 ENCOUNTER — Telehealth: Payer: Self-pay | Admitting: *Deleted

## 2018-12-30 DIAGNOSIS — E039 Hypothyroidism, unspecified: Secondary | ICD-10-CM

## 2018-12-30 MED ORDER — LEVOTHYROXINE SODIUM 50 MCG PO TABS: 50.0000 ug | ORAL_TABLET | Freq: Every day | ORAL | 3 refills | Status: DC

## 2018-12-31 NOTE — Telephone Encounter (Signed)
Oncology Nurse Navigator Documentation  Called pt to inform him TSH lab indicated underactive thyroid, Dr. Maylon Peppers issued Rx for levothyroxine to be picked up at preferred pharmacy.  He acknowledged.  He reported appt with ENT Janace Hoard scheduled for 01/12/19.  Gayleen Orem, RN, BSN Head & Neck Oncology Nurse [place] at [place] [phone]

## 2019-02-22 ENCOUNTER — Telehealth: Payer: Self-pay | Admitting: *Deleted

## 2019-02-22 NOTE — Telephone Encounter (Signed)
Called patient to update regarding test, lvm for a return call

## 2019-02-23 ENCOUNTER — Telehealth: Payer: Self-pay | Admitting: *Deleted

## 2019-02-23 NOTE — Telephone Encounter (Signed)
xxxx 

## 2019-02-23 NOTE — Telephone Encounter (Signed)
Called patient to inform that scan needs to be precerted, then I can schedule it and his fu appt. the day after for results, spoke with patient and he is aware of this

## 2019-02-24 ENCOUNTER — Ambulatory Visit: Payer: Medicaid Other | Admitting: Radiation Oncology

## 2019-03-08 ENCOUNTER — Other Ambulatory Visit: Payer: Self-pay | Admitting: Radiation Oncology

## 2019-03-08 DIAGNOSIS — Z122 Encounter for screening for malignant neoplasm of respiratory organs: Secondary | ICD-10-CM

## 2019-03-08 DIAGNOSIS — Z720 Tobacco use: Secondary | ICD-10-CM

## 2019-03-09 ENCOUNTER — Telehealth: Payer: Self-pay | Admitting: *Deleted

## 2019-03-09 NOTE — Telephone Encounter (Signed)
CALLED PATIENT TO INFORM THAT CT HAS BEEN SCHEDULED FOR 03-14-19 - ARRIVAL TIME- 3:15 PM @ [facility], NO RESTRICTIONS TO TEST , AND PATIENT TO FOLLOW-UP WITH DR. Isidore Moos FOR RESULTS ON 03-15-19 @ 3:30 PM, SPOKE WITH PATIENT AND HE IS AWARE OF THESE APPTS.

## 2019-03-10 ENCOUNTER — Ambulatory Visit: Payer: Medicaid Other | Admitting: Radiation Oncology

## 2019-03-14 ENCOUNTER — Other Ambulatory Visit: Payer: Self-pay | Admitting: Radiation Oncology

## 2019-03-14 ENCOUNTER — Ambulatory Visit (HOSPITAL_COMMUNITY)
Admission: RE | Admit: 2019-03-14 | Discharge: 2019-03-14 | Disposition: A | Discharge status: Home / Self Care | Payer: Medicaid Other | Source: Ambulatory Visit | Attending: Radiation Oncology | Admitting: Radiation Oncology

## 2019-03-14 ENCOUNTER — Other Ambulatory Visit: Payer: Self-pay

## 2019-03-14 ENCOUNTER — Telehealth: Payer: Self-pay | Admitting: *Deleted

## 2019-03-14 DIAGNOSIS — Z122 Encounter for screening for malignant neoplasm of respiratory organs: Secondary | ICD-10-CM

## 2019-03-14 DIAGNOSIS — C099 Malignant neoplasm of tonsil, unspecified: Secondary | ICD-10-CM | POA: Diagnosis present

## 2019-03-14 DIAGNOSIS — Z720 Tobacco use: Secondary | ICD-10-CM

## 2019-03-14 NOTE — Telephone Encounter (Signed)
Oncology Nurse Navigator Documentation  Called [name] to remind him of today's 3:30 CT Chest, [facility].  He voiced understanding, stated he wd arrive "early" and where a mask.  Gayleen Orem, RN, BSN Head & Neck Oncology Nurse [place] at [place] [phone]

## 2019-03-15 ENCOUNTER — Ambulatory Visit
Admission: RE | Admit: 2019-03-15 | Discharge: 2019-03-15 | Disposition: A | Discharge status: Home / Self Care | Payer: Medicaid Other | Source: Ambulatory Visit | Attending: Radiation Oncology | Admitting: Radiation Oncology

## 2019-03-15 DIAGNOSIS — R911 Solitary pulmonary nodule: Secondary | ICD-10-CM

## 2019-03-15 DIAGNOSIS — C090 Malignant neoplasm of tonsillar fossa: Secondary | ICD-10-CM

## 2019-03-15 NOTE — Progress Notes (Signed)
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 Solitary pulmonary nodule R91.1 CT Chest Wo Contrast  2. 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 gentleman with T3N0M0 Squamous cell carcinomaofleft tonsil/palate, p16 negative.  Left tonsil and bilateral neck directed with 70 Gy in 35 fractions on 09/01/17 to 10/25/17.  CHIEF COMPLAINT:  I feel okay  Narrative:  He is doing well. No masses or swallowing issues or throat pain.  NED per appt w/ med onc 2 mo ago.  CT chest done yesterday, I reviewed the images myself.  Two ground glass opacities noted.  ALLERGIES:  has No Known Allergies.  Meds: Current Outpatient Medications  Medication Sig Dispense Refill  . acetaminophen (TYLENOL) 325 MG tablet Take 650 mg by mouth every 6 (six) hours as needed for moderate pain.     Marland Kitchen levothyroxine (SYNTHROID) 50 MCG tablet Take 1 tablet (50 mcg total) by mouth daily before breakfast for 30 days. 30 tablet 3   No current facility-administered medications for this encounter.     Physical Findings: The patient is in no acute distress. Patient is alert and oriented. Wt Readings from Last 3 Encounters:  12/28/18 124 lb 9.6 oz (56.5 kg)  07/10/18 130 lb (59 kg)  03/07/18 126 lb 14.4 oz (57.6 kg)    vitals were not taken for this visit. .    Lab Findings: Lab Results  Component Value Date   WBC 3.4 (L) 12/28/2018   HGB 12.1 (L) 12/28/2018   HCT 37.7 (L) 12/28/2018   MCV 88.7 12/28/2018   PLT 92 (L) 12/28/2018     Radiographic Findings: Ct Chest Wo Contrast  Result Date: 03/15/2019 CLINICAL DATA:  Left tonsillar cancer. Status post chemotherapy and radiation therapy,  finished in January. Smoker. EXAM: CT CHEST WITHOUT CONTRAST TECHNIQUE: Multidetector CT imaging of the chest was performed following the standard protocol without IV contrast. COMPARISON:  PET of 01/31/2018.  No prior diagnostic chest CT. FINDINGS: Cardiovascular: Normal heart size, without pericardial effusion. Aortic atherosclerosis. Multivessel coronary artery atherosclerosis. Mediastinum/Nodes: No mediastinal or definite hilar adenopathy, given limitations of unenhanced CT. Lungs/Pleura: No pleural fluid. Moderate centrilobular and paraseptal emphysema. Calcified granuloma in the left lower lobe on image 118/7. A left upper lobe ground-glass nodule measures 5 mm on image 48/7 and is grossly similar to on the prior PET. vague right upper lobe ground-glass nodule of 10 mm on image 41/7. Upper Abdomen: Normal imaged portions of the liver, spleen, stomach, pancreas, adrenal glands, kidneys. Musculoskeletal: No acute osseous abnormality. IMPRESSION: 1. Bilateral upper lobe ground-glass nodules. Per consensus criteria, these warrant follow-up at 6-12 months. This recommendation follows the consensus statement: Guidelines for Management of Incidental Pulmonary Nodules Detected on CT Images:From the Fleischner Society 2017; published online before print (10.1148/radiol.4098119147). 2. Aortic atherosclerosis (ICD10-I70.0) and emphysema (ICD10-J43.9). 3. Age advanced coronary artery atherosclerosis. Recommend assessment of coronary risk factors and consideration of medical therapy. Electronically Signed   By: Abigail Miyamoto M.D.   On: 03/15/2019 11:23    Impression/Plan:  Today, I talked to the patient about the CT findings; reassurance given, and I told him it is prudent to repeat chest CT scan  in a year to verify stability.  I will see him in a year after repeat CT chest.  He will continue to follow closely w/ Dr Maylon Peppers.   The patient was encouraged to ask questions that I answered to the best of my ability.  He  knows to call with any issues until I see him again.   This encounter was provided by telemedicine platform telephone  as he cannot acces WebEx. The patient has given verbal consent for this type of encounter and has been advised to only accept a meeting of this type in a secure network environment. The time spent during this encounter was 10 minutes. The attendants for this meeting include Eppie Gibson  and [name].  During the encounter, Eppie Gibson was located at [facility] Radiation Oncology Department.  [name] was located at home.    _____________________________________   Eppie Gibson, MD

## 2019-04-05 ENCOUNTER — Telehealth: Payer: Self-pay | Admitting: *Deleted

## 2019-04-05 ENCOUNTER — Inpatient Hospital Stay: Payer: Medicaid Other

## 2019-04-05 ENCOUNTER — Inpatient Hospital Stay: Payer: Medicaid Other | Attending: Hematology | Admitting: Hematology

## 2019-04-05 NOTE — Telephone Encounter (Signed)
Pt was  NO  SHOW  Today.  Called pt and left message on voice mail requesting a call back to nurse for reschedule appt.

## 2020-03-12 ENCOUNTER — Telehealth: Payer: Self-pay | Admitting: *Deleted

## 2020-03-12 NOTE — Telephone Encounter (Signed)
CALLED PATIENT'S SISTER- BRENDA YOUNG, AND LVM TO CANCEL FU ON 03-13-20, DUE TO PATIENT NOT HAVING SCAN, INFORMED HER THAT SCAN IS PENDING AND WHEN IT IS PRE-CERTED IT WILL BE SCHEDULED AND THE FU WILL BE RESCHEDULED AFTER SCAN, LVM FOR A RETURN CALL

## 2020-03-13 ENCOUNTER — Telehealth: Payer: Self-pay | Admitting: *Deleted

## 2020-03-13 ENCOUNTER — Ambulatory Visit: Payer: Self-pay | Admitting: Radiation Oncology

## 2020-03-13 NOTE — Telephone Encounter (Signed)
CALLED PATIENT'S SISTER- [name] YOUNG TO INFORM OF CT FOR 03-19-20- ARRIVAL TIME- 4:15 PM @ [facility], NO RESTRICTIONS TO TEST, TEST TO BE @ [facility], PATIENT TO RECEIVE RESULTS ON 03-20-20 @ 11:20 AM, LVM FOR A RETURN CALL

## 2020-03-19 ENCOUNTER — Other Ambulatory Visit: Payer: Self-pay

## 2020-03-19 ENCOUNTER — Ambulatory Visit (HOSPITAL_COMMUNITY)
Admission: RE | Admit: 2020-03-19 | Discharge: 2020-03-19 | Disposition: A | Discharge status: Home / Self Care | Payer: Medicaid Other | Source: Ambulatory Visit | Attending: Radiation Oncology | Admitting: Radiation Oncology

## 2020-03-19 DIAGNOSIS — R911 Solitary pulmonary nodule: Secondary | ICD-10-CM | POA: Diagnosis not present

## 2020-03-19 NOTE — Progress Notes (Signed)
Patient presents today for follow up after completing radiation to the left tonsil and bilateral neck on 10/25/2017, and to receive CT scan results from 03/19/2020  Pain issues, if any: Patient denies Using a feeding tube?: N/A Weight changes, if any:  Wt Readings from Last 3 Encounters:  03/20/20 122 lb (55.3 kg)  12/28/18 124 lb 9.6 oz (56.5 kg)  07/10/18 130 lb (59 kg)   Swallowing issues, if any: Patient reports difficulty with dry food. He either needs sauce/gravy to comfortably get food down. Patient denies any issues with thin liquids. Smoking or chewing tobacco? Patient continues to smoke about 10 cigarettes a day as well as drink alcohol Using fluoride trays daily? N/A Last ENT visit was on: Patient states his follow up got canceled due to pandemic, and has not been rescheduled. Other notable issues, if any: Has not had F/U with medical oncologist since 12/28/2018. Patient reports an episode of blacking out about 2 weeks while walking to the corner store. He denies any alcohol consumption at the time of the incident.   Vitals:   03/20/20 1113  BP: 110/79  Pulse: 87  Resp: 20  Temp: 98.4 F (36.9 C)  SpO2: 99%

## 2020-03-20 ENCOUNTER — Other Ambulatory Visit: Payer: Self-pay

## 2020-03-20 ENCOUNTER — Encounter: Payer: Self-pay | Admitting: Radiation Oncology

## 2020-03-20 ENCOUNTER — Ambulatory Visit
Admission: RE | Admit: 2020-03-20 | Discharge: 2020-03-20 | Disposition: A | Discharge status: Home / Self Care | Payer: Medicaid Other | Source: Ambulatory Visit | Attending: Radiation Oncology | Admitting: Radiation Oncology

## 2020-03-20 VITALS — BP 110/79 | HR 87 | Temp 98.4°F | Resp 20 | Ht 67.0 in | Wt 122.0 lb

## 2020-03-20 DIAGNOSIS — E43 Unspecified severe protein-calorie malnutrition: Secondary | ICD-10-CM

## 2020-03-20 DIAGNOSIS — Z85818 Personal history of malignant neoplasm of other sites of lip, oral cavity, and pharynx: Secondary | ICD-10-CM | POA: Insufficient documentation

## 2020-03-20 DIAGNOSIS — E039 Hypothyroidism, unspecified: Secondary | ICD-10-CM

## 2020-03-20 DIAGNOSIS — C090 Malignant neoplasm of tonsillar fossa: Secondary | ICD-10-CM

## 2020-03-20 MED ORDER — MULTIVITAMIN ADULT PO TABS: 1.0000 | ORAL_TABLET | Freq: Every day | ORAL | 3 refills | Status: DC

## 2020-03-20 MED ORDER — THIAMINE HCL 100 MG PO TABS: 100.0000 mg | ORAL_TABLET | Freq: Every day | ORAL | 3 refills | Status: DC

## 2020-03-20 MED ORDER — LEVOTHYROXINE SODIUM 50 MCG PO TABS: 50.0000 ug | ORAL_TABLET | Freq: Every day | ORAL | 5 refills | Status: DC

## 2020-03-20 NOTE — Progress Notes (Signed)
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 thiamine 100 MG tablet    levothyroxine (SYNTHROID) 50 MCG tablet    Multiple Vitamin (MULTIVITAMIN ADULT) TABS  2. Acquired hypothyroidism  E03.9 levothyroxine (SYNTHROID) 50 MCG tablet  3. Severe protein-calorie malnutrition (HCC)  E43 thiamine 100 MG tablet    Multiple Vitamin (MULTIVITAMIN ADULT) TABS     Cancer Staging Cancer of tonsillar fossa ([place]) Staging form: Pharynx - P16 Negative Oropharynx, AJCC 8th Edition - Clinical stage from 08/06/2017: Stage III (cT3, cN0, cM0, p16: Negative) - Signed by Eppie Gibson, MD on 11/08/2017   Left tonsil and bilateral neck directed with 70 Gy in 35 fractions on 09/01/17 to 10/25/17.  CHIEF COMPLAINT: Follow-up for throat cancer  Narrative:    Patient presents today for follow up after completing radiation to the left tonsil and bilateral neck on 10/25/2017, and to receive CT scan results from 03/19/2020  The CT scan of his chest was reviewed this morning at tumor board.  The groundglass nodules are felt to be relatively stable.  Recommendation for follow-up imaging in 2 years  The patient reports that he has not been taking his levothyroxine.  He reports social isolation and social stressors exacerbated by the pandemic.  He has been drinking alcohol to excess and smoking.     Pain issues, if any: Patient denies pain or any new oral /throat lesions or neck masses Using a feeding tube?: N/A Weight changes, if any:  Wt Readings from Last 3 Encounters:  03/20/20 122 lb (55.3 kg)  12/28/18 124 lb 9.6 oz (56.5 kg)  07/10/18 130 lb (59 kg)   Swallowing issues, if any: Patient reports difficulty  with dry food. He either needs sauce/gravy to comfortably get food down. Patient denies any issues with thin liquids.  Smoking or chewing tobacco? Patient continues to smoke about 10 cigarettes a day as well as drink alcohol   Last ENT visit was on: Patient states his follow up got canceled due to pandemic, and has not been rescheduled.  Other notable issues, if any: Has not had F/U with medical oncologist since 12/28/2018. Patient reports an episode of blacking out about 2 weeks while walking to the corner store. He denies any alcohol consumption at the time of the incident.   He has not yet received the vaccination for Covid but he is interested in doing so.  Vitals:   03/20/20 1113  BP: 110/79  Pulse: 87  Resp: 20  Temp: 98.4 F (36.9 C)  SpO2: 99%    ALLERGIES:  has No Known Allergies.  Meds: Current Outpatient Medications  Medication Sig Dispense Refill  . Phenylephrine HCl (AFRIN ALLERGY NA) Place 1 spray into the nose as needed.    Marland Kitchen acetaminophen (TYLENOL) 325 MG tablet Take 650 mg by mouth every 6 (six) hours as needed for moderate pain.     Marland Kitchen levothyroxine (SYNTHROID) 50 MCG tablet Take 1 tablet (50 mcg total) by mouth daily before breakfast. Take on empty stomach with water, at least 1 hour before other medications and before eating. 30 tablet 5  . Multiple Vitamin (MULTIVITAMIN ADULT) TABS Take 1 tablet by mouth daily.  90 tablet 3  . thiamine 100 MG tablet Take 1 tablet (100 mg total) by mouth daily. 90 tablet 3   No current facility-administered medications for this encounter.    Physical Findings: The patient is in no acute distress. Patient is alert and oriented. Wt Readings from Last 3 Encounters:  03/20/20 122 lb (55.3 kg)  12/28/18 124 lb 9.6 oz (56.5 kg)  07/10/18 130 lb (59 kg)    height is 5\' 7"  (1.702 m) and weight is 122 lb (55.3 kg). His temperature is 98.4 F (36.9 C). His blood pressure is 110/79 and his pulse is 87. His respiration is 20 and oxygen  saturation is 99%. .    General: Alert and oriented, in no acute distress; thin HEENT: Head is normocephalic. Extraocular movements are intact.  Oral cavity and oropharynx is clear.  Edentulous Neck: Neck is supple, no palpable cervical or supraclavicular lymphadenopathy. Extremities: No cyanosis or edema. Lymphatics: see Neck Exam Skin: No concerning lesions. Musculoskeletal: Ambulatory     Lab Findings: Lab Results  Component Value Date   WBC 3.4 (L) 12/28/2018   HGB 12.1 (L) 12/28/2018   HCT 37.7 (L) 12/28/2018   MCV 88.7 12/28/2018   PLT 92 (L) 12/28/2018     Radiographic Findings: CT Chest Wo Contrast  Result Date: 03/20/2020 CLINICAL DATA:  Follow-up pulmonary nodules, history of throat cancer EXAM: CT CHEST WITHOUT CONTRAST TECHNIQUE: Multidetector CT imaging of the chest was performed following the standard protocol without IV contrast. COMPARISON:  CT chest, 03/14/2019, PET-CT, 01/31/2018, 08/16/2017 FINDINGS: Cardiovascular: Scattered aortic atherosclerosis. Normal heart size. Three-vessel coronary artery calcifications. No pericardial effusion. Mediastinum/Nodes: No enlarged mediastinal, hilar, or axillary lymph nodes. Thyroid gland, trachea, and esophagus demonstrate no significant findings. Lungs/Pleura: Mild, predominantly paraseptal emphysema. No significant change in ground-glass nodules, the larger of two in the right upper lobe measuring approximately 1.2 x 0.8 cm, unchanged when measured with similar axes in comparison to prior examinations dating back to 08/16/2017 (series 5, image 54). Redemonstrated 5 mm ground-glass nodule of the anterior left upper lobe, unchanged in comparison to prior examinations dating back to 08/16/2017 (series 5, image 53). No pleural effusion or pneumothorax. Upper Abdomen: No acute abnormality. Musculoskeletal: No chest wall mass or suspicious bone lesions identified. IMPRESSION: 1. No significant change in ground-glass nodules, the larger of  two in the right upper lobe measuring approximately 1.2 x 0.8 cm, unchanged in comparison to prior examinations dating back to 08/16/2017. With approximately 3 years of stability established for this largest nodule, recommend additional follow-up in 2 years to ensure long-term stability by 2017 Fleischner Society criteria; surveillance may be accomplished in conjunction with ongoing oncologic imaging if clinically indicated given history of malignancy. 2. Emphysema (ICD10-J43.9). 3. Coronary artery disease. Aortic Atherosclerosis (ICD10-I70.0). Electronically Signed   By: Eddie Candle M.D.   On: 03/20/2020 09:24    Impression/Plan:    Is a very pleasant 59 year old gentleman with a history of head neck cancer.  He has been lost to follow-up with otolaryngology and medical oncology.  He has not been taking his levothyroxine.  Fortunately, he is without evidence of progression or recurrence according to his chest CT and physical exam today.   We discussed his alcohol abuse and tobacco abuse today.  He is motivated to quit, but I believe he will need some significant social support.  I will refer him to social work and survivorship.  I also have advised him to first call 1 800 quit now hotline to  help with smoking cessation.  Our head and neck navigator will navigate him back to otolaryngology for follow-up.  I will see him back in 1 year.  Plan for repeat chest CT in 2 years.  The patient was encouraged to ask questions that I answered to the best of my ability.  He knows to call with any issues until I see him again.  Re: survivorship consult - I will request this to take place in 6 to 8 weeks.  He has not been following with medical oncology and his medical oncologist is no longer practicing in our hospital.  TSH will be ordered to check on his levels at that time now that he is receiving levothyroxine.  Re: social work -- substance abuse, social stressors are relevant issues.  I will request this  next available.  I hope he can be connected to appropriate community resources.  As above, plan for CT chest in 2 yrs. F/u in radiation oncology in 1 yr.  Hypothyroidism: Instructed patient to resume levothyroxine.  I ordered a TSH to precede his survivorship appointment.  We discussed measures to reduce the risk of infection during the COVID-19 pandemic.  I helped the patient get scheduled for his first vaccine at [facility], where he is heading this afternoon to pick up his new prescriptions.  Regarding prescriptions, I have prescribed levothyroxine refill, multivitamin, and thiamine --these vitamins are important given his history of alcoholism.   On date of service, in total, I spent 30 minutes on this encounter.  The patient was seen in person. _____________________________________   Eppie Gibson, MD

## 2020-03-22 ENCOUNTER — Encounter: Payer: Self-pay | Admitting: Radiation Oncology

## 2020-03-22 ENCOUNTER — Other Ambulatory Visit: Payer: Self-pay | Admitting: Radiation Oncology

## 2020-03-22 ENCOUNTER — Telehealth: Payer: Self-pay | Admitting: Medical

## 2020-03-22 DIAGNOSIS — E039 Hypothyroidism, unspecified: Secondary | ICD-10-CM

## 2020-03-22 DIAGNOSIS — C090 Malignant neoplasm of tonsillar fossa: Secondary | ICD-10-CM

## 2020-03-22 DIAGNOSIS — F1910 Other psychoactive substance abuse, uncomplicated: Secondary | ICD-10-CM

## 2020-03-22 NOTE — Progress Notes (Signed)
Oncology Nurse Navigator Documentation  Per patient's 03/20/20 post-treatment follow-up with Dr. Isidore Moos, sent fax to [facility] ENT Scheduling with request  Mr. Goga be contacted and scheduled for routine post-RT follow-up in 6 months.  Notification of successful fax transmission received.   Harlow Asa RN, BSN, OCN Head & Neck Oncology Nurse [place] at [facility] Phone # [phone]  Fax # [phone]

## 2020-03-22 NOTE — Telephone Encounter (Signed)
Scheduled appt per 6/4 sch message- unable to reach pt .left message with appt date and time

## 2020-03-25 ENCOUNTER — Encounter: Payer: Self-pay | Admitting: Licensed Clinical Social Worker

## 2020-03-25 NOTE — Progress Notes (Signed)
[place] Work  Clinical Social Work was referred by Dr. Isidore Moos for assessment of psychosocial needs related to social stressors, isolation, substance abuse (alcohol & tobacco).  Clinical Social Worker attempted to contact patient by phone  to offer support and assess for needs.  No answer. Left generic VM with direct number asking patient to call back.      Verba Ainley, [place], [place] Worker [facility]

## 2020-03-26 NOTE — Progress Notes (Signed)
[facility] CLINICAL SOCIAL WORK  CSW made 2nd attempt to contact patient via phone. No answer, left VM with callback information.   Patient may be re-referred as needed in the future.   Edwinna Areola Shandrell Boda, LCSW

## 2020-05-02 ENCOUNTER — Telehealth: Payer: Self-pay | Admitting: Medical

## 2020-05-02 NOTE — Telephone Encounter (Signed)
Rescheduled appt per 7/14 sch msg - unable to reach pt . Left message with appt date and time

## 2020-05-03 ENCOUNTER — Inpatient Hospital Stay: Payer: Medicaid Other | Admitting: Medical

## 2020-05-03 ENCOUNTER — Inpatient Hospital Stay: Payer: Medicaid Other

## 2020-05-06 ENCOUNTER — Inpatient Hospital Stay: Payer: Medicaid Other | Admitting: Medical

## 2020-05-06 ENCOUNTER — Inpatient Hospital Stay: Payer: Medicaid Other | Attending: Internal Medicine

## 2021-03-24 ENCOUNTER — Telehealth: Payer: Self-pay | Admitting: *Deleted

## 2021-03-24 ENCOUNTER — Other Ambulatory Visit: Payer: Self-pay

## 2021-03-24 DIAGNOSIS — E039 Hypothyroidism, unspecified: Secondary | ICD-10-CM

## 2021-03-24 DIAGNOSIS — C090 Malignant neoplasm of tonsillar fossa: Secondary | ICD-10-CM

## 2021-03-24 NOTE — Telephone Encounter (Signed)
CALLED PATIENT'S SISTER- [name] YOUNG TO INFORM OF LAB APPT. @ 1:30 PM TOMORROW PRIOR TO 2 PM FU WITH DR. Isidore Moos, SPOKE WITH PATIENT'S SISTER- [name] YOUNG AND SHE IS AWARE OF THESE APPTS.

## 2021-03-25 ENCOUNTER — Ambulatory Visit
Admission: RE | Admit: 2021-03-25 | Discharge: 2021-03-25 | Disposition: A | Discharge status: Home / Self Care | Payer: Medicaid Other | Source: Ambulatory Visit | Attending: Radiation Oncology | Admitting: Radiation Oncology

## 2021-03-25 ENCOUNTER — Other Ambulatory Visit: Payer: Self-pay

## 2021-03-25 VITALS — BP 129/80 | HR 79 | Temp 97.6°F | Resp 20 | Ht 67.0 in | Wt 123.2 lb

## 2021-03-25 DIAGNOSIS — C090 Malignant neoplasm of tonsillar fossa: Secondary | ICD-10-CM | POA: Diagnosis not present

## 2021-03-25 DIAGNOSIS — F17210 Nicotine dependence, cigarettes, uncomplicated: Secondary | ICD-10-CM | POA: Insufficient documentation

## 2021-03-25 DIAGNOSIS — R221 Localized swelling, mass and lump, neck: Secondary | ICD-10-CM | POA: Insufficient documentation

## 2021-03-25 DIAGNOSIS — Z79899 Other long term (current) drug therapy: Secondary | ICD-10-CM | POA: Insufficient documentation

## 2021-03-25 DIAGNOSIS — E43 Unspecified severe protein-calorie malnutrition: Secondary | ICD-10-CM

## 2021-03-25 DIAGNOSIS — E039 Hypothyroidism, unspecified: Secondary | ICD-10-CM

## 2021-03-25 DIAGNOSIS — Z923 Personal history of irradiation: Secondary | ICD-10-CM | POA: Diagnosis not present

## 2021-03-25 DIAGNOSIS — J359 Chronic disease of tonsils and adenoids, unspecified: Secondary | ICD-10-CM

## 2021-03-25 LAB — TSH: TSH: 43.868 u[IU]/mL — ABNORMAL HIGH (ref 0.320–4.118)

## 2021-03-25 NOTE — Progress Notes (Signed)
Patient presents today for follow up after completing radiation to the left tonsil and bilateral neck on 10/25/2017  Pain issues, if any: Denies any neck or throat pain, but does report right shoulder pain. Denies being evaluated by an orthopedic provider Using a feeding tube?: N/A Weight changes, if any:  Wt Readings from Last 3 Encounters:  03/25/21 123 lb 3.2 oz (55.9 kg)  03/20/20 122 lb (55.3 kg)  12/28/18 124 lb 9.6 oz (56.5 kg)   Swallowing issues, if any:  Only with drier foods. States that as long as his meals have a sauce/gravy, or he has something to drink, he doesn't have any issues Smoking or chewing tobacco? Current smoker--states he smokes about 5 cigarettes a day Using fluoride trays daily? N/A--full set of dentures Last ENT visit was on: Not since pandemic Other notable issues, if any: Denies any ear or jaw pain, or difficulty opening his mouth. Denies any lingering dry mouth or thick saliva. Denies feeling fatigue or difficulty sleeping at night.   Vitals:   03/25/21 1350  BP: 129/80  Pulse: 79  Resp: 20  Temp: 97.6 F (36.4 C)  SpO2: 100%

## 2021-03-26 ENCOUNTER — Other Ambulatory Visit: Payer: Self-pay | Admitting: Radiation Oncology

## 2021-03-26 ENCOUNTER — Telehealth: Payer: Self-pay

## 2021-03-26 ENCOUNTER — Encounter: Payer: Self-pay | Admitting: Radiation Oncology

## 2021-03-26 DIAGNOSIS — C090 Malignant neoplasm of tonsillar fossa: Secondary | ICD-10-CM

## 2021-03-26 DIAGNOSIS — E43 Unspecified severe protein-calorie malnutrition: Secondary | ICD-10-CM

## 2021-03-26 DIAGNOSIS — E039 Hypothyroidism, unspecified: Secondary | ICD-10-CM

## 2021-03-26 MED ORDER — THIAMINE HCL 100 MG PO TABS: 100.0000 mg | ORAL_TABLET | Freq: Every day | ORAL | 3 refills | Status: DC

## 2021-03-26 MED ORDER — LEVOTHYROXINE SODIUM 50 MCG PO TABS: ORAL_TABLET | ORAL | 4 refills | Status: DC

## 2021-03-26 MED ORDER — MULTIVITAMIN ADULT PO TABS: 1.0000 | ORAL_TABLET | Freq: Every day | ORAL | 3 refills | Status: DC

## 2021-03-26 NOTE — Telephone Encounter (Signed)
Called patient to relay Dr. Pearlie Oyster message and reinforce medication instructions. Did not reach patient, so detailed message left on VM. Provided my direct call back number should patient have any questions/concerns regarding prescription.

## 2021-03-26 NOTE — Telephone Encounter (Signed)
-----   Message from Eppie Gibson, MD sent at 03/26/2021  3:52 PM EDT ----- Althia Forts, please call pt and tell him I refilled his levothyroxine 50 mcg to his [facility].   Reiterate, please, he must take it daily - 1 tab QAM an hour before eating or other meds or coffee.  Please also arrange repeat TSH here in mid August (no f/u needed then).  Thanks! SS

## 2021-03-26 NOTE — Progress Notes (Signed)
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      Cancer Staging Cancer of tonsillar fossa ([place]) Staging form: Pharynx - P16 Negative Oropharynx, AJCC 8th Edition - Clinical stage from 08/06/2017: Stage III (cT3, cN0, cM0, p16: Negative) - Signed by Eppie Gibson, MD on 11/08/2017 Histologic grade (G): GX Histologic grading system: 4 grade system Laterality: Left Lymph-vascular invasion (LVI): Presence of LVI unknown/indeterminate Presence of extranodal extension: Unknown ECOG performance status: Grade 1 Karnofsky performance status: Score 80 Perineural invasion (PNI): Unknown Stage used in treatment planning: Yes National guidelines used in treatment planning: Yes Type of national guideline used in treatment planning: NCCN   Left tonsil and bilateral neck directed with 70 Gy in 35 fractions on 09/01/17 to 10/25/17.  CHIEF COMPLAINT: Follow-up for throat cancer  Narrative:      Patient presents today for follow up after completing radiation to the left tonsil and bilateral neck on 10/25/2017  Pain issues, if any: Denies any neck or throat pain   Using a feeding tube?: N/A Weight changes, if any:  Wt Readings from Last 3 Encounters:  03/25/21 123 lb 3.2 oz (55.9 kg)  03/20/20 122 lb (55.3 kg)  12/28/18 124 lb 9.6 oz (56.5 kg)   Swallowing issues, if any:  Only with drier foods. States that as long as his meals have a sauce/gravy, or he has something to drink, he doesn't have any issues Smoking or chewing tobacco? Current smoker--states he smokes about 5 cigarettes a day Using fluoride trays daily? N/A--full set of dentures Last ENT visit was on: Not since pandemic Other  notable issues, if any: Denies any ear or jaw pain, or difficulty opening his mouth. Denies any lingering dry mouth or thick saliva. Denies feeling fatigue or difficulty sleeping at night.   He did not reply to calls from survivorship.  He has not been taking his levothyroxine.  Vitals:   03/25/21 1350  BP: 129/80  Pulse: 79  Resp: 20  Temp: 97.6 F (36.4 C)  SpO2: 100%       ALLERGIES:  has No Known Allergies.  Meds: Current Outpatient Medications  Medication Sig Dispense Refill  . acetaminophen (TYLENOL) 325 MG tablet Take 650 mg by mouth every 6 (six) hours as needed for moderate pain.     . cetirizine (ZYRTEC) 10 MG tablet Take 1 tablet by mouth daily as needed.    Marland Kitchen levothyroxine (SYNTHROID) 50 MCG tablet Take 1 tablet every morning on empty stomach with water, at least 1 hour before other medications and before eating. 90 tablet 4  . Multiple Vitamin (MULTIVITAMIN ADULT) TABS Take 1 tablet by mouth daily. 90 tablet 3  . thiamine 100 MG tablet Take 1 tablet (100 mg total) by mouth daily. 90 tablet 3  . Vitamin D, Ergocalciferol, (DRISDOL) 1.25 MG (50000 UNIT) CAPS capsule Take 1 capsule by mouth once a week.     No current facility-administered medications for this encounter.    Physical Findings: The patient is in no acute distress. Patient is alert and oriented. Wt Readings from Last 3 Encounters:  03/25/21 123 lb 3.2 oz (55.9 kg)  03/20/20 122 lb (55.3 kg)  12/28/18 124 lb 9.6 oz (56.5 kg)    height is 5\' 7"  (1.702 m) and weight is 123 lb 3.2 oz (55.9 kg). His temperature is 97.6 F (36.4 C). His blood pressure is 129/80 and his pulse is 79. His respiration is 20 and oxygen saturation is 100%. .    General: Alert and oriented, in no acute distress; thin HEENT: Head is normocephalic. Extraocular movements are intact.  Oral cavity and oropharynx is clear with paucity of tissue in the left tonsillar and soft palate region.  Edentulous -dentures removed. Neck: Right  submandibular mass - likely the gland - is firm and slightly enlarged.  Otherwise neck is supple, no palpable cervical or supraclavicular lymphadenopathy. Extremities: No cyanosis or edema. Lymphatics: see Neck Exam Skin: No concerning lesions. Musculoskeletal: Ambulatory   Heart regular in rate and rhythm Chest clear to auscultation bilaterally   Lab Findings: Lab Results  Component Value Date   WBC 3.4 (L) 12/28/2018   HGB 12.1 (L) 12/28/2018   HCT 37.7 (L) 12/28/2018   MCV 88.7 12/28/2018   PLT 92 (L) 12/28/2018   Lab Results  Component Value Date   TSH 43.868 (H) 03/25/2021     Radiographic Findings: No results found.  Impression/Plan:    Is a very pleasant 60 year old gentleman with a history of head neck cancer.  He has been lost to follow-up with medical and otolaryngology.  He reports that he does not wish to see medical oncology or survivorship.  I do not believe he will attend otolaryngology if we continue to refer him back. .He has not followed through with taking his levothyroxine and he does continue to smoke.  Today he has a nonspecific degree of firmness and swelling in the right submandibular region.  Out of caution we will obtain a CT scan of his neck and chest with contrast.  He does have some nonspecific findings in a CT of his chest from last year that we will follow-up on; a CT scan of his neck will examine the right submandibular region which is asymmetric today.  Again I encouraged him to stop smoking but he does not seem motivated to quit.  We will resume his levothyroxine and schedule him for retesting of his thyroid function in August.  He was again instructed on how to take this medication.  I will refill his thiamine and multivitamin.   We will call him with the results to lab work and studies in the near future and otherwise I will see him back in person in 1 year.  On date of service, in total, I spent 35 minutes on this encounter.  The patient  was seen in person. _____________________________________   Eppie Gibson, MD

## 2021-04-23 ENCOUNTER — Other Ambulatory Visit: Payer: Self-pay

## 2021-04-23 ENCOUNTER — Telehealth: Payer: Self-pay | Admitting: *Deleted

## 2021-04-23 DIAGNOSIS — C090 Malignant neoplasm of tonsillar fossa: Secondary | ICD-10-CM

## 2021-04-23 NOTE — Telephone Encounter (Signed)
CALLED PATIENT TO INFORM OF STAT LABS ON 05-02-21 @ 1 PM @ [place] AND HIS CT TO FOLLOW- ARRIVAL TIME- 1:45 PM @ [facility], CHEST X-RAY TO BE DONE FIRST THEN CT TO FOLLOW, PATIENT TO HAVE WATER ONLY - 4 HRS. PRIOR TO TEST , DR. Isidore Moos TO CALL PATIENT WITH THE RESULTS, LVM FOR A RETURN CALL

## 2021-04-25 ENCOUNTER — Other Ambulatory Visit (HOSPITAL_COMMUNITY): Payer: Medicaid Other

## 2021-05-02 ENCOUNTER — Ambulatory Visit (HOSPITAL_COMMUNITY)
Admission: RE | Admit: 2021-05-02 | Discharge: 2021-05-02 | Disposition: A | Discharge status: Home / Self Care | Payer: Medicaid Other | Source: Ambulatory Visit | Attending: Radiation Oncology | Admitting: Radiation Oncology

## 2021-05-02 ENCOUNTER — Other Ambulatory Visit: Payer: Self-pay

## 2021-05-02 ENCOUNTER — Encounter (HOSPITAL_COMMUNITY): Payer: Self-pay

## 2021-05-02 ENCOUNTER — Ambulatory Visit
Admission: RE | Admit: 2021-05-02 | Discharge: 2021-05-02 | Disposition: A | Discharge status: Home / Self Care | Payer: Medicaid Other | Source: Ambulatory Visit | Attending: Radiation Oncology | Admitting: Radiation Oncology

## 2021-05-02 DIAGNOSIS — C090 Malignant neoplasm of tonsillar fossa: Secondary | ICD-10-CM | POA: Diagnosis present

## 2021-05-02 DIAGNOSIS — J359 Chronic disease of tonsils and adenoids, unspecified: Secondary | ICD-10-CM

## 2021-05-02 LAB — BUN & CREATININE (CHCC)
BUN: 11 mg/dL (ref 6–20)
Creatinine: 0.76 mg/dL (ref 0.61–1.24)
GFR, Estimated: >60 mL/min (ref >60)

## 2021-05-02 MED ORDER — IOHEXOL 350 MG/ML SOLN
75.0000 mL | Freq: Once | INTRAVENOUS | Status: AC | PRN
  Administered 2021-05-02: 75 mL via INTRAVENOUS

## 2021-05-02 MED ORDER — SODIUM CHLORIDE (PF) 0.9 % IJ SOLN
INTRAMUSCULAR | Status: AC
  Filled 2021-05-02: qty 50

## 2021-05-05 ENCOUNTER — Encounter: Payer: Self-pay | Admitting: Radiation Oncology

## 2021-05-05 ENCOUNTER — Other Ambulatory Visit: Payer: Self-pay

## 2021-05-05 NOTE — Progress Notes (Signed)
I called Mr. Bur to let him know about his scan results.  He understands that there is a growing opacity in his chest that may be a slow-growing cancer unrelated to his throat cancer.  He will be discussed at thoracic conference and we will let him know if a biopsy is recommended.  The area of firmness in the right submandibular gland may be related to sialadenitis.  He denies any tenderness there. Will refer to ENT if the team recommends at ENT conference. -----------------------------------  Eppie Gibson, MD

## 2021-05-08 ENCOUNTER — Other Ambulatory Visit: Payer: Self-pay | Admitting: *Deleted

## 2021-05-08 NOTE — Progress Notes (Signed)
The proposed treatment discussed in cancer conference is for discussion purpose only and is not a binding recommendation. The patient was not physically examined nor present for their treatment options. Therefore, final treatment plans cannot be decided.  ?

## 2021-05-09 ENCOUNTER — Encounter: Payer: Self-pay | Admitting: *Deleted

## 2021-05-09 DIAGNOSIS — R911 Solitary pulmonary nodule: Secondary | ICD-10-CM

## 2021-06-04 ENCOUNTER — Ambulatory Visit: Payer: Medicaid Other | Attending: Radiation Oncology

## 2021-06-04 DIAGNOSIS — C090 Malignant neoplasm of tonsillar fossa: Secondary | ICD-10-CM | POA: Insufficient documentation

## 2021-06-12 ENCOUNTER — Institutional Professional Consult (permissible substitution): Payer: Medicaid Other | Admitting: Emergency Medicine

## 2021-07-03 ENCOUNTER — Encounter: Payer: Self-pay | Admitting: Gastroenterology

## 2021-09-05 ENCOUNTER — Encounter: Payer: Self-pay | Admitting: Physician Assistant

## 2021-09-05 ENCOUNTER — Ambulatory Visit: Payer: Medicaid Other | Admitting: Physician Assistant

## 2021-09-05 ENCOUNTER — Other Ambulatory Visit (INDEPENDENT_AMBULATORY_CARE_PROVIDER_SITE_OTHER): Payer: Medicaid Other

## 2021-09-05 VITALS — BP 98/60 | HR 84 | Ht 68.0 in | Wt 120.0 lb

## 2021-09-05 DIAGNOSIS — Z85818 Personal history of malignant neoplasm of other sites of lip, oral cavity, and pharynx: Secondary | ICD-10-CM

## 2021-09-05 DIAGNOSIS — F1010 Alcohol abuse, uncomplicated: Secondary | ICD-10-CM

## 2021-09-05 DIAGNOSIS — Z86010 Personal history of colonic polyps: Secondary | ICD-10-CM

## 2021-09-05 LAB — COMPREHENSIVE METABOLIC PANEL
ALT: 15 U/L (ref 0–53)
AST: 40 U/L — ABNORMAL HIGH (ref 0–37)
Albumin: 4.7 g/dL (ref 3.5–5.2)
Alkaline Phosphatase: 54 U/L (ref 39–117)
BUN: 8 mg/dL (ref 6–23)
CO2: 30 mEq/L (ref 19–32)
Calcium: 9.8 mg/dL (ref 8.4–10.5)
Chloride: 99 mEq/L (ref 96–112)
Creatinine, Ser: 0.74 mg/dL (ref 0.40–1.50)
GFR: 99.47 mL/min (ref >60.00)
Glucose, Bld: 94 mg/dL (ref 70–99)
Potassium: 4.1 mEq/L (ref 3.5–5.1)
Sodium: 138 mEq/L (ref 135–145)
Total Bilirubin: 0.5 mg/dL (ref 0.2–1.2)
Total Protein: 7.6 g/dL (ref 6.0–8.3)

## 2021-09-05 LAB — CBC WITH DIFFERENTIAL/PLATELET
Basophils Absolute: 0 10*3/uL (ref 0.0–0.1)
Basophils Relative: 1.3 % (ref 0.0–3.0)
Eosinophils Absolute: 0 10*3/uL (ref 0.0–0.7)
Eosinophils Relative: 0.8 % (ref 0.0–5.0)
HCT: 36.7 % — ABNORMAL LOW (ref 39.0–52.0)
Hemoglobin: 11.8 g/dL — ABNORMAL LOW (ref 13.0–17.0)
Lymphocytes Relative: 25.1 % (ref 12.0–46.0)
Lymphs Abs: 0.6 10*3/uL — ABNORMAL LOW (ref 0.7–4.0)
MCHC: 32.3 g/dL (ref 30.0–36.0)
MCV: 91.1 fl (ref 78.0–100.0)
Monocytes Absolute: 0.5 10*3/uL (ref 0.1–1.0)
Monocytes Relative: 19.9 % — ABNORMAL HIGH (ref 3.0–12.0)
Neutro Abs: 1.3 10*3/uL — ABNORMAL LOW (ref 1.4–7.7)
Neutrophils Relative %: 52.9 % (ref 43.0–77.0)
Platelets: 94 10*3/uL — ABNORMAL LOW (ref 150.0–400.0)
RBC: 4.02 Mil/uL — ABNORMAL LOW (ref 4.22–5.81)
RDW: 15.8 % — ABNORMAL HIGH (ref 11.5–15.5)
WBC: 2.5 10*3/uL — ABNORMAL LOW (ref 4.0–10.5)

## 2021-09-05 LAB — PROTIME-INR
INR: 1 ratio (ref 0.8–1.0)
Prothrombin Time: 11 s (ref 9.6–13.1)

## 2021-09-05 MED ORDER — PLENVU 140 G PO SOLR: 1.0000 | ORAL | 0 refills | Status: DC

## 2021-09-05 NOTE — Patient Instructions (Signed)
If you are age 59 or younger, your body mass index should be between 19-25. Your Body mass index is 18.25 kg/m. If this is out of the aformentioned range listed, please consider follow up with your Primary Care Provider.  ________________________________________________________  The [facility] providers would like to encourage you to use [facility] to communicate with providers for non-urgent requests or questions.  Due to long hold times on the telephone, sending your provider a message by [facility] may be a faster and more efficient way to get a response.  Please allow 48 business hours for a response.  Please remember that this is for non-urgent requests.  _______________________________________________________  [name] have been scheduled for a colonoscopy. Please follow written instructions given to you at your visit today.  Please pick up your prep supplies at the pharmacy within the next 1-3 days. If you use inhalers (even only as needed), please bring them with you on the day of your procedure.  Your provider has requested that you go to the basement level for lab work before leaving today. Press "B" on the elevator. The lab is located at the first door on the left as you exit the elevator.  Follow up pending.  Thank you for entrusting me with your care and choosing [facility].  Amy Esterwood, PA-C

## 2021-09-05 NOTE — Progress Notes (Signed)
Agree with assessment and plan as outlined.  

## 2021-09-05 NOTE — Progress Notes (Signed)
Subjective:    Patient ID: [name], male    DOB: 06/21/1961, 60 y.o.   MRN: [id number]  HPI Yazid is a pleasant 60 year old African-American male, established with Dr. Havery Moros.  He comes in today to discuss follow-up colonoscopy and also relates intermittent blood in his stool. He last had colonoscopy in May 2019, he had 6 polyps removed the largest was 4 mm and all were tubular adenomas, also noted to have internal hemorrhoids.  Patient says that he has seen small amounts of bright red blood on the tissue and in the commode intermittently for a long time.  This may be exacerbated by straining.  He says he has flares of these bleeding episodes off and on.  No associated anorectal pain or discomfort, no complaints of abdominal pain.  No complaints of constipation.  He does have MiraLAX at home which he uses as needed. He admits to drinking alcohol chronically on a daily basis usually about 2 pints, and a couple of beers.  His sister expresses concern about his ongoing alcohol use.  Patient says he is gone through [place] in the past. 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  He did have a PEG tube during that time which has since been removed. He is followed by Dr. Ivor Reining oncology.  He says that he does not have any difficulty swallowing, except for very large pieces and also feels that part of this is because he has dentures. Last surveillance chest CT July 2022 showed a dominant groundglass density in the right upper lobe, concerning for a slow-growing adenocarcinoma.  Also noted to have groundglass appearance in the left upper lobe felt to be stable.  Notes relate this was to be discussed at tumor board.  Patient says he thinks he supposed to follow-up with Dr. Lanell Persons in January but does not have an appointment..  Review of Systems Pertinent positive and negative review of systems were  noted in the above HPI section.  All other review of systems was otherwise negative.   Outpatient Encounter Medications as of 09/05/2021  Medication Sig   PEG-KCl-NaCl-NaSulf-Na Asc-C (PLENVU) 140 g SOLR Take 1 kit by mouth as directed.   [DISCONTINUED] acetaminophen (TYLENOL) 325 MG tablet Take 650 mg by mouth every 6 (six) hours as needed for moderate pain.    [DISCONTINUED] cetirizine (ZYRTEC) 10 MG tablet Take 1 tablet by mouth daily as needed.   [DISCONTINUED] levothyroxine (SYNTHROID) 50 MCG tablet Take 1 tablet every morning on empty stomach with water, at least 1 hour before other medications and before eating.   [DISCONTINUED] Multiple Vitamin (MULTIVITAMIN ADULT) TABS Take 1 tablet by mouth daily. Take in the evening.   [DISCONTINUED] thiamine 100 MG tablet Take 1 tablet (100 mg total) by mouth daily. Take in the evening.   [DISCONTINUED] Vitamin D, Ergocalciferol, (DRISDOL) 1.25 MG (50000 UNIT) CAPS capsule Take 1 capsule by mouth once a week.   No facility-administered encounter medications on file as of 09/05/2021.   No Known Allergies Patient Active Problem List   Diagnosis Date Noted   Alcohol abuse 09/05/2021   History of colonic polyps 09/05/2021   Thrombocytopenia ([place]) 12/28/2018   Fever 10/22/2017   Leukopenia 10/22/2017   Thrush 10/22/2017   Severe protein-calorie malnutrition ([place]) 10/08/2017   Hyponatremia 10/07/2017   Anemia 10/07/2017   Neutropenic fever ([place]) 10/06/2017   Pancytopenia, acquired ([place]) 10/01/2017   Weight loss 10/01/2017  Alcohol withdrawal syndrome without complication ([place]) 25/63/8937   Cancer of tonsillar fossa ([place]) 08/03/2017   Social History   Socioeconomic History   Marital status: Single    Spouse name: Not on file   Number of children: 1   Years of education: Not on file   Highest education level: Not on file  Occupational History   Occupation: Disabled  Tobacco Use   Smoking status: Light Smoker    Packs/day: 1.00     Years: 30.00    Pack years: 30.00    Types: Cigarettes   Smokeless tobacco: Never   Tobacco comments:    Tobacco info given  Vaping Use   Vaping Use: Never used  Substance and Sexual Activity   Alcohol use: Yes    Alcohol/week: 7.0 standard drinks    Types: 7 Cans of beer per week    Comment: 1 pint daily or more   Drug use: No    Types: Cocaine    Comment: he has a past history of cocaine abuse, he denies current use   Sexual activity: Not on file  Other Topics Concern   Not on file  Social History Narrative   Not on file   Social Determinants of Health   Financial Resource Strain: Not on file  Food Insecurity: Not on file  Transportation Needs: Not on file  Physical Activity: Not on file  Stress: Not on file  Social Connections: Not on file  Intimate Partner Violence: Not on file    Mr. Reh family history includes Other in his mother.      Objective:    Vitals:   09/05/21 1123  BP: 98/60  Pulse: 84    Physical Exam Well-developed well-nourished thin older African-American male in no acute distress.  Accompanied by his sister height, Weight, 120 BMI 18.25  HEENT; nontraumatic normocephalic, EOMI, PE R LA, sclera anicteric. Oropharynx; no lesions appreciated Neck; supple, no JVD Cardiovascular; regular rate and rhythm with S1-S2, no murmur rub or gallop Pulmonary; Clear bilaterally Abdomen; soft, nontender, nondistended, no palpable mass or hepatosplenomegaly, bowel sounds are active, PEG tube incisional scar Rectal; not done today Skin; benign exam, no jaundice rash or appreciable lesions Extremities; no clubbing cyanosis or edema skin warm and dry Neuro/Psych; alert and oriented x4, grossly nonfocal mood and affect appropriate        Assessment & Plan:   #56 60 year old African-American male with history of multiple adenomatous colon polyps, due for 3-year interval surveillance colonoscopy. Last exam May 2019 with removal of 6 polyps, largest 4 mm  and all were tubular adenomas.  Also noted to have internal hemorrhoids.  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 Havery Moros.  Procedure was discussed in detail with the patient including indications risk and benefits and he is agreeable to proceed  CBC, c-Met, pro time/INR today  I will contact Dr. Marlane Hatcher oncology regarding follow-up of abnormal chest CT.  (Dr. Wess Botts procedure was done in the [facility], after  initial diagnosis of tonsillar fossa cancer)   Mavric Cortright S Ladarian Bonczek PA-C 09/05/2021   Cc: Nolene Ebbs, MD

## 2021-09-09 ENCOUNTER — Other Ambulatory Visit: Payer: Self-pay

## 2021-09-09 ENCOUNTER — Encounter: Payer: Self-pay | Admitting: Hematology and Oncology

## 2021-09-09 ENCOUNTER — Telehealth: Payer: Self-pay | Admitting: Pulmonary Disease

## 2021-09-09 DIAGNOSIS — R7401 Elevation of levels of liver transaminase levels: Secondary | ICD-10-CM

## 2021-09-09 DIAGNOSIS — K838 Other specified diseases of biliary tract: Secondary | ICD-10-CM

## 2021-09-09 DIAGNOSIS — C090 Malignant neoplasm of tonsillar fossa: Secondary | ICD-10-CM

## 2021-09-09 DIAGNOSIS — F1010 Alcohol abuse, uncomplicated: Secondary | ICD-10-CM

## 2021-09-09 DIAGNOSIS — D696 Thrombocytopenia, unspecified: Secondary | ICD-10-CM

## 2021-09-09 NOTE — Telephone Encounter (Signed)
I have ordered the CT scan and placed the recall. Nothing further needed.

## 2021-09-09 NOTE — Telephone Encounter (Signed)
----- Message from Garner Nash, DO sent at 09/09/2021 12:10 PM EST ----- Regarding: RE: follow up  Triage   Can you please schedule Super D CT Chest in Jan 2023? And scheduled follow up with me after CT is complete.  Thanks  BLI    ----- Message ----- From: Eppie Gibson, MD Sent: 09/08/2021   1:55 PM EST To: Valrie Hart, RN, Ernst Spell, RN, # Subject: RE: follow up                                  Can Mel Almond order the CT chest so it's coordinated w/ your followup appt, Brad? Anderson Malta, can you call Mr. Koppel to update him on the plan? Thanks!  ----- Message ----- From: Garner Nash, DO Sent: 09/08/2021  12:53 PM EST To: Eppie Gibson, MD, Valrie Hart, RN, # Subject: RE: follow up                                  Absolutely. Happy to see him.  Let me see him after his repeat ct chest in Jan 2023 Thanks Barbaraann Barthel, can you please schedule with me in Jan 2023 nodule slot after his CT chest is complete.  Thank BLI    ----- Message ----- From: Eppie Gibson, MD Sent: 09/08/2021  10:25 AM EST To: Valrie Hart, RN, Ernst Spell, RN, # Subject: RE: follow up                                  Leroy Sea, I don't believe this patient even made it to pulmonology.  He's a head and neck cancer survivor who had nonspecific findings in a chest CT in July, possible low grade primary adenocarcinoma.    Recommendations at [facility] were to refer to pulmonary, repeat scan in 6 months, obtain PFT's, then possible bx.  Assuming he was never seen by your group, would you like to see him now, or in January (6 mo after previous scan) with new Chest CT and PFTs at that time?  Please reply to all so that one of our navigators can arrange orders. Thanks, Sarah  ----- Message ----- ----- Message ----- From: Valrie Hart, RN Sent: 09/08/2021   8:11 AM EST To: Alfredia Ferguson, PA-C, Eppie Gibson, MD, # Subject: RE: follow up                                  Hi  All- Yes, it is documented on 7/21.  Recommendations at that time are refer to pulmonary, repeat scan in 6 months, obtain PFT's, then possible bx. Thanks Hinton Dyer  ----- Message ----- From: Eppie Gibson, MD Sent: 09/08/2021   7:45 AM EST To: Alfredia Ferguson, PA-C, Eppie Gibson, MD, # Subject: RE: follow up                                  Thanks, Lauris Poag, do you have notes from when he was presented at [facility] clinic in July?  I think his disposition is unclear re: his chest CT and that will drive f/u plans. Thanks  for digging a bit and letting us know!  Sarah  ----- Message ----- From: Leotis Pain Sent: 09/05/2021   1:57 PM EST To: Eppie Gibson, MD Subject: follow up                                      Dr. Isidore Moos, I saw this patient in the GI clinic today/[place]-he is to have follow-up colonoscopy. 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 I cannot see results of that discussion, just wanting to make sure the patient gets scheduled for follow-up  and imaging if needed.  Patient related to me that he thought he was supposed to see you, in January but does not have appointment scheduled.. Thank you

## 2021-09-15 NOTE — Telephone Encounter (Signed)
Contacted patient to schedule an appointment with Dr. Valeta Harms per request after a repeat CT in January 2023. Patient requested we call his sister, Mariana Kaufman to discuss appointments. Left voicemail on [name]'s phone to call back to discuss schedule an appointment for patient.

## 2021-09-16 NOTE — Telephone Encounter (Signed)
Patient is scheduled with Dr. Valeta Harms on 11/13/2020 at 130p. Routing to PCC's to get CT scheduled before this date.

## 2021-09-16 NOTE — Telephone Encounter (Signed)
I had pt's order in my January CT's.  I got CT scheduled on 1/19 and called sister Rise Paganini per phone note request and gave her info.  Nothing further needed.

## 2021-09-17 ENCOUNTER — Ambulatory Visit (HOSPITAL_COMMUNITY)
Admission: RE | Admit: 2021-09-17 | Discharge: 2021-09-17 | Disposition: A | Discharge status: Home / Self Care | Payer: Medicaid Other | Source: Ambulatory Visit | Attending: Physician Assistant | Admitting: Physician Assistant

## 2021-09-17 DIAGNOSIS — R7401 Elevation of levels of liver transaminase levels: Secondary | ICD-10-CM | POA: Diagnosis present

## 2021-09-17 DIAGNOSIS — F1010 Alcohol abuse, uncomplicated: Secondary | ICD-10-CM | POA: Insufficient documentation

## 2021-09-17 DIAGNOSIS — D696 Thrombocytopenia, unspecified: Secondary | ICD-10-CM | POA: Insufficient documentation

## 2021-09-18 ENCOUNTER — Ambulatory Visit (AMBULATORY_SURGERY_CENTER): Payer: Medicaid Other | Admitting: Gastroenterology

## 2021-09-18 ENCOUNTER — Other Ambulatory Visit: Payer: Self-pay

## 2021-09-18 ENCOUNTER — Encounter: Payer: Self-pay | Admitting: Gastroenterology

## 2021-09-18 VITALS — BP 151/83 | HR 54 | Temp 98.4°F | Resp 20 | Ht 68.0 in | Wt 120.0 lb

## 2021-09-18 DIAGNOSIS — Z86010 Personal history of colonic polyps: Secondary | ICD-10-CM

## 2021-09-18 DIAGNOSIS — D124 Benign neoplasm of descending colon: Secondary | ICD-10-CM

## 2021-09-18 DIAGNOSIS — D123 Benign neoplasm of transverse colon: Secondary | ICD-10-CM

## 2021-09-18 MED ORDER — SODIUM CHLORIDE 0.9 % IV SOLN: 500.0000 mL | Freq: Once | INTRAVENOUS | Status: DC

## 2021-09-18 NOTE — Progress Notes (Signed)
[facility]  [facility] off per pt  No cocaine use in 3 years per pt.

## 2021-09-18 NOTE — Patient Instructions (Signed)
YOU HAD AN ENDOSCOPIC PROCEDURE TODAY AT [facility]:   Refer to the procedure report that was given to you for any specific questions about what was found during the examination.  If the procedure report does not answer your questions, please call your gastroenterologist to clarify.  If you requested that your care partner not be given the details of your procedure findings, then the procedure report has been included in a sealed envelope for you to review at your convenience later.  YOU SHOULD EXPECT: Some feelings of bloating in the abdomen. Passage of more gas than usual.  Walking can help get rid of the air that was put into your GI tract during the procedure and reduce the bloating. If you had a lower endoscopy (such as a colonoscopy or flexible sigmoidoscopy) you may notice spotting of blood in your stool or on the toilet paper. If you underwent a bowel prep for your procedure, you may not have a normal bowel movement for a few days.  Please Note:  You might notice some irritation and congestion in your nose or some drainage.  This is from the oxygen used during your procedure.  There is no need for concern and it should clear up in a day or so.  SYMPTOMS TO REPORT IMMEDIATELY:  Following lower endoscopy (colonoscopy or flexible sigmoidoscopy):  Excessive amounts of blood in the stool  Significant tenderness or worsening of abdominal pains  Swelling of the abdomen that is new, acute  Fever of 100F or higher    For urgent or emergent issues, a gastroenterologist can be reached at any hour by calling [phone]. Do not use MyChart messaging for urgent concerns.    DIET:  We do recommend a small meal at first, but then you may proceed to your regular diet.  Drink plenty of fluids but you should avoid alcoholic beverages for 24 hours.  ACTIVITY:  You should plan to take it easy for the rest of today and you should NOT DRIVE or use heavy machinery until tomorrow (because  of the sedation medicines used during the test).    FOLLOW UP: Our staff will call the number listed on your records 48-72 hours following your procedure to check on you and address any questions or concerns that you may have regarding the information given to you following your procedure. If we do not reach you, we will leave a message.  We will attempt to reach you two times.  During this call, we will ask if you have developed any symptoms of COVID 19. If you develop any symptoms (ie: fever, flu-like symptoms, shortness of breath, cough etc.) before then, please call [phone].  If you test positive for Covid 19 in the 2 weeks post procedure, please call and report this information to [place].    If any biopsies were taken you will be contacted by phone or by letter within the next 1-3 weeks.  Please call us at [phone] if you have not heard about the biopsies in 3 weeks.    SIGNATURES/CONFIDENTIALITY: You and/or your care partner have signed paperwork which will be entered into your electronic medical record.  These signatures attest to the fact that that the information above on your After Visit Summary has been reviewed and is understood.  Full responsibility of the confidentiality of this discharge information lies with you and/or your care-partner.    RESUME MEDICATIONS. INFORMATION GIVEN ON POLYPS,HEMORRHOIDS,DIVERTICULOSIS AND HEMORRHOID BANDING.

## 2021-09-18 NOTE — Progress Notes (Signed)
Pt stable to RR 

## 2021-09-18 NOTE — Op Note (Signed)
[facility] Patient Name: [name] Procedure Date: 09/18/2021 10:21 AM MRN: [id number] Endoscopist: Remo Lipps P. Havery Moros , MD Age: 60 Referring MD:  Date of Birth: 1961-06-18 Gender: Male Account #: [id number] Procedure:                Colonoscopy Indications:              High risk colon cancer surveillance: Personal                            history of colonic polyps - 6 adenomas removed                            02/2018, also with occasional rectal bleeding (he                            denies any recent problems with bleeding) Medicines:                Monitored Anesthesia Care Procedure:                Pre-Anesthesia Assessment:                           - Prior to the procedure, a History and Physical                            was performed, and patient medications and                            allergies were reviewed. The patient's tolerance of                            previous anesthesia was also reviewed. The risks                            and benefits of the procedure and the sedation                            options and risks were discussed with the patient.                            All questions were answered, and informed consent                            was obtained. Prior Anticoagulants: The patient has                            taken no previous anticoagulant or antiplatelet                            agents. ASA Grade Assessment: III - A patient with                            severe systemic disease. After reviewing the risks  and benefits, the patient was deemed in                            satisfactory condition to undergo the procedure.                           After obtaining informed consent, the colonoscope                            was passed under direct vision. Throughout the                            procedure, the patient's blood pressure, pulse, and                            oxygen saturations  were monitored continuously. The                            Olympus PCF-[id number]) Colonoscope was                            introduced through the anus and advanced to the the                            cecum, identified by appendiceal orifice and                            ileocecal valve. The colonoscopy was performed                            without difficulty. The patient tolerated the                            procedure well. The quality of the bowel                            preparation was good. The ileocecal valve,                            appendiceal orifice, and rectum were photographed. Scope In: 10:42:35 AM Scope Out: 11:05:53 AM Scope Withdrawal Time: 0 hours 16 minutes 35 seconds  Total Procedure Duration: 0 hours 23 minutes 18 seconds  Findings:                 The perianal and digital rectal examinations were                            normal.                           A 3 to 4 mm polyp was found in the descending                            colon. The polyp was sessile. The polyp was removed  with a cold snare. Resection and retrieval were                            complete.                           A few small-mouthed diverticula were found in the                            transverse colon and ascending colon.                           Internal hemorrhoids were found during                            retroflexion. The hemorrhoids were moderate.                           The colon was tortous. Pediatric colonoscope used                            given the patient's BMI. The exam was otherwise                            without abnormality. Complications:            No immediate complications. Estimated blood loss:                            Minimal. Estimated Blood Loss:     Estimated blood loss was minimal. Impression:               - One 3 to 4 mm polyp in the descending colon,                            removed with a cold  snare. Resected and retrieved.                           - Diverticulosis in the transverse colon and in the                            ascending colon.                           - Internal hemorrhoids, which is causing prior                            bleeding symptoms.                           - Tortous colon                           - The examination was otherwise normal. Recommendation:           - Patient has a contact number available for  emergencies. The signs and symptoms of potential                            delayed complications were discussed with the                            patient. Return to normal activities tomorrow.                            Written discharge instructions were provided to the                            patient.                           - Resume previous diet.                           - Continue present medications.                           - Await pathology results.                           - If hemorrhoids bother you moving forward can use                            Calmol4 suppositories PRN, consider hemorrhoid                            banding if symptoms persist. Remo Lipps P. Tyana Butzer, MD 09/18/2021 11:10:30 AM This report has been signed electronically.

## 2021-09-18 NOTE — Progress Notes (Signed)
History and Physical Interval Note: 60 y/o male here for colonoscopy for surveillance of colon polyps and to evaluate recent rectal bleeding. History of internal hemorrhoids. Seen in office 09/05/21 without interval changes. Feels okay today. Of note significant EtOH use, awaiting [place] to assess for cirrhosis, has low platelets. He agrees to proceed with colonoscopy.   09/18/2021 10:35 AM  [name]  has presented today for endoscopic procedure(s), with the diagnosis of  Encounter Diagnosis  Name Primary?   Hx of adenomatous polyp of colon Yes  .  The various methods of evaluation and treatment have been discussed with the patient and/or family. After consideration of risks, benefits and other options for treatment, the patient has consented to  the endoscopic procedure(s).   The patient's history has been reviewed, patient examined, no change in status, stable for surgery.  I have reviewed the patient's chart and labs.  Questions were answered to the patient's satisfaction.    Jolly Mango, MD [facility]

## 2021-09-22 ENCOUNTER — Telehealth: Payer: Self-pay

## 2021-09-22 NOTE — Telephone Encounter (Signed)
  Follow up Call-  Call back number 09/18/2021  Post procedure Call Back phone  # [phone]  Permission to leave phone message Yes  Some recent data might be hidden     Patient questions:  Do you have a fever, pain , or abdominal swelling? No. Pain Score  0 *  Have you tolerated food without any problems? Yes.    Have you been able to return to your normal activities? Yes.    Do you have any questions about your discharge instructions: Diet   No. Medications  No. Follow up visit  No.  Do you have questions or concerns about your Care? No.  Actions: * If pain score is 4 or above: No action needed, pain <4.  Have you developed a fever since your procedure? no  2.   Have you had an respiratory symptoms (SOB or cough) since your procedure? no  3.   Have you tested positive for COVID 19 since your procedure no  4.   Have you had any family members/close contacts diagnosed with the COVID 19 since your procedure?  no   If yes to any of these questions please route to Joylene John, RN and Joella Prince, RN

## 2021-09-24 ENCOUNTER — Encounter: Payer: Self-pay | Admitting: Gastroenterology

## 2021-09-25 NOTE — Addendum Note (Signed)
Addended by: Virgina Evener A on: 09/25/2021 02:38 PM   Modules accepted: Orders

## 2021-09-30 ENCOUNTER — Telehealth: Payer: Self-pay | Admitting: Gastroenterology

## 2021-09-30 ENCOUNTER — Telehealth: Payer: Self-pay | Admitting: *Deleted

## 2021-09-30 NOTE — Telephone Encounter (Signed)
Returned patient's phone call, spoke with patient 

## 2021-09-30 NOTE — Telephone Encounter (Signed)
Inbound call from patient requesting a call back to discuss results for US Abdomen

## 2021-09-30 NOTE — Telephone Encounter (Signed)
Called and spoke with patient in regards to his [place] results and recommendations. Pt states that he would like to proceed with MRCP. Pt knows to expect a call from radiology scheduling to set up his appt. Pt denies claustrophobia. Pt verbalized understanding and had no concerns at the end of the call.   Secure staff message sent to radiology scheduling to contact pt to set up appt. MRCP order in epic.

## 2021-10-08 ENCOUNTER — Other Ambulatory Visit: Payer: Self-pay | Admitting: Physician Assistant

## 2021-10-08 ENCOUNTER — Ambulatory Visit (HOSPITAL_COMMUNITY)
Admission: RE | Admit: 2021-10-08 | Discharge: 2021-10-08 | Disposition: A | Discharge status: Home / Self Care | Payer: Medicaid Other | Source: Ambulatory Visit | Attending: Physician Assistant | Admitting: Physician Assistant

## 2021-10-08 ENCOUNTER — Other Ambulatory Visit: Payer: Self-pay

## 2021-10-08 DIAGNOSIS — K838 Other specified diseases of biliary tract: Secondary | ICD-10-CM

## 2021-10-08 MED ORDER — GADOBUTROL 1 MMOL/ML IV SOLN
5.0000 mL | Freq: Once | INTRAVENOUS | Status: AC | PRN
  Administered 2021-10-08: 11:00:00 5 mL via INTRAVENOUS

## 2021-11-06 ENCOUNTER — Ambulatory Visit (HOSPITAL_COMMUNITY)
Admission: RE | Admit: 2021-11-06 | Discharge: 2021-11-06 | Disposition: A | Discharge status: Home / Self Care | Payer: Medicaid Other | Source: Ambulatory Visit | Attending: Pulmonary Disease | Admitting: Pulmonary Disease

## 2021-11-06 ENCOUNTER — Other Ambulatory Visit: Payer: Self-pay

## 2021-11-06 DIAGNOSIS — C090 Malignant neoplasm of tonsillar fossa: Secondary | ICD-10-CM | POA: Diagnosis not present

## 2021-11-13 ENCOUNTER — Ambulatory Visit (INDEPENDENT_AMBULATORY_CARE_PROVIDER_SITE_OTHER): Payer: Medicaid Other | Admitting: Pulmonary Disease

## 2021-11-13 ENCOUNTER — Encounter: Payer: Self-pay | Admitting: Pulmonary Disease

## 2021-11-13 ENCOUNTER — Other Ambulatory Visit: Payer: Self-pay

## 2021-11-13 VITALS — BP 96/62 | HR 63 | Temp 97.7°F | Ht 67.0 in | Wt 122.6 lb

## 2021-11-13 DIAGNOSIS — Z8589 Personal history of malignant neoplasm of other organs and systems: Secondary | ICD-10-CM

## 2021-11-13 DIAGNOSIS — R911 Solitary pulmonary nodule: Secondary | ICD-10-CM | POA: Insufficient documentation

## 2021-11-13 DIAGNOSIS — Z720 Tobacco use: Secondary | ICD-10-CM

## 2021-11-13 DIAGNOSIS — F1010 Alcohol abuse, uncomplicated: Secondary | ICD-10-CM | POA: Diagnosis not present

## 2021-11-13 DIAGNOSIS — C090 Malignant neoplasm of tonsillar fossa: Secondary | ICD-10-CM

## 2021-11-13 NOTE — Patient Instructions (Signed)
Thank you for visiting Dr. Valeta Harms at [facility]. Today we recommend the following: Orders Placed This Encounter  Procedures   Procedural/ Surgical Case Request: ROBOTIC ASSISTED NAVIGATIONAL BRONCHOSCOPY   Ambulatory referral to Pulmonology   Bronchoscopy on 12/02/2021  Return in about 26 days (around 12/09/2021) for with Eric Form, NP.    Please do your part to reduce the spread of COVID-19.

## 2021-11-13 NOTE — Progress Notes (Signed)
Synopsis: Referred in January 2023 for lung nodule by Nolene Ebbs, MD  Subjective:   PATIENT ID: [name] GENDER: male DOB: 26-Sep-1961, MRN: [id number]  Chief Complaint  Patient presents with   Consult    Patient here to talk about ct    This is a 61 year old gentleman, past medical history of left tonsillar cancer, history of chemotherapy history of radiation, occasional alcohol use, past history of cocaine use denied current use, patient found to have left tonsil and bilateral neck disease with radiation treatments by Dr. Isidore Moos.  CT image staging revealed a pulmonary nodule That was initially found in July 2022.  Recommended subsequent CT scan follow-up.  Patient here after follow-up CT imaging on 11/06/2021 which revealed a similar interval increase in size of the sup solid nodule located in the right upper lobe concerning for a slow-growing indolent neoplasm.  Patient is still smoking 1 pack/day and drinking 3 beers per day.   Past Medical History:  Diagnosis Date   Alcoholism ([place])    Arthritis    Cancer ([place])    left tonsil cancer   History of chemotherapy    History of radiation therapy 09/01/17- 10/25/17   Left tonsil and bilateral neck 70 Gy in 35 fractions to gross disease, 63 gy in 35 fractions to high risk nodal echelons, and 56 Gy in 35 fraction to intermediate risk nodal echelons.      Family History  Problem Relation Age of Onset   Other Mother        pre-diabetes   Colon cancer Neg Hx    Esophageal cancer Neg Hx    Liver cancer Neg Hx    Pancreatic cancer Neg Hx    Rectal cancer Neg Hx    Stomach cancer Neg Hx      Past Surgical History:  Procedure Laterality Date   COLONOSCOPY     IR FLUORO GUIDE PORT INSERTION RIGHT  09/13/2017   IR GASTROSTOMY TUBE MOD SED  09/13/2017   IR GASTROSTOMY TUBE REMOVAL  03/28/2018   IR REMOVAL TUN ACCESS W/ PORT W/O FL MOD SED  03/28/2018   IR US GUIDE VASC ACCESS RIGHT  09/13/2017   MULTIPLE EXTRACTIONS WITH  ALVEOLOPLASTY N/A 08/09/2017   Procedure: Extraction of tooth #'s 1-6, 11-17,21,22,26-30 and 32 with alveoloplasty and bilateral mandibular tori reductions;  Surgeon: Lenn Cal, DDS;  Location: [facility];  Service: Oral Surgery;  Laterality: N/A;   tonsil biopsy     left tonsil    Social History   Socioeconomic History   Marital status: Single    Spouse name: Not on file   Number of children: 1   Years of education: Not on file   Highest education level: Not on file  Occupational History   Occupation: Disabled  Tobacco Use   Smoking status: Light Smoker    Packs/day: 1.00    Years: 30.00    Pack years: 30.00    Types: Cigarettes   Smokeless tobacco: Never   Tobacco comments:    Tobacco info given  Vaping Use   Vaping Use: Never used  Substance and Sexual Activity   Alcohol use: Yes    Alcohol/week: 7.0 standard drinks    Types: 7 Cans of beer per week    Comment: 1 pint daily or more   Drug use: No    Types: Cocaine    Comment: he has a past history of cocaine abuse, he denies current use   Sexual activity: Not  on file  Other Topics Concern   Not on file  Social History Narrative   Not on file   Social Determinants of Health   Financial Resource Strain: Not on file  Food Insecurity: Not on file  Transportation Needs: Not on file  Physical Activity: Not on file  Stress: Not on file  Social Connections: Not on file  Intimate Partner Violence: Not on file     Not on File   No outpatient medications prior to visit.   No facility-administered medications prior to visit.    Review of Systems  Constitutional:  Positive for weight loss. Negative for chills, fever and malaise/fatigue.  HENT:  Negative for hearing loss, sore throat and tinnitus.   Eyes:  Negative for blurred vision and double vision.  Respiratory:  Positive for shortness of breath. Negative for cough, hemoptysis, sputum production, wheezing and stridor.   Cardiovascular:  Negative for chest  pain, palpitations, orthopnea, leg swelling and PND.  Gastrointestinal:  Negative for abdominal pain, constipation, diarrhea, heartburn, nausea and vomiting.  Genitourinary:  Negative for dysuria, hematuria and urgency.  Musculoskeletal:  Negative for joint pain and myalgias.  Skin:  Negative for itching and rash.  Neurological:  Negative for dizziness, tingling, weakness and headaches.  Endo/Heme/Allergies:  Negative for environmental allergies. Does not bruise/bleed easily.  Psychiatric/Behavioral:  Negative for depression. The patient is not nervous/anxious and does not have insomnia.   All other systems reviewed and are negative.   Objective:  Physical Exam Vitals reviewed.  Constitutional:      General: He is not in acute distress.    Appearance: He is well-developed.     Comments: Frail thin, low BMI  HENT:     Head: Normocephalic and atraumatic.  Eyes:     General: No scleral icterus.    Conjunctiva/sclera: Conjunctivae normal.     Pupils: Pupils are equal, round, and reactive to light.  Neck:     Vascular: No JVD.     Trachea: No tracheal deviation.  Cardiovascular:     Rate and Rhythm: Normal rate and regular rhythm.     Heart sounds: Normal heart sounds. No murmur heard. Pulmonary:     Effort: Pulmonary effort is normal. No tachypnea, accessory muscle usage or respiratory distress.     Breath sounds: No stridor. No wheezing, rhonchi or rales.     Comments: Diminished breath sounds bilaterally Abdominal:     General: There is no distension.     Palpations: Abdomen is soft.     Tenderness: There is no abdominal tenderness.  Musculoskeletal:        General: No tenderness.     Cervical back: Neck supple.  Lymphadenopathy:     Cervical: No cervical adenopathy.  Skin:    General: Skin is warm and dry.     Capillary Refill: Capillary refill takes less than 2 seconds.     Findings: No rash.  Neurological:     Mental Status: He is alert and oriented to person, place,  and time.  Psychiatric:        Behavior: Behavior normal.     Vitals:   11/13/21 1351  BP: 96/62  Pulse: 63  Temp: 97.7 F (36.5 C)  TempSrc: Oral  SpO2: 100%  Weight: 122 lb 9.6 oz (55.6 kg)  Height: 5\' 7"  (1.702 m)   100% on RA BMI Readings from Last 3 Encounters:  11/13/21 19.20 kg/m  09/18/21 18.25 kg/m  09/05/21 18.25 kg/m   Wt Readings from  Last 3 Encounters:  11/13/21 122 lb 9.6 oz (55.6 kg)  09/18/21 120 lb (54.4 kg)  09/05/21 120 lb (54.4 kg)     CBC    Component Value Date/Time   WBC 2.5 (L) 09/05/2021 1211   RBC 4.02 (L) 09/05/2021 1211   HGB 11.8 (L) 09/05/2021 1211   HGB 12.1 (L) 12/28/2018 1534   HGB 10.0 (L) 10/22/2017 1422   HCT 36.7 (L) 09/05/2021 1211   HCT 30.1 (L) 10/22/2017 1422   PLT 94.0 (L) 09/05/2021 1211   PLT 92 (L) 12/28/2018 1534   PLT 216 10/22/2017 1422   MCV 91.1 09/05/2021 1211   MCV 87.0 10/22/2017 1422   MCH 28.5 12/28/2018 1534   MCHC 32.3 09/05/2021 1211   RDW 15.8 (H) 09/05/2021 1211   RDW 13.9 10/22/2017 1422   LYMPHSABS 0.6 (L) 09/05/2021 1211   LYMPHSABS 0.2 (L) 10/22/2017 1422   MONOABS 0.5 09/05/2021 1211   MONOABS 0.7 10/22/2017 1422   EOSABS 0.0 09/05/2021 1211   EOSABS 0.0 10/22/2017 1422   BASOSABS 0.0 09/05/2021 1211   BASOSABS 0.0 10/22/2017 1422     Chest Imaging: CT chest 11/06/2021: Right upper lobe sup solid pulmonary nodule slowly enlarging over time. Concerning for a indolent neoplasm. The patient's images have been independently reviewed by me.    Pulmonary Functions Testing Results: No flowsheet data found.  FeNO:   Pathology:   Echocardiogram:   Heart Catheterization:     Assessment & Plan:     ICD-10-CM   1. Right upper lobe pulmonary nodule  R91.1 Procedural/ Surgical Case Request: ROBOTIC ASSISTED NAVIGATIONAL BRONCHOSCOPY    Ambulatory referral to Pulmonology    2. History of head and neck cancer  Z85.89     3. 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     4. Alcohol  abuse  F10.10     5. Tobacco abuse  Z72.0       Discussion:  This is a 61 year old gentleman longstanding history of tobacco abuse, daily smoker still 1 pack/day, daily alcohol use approximately 3 beers per day.  History of tonsillar cancer with a slowly enlarging subsolid nodule in the right upper lobe.  Plan: I am not sure that he would be a ideal surgical candidate with such low BMI, and ongoing tobacco and alcohol abuse. Therefore I think we should consider biopsy of the lesion with fiducial marking and consideration for SBRT if we are dealing with malignancy. We discussed this today in the office. Discussed the risk of robotic assisted bronchoscopy with navigation to include bleeding and pneumothorax. Patient is agreeable to proceed. Patient was counseled on smoking cessation and alcohol use cessation  I spent 63 minutes dedicated to the care of this patient on the date of this encounter to include pre-visit review of records, face-to-face time with the patient discussing conditions above, post visit ordering of testing, clinical documentation with the electronic health record, making appropriate referrals as documented, and communicating necessary findings to members of the patients care team.   Garner Nash, DO [facility] 11/13/2021 2:13 PM

## 2021-11-28 ENCOUNTER — Encounter (HOSPITAL_COMMUNITY): Payer: Self-pay | Admitting: Pulmonary Disease

## 2021-11-28 ENCOUNTER — Other Ambulatory Visit: Payer: Self-pay

## 2021-11-28 NOTE — Progress Notes (Signed)
Spoke with pt for pre-op call. Pt denies cardiac history, HTN or Diabetes. Pt has hx of cocaine abuse, he states he no longer uses cocaine. Pt was supposed to go for a Covid test today, but forgot or didn't realize what day today was. I instructed him to go first thing Monday AM. He stated he would go. Also instructed him that he needed a responsible adult to drive him home and stay with him 24 hours after the procedure is done. He states he will find someone.

## 2021-12-01 ENCOUNTER — Other Ambulatory Visit: Payer: Self-pay | Admitting: Pulmonary Disease

## 2021-12-01 LAB — SARS CORONAVIRUS 2 (TAT 6-24 HRS): SARS Coronavirus 2: NEGATIVE

## 2021-12-02 ENCOUNTER — Encounter (HOSPITAL_COMMUNITY): Payer: Self-pay | Admitting: Pulmonary Disease

## 2021-12-02 ENCOUNTER — Encounter (HOSPITAL_COMMUNITY): Payer: Self-pay | Admitting: Certified Registered Nurse Anesthetist

## 2021-12-02 ENCOUNTER — Ambulatory Visit (HOSPITAL_COMMUNITY): Admission: RE | Admit: 2021-12-02 | Payer: Medicaid Other | Source: Home / Self Care | Admitting: Pulmonary Disease

## 2021-12-02 DIAGNOSIS — R911 Solitary pulmonary nodule: Secondary | ICD-10-CM | POA: Insufficient documentation

## 2021-12-02 SURGERY — BRONCHOSCOPY, WITH BIOPSY USING ELECTROMAGNETIC NAVIGATION
Anesthesia: General | Laterality: Right

## 2021-12-02 MED ORDER — LACTATED RINGERS IV SOLN: INTRAVENOUS | Status: DC

## 2021-12-02 MED ORDER — CHLORHEXIDINE GLUCONATE 0.12 % MT SOLN
15.0000 mL | OROMUCOSAL | Status: AC
  Filled 2021-12-02: qty 15

## 2021-12-04 ENCOUNTER — Ambulatory Visit: Payer: Medicaid Other | Admitting: Gastroenterology

## 2021-12-09 ENCOUNTER — Ambulatory Visit: Payer: Medicaid Other | Admitting: Acute Care

## 2021-12-09 NOTE — Progress Notes (Unsigned)
History of Present Illness [name] is a 60 y.o. male every day smoker ( 1 PPD) with pack year history of 30 pack years, daily alcohol use approximately 3 beers per day.  History of tonsillar cancer with a slowly enlarging subsolid nodule in the right upper lobe.Followed by Dr. Valeta Harms for nodule surveillance.   Doubt he would be a ideal surgical candidate with such low BMI, and ongoing tobacco and alcohol abuse. Therefore robotic navigational bronchoscopy with  biopsy of the lesion and  fiducial markings with  consideration for SBRT if this is a with malignancy.Pt. had this scheduled and cancelled it.    12/09/2021 Pt. Presents for follow up. He was scheduled for a robotic navigational bronch, but cancelled this.   Test Results:  CBC Latest Ref Rng & Units 09/05/2021 12/28/2018 03/28/2018  WBC 4.0 - 10.5 K/uL 2.5(L) 3.4(L) 2.4(L)  Hemoglobin 13.0 - 17.0 g/dL 11.8(L) 12.1(L) 12.8(L)  Hematocrit 39.0 - 52.0 % 36.7(L) 37.7(L) 39.3  Platelets 150.0 - 400.0 K/uL 94.0(L) 92(L) 137(L)    BMP Latest Ref Rng & Units 09/05/2021 05/02/2021 12/28/2018  Glucose 70 - 99 mg/dL 94 - 104(H)  BUN 6 - 23 mg/dL 8 11 14   Creatinine 0.40 - 1.50 mg/dL 0.74 0.76 0.79  Sodium 135 - 145 mEq/L 138 - 139  Potassium 3.5 - 5.1 mEq/L 4.1 - 4.2  Chloride 96 - 112 mEq/L 99 - 100  CO2 19 - 32 mEq/L 30 - 28  Calcium 8.4 - 10.5 mg/dL 9.8 - 10.0    BNP No results found for: BNP  ProBNP No results found for: PROBNP  PFT No results found for: FEV1PRE, FEV1POST, FVCPRE, FVCPOST, TLC, DLCOUNC, PREFEV1FVCRT, PSTFEV1FVCRT  No results found.   Past medical hx Past Medical History:  Diagnosis Date   Alcoholism ([place])    Arthritis    Cancer ([place])    left tonsil cancer   History of chemotherapy    History of radiation therapy 09/01/17- 10/25/17   Left tonsil and bilateral neck 70 Gy in 35 fractions to gross disease, 63 gy in 35 fractions to high risk nodal echelons, and 56 Gy in 35 fraction to intermediate  risk nodal echelons.      Social History   Tobacco Use   Smoking status: Every Day    Packs/day: 0.25    Years: 30.00    Pack years: 7.50    Types: Cigarettes   Smokeless tobacco: Never   Tobacco comments:    Tobacco info given  Vaping Use   Vaping Use: Never used  Substance Use Topics   Alcohol use: Yes    Alcohol/week: 7.0 standard drinks    Types: 7 Cans of beer per week    Comment: 1 pint daily or more   Drug use: Yes    Types: Cocaine, Marijuana    Comment: he has a past history of cocaine abuse, he denies current use    [name] reports that he has been smoking cigarettes. He has a 7.50 pack-year smoking history. He has never used smokeless tobacco. He reports current alcohol use of about 7.0 standard drinks per week. He reports current drug use. Drugs: Cocaine and Marijuana.  Tobacco Cessation: Ready to quit: Not Answered Counseling given: Not Answered Tobacco comments: Tobacco info given   Past surgical hx, Family hx, Social hx all reviewed.  No current outpatient medications on file prior to visit.   Current Facility-Administered Medications on File Prior to Visit  Medication   lactated ringers  infusion     No Known Allergies  Review Of Systems:  Constitutional:   No  weight loss, night sweats,  Fevers, chills, fatigue, or  lassitude.  HEENT:   No headaches,  Difficulty swallowing,  Tooth/dental problems, or  Sore throat,                No sneezing, itching, ear ache, nasal congestion, post nasal drip,   CV:  No chest pain,  Orthopnea, PND, swelling in lower extremities, anasarca, dizziness, palpitations, syncope.   GI  No heartburn, indigestion, abdominal pain, nausea, vomiting, diarrhea, change in bowel habits, loss of appetite, bloody stools.   Resp: No shortness of breath with exertion or at rest.  No excess mucus, no productive cough,  No non-productive cough,  No coughing up of blood.  No change in color of mucus.  No wheezing.  No chest wall  deformity  Skin: no rash or lesions.  GU: no dysuria, change in color of urine, no urgency or frequency.  No flank pain, no hematuria   MS:  No joint pain or swelling.  No decreased range of motion.  No back pain.  Psych:  No change in mood or affect. No depression or anxiety.  No memory loss.   Vital Signs There were no vitals taken for this visit.   Physical Exam:  General- No distress,  A&Ox3 ENT: No sinus tenderness, TM clear, pale nasal mucosa, no oral exudate,no post nasal drip, no LAN Cardiac: S1, S2, regular rate and rhythm, no murmur Chest: No wheeze/ rales/ dullness; no accessory muscle use, no nasal flaring, no sternal retractions Abd.: Soft Non-tender Ext: No clubbing cyanosis, edema Neuro:  normal strength Skin: No rashes, warm and dry Psych: normal mood and behavior   Assessment/Plan  No problem-specific Assessment & Plan notes found for this encounter.    Magdalen Spatz, NP 12/09/2021  12:59 PM

## 2021-12-10 ENCOUNTER — Telehealth: Payer: Self-pay | Admitting: Pulmonary Disease

## 2021-12-10 NOTE — Telephone Encounter (Signed)
Vallarie Mare - it looks like you worked with this patient.  Will you please get it rescheduled?

## 2021-12-10 NOTE — Telephone Encounter (Signed)
PCCM:   I called and spoke with the patient as well as his son [name].  The patient unfortunately did not have a ride to the hospital for the day of his procedure.  There was confusion about the scheduling and date according to the patient's son.  He was unaware of this and the patient needs transportation back and forth to his healthcare appointments.  I explained that our next available day for a procedure would be March 14.  Please reschedule patient for Tuesday, March 14 [name].  Once his information and scheduling letter is created please contact [name] the patient's son.  Phone number located in the chart.  [name] Son     [phone]     Thanks  Garner Nash, DO [facility] 12/10/2021 9:46 AM

## 2021-12-12 ENCOUNTER — Encounter: Payer: Self-pay | Admitting: Pulmonary Disease

## 2021-12-26 ENCOUNTER — Other Ambulatory Visit: Payer: Self-pay | Admitting: Pulmonary Disease

## 2021-12-26 LAB — SARS CORONAVIRUS 2 (TAT 6-24 HRS): SARS Coronavirus 2: NEGATIVE

## 2021-12-29 ENCOUNTER — Encounter (HOSPITAL_COMMUNITY): Payer: Self-pay | Admitting: Pulmonary Disease

## 2021-12-29 ENCOUNTER — Other Ambulatory Visit: Payer: Self-pay

## 2021-12-29 NOTE — Pre-Procedure Instructions (Signed)
?[address] [facility] ?[place] [address] ?Phone: [phone] Fax: [phone] ? ? ?PCP - Nolene Ebbs, MD ? ?Chest x-ray - 05/02/21 ? ?ERAS Protcol - Clears until 0500 ? ?COVID TEST- 12/26/20 Negative ? ?Anesthesia review: N ? ?Patient verbally denies any shortness of breath, fever, cough and chest pain during phone call ? ? ?-------------  SDW INSTRUCTIONS given: ? ?Your procedure is scheduled on 12/30/21. ? Report to [facility] Main Entrance "A" at 0530 A.M., and check in at the Admitting office. ? Call this number if you have problems the morning of surgery: ? [phone] ? ? Remember: ? Do not eat after midnight the night before your surgery ? ?You may drink clear liquids until 0500 the morning of your surgery.   ?Clear liquids allowed are: Water, Non-Citrus Juices (without pulp), Carbonated Beverages, Clear Tea, Black Coffee Only, and Gatorade ?  ? Take these medicines the morning of surgery with A SIP OF WATER  ?None ? ?As of today, STOP taking any Aspirin (unless otherwise instructed by your surgeon) Aleve, Naproxen, Ibuprofen, Motrin, Advil, Goody's, BC's, all herbal medications, fish oil, and all vitamins. ? ?         ?           Do not wear jewelry, make up, or nail polish ?           Do not wear lotions, powders, perfumes/colognes, or deodorant. ?           Do not shave 48 hours prior to surgery.  Men may shave face and neck. ?           Do not bring valuables to the hospital. ?           [facility] is not responsible for any belongings or valuables. ? ?Do NOT Smoke (Tobacco/Vaping) 24 hours prior to your procedure ?If you use a CPAP at night, you may bring all equipment for your overnight stay. ?  ?Contacts, glasses, dentures or bridgework may not be worn into surgery.    ?  ?For patients admitted to the hospital, discharge time will be determined by your treatment team. ?  ?Patients discharged the day of surgery will  not be allowed to drive home, and someone needs to stay with them for 24 hours. ? ? ? ?Special instructions:   ?[facility]- Preparing For Surgery ? ?Before surgery, you can play an important role. Because skin is not sterile, your skin needs to be as free of germs as possible. You can reduce the number of germs on your skin by washing with CHG (chlorahexidine gluconate) Soap before surgery.  CHG is an antiseptic cleaner which kills germs and bonds with the skin to continue killing germs even after washing.   ? ?Oral Hygiene is also important to reduce your risk of infection.  Remember - BRUSH YOUR TEETH THE MORNING OF SURGERY WITH YOUR REGULAR TOOTHPASTE ? ?Please do not use if you have an allergy to CHG or antibacterial soaps. If your skin becomes reddened/irritated stop using the CHG.  ?Do not shave (including legs and underarms) for at least 48 hours prior to first CHG shower. It is OK to shave your face. ? ?Please follow these instructions carefully. ?  ?Shower the NIGHT BEFORE SURGERY and the MORNING OF SURGERY with DIAL Soap.  ? ?Pat yourself dry with a CLEAN TOWEL. ? ?Wear CLEAN PAJAMAS to bed the night  before surgery ? ?Place CLEAN SHEETS on your bed the night of your first shower and DO NOT SLEEP WITH PETS. ? ? ?Day of Surgery: ?Please shower morning of surgery  ?Wear Clean/Comfortable clothing the morning of surgery ?Do not apply any deodorants/lotions.   ?Remember to brush your teeth WITH YOUR REGULAR TOOTHPASTE. ?  ?Questions were answered. Patient verbalized understanding of instructions.  ? ? ?    ?

## 2022-01-02 ENCOUNTER — Ambulatory Visit (HOSPITAL_BASED_OUTPATIENT_CLINIC_OR_DEPARTMENT_OTHER): Payer: Medicaid Other | Admitting: Certified Registered Nurse Anesthetist

## 2022-01-02 ENCOUNTER — Ambulatory Visit (HOSPITAL_COMMUNITY): Payer: Medicaid Other | Admitting: Certified Registered Nurse Anesthetist

## 2022-01-02 ENCOUNTER — Other Ambulatory Visit: Payer: Self-pay

## 2022-01-02 ENCOUNTER — Ambulatory Visit (HOSPITAL_COMMUNITY)
Admission: RE | Admit: 2022-01-02 | Discharge: 2022-01-02 | Disposition: A | Discharge status: Home / Self Care | Payer: Medicaid Other | Attending: Pulmonary Disease | Admitting: Pulmonary Disease

## 2022-01-02 ENCOUNTER — Encounter (HOSPITAL_COMMUNITY)
Admission: RE | Disposition: A | Discharge status: Home / Self Care | Payer: Self-pay | Source: Home / Self Care | Attending: Pulmonary Disease

## 2022-01-02 ENCOUNTER — Encounter (HOSPITAL_COMMUNITY): Payer: Self-pay | Admitting: Pulmonary Disease

## 2022-01-02 ENCOUNTER — Ambulatory Visit (HOSPITAL_COMMUNITY): Payer: Medicaid Other

## 2022-01-02 DIAGNOSIS — D649 Anemia, unspecified: Secondary | ICD-10-CM

## 2022-01-02 DIAGNOSIS — F17210 Nicotine dependence, cigarettes, uncomplicated: Secondary | ICD-10-CM | POA: Insufficient documentation

## 2022-01-02 DIAGNOSIS — M1990 Unspecified osteoarthritis, unspecified site: Secondary | ICD-10-CM | POA: Diagnosis not present

## 2022-01-02 DIAGNOSIS — R911 Solitary pulmonary nodule: Secondary | ICD-10-CM

## 2022-01-02 DIAGNOSIS — R846 Abnormal cytological findings in specimens from respiratory organs and thorax: Secondary | ICD-10-CM | POA: Diagnosis not present

## 2022-01-02 DIAGNOSIS — Z419 Encounter for procedure for purposes other than remedying health state, unspecified: Secondary | ICD-10-CM

## 2022-01-02 DIAGNOSIS — Z8589 Personal history of malignant neoplasm of other organs and systems: Secondary | ICD-10-CM | POA: Insufficient documentation

## 2022-01-02 DIAGNOSIS — F1010 Alcohol abuse, uncomplicated: Secondary | ICD-10-CM | POA: Diagnosis not present

## 2022-01-02 DIAGNOSIS — Z9889 Other specified postprocedural states: Secondary | ICD-10-CM

## 2022-01-02 HISTORY — PX: FIDUCIAL MARKER PLACEMENT: SHX6858

## 2022-01-02 HISTORY — PX: BRONCHIAL BRUSHINGS: SHX5108

## 2022-01-02 HISTORY — PX: BRONCHIAL BIOPSY: SHX5109

## 2022-01-02 LAB — CBC
HCT: 38.4 % — ABNORMAL LOW (ref 39.0–52.0)
Hemoglobin: 13.1 g/dL (ref 13.0–17.0)
MCH: 30 pg (ref 26.0–34.0)
MCHC: 34.1 g/dL (ref 30.0–36.0)
MCV: 88.1 fL (ref 80.0–100.0)
Platelets: 130 10*3/uL — ABNORMAL LOW (ref 150–400)
RBC: 4.36 MIL/uL (ref 4.22–5.81)
RDW: 14.9 % (ref 11.5–15.5)
WBC: 2.7 10*3/uL — ABNORMAL LOW (ref 4.0–10.5)
nRBC: 0 % (ref 0.0–0.2)

## 2022-01-02 LAB — COMPREHENSIVE METABOLIC PANEL
ALT: 20 U/L (ref 0–44)
AST: 48 U/L — ABNORMAL HIGH (ref 15–41)
Albumin: 4.4 g/dL (ref 3.5–5.0)
Alkaline Phosphatase: 53 U/L (ref 38–126)
Anion gap: 13 (ref 5–15)
BUN: 7 mg/dL (ref 6–20)
CO2: 27 mmol/L (ref 22–32)
Calcium: 9.3 mg/dL (ref 8.9–10.3)
Chloride: 101 mmol/L (ref 98–111)
Creatinine, Ser: 0.76 mg/dL (ref 0.61–1.24)
GFR, Estimated: >60 mL/min (ref >60)
Glucose, Bld: 106 mg/dL — ABNORMAL HIGH (ref 70–99)
Potassium: 3.3 mmol/L — ABNORMAL LOW (ref 3.5–5.1)
Sodium: 141 mmol/L (ref 135–145)
Total Bilirubin: 0.9 mg/dL (ref 0.3–1.2)
Total Protein: 7.5 g/dL (ref 6.5–8.1)

## 2022-01-02 SURGERY — BRONCHOSCOPY, WITH BIOPSY USING ELECTROMAGNETIC NAVIGATION
Anesthesia: General | Laterality: Right

## 2022-01-02 MED ORDER — ONDANSETRON HCL 4 MG/2ML IJ SOLN
INTRAMUSCULAR | Status: DC | PRN
  Administered 2022-01-02: 4 mg via INTRAVENOUS

## 2022-01-02 MED ORDER — SUGAMMADEX SODIUM 200 MG/2ML IV SOLN
INTRAVENOUS | Status: DC | PRN
  Administered 2022-01-02: 200 mg via INTRAVENOUS

## 2022-01-02 MED ORDER — PHENYLEPHRINE 40 MCG/ML (10ML) SYRINGE FOR IV PUSH (FOR BLOOD PRESSURE SUPPORT)
PREFILLED_SYRINGE | INTRAVENOUS | Status: DC | PRN
  Administered 2022-01-02 (×2): 120 ug via INTRAVENOUS
  Administered 2022-01-02 (×3): 80 ug via INTRAVENOUS

## 2022-01-02 MED ORDER — CHLORHEXIDINE GLUCONATE 0.12 % MT SOLN
15.0000 mL | Freq: Once | OROMUCOSAL | Status: AC
Start: 2022-01-02 — End: 2022-01-02
  Administered 2022-01-02: 15 mL via OROMUCOSAL
  Filled 2022-01-02: qty 15

## 2022-01-02 MED ORDER — PROPOFOL 10 MG/ML IV BOLUS
INTRAVENOUS | Status: DC | PRN
  Administered 2022-01-02: 80 mg via INTRAVENOUS

## 2022-01-02 MED ORDER — ACETAMINOPHEN 500 MG PO TABS
1000.0000 mg | ORAL_TABLET | Freq: Once | ORAL | Status: AC
  Administered 2022-01-02: 1000 mg via ORAL
  Filled 2022-01-02: qty 2

## 2022-01-02 MED ORDER — EPHEDRINE SULFATE-NACL 50-0.9 MG/10ML-% IV SOSY
PREFILLED_SYRINGE | INTRAVENOUS | Status: DC | PRN
  Administered 2022-01-02: 5 mg via INTRAVENOUS

## 2022-01-02 MED ORDER — DEXAMETHASONE SODIUM PHOSPHATE 10 MG/ML IJ SOLN
INTRAMUSCULAR | Status: DC | PRN
  Administered 2022-01-02: 10 mg via INTRAVENOUS

## 2022-01-02 MED ORDER — AMISULPRIDE (ANTIEMETIC) 5 MG/2ML IV SOLN: 10.0000 mg | Freq: Once | INTRAVENOUS | Status: DC | PRN

## 2022-01-02 MED ORDER — ROCURONIUM BROMIDE 100 MG/10ML IV SOLN
INTRAVENOUS | Status: DC | PRN
  Administered 2022-01-02: 30 mg via INTRAVENOUS
  Administered 2022-01-02: 20 mg via INTRAVENOUS

## 2022-01-02 MED ORDER — LACTATED RINGERS IV SOLN: INTRAVENOUS | Status: DC

## 2022-01-02 MED ORDER — MIDAZOLAM HCL 5 MG/5ML IJ SOLN
INTRAMUSCULAR | Status: DC | PRN
  Administered 2022-01-02: 2 mg via INTRAVENOUS

## 2022-01-02 MED ORDER — LIDOCAINE 2% (20 MG/ML) 5 ML SYRINGE
INTRAMUSCULAR | Status: DC | PRN
  Administered 2022-01-02: 50 mg via INTRAVENOUS

## 2022-01-02 MED ORDER — FENTANYL CITRATE (PF) 100 MCG/2ML IJ SOLN: 25.0000 ug | INTRAMUSCULAR | Status: DC | PRN

## 2022-01-02 MED ORDER — FENTANYL CITRATE (PF) 100 MCG/2ML IJ SOLN
INTRAMUSCULAR | Status: DC | PRN
  Administered 2022-01-02: 50 ug via INTRAVENOUS

## 2022-01-02 SURGICAL SUPPLY — 1 items: fiducial marker ×1 IMPLANT

## 2022-01-02 NOTE — Transfer of Care (Signed)
Immediate Anesthesia Transfer of Care Note ? ?Patient: [name] ? ?Procedure(s) Performed: ROBOTIC ASSISTED NAVIGATIONAL BRONCHOSCOPY (Right) ?BRONCHIAL BIOPSIES ?BRONCHIAL BRUSHINGS ?FIDUCIAL MARKER PLACEMENT ? ?Patient Location: PACU ? ?Anesthesia Type:General ? ?Level of Consciousness: drowsy and patient cooperative ? ?Airway & Oxygen Therapy: Patient Spontanous Breathing and Patient connected to face mask oxygen ? ?Post-op Assessment: Report given to RN and Post -op Vital signs reviewed and stable ? ?Post vital signs: Reviewed and stable ? ?Last Vitals:  ?Vitals Value Taken Time  ?BP 130/81 01/02/22 1008  ?Temp 36.9 ?C 01/02/22 1010  ?Pulse 84 01/02/22 1018  ?Resp 19 01/02/22 1018  ?SpO2 99 % 01/02/22 1018  ?Vitals shown include unvalidated device data. ? ?Last Pain:  ?Vitals:  ? 01/02/22 1010  ?TempSrc:   ?PainSc: 0-No pain  ?   ? ?  ? ?Complications: No notable events documented. ?

## 2022-01-02 NOTE — Anesthesia Preprocedure Evaluation (Signed)
Anesthesia Evaluation  ?Patient identified by MRN, date of birth, ID band ?Patient awake ? ? ? ?Reviewed: ?Allergy & Precautions, NPO status , Patient's Chart, lab work & pertinent test results ? ?Airway ?Mallampati: II ? ?TM Distance: >3 FB ?Neck ROM: Full ? ? ? Dental ? ?(+) Dental Advisory Given ?  ?Pulmonary ?Current Smoker,  ?  ?breath sounds clear to auscultation ? ? ? ? ? ? Cardiovascular ?negative cardio ROS ? ? ?Rhythm:Regular Rate:Normal ? ? ?  ?Neuro/Psych ?negative neurological ROS ?   ? GI/Hepatic ?negative GI ROS, (+)  ?  ? substance abuse ? alcohol use,   ?Endo/Other  ?negative endocrine ROS ? Renal/GU ?negative Renal ROS  ? ?  ?Musculoskeletal ? ?(+) Arthritis ,  ? Abdominal ?  ?Peds ? Hematology ?negative hematology ROS ?(+)   ?Anesthesia Other Findings ?History of tonsillar cancer s/p radiation and chemotherapy ? Reproductive/Obstetrics ? ?  ? ? ? ? ? ? ? ? ? ? ? ? ? ?  ?  ? ? ? ? ? ? ? ? ?Lab Results  ?Component Value Date  ? WBC 2.5 (L) 09/05/2021  ? HGB 11.8 (L) 09/05/2021  ? HCT 36.7 (L) 09/05/2021  ? MCV 91.1 09/05/2021  ? PLT 94.0 (L) 09/05/2021  ? ?Lab Results  ?Component Value Date  ? CREATININE 0.74 09/05/2021  ? BUN 8 09/05/2021  ? NA 138 09/05/2021  ? K 4.1 09/05/2021  ? CL 99 09/05/2021  ? CO2 30 09/05/2021  ? ? ?Anesthesia Physical ?Anesthesia Plan ? ?ASA: 3 ? ?Anesthesia Plan: General  ? ?Post-op Pain Management: Tylenol PO (pre-op)* and Minimal or no pain anticipated  ? ?Induction: Intravenous ? ?PONV Risk Score and Plan: 1 and Dexamethasone, Ondansetron and Treatment may vary due to age or medical condition ? ?Airway Management Planned: Oral ETT ? ?Additional Equipment: None ? ?Intra-op Plan:  ? ?Post-operative Plan: Extubation in OR ? ?Informed Consent: I have reviewed the patients History and Physical, chart, labs and discussed the procedure including the risks, benefits and alternatives for the proposed anesthesia with the patient or authorized  representative who has indicated his/her understanding and acceptance.  ? ? ? ?Dental advisory given ? ?Plan Discussed with: CRNA ? ?Anesthesia Plan Comments:   ? ? ? ? ? ? ?Anesthesia Quick Evaluation ? ?

## 2022-01-02 NOTE — Discharge Instructions (Signed)
Flexible Bronchoscopy, Care After ?This sheet gives you information about how to care for yourself after your test. Your doctor may also give you more specific instructions. If you have problems or questions, contact your doctor. ?Follow these instructions at home: ?Eating and drinking ?Do not eat or drink anything (not even water) for 2 hours after your test, or until your numbing medicine (local anesthetic) wears off. ?When your numbness is gone and your cough and gag reflexes have come back, you may: ?Eat only soft foods. ?Slowly drink liquids. ?The day after the test, go back to your normal diet. ?Driving ?Do not drive for 24 hours if you were given a medicine to help you relax (sedative). ?Do not drive or use heavy machinery while taking prescription pain medicine. ?General instructions ? ?Take over-the-counter and prescription medicines only as told by your doctor. ?Return to your normal activities as told. Ask what activities are safe for you. ?Do not use any products that have nicotine or tobacco in them. This includes cigarettes and e-cigarettes. If you need help quitting, ask your doctor. ?Keep all follow-up visits as told by your doctor. This is important. It is very important if you had a tissue sample (biopsy) taken. ?Get help right away if: ?You have shortness of breath that gets worse. ?You get light-headed. ?You feel like you are going to pass out (faint). ?You have chest pain. ?You cough up: ?More than a little blood. ?More blood than before. ?Summary ?Do not eat or drink anything (not even water) for 2 hours after your test, or until your numbing medicine wears off. ?Do not use cigarettes. Do not use e-cigarettes. ?Get help right away if you have chest pain. ? ?This information is not intended to replace advice given to you by your health care provider. Make sure you discuss any questions you have with your health care provider. ?Document Released: 08/02/2009 Document Revised: 09/17/2017 Document  Reviewed: 10/23/2016 ?Elsevier Patient Education ? [place]. ? ? ?

## 2022-01-02 NOTE — Anesthesia Postprocedure Evaluation (Signed)
Anesthesia Post Note ? ?Patient: [name] ? ?Procedure(s) Performed: ROBOTIC ASSISTED NAVIGATIONAL BRONCHOSCOPY (Right) ?BRONCHIAL BIOPSIES ?BRONCHIAL BRUSHINGS ?FIDUCIAL MARKER PLACEMENT ? ?  ? ?Patient location during evaluation: PACU ?Anesthesia Type: General ?Level of consciousness: awake and alert ?Pain management: pain level controlled ?Vital Signs Assessment: post-procedure vital signs reviewed and stable ?Respiratory status: spontaneous breathing, nonlabored ventilation, respiratory function stable and patient connected to nasal cannula oxygen ?Cardiovascular status: blood pressure returned to baseline and stable ?Postop Assessment: no apparent nausea or vomiting ?Anesthetic complications: no ? ? ?No notable events documented. ? ?Last Vitals:  ?Vitals:  ? 01/02/22 1025 01/02/22 1040  ?BP: 125/80 123/80  ?Pulse: 79 73  ?Resp: 20 14  ?Temp:  36.9 ?C  ?SpO2: 98% 97%  ?  ?Last Pain:  ?Vitals:  ? 01/02/22 1040  ?TempSrc:   ?PainSc: 0-No pain  ? ? ?  ?  ?  ?  ?  ?  ? ?Suzette Battiest E ? ? ? ? ?

## 2022-01-02 NOTE — H&P (Signed)
?  ?Synopsis: Referred in January 2023 for lung nodule by Nolene Ebbs, MD ?  ?Subjective:  ?  ?PATIENT ID: [name] GENDER: male DOB: 22-Jun-1961, MRN: [id number] ?  ?    ?Chief Complaint  ?Patient presents with  ? Consult  ?    Patient here to talk about ct  ?  ?  ?This is a 61 year old gentleman, past medical history of left tonsillar cancer, history of chemotherapy history of radiation, occasional alcohol use, past history of cocaine use denied current use, patient found to have left tonsil and bilateral neck disease with radiation treatments by Dr. Isidore Moos.  CT image staging revealed a pulmonary nodule That was initially found in July 2022.  Recommended subsequent CT scan follow-up.  Patient here after follow-up CT imaging on 11/06/2021 which revealed a similar interval increase in size of the sup solid nodule located in the right upper lobe concerning for a slow-growing indolent neoplasm.  Patient is still smoking 1 pack/day and drinking 3 beers per day. ? ?01/02/2022: Here today for outpatient robotic assisted navigational bronchoscopy. ?  ?  ?    ?Past Medical History:  ?Diagnosis Date  ? Alcoholism ([place])    ? Arthritis    ? Cancer [facility])    ?  left tonsil cancer  ? History of chemotherapy    ? History of radiation therapy 09/01/17- 10/25/17  ?  Left tonsil and bilateral neck 70 Gy in 35 fractions to gross disease, 63 gy in 35 fractions to high risk nodal echelons, and 56 Gy in 35 fraction to intermediate risk nodal echelons.   ?  ?  ?     ?Family History  ?Problem Relation Age of Onset  ? Other Mother    ?      pre-diabetes  ? Colon cancer Neg Hx    ? Esophageal cancer Neg Hx    ? Liver cancer Neg Hx    ? Pancreatic cancer Neg Hx    ? Rectal cancer Neg Hx    ? Stomach cancer Neg Hx    ?  ?  ?     ?Past Surgical History:  ?Procedure Laterality Date  ? COLONOSCOPY      ? IR FLUORO GUIDE PORT INSERTION RIGHT   09/13/2017  ? IR GASTROSTOMY TUBE MOD SED   09/13/2017  ? IR GASTROSTOMY TUBE REMOVAL   03/28/2018   ? IR REMOVAL TUN ACCESS W/ PORT W/O FL MOD SED   03/28/2018  ? IR US GUIDE VASC ACCESS RIGHT   09/13/2017  ? MULTIPLE EXTRACTIONS WITH ALVEOLOPLASTY N/A 08/09/2017  ?  Procedure: Extraction of tooth #'s 1-6, 11-17,21,22,26-30 and 32 with alveoloplasty and bilateral mandibular tori reductions;  Surgeon: Lenn Cal, DDS;  Location: [facility];  Service: Oral Surgery;  Laterality: N/A;  ? tonsil biopsy      ?  left tonsil  ?  ?  ?Social History  ?  ?     ?Socioeconomic History  ? Marital status: Single  ?    Spouse name: Not on file  ? Number of children: 1  ? Years of education: Not on file  ? Highest education level: Not on file  ?Occupational History  ? Occupation: Disabled  ?Tobacco Use  ? Smoking status: Light Smoker  ?    Packs/day: 1.00  ?    Years: 30.00  ?    Pack years: 30.00  ?    Types: Cigarettes  ? Smokeless tobacco: Never  ? Tobacco  comments:  ?    Tobacco info given  ?Vaping Use  ? Vaping Use: Never used  ?Substance and Sexual Activity  ? Alcohol use: Yes  ?    Alcohol/week: 7.0 standard drinks  ?    Types: 7 Cans of beer per week  ?    Comment: 1 pint daily or more  ? Drug use: No  ?    Types: Cocaine  ?    Comment: he has a past history of cocaine abuse, he denies current use  ? Sexual activity: Not on file  ?Other Topics Concern  ? Not on file  ?Social History Narrative  ? Not on file  ?  ?Social Determinants of Health  ?  ?Financial Resource Strain: Not on file  ?Food Insecurity: Not on file  ?Transportation Needs: Not on file  ?Physical Activity: Not on file  ?Stress: Not on file  ?Social Connections: Not on file  ?Intimate Partner Violence: Not on file  ?  ?  ?Not on File  ?  ?No outpatient medications prior to visit.  ?  ?No facility-administered medications prior to visit.  ?  ?  ?Review of Systems  ?Constitutional:  Negative for chills, fever, malaise/fatigue and weight loss.  ?HENT:  Negative for hearing loss, sore throat and tinnitus.   ?Eyes:  Negative for blurred vision and double  vision.  ?Respiratory:  Negative for cough, hemoptysis, sputum production, shortness of breath, wheezing and stridor.   ?Cardiovascular:  Negative for chest pain, palpitations, orthopnea, leg swelling and PND.  ?Gastrointestinal:  Negative for abdominal pain, constipation, diarrhea, heartburn, nausea and vomiting.  ?Genitourinary:  Negative for dysuria, hematuria and urgency.  ?Musculoskeletal:  Negative for joint pain and myalgias.  ?Skin:  Negative for itching and rash.  ?Neurological:  Negative for dizziness, tingling, weakness and headaches.  ?Endo/Heme/Allergies:  Negative for environmental allergies. Does not bruise/bleed easily.  ?Psychiatric/Behavioral:  Negative for depression. The patient is not nervous/anxious and does not have insomnia.   ?All other systems reviewed and are negative.  ?  ?  ?Objective:  ? ?General appearance: 61 y.o., male, NAD, conversant  ?Eyes: anicteric sclerae, moist conjunctivae; no lid-lag; PERRLA, tracking appropriately ?HENT: NCAT; oropharynx, MMM, no mucosal ulcerations; normal hard and soft palate ?Neck: Trachea midline; FROM, supple, lymphadenopathy, no JVD ?Lungs: CTAB, no crackles, no wheeze, with normal respiratory effort and no intercostal retractions ?CV: RRR, S1, S2, no MRGs  ?Abdomen: Soft, non-tender; non-distended, BS present  ?Extremities: No peripheral edema, radial and DP pulses present bilaterally  ?Skin: Normal temperature, turgor and texture; no rash ?Psych: Appropriate affect ?Neuro: Alert and oriented to person and place, no focal deficit   ?  ?  ?  ?   ?Vitals:  ?  11/13/21 1351  ?BP: 96/62  ?Pulse: 63  ?Temp: 97.7 ?F (36.5 ?C)  ?TempSrc: Oral  ?SpO2: 100%  ?Weight: 122 lb 9.6 oz (55.6 kg)  ?Height: 5\' 7"  (1.702 m)  ?  ?100% on RA ?   ?BMI Readings from Last 3 Encounters:  ?11/13/21 19.20 kg/m?  ?09/18/21 18.25 kg/m?  ?09/05/21 18.25 kg/m?  ?  ?   ?Wt Readings from Last 3 Encounters:  ?11/13/21 122 lb 9.6 oz (55.6 kg)  ?09/18/21 120 lb (54.4 kg)  ?09/05/21  120 lb (54.4 kg)  ?  ?  ?  ?CBC ?Labs (Brief)  ?     ?   ?Component Value Date/Time  ?  WBC 2.5 (L) 09/05/2021 1211  ?  RBC 4.02 (  L) 09/05/2021 1211  ?  HGB 11.8 (L) 09/05/2021 1211  ?  HGB 12.1 (L) 12/28/2018 1534  ?  HGB 10.0 (L) 10/22/2017 1422  ?  HCT 36.7 (L) 09/05/2021 1211  ?  HCT 30.1 (L) 10/22/2017 1422  ?  PLT 94.0 (L) 09/05/2021 1211  ?  PLT 92 (L) 12/28/2018 1534  ?  PLT 216 10/22/2017 1422  ?  MCV 91.1 09/05/2021 1211  ?  MCV 87.0 10/22/2017 1422  ?  MCH 28.5 12/28/2018 1534  ?  MCHC 32.3 09/05/2021 1211  ?  RDW 15.8 (H) 09/05/2021 1211  ?  RDW 13.9 10/22/2017 1422  ?  LYMPHSABS 0.6 (L) 09/05/2021 1211  ?  LYMPHSABS 0.2 (L) 10/22/2017 1422  ?  MONOABS 0.5 09/05/2021 1211  ?  MONOABS 0.7 10/22/2017 1422  ?  EOSABS 0.0 09/05/2021 1211  ?  EOSABS 0.0 10/22/2017 1422  ?  BASOSABS 0.0 09/05/2021 1211  ?  BASOSABS 0.0 10/22/2017 1422  ?  ?  ?  ?  ?Chest Imaging: ?CT chest 11/06/2021: ?Right upper lobe sup solid pulmonary nodule slowly enlarging over time. ?Concerning for a indolent neoplasm. ?The patient's images have been independently reviewed by me.   ?  ?Pulmonary Functions Testing Results: ?No flowsheet data found. ?  ?FeNO:  ?  ?Pathology:  ?  ?Echocardiogram:  ?  ?Heart Catheterization:  ?   ?Assessment & Plan:  ?  ?    ICD-10-CM    ?1. Right upper lobe pulmonary nodule  R91.1 Procedural/ Surgical Case Request: ROBOTIC ASSISTED NAVIGATIONAL BRONCHOSCOPY  ?    Ambulatory referral to Pulmonology  ?   ?2. History of head and neck cancer  Z85.89    ?   ?3. 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    ?   ?4. Alcohol abuse  F10.10    ?   ?5. Tobacco abuse  Z72.0    ?   ?  ?  ?Discussion: ?  ?This is a 61 year old gentleman longstanding history of tobacco abuse, daily smoker still 1 pack/day, daily alcohol use approximately 3 beers per day.  History of tonsillar cancer with a slowly enlarging subsolid nodule in the right upper lobe. ? ?Here today for biopsy of enlarging subsolid lung nodule. ? ?Plan: ?Discussed  risk benefits and alternatives of robotic assisted bronchoscopy with navigation. ?We discussed the risk of bleeding and pneumothorax. ?Patient is agreeable to proceed. ? ?Garner Nash, DO ?[facility]

## 2022-01-02 NOTE — Interval H&P Note (Signed)
History and Physical Interval Note: ? ?01/02/2022 ?9:05 AM ? ?[name]  has presented today for surgery, with the diagnosis of LUNG NODULE RIGHT.  The various methods of treatment have been discussed with the patient and family. After consideration of risks, benefits and other options for treatment, the patient has consented to  Procedure(s): ?ROBOTIC ASSISTED NAVIGATIONAL BRONCHOSCOPY (Right) as a surgical intervention.  The patient's history has been reviewed, patient examined, no change in status, stable for surgery.  I have reviewed the patient's chart and labs.  Questions were answered to the patient's satisfaction.   ? ? ?[name] ? ? ?

## 2022-01-02 NOTE — Anesthesia Procedure Notes (Signed)
Procedure Name: Intubation ?Date/Time: 01/02/2022 9:17 AM ?Performed by: Gwyndolyn Saxon, CRNA ?Pre-anesthesia Checklist: Patient identified, Emergency Drugs available, Suction available and Patient being monitored ?Patient Re-evaluated:Patient Re-evaluated prior to induction ?Oxygen Delivery Method: Circle system utilized ?Preoxygenation: Pre-oxygenation with 100% oxygen ?Induction Type: IV induction ?Ventilation: Mask ventilation without difficulty ?Laryngoscope Size: Sabra Heck and 2 ?Grade View: Grade II ?Tube type: Oral ?Tube size: 8.5 mm ?Number of attempts: 1 ?Airway Equipment and Method: Patient positioned with wedge pillow and Stylet ?Placement Confirmation: ETT inserted through vocal cords under direct vision, positive ETCO2 and breath sounds checked- equal and bilateral ?Secured at: 23 cm ?Tube secured with: Tape ?Dental Injury: Teeth and Oropharynx as per pre-operative assessment  ? ? ? ? ?

## 2022-01-02 NOTE — Op Note (Addendum)
Video Bronchoscopy with Robotic Assisted Bronchoscopic Navigation  ? ?Date of Operation: 01/02/2022  ? ?Pre-op Diagnosis: Right upper lobe pulmonary nodule ? ?Post-op Diagnosis: Right upper lobe pulmonary nodule ? ?Surgeon: Garner Nash, DO ? ?Assistants: None  ? ?Anesthesia: General endotracheal anesthesia ? ?Operation: Flexible video fiberoptic bronchoscopy with robotic assistance and biopsies. ? ?Estimated Blood Loss: Minimal ? ?Complications: None ? ?Indications and History: ?[name] is a 61 y.o. male with history of RUL . The risks, benefits, complications, treatment options and expected outcomes were discussed with the patient.  The possibilities of pneumothorax, pneumonia, reaction to medication, pulmonary aspiration, perforation of a viscus, bleeding, failure to diagnose a condition and creating a complication requiring transfusion or operation were discussed with the patient who freely signed the consent.   ? ?Description of Procedure: ?The patient was seen in the Preoperative Area, was examined and was deemed appropriate to proceed.  The patient was taken to [facility] endoscopy room 3, identified as [name] and the procedure verified as Flexible Video Fiberoptic Bronchoscopy.  A Time Out was held and the above information confirmed.  ? ?Prior to the date of the procedure a high-resolution CT scan of the chest was performed. Utilizing ION software program a virtual tracheobronchial tree was generated to allow the creation of distinct navigation pathways to the patient's parenchymal abnormalities. After being taken to the operating room general anesthesia was initiated and the patient  was orally intubated. The video fiberoptic bronchoscope was introduced via the endotracheal tube and a general inspection was performed which showed normal right and left lung anatomy, aspiration of the bilateral mainstems was completed to remove any remaining secretions. Robotic catheter inserted into patient's  endotracheal tube.  ? ?Target #1 right upper lobe subsolid lesion: ?The distinct navigation pathways prepared prior to this procedure were then utilized to navigate to patient's lesion identified on CT scan. The robotic catheter was secured into place and the vision probe was withdrawn.  Lesion location was approximated using fluoroscopy, three-dimensional cone beam CT imaging and radial endobronchial ultrasound for peripheral targeting. Under fluoroscopic guidance transbronchial needle brushings, and transbronchial forceps biopsies were performed to be sent for cytology and pathology.  Following tissue sampling a single fiducial was placed in proximity of the lesion with a fiducial catheter wire and delivery kit under direct fluoroscopy.   ? ?At the end of the procedure a general airway inspection was performed and there was no evidence of active bleeding. The bronchoscope was removed.  The patient tolerated the procedure well. There was no significant blood loss and there were no obvious complications. A post-procedural chest x-ray is pending. ? ?Samples Target #1: ?1. Transbronchial needle brushings from right upper lobe ?2. Transbronchial forceps biopsies from right upper lobe ? ?Plans:  ?The patient will be discharged from the PACU to home when recovered from anesthesia and after chest x-ray is reviewed. We will review the cytology, pathology results with the patient when they become available. Outpatient followup will be with Christian Graves Ysabelle Goodroe, DO. ? ?Garner Nash, DO ?[facility] ?01/02/2022 10:18 AM     ?

## 2022-01-05 ENCOUNTER — Encounter (HOSPITAL_COMMUNITY): Payer: Self-pay | Admitting: Pulmonary Disease

## 2022-01-06 LAB — CYTOLOGY - NON PAP

## 2022-01-06 NOTE — Progress Notes (Signed)
[name],  ? 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 I called and spoke to his son. Do you want to see him back and discuss SBRT? ? ?Thanks, ? ?Brad  ? ?Garner Nash, DO ?[facility] ?01/06/2022 4:02 PM  ?

## 2022-01-07 ENCOUNTER — Other Ambulatory Visit: Payer: Self-pay

## 2022-01-07 DIAGNOSIS — C090 Malignant neoplasm of tonsillar fossa: Secondary | ICD-10-CM

## 2022-01-07 DIAGNOSIS — R911 Solitary pulmonary nodule: Secondary | ICD-10-CM

## 2022-01-09 ENCOUNTER — Telehealth: Payer: Self-pay | Admitting: Pulmonary Disease

## 2022-01-09 ENCOUNTER — Other Ambulatory Visit: Payer: Self-pay

## 2022-01-09 ENCOUNTER — Ambulatory Visit (INDEPENDENT_AMBULATORY_CARE_PROVIDER_SITE_OTHER): Payer: Medicaid Other | Admitting: Pulmonary Disease

## 2022-01-09 ENCOUNTER — Other Ambulatory Visit: Payer: Self-pay | Admitting: Pulmonary Disease

## 2022-01-09 DIAGNOSIS — R911 Solitary pulmonary nodule: Secondary | ICD-10-CM

## 2022-01-09 LAB — PULMONARY FUNCTION TEST
DL/VA % pred: 85 %
DL/VA: 3.7 ml/min/mmHg/L
DLCO cor % pred: 73 %
DLCO cor: 18.65 ml/min/mmHg
DLCO unc % pred: 70 %
DLCO unc: 17.81 ml/min/mmHg
FEF 25-75 Post: 1.75 L/sec
FEF 25-75 Pre: 1.28 L/sec
FEF2575-%Change-Post: 37 %
FEF2575-%Pred-Post: 65 %
FEF2575-%Pred-Pre: 47 %
FEV1-%Change-Post: 49 %
FEV1-%Pred-Post: 99 %
FEV1-%Pred-Pre: 66 %
FEV1-Post: 2.79 L
FEV1-Pre: 1.87 L
FEV1FVC-%Change-Post: 62 %
FEV1FVC-%Pred-Pre: 60 %
FEV6-%Change-Post: -8 %
FEV6-%Pred-Post: 105 %
FEV6-%Pred-Pre: 114 %
FEV6-Post: 3.64 L
FEV6-Pre: 3.96 L
FEV6FVC-%Pred-Post: 103 %
FEV6FVC-%Pred-Pre: 103 %
FVC-%Change-Post: -8 %
FVC-%Pred-Post: 101 %
FVC-%Pred-Pre: 110 %
FVC-Post: 3.64 L
FVC-Pre: 3.96 L
Post FEV1/FVC ratio: 77 %
Post FEV6/FVC ratio: 100 %
Pre FEV1/FVC ratio: 47 %
Pre FEV6/FVC Ratio: 100 %
RV % pred: 113 %
RV: 2.34 L
TLC % pred: 93 %
TLC: 5.98 L

## 2022-01-09 NOTE — Telephone Encounter (Signed)
pt sister looking to get results of Bronch and PFT. States unsure if someone has already reach out to pt about Bronch. Advise pt sister that DPR must be filled out before anyone will speak to her. Pt will fill out updated DPR today before leaving  [phone] ?

## 2022-01-09 NOTE — Patient Instructions (Signed)
Full PFT performed today. °

## 2022-01-09 NOTE — Telephone Encounter (Signed)
Duplicate phone encounter. Closing this one. Please refer back to phone encounter from 3/24. ?

## 2022-01-09 NOTE — Progress Notes (Signed)
Full PFT performed today. °

## 2022-01-09 NOTE — Telephone Encounter (Signed)
I called the patient and sister and had to leave messages on the results.  ? ?Since Dr. Valeta Harms is out of office, Judson Roch  please advise on results of Bronch.  ?

## 2022-01-13 NOTE — Telephone Encounter (Signed)
Noted by triage.  ? ?Lm for patient to make him aware.  ? ?

## 2022-01-14 ENCOUNTER — Telehealth: Payer: Self-pay | Admitting: *Deleted

## 2022-01-14 ENCOUNTER — Telehealth: Payer: Self-pay | Admitting: Acute Care

## 2022-01-14 NOTE — Telephone Encounter (Signed)
Will close this phone encounter as there is a separate encounter that was created by SG today 3/29. ?

## 2022-01-14 NOTE — Telephone Encounter (Signed)
CALLED PATIENT TO INFORM OF PET SCAN FOR 01/26/22 - ARRIVAL TIME- 9 AM @ [facility], PATIENT TO HAVE WATER ONLY- 6 HRS. PRIOR TO TEST, LVM FOR A  RETURN CALL ?

## 2022-01-14 NOTE — Telephone Encounter (Signed)
I have attempted to call the patient with the results of his biopsy done 317 by Dr. Valeta Harms.  There was no answer at either his number or his sister's number who is on his DPR.  I left a HIPAA compliant message requesting that the patient call the office so that we can go over the results of the biopsy.  The patient has an appointment for April 4 however did not feel comfortable waiting until then to know the results of the biopsy.  Per the patient request we will relay the results of the biopsy over the phone but will also follow-up with an in person appointment on April 4. ?

## 2022-01-15 NOTE — Telephone Encounter (Signed)
[name] sister states she got the message about the patient. [name] phone number is [phone]. ?

## 2022-01-15 NOTE — Telephone Encounter (Signed)
I returned the call to Christiana Pellant, Mr. Rathmann's sister. She is on the patient's DPR. She is also a Marine scientist. I explained that the biopsy from the Right upper lobe done 01/02/2022 were positive for adenocarcinoma. We discussed that there is also a smaller area of concern in the left upper lobe that we are following. I explained that the patient has an appointment with Dr. Roxan Hockey with thoracic surgery to be evaluated. I explained that his PFT's have been done, and that he really needs to work on quitting smoking as soon as possible. I gave her the 1-800-QUIT NOW number for free nicotine patches gum and mints so he can get started right away. ?Doroteo Bradford will try to make the appointment with her brother on 4/4 as I think he will benefit from having her there as support, and as a second set of ears to explain treatment options.  ?I told her to call the office if there were any other questions or concerns. They are in good hands with Dr. Roxan Hockey.  ?

## 2022-01-15 NOTE — Telephone Encounter (Signed)
I have attempted to call both  the patient ( Cell and home phone) and his sister [name] at the number she gave [place] to call. There was no answer at all 3 total numbers. I have left a HIPPA compliant message on both VM with the office contact number, requesting the patient call the office for the biopsy results he requested.  ?He does have an appointment scheduled with Dr. Roxan Hockey for 4/4 and a PET scan scheduled for 4/10. He is a current every day smoker . PFT's done 01/09/2022 show FEV1 of 1.87, + for obstructive disease , DLCO of 70% with excellent BD response noted. ( Waveforms are a mix of clear obstruction and less obvious obstruction, first test pre and post BD look less severely obstructed )  ?

## 2022-01-20 ENCOUNTER — Other Ambulatory Visit: Payer: Self-pay | Admitting: *Deleted

## 2022-01-20 ENCOUNTER — Institutional Professional Consult (permissible substitution) (INDEPENDENT_AMBULATORY_CARE_PROVIDER_SITE_OTHER): Payer: Medicaid Other | Admitting: Thoracic Surgery (Cardiothoracic Vascular Surgery)

## 2022-01-20 ENCOUNTER — Encounter: Payer: Self-pay | Admitting: *Deleted

## 2022-01-20 VITALS — BP 118/83 | HR 90 | Resp 20 | Ht 67.0 in | Wt 120.0 lb

## 2022-01-20 DIAGNOSIS — R911 Solitary pulmonary nodule: Secondary | ICD-10-CM | POA: Diagnosis not present

## 2022-01-20 NOTE — Progress Notes (Signed)
PCP is Nolene Ebbs, MD ?Referring Provider is Garner Nash, DO ? ?Chief Complaint  ?Patient presents with  ? Lung Lesion  ?  Initial surgical consult, Bronch 3/17, CT chest 1/19, PFT 3/24  ? ? ?HPI: 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 ? ?[name] is a 61 year old gentleman with a history of tobacco abuse, alcoholism, arthritis, and left tonsillar cancer.  He was treated with chemoradiation for his tonsillar cancer several years ago.  He was found to have a right upper lobe lung nodule in July 2022.  This was a subsolid nodule.  A follow-up CT in January showed an interval increase in size.  Dr. Valeta Harms performed navigational bronchoscopy on 01/02/2022.  Cytology showed adenocarcinoma. ? ?He also has a smaller subsolid nodule in the left upper lobe anteriorly.  That has not changed in size. ? ?He denies any trouble swallowing.  He denies chest pain, pressure, tightness, or shortness of breath.  He rides a bike frequently.  He continues to smoke a pack of cigarettes daily.  He also drinks about a sixpack of beer a day. ? ?Zubrod Score: ?At the time of surgery this patient?s most appropriate activity status/level should be described as: ?[x]     0    Normal activity, no symptoms ?[]     1    Restricted in physical strenuous activity but ambulatory, able to do out light work ?[]     2    Ambulatory and capable of self care, unable to do work activities, up and about >50 % of waking hours                              ?[]     3    Only limited self care, in bed greater than 50% of waking hours ?[]     4    Completely disabled, no self care, confined to bed or chair ?[]     5    Moribund ? ?Past Medical History:  ?Diagnosis Date  ? Alcoholism ([place])   ? Arthritis   ? Cancer [facility])   ? left tonsil cancer  ? History of chemotherapy   ? History of radiation therapy 09/01/17- 10/25/17  ? Left tonsil and bilateral neck 70 Gy in 35 fractions to gross disease, 63 gy in 35  fractions to high risk nodal echelons, and 56 Gy in 35 fraction to intermediate risk nodal echelons.   ? ? ?Past Surgical History:  ?Procedure Laterality Date  ? BRONCHIAL BIOPSY  01/02/2022  ? Procedure: BRONCHIAL BIOPSIES;  Surgeon: Garner Nash, DO;  Location: [place] [facility];  Service: Pulmonary;;  ? BRONCHIAL BRUSHINGS  01/02/2022  ? Procedure: BRONCHIAL BRUSHINGS;  Surgeon: Garner Nash, DO;  Location: [place] [facility];  Service: Pulmonary;;  ? COLONOSCOPY    ? FIDUCIAL MARKER PLACEMENT  01/02/2022  ? Procedure: FIDUCIAL MARKER PLACEMENT;  Surgeon: Garner Nash, DO;  Location: [place] [facility];  Service: Pulmonary;;  ? IR FLUORO GUIDE PORT INSERTION RIGHT  09/13/2017  ? IR GASTROSTOMY TUBE MOD SED  09/13/2017  ? IR GASTROSTOMY TUBE REMOVAL  03/28/2018  ? IR REMOVAL TUN ACCESS W/ PORT W/O FL MOD SED  03/28/2018  ? IR US GUIDE VASC ACCESS RIGHT  09/13/2017  ? MULTIPLE EXTRACTIONS WITH ALVEOLOPLASTY N/A 08/09/2017  ? Procedure: Extraction of tooth #'s 1-6, 11-17,21,22,26-30 and 32 with alveoloplasty and bilateral mandibular tori reductions;  Surgeon: Lenn Cal, DDS;  Location: [facility];  Service: Oral Surgery;  Laterality: N/A;  ? tonsil biopsy    ? left tonsil  ? ? ?Family History  ?Problem Relation Age of Onset  ? Other Mother   ?     pre-diabetes  ? Colon cancer Neg Hx   ? Esophageal cancer Neg Hx   ? Liver cancer Neg Hx   ? Pancreatic cancer Neg Hx   ? Rectal cancer Neg Hx   ? Stomach cancer Neg Hx   ? ? ?Social History ?Social History  ? ?Tobacco Use  ? Smoking status: Every Day  ?  Packs/day: 0.25  ?  Years: 30.00  ?  Pack years: 7.50  ?  Types: Cigarettes  ? Smokeless tobacco: Never  ? Tobacco comments:  ?  Tobacco info given  ?Vaping Use  ? Vaping Use: Never used  ?Substance Use Topics  ? Alcohol use: Yes  ?  Alcohol/week: 7.0 standard drinks  ?  Types: 7 Cans of beer per week  ?  Comment: 1 pint daily or more  ? Drug use: Yes  ?  Types: Cocaine, Marijuana  ?  Comment: he has a past history of  cocaine abuse, he denies current use  ? ? ?No current outpatient medications on file.  ? ?No current facility-administered medications for this visit.  ? ?Facility-Administered Medications Ordered in Other Visits  ?Medication Dose Route Frequency Provider Last Rate Last Admin  ? lactated ringers infusion   Intravenous Continuous [name], MD      ? ? ?No Known Allergies ? ?Review of Systems  ?Constitutional:  Negative for activity change, appetite change and unexpected weight change.  ?HENT:  Negative for trouble swallowing and voice change.   ?Respiratory:  Negative for shortness of breath and wheezing.   ?Cardiovascular:  Negative for chest pain and leg swelling.  ?Gastrointestinal:  Negative for abdominal distention and abdominal pain.  ?Genitourinary:  Negative for difficulty urinating and dysuria.  ?Musculoskeletal:  Negative for arthralgias and myalgias.  ?Neurological:  Negative for seizures, syncope and weakness.  ?Hematological:  Negative for adenopathy. Does not bruise/bleed easily.  ?All other systems reviewed and are negative. ? ?BP 118/83 (BP Location: Right Arm, Patient Position: Sitting)   Pulse 90   Resp 20   Ht 5\' 7"  (1.702 m)   Wt 120 lb (54.4 kg)   SpO2 99% Comment: RA  BMI 18.79 kg/m?  ?Physical Exam ?Vitals reviewed.  ?Constitutional:   ?   General: He is not in acute distress. ?   Appearance: Normal appearance.  ?HENT:  ?   Head: Normocephalic and atraumatic.  ?Eyes:  ?   General: No scleral icterus. ?   Extraocular Movements: Extraocular movements intact.  ?Neck:  ?   Vascular: No carotid bruit.  ?Cardiovascular:  ?   Rate and Rhythm: Normal rate and regular rhythm.  ?   Pulses: Normal pulses.  ?   Heart sounds: Normal heart sounds. No murmur heard. ?  No friction rub. No gallop.  ?Pulmonary:  ?   Effort: Pulmonary effort is normal. No respiratory distress.  ?   Breath sounds: Normal breath sounds. No wheezing or rales.  ?Abdominal:  ?   General: There is no distension.  ?    Palpations: Abdomen is soft.  ?Skin: ?   General: Skin is warm and dry.  ?Neurological:  ?   General: No focal deficit present.  ?   Mental Status: He is alert and oriented to person, place, and  time.  ?   Cranial Nerves: No cranial nerve deficit.  ?   Motor: No weakness.  ? ? ?Diagnostic Tests: ?CT CHEST WITHOUT CONTRAST ?  ?TECHNIQUE: ?Multidetector CT imaging of the chest was performed using thin slice ?collimation for electromagnetic bronchoscopy planning purposes, ?without intravenous contrast. ?  ?RADIATION DOSE REDUCTION: This exam was performed according to the ?departmental dose-optimization program which includes automated ?exposure control, adjustment of the mA and/or kV according to ?patient size and/or use of iterative reconstruction technique. ?  ?COMPARISON:  Chest CT 05/02/2021. ?  ?FINDINGS: ?Cardiovascular: Normal heart size. No pericardial effusion. Thoracic ?aortic and coronary arterial vascular calcifications. ?  ?Mediastinum/Nodes: No enlarged axillary, mediastinal or hilar ?lymphadenopathy. Normal appearance of the esophagus. ?  ?Lungs/Pleura: Central airways are patent. Centrilobular and ?paraseptal emphysematous changes. No large area of pulmonary ?consolidation. No pleural effusion or pneumothorax. Biapical ?scarring. ?  ?Subsolid nodule within the right upper lobe (image 51; series 5) ?measures 1.5 x 1.0 x 0.9 cm, previously 1.4 x 0.9 x 0.9 Cm. Overall ?this appears similar to minimally increased in size when compared to ?prior exam. ?  ?Ground-glass nodule within the anterior left upper lobe measures 5 x ?5 mm, previously 5 x 6 mm, not significantly changed from prior ?(image 51; series 5). ?  ?Probable scarring in the medial right upper lobe and lingula not ?significantly changed from prior exams. ?  ?Upper Abdomen: No acute abnormality. ?  ?Musculoskeletal: No aggressive or acute appearing osseous lesions. ?  ?IMPRESSION: ?1. Similar to minimal interval increase in size of subsolid  nodule ?within the right upper lobe when compared to prior exams. As ?discussed previously, given the mild interval increase in size over ?the prior years, possibility of a slow-growing low-grade ?adenocarcinoma is not entir

## 2022-01-20 NOTE — H&P (View-Only) (Signed)
PCP is Nolene Ebbs, MD ?Referring Provider is Garner Nash, DO ? ?Chief Complaint  ?Patient presents with  ? Lung Lesion  ?  Initial surgical consult, Bronch 3/17, CT chest 1/19, PFT 3/24  ? ? ?HPI: [name] is sent for consultation regarding an adenocarcinoma of the right upper lobe. ? ?[name] is a 61 year old gentleman with a history of tobacco abuse, alcoholism, arthritis, and left tonsillar cancer.  He was treated with chemoradiation for his tonsillar cancer several years ago.  He was found to have a right upper lobe lung nodule in July 2022.  This was a subsolid nodule.  A follow-up CT in January showed an interval increase in size.  Dr. Valeta Harms performed navigational bronchoscopy on 01/02/2022.  Cytology showed adenocarcinoma. ? ?He also has a smaller subsolid nodule in the left upper lobe anteriorly.  That has not changed in size. ? ?He denies any trouble swallowing.  He denies chest pain, pressure, tightness, or shortness of breath.  He rides a bike frequently.  He continues to smoke a pack of cigarettes daily.  He also drinks about a sixpack of beer a day. ? ?Zubrod Score: ?At the time of surgery this patient?s most appropriate activity status/level should be described as: ?[x]     0    Normal activity, no symptoms ?[]     1    Restricted in physical strenuous activity but ambulatory, able to do out light work ?[]     2    Ambulatory and capable of self care, unable to do work activities, up and about >50 % of waking hours                              ?[]     3    Only limited self care, in bed greater than 50% of waking hours ?[]     4    Completely disabled, no self care, confined to bed or chair ?[]     5    Moribund ? ?Past Medical History:  ?Diagnosis Date  ? Alcoholism ([place])   ? Arthritis   ? Cancer [facility])   ? left tonsil cancer  ? History of chemotherapy   ? History of radiation therapy 09/01/17- 10/25/17  ? Left tonsil and bilateral neck 70 Gy in 35 fractions to gross disease, 63 gy in 35  fractions to high risk nodal echelons, and 56 Gy in 35 fraction to intermediate risk nodal echelons.   ? ? ?Past Surgical History:  ?Procedure Laterality Date  ? BRONCHIAL BIOPSY  01/02/2022  ? Procedure: BRONCHIAL BIOPSIES;  Surgeon: Garner Nash, DO;  Location: [place] [facility];  Service: Pulmonary;;  ? BRONCHIAL BRUSHINGS  01/02/2022  ? Procedure: BRONCHIAL BRUSHINGS;  Surgeon: Garner Nash, DO;  Location: [place] [facility];  Service: Pulmonary;;  ? COLONOSCOPY    ? FIDUCIAL MARKER PLACEMENT  01/02/2022  ? Procedure: FIDUCIAL MARKER PLACEMENT;  Surgeon: Garner Nash, DO;  Location: [place] [facility];  Service: Pulmonary;;  ? IR FLUORO GUIDE PORT INSERTION RIGHT  09/13/2017  ? IR GASTROSTOMY TUBE MOD SED  09/13/2017  ? IR GASTROSTOMY TUBE REMOVAL  03/28/2018  ? IR REMOVAL TUN ACCESS W/ PORT W/O FL MOD SED  03/28/2018  ? IR US GUIDE VASC ACCESS RIGHT  09/13/2017  ? MULTIPLE EXTRACTIONS WITH ALVEOLOPLASTY N/A 08/09/2017  ? Procedure: Extraction of tooth #'s 1-6, 11-17,21,22,26-30 and 32 with alveoloplasty and bilateral mandibular tori reductions;  Surgeon: Lenn Cal, DDS;  Location: [facility];  Service: Oral Surgery;  Laterality: N/A;  ? tonsil biopsy    ? left tonsil  ? ? ?Family History  ?Problem Relation Age of Onset  ? Other Mother   ?     pre-diabetes  ? Colon cancer Neg Hx   ? Esophageal cancer Neg Hx   ? Liver cancer Neg Hx   ? Pancreatic cancer Neg Hx   ? Rectal cancer Neg Hx   ? Stomach cancer Neg Hx   ? ? ?Social History ?Social History  ? ?Tobacco Use  ? Smoking status: Every Day  ?  Packs/day: 0.25  ?  Years: 30.00  ?  Pack years: 7.50  ?  Types: Cigarettes  ? Smokeless tobacco: Never  ? Tobacco comments:  ?  Tobacco info given  ?Vaping Use  ? Vaping Use: Never used  ?Substance Use Topics  ? Alcohol use: Yes  ?  Alcohol/week: 7.0 standard drinks  ?  Types: 7 Cans of beer per week  ?  Comment: 1 pint daily or more  ? Drug use: Yes  ?  Types: Cocaine, Marijuana  ?  Comment: he has a past history of  cocaine abuse, he denies current use  ? ? ?No current outpatient medications on file.  ? ?No current facility-administered medications for this visit.  ? ?Facility-Administered Medications Ordered in Other Visits  ?Medication Dose Route Frequency Provider Last Rate Last Admin  ? lactated ringers infusion   Intravenous Continuous [name], MD      ? ? ?No Known Allergies ? ?Review of Systems  ?Constitutional:  Negative for activity change, appetite change and unexpected weight change.  ?HENT:  Negative for trouble swallowing and voice change.   ?Respiratory:  Negative for shortness of breath and wheezing.   ?Cardiovascular:  Negative for chest pain and leg swelling.  ?Gastrointestinal:  Negative for abdominal distention and abdominal pain.  ?Genitourinary:  Negative for difficulty urinating and dysuria.  ?Musculoskeletal:  Negative for arthralgias and myalgias.  ?Neurological:  Negative for seizures, syncope and weakness.  ?Hematological:  Negative for adenopathy. Does not bruise/bleed easily.  ?All other systems reviewed and are negative. ? ?BP 118/83 (BP Location: Right Arm, Patient Position: Sitting)   Pulse 90   Resp 20   Ht 5\' 7"  (1.702 m)   Wt 120 lb (54.4 kg)   SpO2 99% Comment: RA  BMI 18.79 kg/m?  ?Physical Exam ?Vitals reviewed.  ?Constitutional:   ?   General: He is not in acute distress. ?   Appearance: Normal appearance.  ?HENT:  ?   Head: Normocephalic and atraumatic.  ?Eyes:  ?   General: No scleral icterus. ?   Extraocular Movements: Extraocular movements intact.  ?Neck:  ?   Vascular: No carotid bruit.  ?Cardiovascular:  ?   Rate and Rhythm: Normal rate and regular rhythm.  ?   Pulses: Normal pulses.  ?   Heart sounds: Normal heart sounds. No murmur heard. ?  No friction rub. No gallop.  ?Pulmonary:  ?   Effort: Pulmonary effort is normal. No respiratory distress.  ?   Breath sounds: Normal breath sounds. No wheezing or rales.  ?Abdominal:  ?   General: There is no distension.  ?    Palpations: Abdomen is soft.  ?Skin: ?   General: Skin is warm and dry.  ?Neurological:  ?   General: No focal deficit present.  ?   Mental Status: He is alert and oriented to person, place, and  time.  ?   Cranial Nerves: No cranial nerve deficit.  ?   Motor: No weakness.  ? ? ?Diagnostic Tests: ?CT CHEST WITHOUT CONTRAST ?  ?TECHNIQUE: ?Multidetector CT imaging of the chest was performed using thin slice ?collimation for electromagnetic bronchoscopy planning purposes, ?without intravenous contrast. ?  ?RADIATION DOSE REDUCTION: This exam was performed according to the ?departmental dose-optimization program which includes automated ?exposure control, adjustment of the mA and/or kV according to ?patient size and/or use of iterative reconstruction technique. ?  ?COMPARISON:  Chest CT 05/02/2021. ?  ?FINDINGS: ?Cardiovascular: Normal heart size. No pericardial effusion. Thoracic ?aortic and coronary arterial vascular calcifications. ?  ?Mediastinum/Nodes: No enlarged axillary, mediastinal or hilar ?lymphadenopathy. Normal appearance of the esophagus. ?  ?Lungs/Pleura: Central airways are patent. Centrilobular and ?paraseptal emphysematous changes. No large area of pulmonary ?consolidation. No pleural effusion or pneumothorax. Biapical ?scarring. ?  ?Subsolid nodule within the right upper lobe (image 51; series 5) ?measures 1.5 x 1.0 x 0.9 cm, previously 1.4 x 0.9 x 0.9 Cm. Overall ?this appears similar to minimally increased in size when compared to ?prior exam. ?  ?Ground-glass nodule within the anterior left upper lobe measures 5 x ?5 mm, previously 5 x 6 mm, not significantly changed from prior ?(image 51; series 5). ?  ?Probable scarring in the medial right upper lobe and lingula not ?significantly changed from prior exams. ?  ?Upper Abdomen: No acute abnormality. ?  ?Musculoskeletal: No aggressive or acute appearing osseous lesions. ?  ?IMPRESSION: ?1. Similar to minimal interval increase in size of subsolid  nodule ?within the right upper lobe when compared to prior exams. As ?discussed previously, given the mild interval increase in size over ?the prior years, possibility of a slow-growing low-grade ?adenocarcinoma is not entir

## 2022-01-26 ENCOUNTER — Telehealth: Payer: Self-pay | Admitting: *Deleted

## 2022-01-26 ENCOUNTER — Ambulatory Visit (HOSPITAL_COMMUNITY): Admission: RE | Admit: 2022-01-26 | Payer: Medicaid Other | Source: Ambulatory Visit

## 2022-01-26 NOTE — Telephone Encounter (Signed)
CALLED PATIENT TO ASK ABOUT RESCHEDULING MISSED PET SCAN, SPOKE WITH PATIENT AND HE STATED THAT HE IS HAVING SURGERY ON 02-05-22 AND WANTS TO WAIT UNTIL AFTER THE SURGERY ?

## 2022-01-27 ENCOUNTER — Ambulatory Visit: Payer: Medicaid Other | Admitting: Radiation Oncology

## 2022-02-02 NOTE — Progress Notes (Signed)
Surgical Instructions ? ? ? Your procedure is scheduled on Thursday April 20. ? Report to [facility] Main Entrance "A" at 6 A.M., then check in with the Admitting office. ? Call this number if you have problems the morning of surgery: ? [phone] ? ? If you have any questions prior to your surgery date call [phone]: Open Monday-Friday 8am-4pm ? ? ? Remember: ? Do not eat or drink anything after midnight the night before your surgery ? ? Take these medicines the morning of surgery with A SIP OF WATER:  ?acetaminophen (TYLENOL) if needed ?tetrahydrozoline 0.05 % ophthalmic solution if needed ? ? ?As of today, STOP taking any Aspirin (unless otherwise instructed by your surgeon) Aleve, Naproxen, Ibuprofen, Motrin, Advil, Goody's, BC's, all herbal medications, fish oil, and all vitamins. ? ?         ?Do not wear jewelry or makeup ?Do not wear lotions, powders, perfumes/colognes, or deodorant. ?Do not shave 48 hours prior to surgery.  Men may shave face and neck. ?Do not bring valuables to the hospital. ?Do not wear nail polish, gel polish, artificial nails, or any other type of covering on natural nails (fingers and toes) ?If you have artificial nails or gel coating that need to be removed by a nail salon, please have this removed prior to surgery. Artificial nails or gel coating may interfere with anesthesia's ability to adequately monitor your vital signs. ? ?[facility] is not responsible for any belongings or valuables. .  ? ?Do NOT Smoke (Tobacco/Vaping)  24 hours prior to your procedure ? ?If you use a CPAP at night, you may bring your mask for your overnight stay. ?  ?Contacts, glasses, hearing aids, dentures or partials may not be worn into surgery, please bring cases for these belongings ?  ?For patients admitted to the hospital, discharge time will be determined by your treatment team. ?  ?Patients discharged the day of surgery will not be allowed to drive home, and someone needs to stay with them for  24 hours. ? ? ?SURGICAL WAITING ROOM VISITATION ?Patients having surgery or a procedure in a hospital may have two support people. ?Children under the age of 30 must have an adult with them who is not the patient. ?They may stay in the waiting area during the procedure and may switch out with other visitors. If the patient needs to stay at the hospital during part of their recovery, the visitor guidelines for inpatient rooms apply. ? ?Please refer to the [facility] website for the visitor guidelines for Inpatients (after your surgery is over and you are in a regular room).  ? ? ? ? ? ?Special instructions:   ? ?Oral Hygiene is also important to reduce your risk of infection.  Remember - BRUSH YOUR TEETH THE MORNING OF SURGERY WITH YOUR REGULAR TOOTHPASTE ? ? ?[place]- Preparing For Surgery ? ?Before surgery, you can play an important role. Because skin is not sterile, your skin needs to be as free of germs as possible. You can reduce the number of germs on your skin by washing with CHG (chlorahexidine gluconate) Soap before surgery.  CHG is an antiseptic cleaner which kills germs and bonds with the skin to continue killing germs even after washing.   ? ? ?Please do not use if you have an allergy to CHG or antibacterial soaps. If your skin becomes reddened/irritated stop using the CHG.  ?Do not shave (including legs and underarms) for at least 48 hours prior to first  CHG shower. It is OK to shave your face. ? ?Please follow these instructions carefully. ?  ? ? Shower the NIGHT BEFORE SURGERY and the MORNING OF SURGERY with CHG Soap.  ? If you chose to wash your hair, wash your hair first as usual with your normal shampoo. After you shampoo, rinse your hair and body thoroughly to remove the shampoo.  Then [employer] and genitals (private parts) with your normal soap and rinse thoroughly to remove soap. ? ?After that Use CHG Soap as you would any other liquid soap. You can apply CHG directly to the skin and wash  gently with a scrungie or a clean washcloth.  ? ?Apply the CHG Soap to your body ONLY FROM THE NECK DOWN.  Do not use on open wounds or open sores. Avoid contact with your eyes, ears, mouth and genitals (private parts). Wash Face and genitals (private parts)  with your normal soap.  ? ?Wash thoroughly, paying special attention to the area where your surgery will be performed. ? ?Thoroughly rinse your body with warm water from the neck down. ? ?DO NOT shower/wash with your normal soap after using and rinsing off the CHG Soap. ? ?Pat yourself dry with a CLEAN TOWEL. ? ?Wear CLEAN PAJAMAS to bed the night before surgery ? ?Place CLEAN SHEETS on your bed the night before your surgery ? ?DO NOT SLEEP WITH PETS. ? ? ?Day of Surgery: ? ?Take a shower with CHG soap. ?Wear Clean/Comfortable clothing the morning of surgery ?Do not apply any deodorants/lotions.   ?Remember to brush your teeth WITH YOUR REGULAR TOOTHPASTE. ? ? ? ?If you received a COVID test during your pre-op visit  it is requested that you wear a mask when out in public, stay away from anyone that may not be feeling well and notify your surgeon if you develop symptoms. If you have been in contact with anyone that has tested positive in the last 10 days please notify you surgeon. ? ?  ?Please read over the following fact sheets that you were given.   ?

## 2022-02-03 ENCOUNTER — Other Ambulatory Visit: Payer: Self-pay | Admitting: Thoracic Surgery (Cardiothoracic Vascular Surgery)

## 2022-02-03 ENCOUNTER — Inpatient Hospital Stay (HOSPITAL_COMMUNITY)
Admission: RE | Admit: 2022-02-03 | Discharge: 2022-02-03 | Disposition: A | Discharge status: Home / Self Care | Payer: Medicaid Other | Source: Ambulatory Visit

## 2022-02-03 LAB — SARS CORONAVIRUS 2 (TAT 6-24 HRS): SARS Coronavirus 2: NEGATIVE

## 2022-02-04 ENCOUNTER — Other Ambulatory Visit: Payer: Self-pay

## 2022-02-04 ENCOUNTER — Encounter (HOSPITAL_COMMUNITY): Payer: Self-pay | Admitting: Thoracic Surgery (Cardiothoracic Vascular Surgery)

## 2022-02-04 NOTE — Progress Notes (Signed)
Pre-Op call done with the son- [name], as the pt was unavailable. ? ?PCP - Dr. Jeanie Cooks, E. ? ?Cardiologist - Denies ? ?EP- Denies ? ?Endocrine- Denies ? ?Pulm- Denies ? ?Chest x-ray - 02/05/22- Day of surgery ? ?EKG - 02/05/22- Day of surgery ? ?Stress Test - Denies ? ?ECHO - Denies ? ?Cardiac Cath - Denies ? ?AICD-na ?PM-na ?LOOP-na ? ?Nerve Stimulator- Denies ? ?Dialysis- Denies ? ?Sleep Study - Denies ?CPAP - Denies ? ?LABS- 02/03/22: COVID- Neg ?02/05/22: CBC, CMP, PT, PTT ABG, UA, T/S x2, PCR ? ?ASA- Cont. ?Plavix- Cont. ? ?ERAS- No ? ?HA1C- Denies ? ?Anesthesia- No ? ?The son [name] denies the pt having chest pain, sob, or fever during the pre-op phone call. All instructions explained to [facility], with a verbal understanding of the material including: as of today, stop taking all Aspirin (unless instructed by your doctor) and Other Aspirin containing products, Vitamins, Fish oils, and Herbal medications. Also stop all NSAIDS i.e. Advil, Ibuprofen, Motrin, Aleve, Anaprox, Naproxen, BC, Goody Powders, and all Supplements.  [name] also instructed to have the pt wear a mask and social distance if he goes out. The opportunity to ask questions was provided.  ?

## 2022-02-05 ENCOUNTER — Inpatient Hospital Stay (HOSPITAL_COMMUNITY)
Admission: RE | Admit: 2022-02-05 | Discharge: 2022-02-09 | DRG: 164 | Disposition: A | Discharge status: Home / Self Care | Payer: Medicaid Other | Attending: Thoracic Surgery (Cardiothoracic Vascular Surgery) | Admitting: Thoracic Surgery (Cardiothoracic Vascular Surgery)

## 2022-02-05 ENCOUNTER — Other Ambulatory Visit: Payer: Self-pay

## 2022-02-05 ENCOUNTER — Inpatient Hospital Stay (HOSPITAL_COMMUNITY): Payer: Medicaid Other | Admitting: Certified Registered Nurse Anesthetist

## 2022-02-05 ENCOUNTER — Inpatient Hospital Stay (HOSPITAL_COMMUNITY): Payer: Medicaid Other

## 2022-02-05 ENCOUNTER — Encounter (HOSPITAL_COMMUNITY): Payer: Self-pay | Admitting: Thoracic Surgery (Cardiothoracic Vascular Surgery)

## 2022-02-05 ENCOUNTER — Encounter (HOSPITAL_COMMUNITY)
Admission: RE | Disposition: A | Discharge status: Home / Self Care | Payer: Self-pay | Source: Home / Self Care | Attending: Thoracic Surgery (Cardiothoracic Vascular Surgery)

## 2022-02-05 DIAGNOSIS — J9382 Other air leak: Secondary | ICD-10-CM | POA: Diagnosis not present

## 2022-02-05 DIAGNOSIS — Z9221 Personal history of antineoplastic chemotherapy: Secondary | ICD-10-CM

## 2022-02-05 DIAGNOSIS — D62 Acute posthemorrhagic anemia: Secondary | ICD-10-CM | POA: Diagnosis not present

## 2022-02-05 DIAGNOSIS — Z902 Acquired absence of lung [part of]: Principal | ICD-10-CM

## 2022-02-05 DIAGNOSIS — M1990 Unspecified osteoarthritis, unspecified site: Secondary | ICD-10-CM

## 2022-02-05 DIAGNOSIS — C3411 Malignant neoplasm of upper lobe, right bronchus or lung: Principal | ICD-10-CM | POA: Diagnosis present

## 2022-02-05 DIAGNOSIS — F1010 Alcohol abuse, uncomplicated: Secondary | ICD-10-CM | POA: Diagnosis present

## 2022-02-05 DIAGNOSIS — I459 Conduction disorder, unspecified: Secondary | ICD-10-CM | POA: Diagnosis not present

## 2022-02-05 DIAGNOSIS — Z923 Personal history of irradiation: Secondary | ICD-10-CM | POA: Diagnosis not present

## 2022-02-05 DIAGNOSIS — F17210 Nicotine dependence, cigarettes, uncomplicated: Secondary | ICD-10-CM | POA: Diagnosis present

## 2022-02-05 DIAGNOSIS — Z85818 Personal history of malignant neoplasm of other sites of lip, oral cavity, and pharynx: Secondary | ICD-10-CM

## 2022-02-05 DIAGNOSIS — D630 Anemia in neoplastic disease: Secondary | ICD-10-CM | POA: Diagnosis not present

## 2022-02-05 DIAGNOSIS — R911 Solitary pulmonary nodule: Secondary | ICD-10-CM

## 2022-02-05 HISTORY — PX: INTERCOSTAL NERVE BLOCK: SHX5021

## 2022-02-05 HISTORY — PX: NODE DISSECTION: SHX5269

## 2022-02-05 LAB — COMPREHENSIVE METABOLIC PANEL
ALT: 14 U/L (ref 0–44)
AST: 26 U/L (ref 15–41)
Albumin: 4 g/dL (ref 3.5–5.0)
Alkaline Phosphatase: 45 U/L (ref 38–126)
Anion gap: 10 (ref 5–15)
BUN: 6 mg/dL (ref 6–20)
CO2: 25 mmol/L (ref 22–32)
Calcium: 9 mg/dL (ref 8.9–10.3)
Chloride: 105 mmol/L (ref 98–111)
Creatinine, Ser: 0.71 mg/dL (ref 0.61–1.24)
GFR, Estimated: >60 mL/min (ref >60)
Glucose, Bld: 105 mg/dL — ABNORMAL HIGH (ref 70–99)
Potassium: 3.6 mmol/L (ref 3.5–5.1)
Sodium: 140 mmol/L (ref 135–145)
Total Bilirubin: 0.5 mg/dL (ref 0.3–1.2)
Total Protein: 6.9 g/dL (ref 6.5–8.1)

## 2022-02-05 LAB — CBC
HCT: 36 % — ABNORMAL LOW (ref 39.0–52.0)
Hemoglobin: 11.9 g/dL — ABNORMAL LOW (ref 13.0–17.0)
MCH: 29.8 pg (ref 26.0–34.0)
MCHC: 33.1 g/dL (ref 30.0–36.0)
MCV: 90.2 fL (ref 80.0–100.0)
Platelets: 140 10*3/uL — ABNORMAL LOW (ref 150–400)
RBC: 3.99 MIL/uL — ABNORMAL LOW (ref 4.22–5.81)
RDW: 15.6 % — ABNORMAL HIGH (ref 11.5–15.5)
WBC: 2.7 10*3/uL — ABNORMAL LOW (ref 4.0–10.5)
nRBC: 0 % (ref 0.0–0.2)

## 2022-02-05 LAB — BLOOD GAS, ARTERIAL
Acid-Base Excess: 2.7 mmol/L — ABNORMAL HIGH (ref 0.0–2.0)
Bicarbonate: 27.9 mmol/L (ref 20.0–28.0)
Drawn by: 58743
O2 Saturation: 98.6 %
Patient temperature: 37
pCO2 arterial: 44 mmHg (ref 32–48)
pH, Arterial: 7.41 (ref 7.35–7.45)
pO2, Arterial: 92 mmHg (ref 83–108)

## 2022-02-05 LAB — URINALYSIS, ROUTINE W REFLEX MICROSCOPIC
Bilirubin Urine: NEGATIVE
Glucose, UA: NEGATIVE mg/dL
Hgb urine dipstick: NEGATIVE
Ketones, ur: NEGATIVE mg/dL
Leukocytes,Ua: NEGATIVE
Nitrite: NEGATIVE
Protein, ur: NEGATIVE mg/dL
Specific Gravity, Urine: 1.009 (ref 1.005–1.030)
pH: 6 (ref 5.0–8.0)

## 2022-02-05 LAB — APTT: aPTT: 30 seconds (ref 24–36)

## 2022-02-05 LAB — ABO/RH: ABO/RH(D): A POS

## 2022-02-05 LAB — PROTIME-INR
INR: 1 (ref 0.8–1.2)
Prothrombin Time: 12.9 seconds (ref 11.4–15.2)

## 2022-02-05 LAB — TYPE AND SCREEN
ABO/RH(D): A POS
Antibody Screen: NEGATIVE

## 2022-02-05 LAB — SURGICAL PCR SCREEN
MRSA, PCR: NEGATIVE
Staphylococcus aureus: NEGATIVE

## 2022-02-05 SURGERY — LOBECTOMY, LUNG, ROBOT-ASSISTED, USING VATS
Anesthesia: General | Site: Chest | Laterality: Right

## 2022-02-05 MED ORDER — ACETAMINOPHEN 10 MG/ML IV SOLN
INTRAVENOUS | Status: DC | PRN
  Administered 2022-02-05: 1000 mg via INTRAVENOUS

## 2022-02-05 MED ORDER — SENNOSIDES-DOCUSATE SODIUM 8.6-50 MG PO TABS
1.0000 | ORAL_TABLET | Freq: Every day | ORAL | Status: DC
  Administered 2022-02-05 – 2022-02-08 (×4): 1 via ORAL
  Filled 2022-02-05 (×4): qty 1

## 2022-02-05 MED ORDER — KETOROLAC TROMETHAMINE 30 MG/ML IJ SOLN: 30.0000 mg | Freq: Once | INTRAMUSCULAR | Status: DC

## 2022-02-05 MED ORDER — SUGAMMADEX SODIUM 200 MG/2ML IV SOLN
INTRAVENOUS | Status: DC | PRN
  Administered 2022-02-05: 200 mg via INTRAVENOUS

## 2022-02-05 MED ORDER — CEFAZOLIN SODIUM-DEXTROSE 2-4 GM/100ML-% IV SOLN
2.0000 g | Freq: Three times a day (TID) | INTRAVENOUS | Status: AC
  Administered 2022-02-05 – 2022-02-06 (×2): 2 g via INTRAVENOUS
  Filled 2022-02-05 (×2): qty 100

## 2022-02-05 MED ORDER — BISACODYL 5 MG PO TBEC
10.0000 mg | DELAYED_RELEASE_TABLET | Freq: Every day | ORAL | Status: DC
  Administered 2022-02-05 – 2022-02-09 (×5): 10 mg via ORAL
  Filled 2022-02-05 (×5): qty 2

## 2022-02-05 MED ORDER — FENTANYL CITRATE (PF) 250 MCG/5ML IJ SOLN
INTRAMUSCULAR | Status: DC | PRN
  Administered 2022-02-05 (×2): 50 ug via INTRAVENOUS
  Administered 2022-02-05: 25 ug via INTRAVENOUS
  Administered 2022-02-05: 50 ug via INTRAVENOUS

## 2022-02-05 MED ORDER — DEXAMETHASONE SODIUM PHOSPHATE 10 MG/ML IJ SOLN
INTRAMUSCULAR | Status: DC | PRN
  Administered 2022-02-05: 10 mg via INTRAVENOUS

## 2022-02-05 MED ORDER — LORAZEPAM 2 MG/ML IJ SOLN: 1.0000 mg | INTRAMUSCULAR | Status: AC | PRN

## 2022-02-05 MED ORDER — KETOROLAC TROMETHAMINE 30 MG/ML IJ SOLN
INTRAMUSCULAR | Status: DC | PRN
  Administered 2022-02-05: 30 mg via INTRAVENOUS

## 2022-02-05 MED ORDER — LORAZEPAM 1 MG PO TABS: 1.0000 mg | ORAL_TABLET | ORAL | Status: AC | PRN

## 2022-02-05 MED ORDER — ACETAMINOPHEN 500 MG PO TABS
1000.0000 mg | ORAL_TABLET | Freq: Four times a day (QID) | ORAL | Status: DC
  Administered 2022-02-05 – 2022-02-09 (×15): 1000 mg via ORAL
  Filled 2022-02-05 (×15): qty 2

## 2022-02-05 MED ORDER — ONDANSETRON HCL 4 MG/2ML IJ SOLN
INTRAMUSCULAR | Status: DC | PRN
  Administered 2022-02-05: 4 mg via INTRAVENOUS

## 2022-02-05 MED ORDER — PHENYLEPHRINE 80 MCG/ML (10ML) SYRINGE FOR IV PUSH (FOR BLOOD PRESSURE SUPPORT)
PREFILLED_SYRINGE | INTRAVENOUS | Status: DC | PRN
  Administered 2022-02-05 (×5): 80 ug via INTRAVENOUS

## 2022-02-05 MED ORDER — TRAMADOL HCL 50 MG PO TABS: 50.0000 mg | ORAL_TABLET | Freq: Four times a day (QID) | ORAL | Status: DC | PRN

## 2022-02-05 MED ORDER — ACETAMINOPHEN 10 MG/ML IV SOLN
INTRAVENOUS | Status: AC
  Filled 2022-02-05: qty 100

## 2022-02-05 MED ORDER — ADULT MULTIVITAMIN W/MINERALS CH
1.0000 | ORAL_TABLET | Freq: Every day | ORAL | Status: DC
  Administered 2022-02-05 – 2022-02-09 (×5): 1 via ORAL
  Filled 2022-02-05 (×5): qty 1

## 2022-02-05 MED ORDER — ONDANSETRON HCL 4 MG/2ML IJ SOLN
INTRAMUSCULAR | Status: AC
  Filled 2022-02-05: qty 2

## 2022-02-05 MED ORDER — ACETAMINOPHEN 10 MG/ML IV SOLN: 1000.0000 mg | Freq: Once | INTRAVENOUS | Status: DC | PRN

## 2022-02-05 MED ORDER — ALBUTEROL SULFATE (2.5 MG/3ML) 0.083% IN NEBU
2.5000 mg | INHALATION_SOLUTION | RESPIRATORY_TRACT | Status: DC
  Administered 2022-02-05: 2.5 mg via RESPIRATORY_TRACT
  Filled 2022-02-05: qty 3

## 2022-02-05 MED ORDER — EPHEDRINE SULFATE-NACL 50-0.9 MG/10ML-% IV SOSY
PREFILLED_SYRINGE | INTRAVENOUS | Status: DC | PRN
  Administered 2022-02-05: 5 mg via INTRAVENOUS

## 2022-02-05 MED ORDER — ROCURONIUM BROMIDE 10 MG/ML (PF) SYRINGE
PREFILLED_SYRINGE | INTRAVENOUS | Status: AC
  Filled 2022-02-05: qty 10

## 2022-02-05 MED ORDER — KETOROLAC TROMETHAMINE 15 MG/ML IJ SOLN
15.0000 mg | Freq: Four times a day (QID) | INTRAMUSCULAR | Status: AC
  Administered 2022-02-05 – 2022-02-07 (×8): 15 mg via INTRAVENOUS
  Filled 2022-02-05 (×8): qty 1

## 2022-02-05 MED ORDER — LIDOCAINE 2% (20 MG/ML) 5 ML SYRINGE
INTRAMUSCULAR | Status: AC
  Filled 2022-02-05: qty 5

## 2022-02-05 MED ORDER — 0.9 % SODIUM CHLORIDE (POUR BTL) OPTIME
TOPICAL | Status: DC | PRN
  Administered 2022-02-05: 2000 mL

## 2022-02-05 MED ORDER — LACTATED RINGERS IV SOLN: INTRAVENOUS | Status: DC

## 2022-02-05 MED ORDER — ALBUTEROL SULFATE (2.5 MG/3ML) 0.083% IN NEBU: 2.5000 mg | INHALATION_SOLUTION | RESPIRATORY_TRACT | Status: DC | PRN

## 2022-02-05 MED ORDER — CHLORHEXIDINE GLUCONATE 0.12 % MT SOLN
OROMUCOSAL | Status: AC
  Administered 2022-02-05: 15 mL via OROMUCOSAL
  Filled 2022-02-05: qty 15

## 2022-02-05 MED ORDER — PROPOFOL 10 MG/ML IV BOLUS
INTRAVENOUS | Status: DC | PRN
  Administered 2022-02-05: 20 mg via INTRAVENOUS
  Administered 2022-02-05: 70 mg via INTRAVENOUS

## 2022-02-05 MED ORDER — PROPOFOL 10 MG/ML IV BOLUS
INTRAVENOUS | Status: AC
  Filled 2022-02-05: qty 20

## 2022-02-05 MED ORDER — OXYCODONE HCL 5 MG PO TABS
5.0000 mg | ORAL_TABLET | ORAL | Status: DC | PRN
  Administered 2022-02-05: 5 mg via ORAL
  Administered 2022-02-06 – 2022-02-09 (×9): 10 mg via ORAL
  Filled 2022-02-05 (×9): qty 2
  Filled 2022-02-05: qty 1

## 2022-02-05 MED ORDER — MIDAZOLAM HCL 2 MG/2ML IJ SOLN
INTRAMUSCULAR | Status: DC | PRN
  Administered 2022-02-05: 2 mg via INTRAVENOUS

## 2022-02-05 MED ORDER — LIDOCAINE 2% (20 MG/ML) 5 ML SYRINGE
INTRAMUSCULAR | Status: DC | PRN
  Administered 2022-02-05: 40 mg via INTRAVENOUS

## 2022-02-05 MED ORDER — BUPIVACAINE HCL (PF) 0.5 % IJ SOLN
INTRAMUSCULAR | Status: AC
  Filled 2022-02-05: qty 30

## 2022-02-05 MED ORDER — FENTANYL CITRATE (PF) 100 MCG/2ML IJ SOLN
25.0000 ug | INTRAMUSCULAR | Status: DC | PRN
  Administered 2022-02-05 (×4): 50 ug via INTRAVENOUS

## 2022-02-05 MED ORDER — CHLORHEXIDINE GLUCONATE 0.12 % MT SOLN: 15.0000 mL | Freq: Once | OROMUCOSAL | Status: AC

## 2022-02-05 MED ORDER — PHENYLEPHRINE HCL-NACL 20-0.9 MG/250ML-% IV SOLN
INTRAVENOUS | Status: DC | PRN
  Administered 2022-02-05: 30 ug/min via INTRAVENOUS

## 2022-02-05 MED ORDER — LACTATED RINGERS IV SOLN: INTRAVENOUS | Status: DC | PRN

## 2022-02-05 MED ORDER — BUPIVACAINE LIPOSOME 1.3 % IJ SUSP
INTRAMUSCULAR | Status: AC
  Filled 2022-02-05: qty 20

## 2022-02-05 MED ORDER — ORAL CARE MOUTH RINSE: 15.0000 mL | Freq: Once | OROMUCOSAL | Status: AC

## 2022-02-05 MED ORDER — KETOROLAC TROMETHAMINE 30 MG/ML IJ SOLN
INTRAMUSCULAR | Status: AC
  Filled 2022-02-05: qty 1

## 2022-02-05 MED ORDER — PHENYLEPHRINE 80 MCG/ML (10ML) SYRINGE FOR IV PUSH (FOR BLOOD PRESSURE SUPPORT): PREFILLED_SYRINGE | INTRAVENOUS | Status: DC | PRN

## 2022-02-05 MED ORDER — MIDAZOLAM HCL 2 MG/2ML IJ SOLN
INTRAMUSCULAR | Status: AC
  Filled 2022-02-05: qty 2

## 2022-02-05 MED ORDER — EPHEDRINE 5 MG/ML INJ
INTRAVENOUS | Status: AC
  Filled 2022-02-05: qty 5

## 2022-02-05 MED ORDER — ACETAMINOPHEN 160 MG/5ML PO SOLN: 1000.0000 mg | Freq: Four times a day (QID) | ORAL | Status: DC

## 2022-02-05 MED ORDER — ENOXAPARIN SODIUM 40 MG/0.4ML IJ SOSY
40.0000 mg | PREFILLED_SYRINGE | INTRAMUSCULAR | Status: DC
  Administered 2022-02-06 – 2022-02-07 (×2): 40 mg via SUBCUTANEOUS
  Filled 2022-02-05 (×3): qty 0.4

## 2022-02-05 MED ORDER — DEXTROSE-NACL 5-0.9 % IV SOLN: INTRAVENOUS | Status: DC

## 2022-02-05 MED ORDER — SODIUM CHLORIDE 0.9 % IR SOLN
Status: DC | PRN
  Administered 2022-02-05: 1000 mL

## 2022-02-05 MED ORDER — ACETAMINOPHEN 500 MG PO TABS: 1000.0000 mg | ORAL_TABLET | Freq: Once | ORAL | Status: DC | PRN

## 2022-02-05 MED ORDER — CEFAZOLIN SODIUM-DEXTROSE 2-4 GM/100ML-% IV SOLN
2.0000 g | INTRAVENOUS | Status: AC
  Administered 2022-02-05: 2 g via INTRAVENOUS
  Filled 2022-02-05: qty 100

## 2022-02-05 MED ORDER — FENTANYL CITRATE (PF) 100 MCG/2ML IJ SOLN
INTRAMUSCULAR | Status: AC
  Filled 2022-02-05: qty 2

## 2022-02-05 MED ORDER — FOLIC ACID 1 MG PO TABS
1.0000 mg | ORAL_TABLET | Freq: Every day | ORAL | Status: DC
  Administered 2022-02-05 – 2022-02-09 (×5): 1 mg via ORAL
  Filled 2022-02-05 (×5): qty 1

## 2022-02-05 MED ORDER — OXYCODONE HCL 5 MG/5ML PO SOLN: 5.0000 mg | Freq: Once | ORAL | Status: DC | PRN

## 2022-02-05 MED ORDER — SODIUM CHLORIDE FLUSH 0.9 % IV SOLN
INTRAVENOUS | Status: DC | PRN
  Administered 2022-02-05: 87 mL

## 2022-02-05 MED ORDER — OXYCODONE HCL 5 MG PO TABS: 5.0000 mg | ORAL_TABLET | Freq: Once | ORAL | Status: DC | PRN

## 2022-02-05 MED ORDER — FENTANYL CITRATE (PF) 250 MCG/5ML IJ SOLN
INTRAMUSCULAR | Status: AC
  Filled 2022-02-05: qty 5

## 2022-02-05 MED ORDER — ONDANSETRON HCL 4 MG/2ML IJ SOLN: 4.0000 mg | Freq: Four times a day (QID) | INTRAMUSCULAR | Status: DC | PRN

## 2022-02-05 MED ORDER — ROCURONIUM BROMIDE 10 MG/ML (PF) SYRINGE
PREFILLED_SYRINGE | INTRAVENOUS | Status: DC | PRN
  Administered 2022-02-05: 10 mg via INTRAVENOUS
  Administered 2022-02-05: 20 mg via INTRAVENOUS
  Administered 2022-02-05: 40 mg via INTRAVENOUS
  Administered 2022-02-05: 60 mg via INTRAVENOUS

## 2022-02-05 MED ORDER — THIAMINE HCL 100 MG/ML IJ SOLN: 100.0000 mg | Freq: Every day | INTRAMUSCULAR | Status: DC

## 2022-02-05 MED ORDER — ACETAMINOPHEN 160 MG/5ML PO SOLN: 1000.0000 mg | Freq: Once | ORAL | Status: DC | PRN

## 2022-02-05 MED ORDER — DEXAMETHASONE SODIUM PHOSPHATE 10 MG/ML IJ SOLN
INTRAMUSCULAR | Status: AC
  Filled 2022-02-05: qty 1

## 2022-02-05 MED ORDER — HEMOSTATIC AGENTS (NO CHARGE) OPTIME
TOPICAL | Status: DC | PRN
  Administered 2022-02-05 (×2): 1 via TOPICAL

## 2022-02-05 MED ORDER — THIAMINE HCL 100 MG PO TABS
100.0000 mg | ORAL_TABLET | Freq: Every day | ORAL | Status: DC
  Administered 2022-02-05 – 2022-02-09 (×5): 100 mg via ORAL
  Filled 2022-02-05 (×5): qty 1

## 2022-02-05 SURGICAL SUPPLY — 110 items
ADH SKN CLS APL DERMABOND .7 (GAUZE/BANDAGES/DRESSINGS) ×1
BAG TISS RTRVL C300 12X14 (MISCELLANEOUS) ×1
BLADE CLIPPER SURG (BLADE) IMPLANT
CANISTER SUCT 3000ML PPV (MISCELLANEOUS) ×6 IMPLANT
CANNULA REDUC XI 12-8 STAPL (CANNULA) ×4
CANNULA REDUCER 12-8 DVNC XI (CANNULA) ×4 IMPLANT
CNTNR URN SCR LID CUP LEK RST (MISCELLANEOUS) ×10 IMPLANT
CONN ST 1/4X3/8  BEN (MISCELLANEOUS) ×2
CONN ST 1/4X3/8 BEN (MISCELLANEOUS) IMPLANT
CONN Y 3/8X3/8X3/8  BEN (MISCELLANEOUS)
CONN Y 3/8X3/8X3/8 BEN (MISCELLANEOUS) IMPLANT
CONT SPEC 4OZ STRL OR WHT (MISCELLANEOUS) ×22
COVER BACK TABLE 60X90IN (DRAPES) ×1 IMPLANT
DEFOGGER SCOPE WARMER CLEARIFY (MISCELLANEOUS) ×3 IMPLANT
DERMABOND ADVANCED (GAUZE/BANDAGES/DRESSINGS) ×1
DERMABOND ADVANCED .7 DNX12 (GAUZE/BANDAGES/DRESSINGS) ×2 IMPLANT
DRAIN CHANNEL 28F RND 3/8 FF (WOUND CARE) ×1 IMPLANT
DRAIN CHANNEL 32F RND 10.7 FF (WOUND CARE) IMPLANT
DRAPE ARM DVNC X/XI (DISPOSABLE) ×8 IMPLANT
DRAPE COLUMN DVNC XI (DISPOSABLE) ×2 IMPLANT
DRAPE CV SPLIT W-CLR ANES SCRN (DRAPES) ×3 IMPLANT
DRAPE DA VINCI XI ARM (DISPOSABLE) ×8
DRAPE DA VINCI XI COLUMN (DISPOSABLE) ×2
DRAPE HALF SHEET 40X57 (DRAPES) ×3 IMPLANT
DRAPE INCISE IOBAN 66X45 STRL (DRAPES) ×1 IMPLANT
DRAPE ORTHO SPLIT 77X108 STRL (DRAPES) ×2
DRAPE SURG ORHT 6 SPLT 77X108 (DRAPES) ×2 IMPLANT
ELECT BLADE 6.5 EXT (BLADE) ×3 IMPLANT
ELECT REM PT RETURN 9FT ADLT (ELECTROSURGICAL) ×2
ELECTRODE REM PT RTRN 9FT ADLT (ELECTROSURGICAL) ×2 IMPLANT
GAUZE KITTNER 4X5 RF (MISCELLANEOUS) ×6 IMPLANT
GAUZE SPONGE 4X4 12PLY STRL (GAUZE/BANDAGES/DRESSINGS) ×3 IMPLANT
GLOVE BIO SURGEON STRL SZ 6.5 (GLOVE) ×3 IMPLANT
GLOVE BIO SURGEON STRL SZ7.5 (GLOVE) ×1 IMPLANT
GLOVE SS BIOGEL STRL SZ 7.5 (GLOVE) IMPLANT
GLOVE SUPERSENSE BIOGEL SZ 7.5 (GLOVE) ×4
GLOVE SURG MICRO LTX SZ7.5 (GLOVE) ×9 IMPLANT
GLOVE SURG POLYISO LF SZ8 (GLOVE) ×3 IMPLANT
GLOVE SURG SS PI 8.0 STRL IVOR (GLOVE) ×3 IMPLANT
GOWN STRL REUS W/ TWL LRG LVL3 (GOWN DISPOSABLE) ×4 IMPLANT
GOWN STRL REUS W/ TWL XL LVL3 (GOWN DISPOSABLE) ×4 IMPLANT
GOWN STRL REUS W/TWL 2XL LVL3 (GOWN DISPOSABLE) ×3 IMPLANT
GOWN STRL REUS W/TWL LRG LVL3 (GOWN DISPOSABLE) ×4
GOWN STRL REUS W/TWL XL LVL3 (GOWN DISPOSABLE) ×4
HEMOSTAT SURGICEL 2X14 (HEMOSTASIS) ×9 IMPLANT
IRRIGATION STRYKERFLOW (MISCELLANEOUS) ×2 IMPLANT
IRRIGATOR STRYKERFLOW (MISCELLANEOUS) ×2
KIT BASIN OR (CUSTOM PROCEDURE TRAY) ×3 IMPLANT
KIT SUCTION CATH 14FR (SUCTIONS) IMPLANT
KIT TURNOVER KIT B (KITS) ×3 IMPLANT
NDL HYPO 25GX1X1/2 BEV (NEEDLE) ×2 IMPLANT
NDL SPNL 22GX3.5 QUINCKE BK (NEEDLE) ×2 IMPLANT
NEEDLE HYPO 25GX1X1/2 BEV (NEEDLE) ×2 IMPLANT
NEEDLE SPNL 22GX3.5 QUINCKE BK (NEEDLE) ×2 IMPLANT
NS IRRIG 1000ML POUR BTL (IV SOLUTION) ×6 IMPLANT
PACK CHEST (CUSTOM PROCEDURE TRAY) ×3 IMPLANT
PAD ARMBOARD 7.5X6 YLW CONV (MISCELLANEOUS) ×6 IMPLANT
PORT ACCESS TROCAR AIRSEAL 12 (TROCAR) ×2 IMPLANT
PORT ACCESS TROCAR AIRSEAL 5M (TROCAR) ×1
RELOAD STAPLE 45 2.5 WHT DVNC (STAPLE) IMPLANT
RELOAD STAPLE 45 3.5 BLU DVNC (STAPLE) IMPLANT
RELOAD STAPLE 45 4.3 GRN DVNC (STAPLE) IMPLANT
RELOAD STAPLER 2.5X45 WHT DVNC (STAPLE) ×2 IMPLANT
RELOAD STAPLER 3.5X45 BLU DVNC (STAPLE) ×9 IMPLANT
RELOAD STAPLER 4.3X45 GRN DVNC (STAPLE) ×1 IMPLANT
SCISSORS LAP 5X35 DISP (ENDOMECHANICALS) IMPLANT
SEAL CANN UNIV 5-8 DVNC XI (MISCELLANEOUS) ×4 IMPLANT
SEAL XI 5MM-8MM UNIVERSAL (MISCELLANEOUS) ×4
SEALER SYNCHRO 8 IS4000 DV (MISCELLANEOUS) ×2
SEALER SYNCHRO 8 IS4000 DVNC (MISCELLANEOUS) IMPLANT
SET TRI-LUMEN FLTR TB AIRSEAL (TUBING) ×3 IMPLANT
SHEARS HARMONIC HDI 20CM (ELECTROSURGICAL) IMPLANT
SOLUTION ELECTROLUBE (MISCELLANEOUS) ×3 IMPLANT
SPONGE INTESTINAL PEANUT (DISPOSABLE) IMPLANT
SPONGE T-LAP 18X18 ~~LOC~~+RFID (SPONGE) ×9 IMPLANT
STAPLER 45 SUREFORM CVD (STAPLE) ×2
STAPLER 45 SUREFORM CVD DVNC (STAPLE) IMPLANT
STAPLER CANNULA SEAL DVNC XI (STAPLE) ×4 IMPLANT
STAPLER CANNULA SEAL XI (STAPLE) ×4
STAPLER RELOAD 2.5X45 WHITE (STAPLE) ×4
STAPLER RELOAD 2.5X45 WHT DVNC (STAPLE) ×2
STAPLER RELOAD 3.5X45 BLU DVNC (STAPLE) ×9
STAPLER RELOAD 3.5X45 BLUE (STAPLE) ×18
STAPLER RELOAD 4.3X45 GREEN (STAPLE) ×2
STAPLER RELOAD 4.3X45 GRN DVNC (STAPLE) ×1
SUT PDS AB 3-0 SH 27 (SUTURE) IMPLANT
SUT PROLENE 4 0 RB 1 (SUTURE)
SUT PROLENE 4-0 RB1 .5 CRCL 36 (SUTURE) IMPLANT
SUT SILK  1 MH (SUTURE) ×2
SUT SILK 1 MH (SUTURE) ×2 IMPLANT
SUT SILK 1 TIES 10X30 (SUTURE) ×3 IMPLANT
SUT SILK 2 0 SH (SUTURE) ×3 IMPLANT
SUT SILK 2 0SH CR/8 30 (SUTURE) IMPLANT
SUT SILK 3 0SH CR/8 30 (SUTURE) IMPLANT
SUT VIC AB 1 CTX 36 (SUTURE) ×2
SUT VIC AB 1 CTX36XBRD ANBCTR (SUTURE) ×2 IMPLANT
SUT VIC AB 2-0 CTX 36 (SUTURE) ×3 IMPLANT
SUT VIC AB 3-0 MH 27 (SUTURE) IMPLANT
SUT VIC AB 3-0 X1 27 (SUTURE) ×6 IMPLANT
SUT VICRYL 0 TIES 12 18 (SUTURE) ×3 IMPLANT
SUT VICRYL 0 UR6 27IN ABS (SUTURE) ×6 IMPLANT
SUT VICRYL 2 TP 1 (SUTURE) IMPLANT
SYR 20CC LL (SYRINGE) ×6 IMPLANT
SYSTEM RETRIEVAL ANCHOR 12 (MISCELLANEOUS) ×1 IMPLANT
SYSTEM SAHARA CHEST DRAIN ATS (WOUND CARE) ×3 IMPLANT
TAPE CLOTH 4X10 WHT NS (GAUZE/BANDAGES/DRESSINGS) ×3 IMPLANT
TAPE CLOTH SURG 4X10 WHT LF (GAUZE/BANDAGES/DRESSINGS) ×1 IMPLANT
TOWEL GREEN STERILE (TOWEL DISPOSABLE) ×3 IMPLANT
TRAY FOLEY MTR SLVR 16FR STAT (SET/KITS/TRAYS/PACK) ×3 IMPLANT
WATER STERILE IRR 1000ML POUR (IV SOLUTION) ×6 IMPLANT

## 2022-02-05 NOTE — Anesthesia Preprocedure Evaluation (Signed)
Anesthesia Evaluation  ?Patient identified by MRN, date of birth, ID band ?Patient awake ? ? ? ?Reviewed: ?Allergy & Precautions, NPO status , Patient's Chart, lab work & pertinent test results ? ?History of Anesthesia Complications ?Negative for: history of anesthetic complications ? ?Airway ?Mallampati: III ? ?TM Distance: <3 FB ?Neck ROM: Full ? ? ? Dental ? ?(+) Edentulous Upper, Edentulous Lower, Dental Advisory Given ?  ?Pulmonary ?Current Smoker and Patient abstained from smoking.,  ?RUL ADENOCARCINOMA ?  ?breath sounds clear to auscultation ? ? ? ? ? ? Cardiovascular ?negative cardio ROS ? ? ?Rhythm:Regular  ? ?  ?Neuro/Psych ?negative neurological ROS ? negative psych ROS  ? GI/Hepatic ?negative GI ROS, (+)  ?  ? substance abuse ? alcohol use,   ?Endo/Other  ?negative endocrine ROS ? Renal/GU ?negative Renal ROSLab Results ?     Component                Value               Date                 ?     CREATININE               0.71                02/05/2022           ?  ? ?  ?Musculoskeletal ? ?(+) Arthritis ,  ? Abdominal ?  ?Peds ? Hematology ? ?(+) Blood dyscrasia, anemia , Lab Results ?     Component                Value               Date                 ?     WBC                      2.7 (L)             02/05/2022           ?     HGB                      11.9 (L)            02/05/2022           ?     HCT                      36.0 (L)            02/05/2022           ?     MCV                      90.2                02/05/2022           ?     PLT                      140 (L)             02/05/2022           ?   ?Anesthesia Other Findings ? ? Reproductive/Obstetrics ? ?  ? ? ? ? ? ? ? ? ? ? ? ? ? ?  ?  ? ? ? ? ? ? ? ? ?  Anesthesia Physical ?Anesthesia Plan ? ?ASA: 3 ? ?Anesthesia Plan: General  ? ?Post-op Pain Management: Toradol IV (intra-op)*  ? ?Induction: Intravenous ? ?PONV Risk Score and Plan: 1 and Ondansetron and Dexamethasone ? ?Airway Management Planned: Double  Lumen EBT ? ?Additional Equipment: None ? ?Intra-op Plan:  ? ?Post-operative Plan: Extubation in OR ? ?Informed Consent: I have reviewed the patients History and Physical, chart, labs and discussed the procedure including the risks, benefits and alternatives for the proposed anesthesia with the patient or authorized representative who has indicated his/her understanding and acceptance.  ? ? ? ?Dental advisory given ? ?Plan Discussed with: CRNA ? ?Anesthesia Plan Comments:   ? ? ? ? ? ? ?Anesthesia Quick Evaluation ? ?

## 2022-02-05 NOTE — Interval H&P Note (Signed)
History and Physical Interval Note: ? ?02/05/2022 ?7:41 AM ? ?[name]  has presented today for surgery, with the diagnosis of RUL ADENOCARCINOMA.  The various methods of treatment have been discussed with the patient and family. After consideration of risks, benefits and other options for treatment, the patient has consented to  Procedure(s): ?XI ROBOTIC ASSISTED THORASCOPY-RIGHT UPPER LOBECTOMY (Right) as a surgical intervention.  The patient's history has been reviewed, patient examined, no change in status, stable for surgery.  I have reviewed the patient's chart and labs.  Questions were answered to the patient's satisfaction.   ? ? ?[name] ? ? ?

## 2022-02-05 NOTE — Anesthesia Procedure Notes (Signed)
Arterial Line Insertion ?Start/End4/20/2023 7:05 AM, 02/05/2022 7:15 AM ?Performed by: Harden Mo, CRNA ? Patient location: Pre-op. ?Preanesthetic checklist: patient identified, IV checked, site marked, risks and benefits discussed, surgical consent, monitors and equipment checked, pre-op evaluation and anesthesia consent ?Lidocaine 1% used for infiltration ?Left, radial was placed ?Catheter size: 20 G ?Hand hygiene performed  and maximum sterile barriers used  ? ?Attempts: 1 ?Procedure performed without using ultrasound guided technique. ?Ultrasound Notes:anatomy identified, needle tip was noted to be adjacent to the nerve/plexus identified and no ultrasound evidence of intravascular and/or intraneural injection ?Following insertion, dressing applied and Biopatch. ?Post procedure assessment: normal and unchanged ? ?Patient tolerated the procedure well with no immediate complications. ? ? ?

## 2022-02-05 NOTE — Transfer of Care (Signed)
Immediate Anesthesia Transfer of Care Note ? ?Patient: [name] ? ?Procedure(s) Performed: XI ROBOTIC ASSISTED THORASCOPY-RIGHT UPPER LOBECTOMY (Right: Chest) ?INTERCOSTAL NERVE BLOCK (Right: Chest) ?NODE DISSECTION (Right: Chest) ? ?Patient Location: PACU ? ?Anesthesia Type:General ? ?Level of Consciousness: awake, alert  and oriented ? ?Airway & Oxygen Therapy: Patient Spontanous Breathing ? ?Post-op Assessment: Report given to RN, Post -op Vital signs reviewed and stable and Patient moving all extremities X 4 ? ?Post vital signs: Reviewed and stable ? ?Last Vitals:  ?Vitals Value Taken Time  ?BP 127/66   ?Temp    ?Pulse 79   ?Resp 12   ?SpO2 100   ? ? ?Last Pain:  ?Vitals:  ? 02/05/22 0622  ?TempSrc:   ?PainSc: 0-No pain  ?   ? ?  ? ?Complications: No notable events documented. ?

## 2022-02-05 NOTE — Plan of Care (Signed)
?  Problem: Education: ?Goal: Knowledge of General Education information will improve ?Description: Including pain rating scale, medication(s)/side effects and non-pharmacologic comfort measures ?Outcome: Progressing ?  ?Problem: Health Behavior/Discharge Planning: ?Goal: Ability to manage health-related needs will improve ?Outcome: Progressing ?  ?Problem: Coping: ?Goal: Level of anxiety will decrease ?Outcome: Progressing ?  ?Problem: Safety: ?Goal: Ability to remain free from injury will improve ?Outcome: Progressing ?  ?Problem: Skin Integrity: ?Goal: Risk for impaired skin integrity will decrease ?Outcome: Progressing ?  ?Problem: Education: ?Goal: Knowledge of disease or condition will improve ?Outcome: Progressing ?Goal: Knowledge of the prescribed therapeutic regimen will improve ?Outcome: Progressing ?  ?Problem: Activity: ?Goal: Risk for activity intolerance will decrease ?Outcome: Progressing ?  ?Problem: Cardiac: ?Goal: Will achieve and/or maintain hemodynamic stability ?Outcome: Progressing ?  ?Problem: Pain Managment: ?Goal: General experience of comfort will improve ?Outcome: Not Progressing ?  ?Problem: Pain Management: ?Goal: Pain level will decrease ?Outcome: Not Progressing ?  ?

## 2022-02-05 NOTE — Op Note (Signed)
NAME: [name], [name]. ?MEDICAL RECORD NO: [id number] ?ACCOUNT NO: [id number] ?DATE OF BIRTH: 03/22/1961 ?FACILITY: [facility] ?LOCATION: [facility] ?PHYSICIAN: Revonda Standard. Roxan Hockey, MD ? ?Operative Report  ? ?DATE OF PROCEDURE: 02/05/2022 ? ?PREOPERATIVE DIAGNOSIS:  Adenocarcinoma, right upper lobe, clinical stage IA (T1, N0). ? ?POSTOPERATIVE DIAGNOSIS:  Adenocarcinoma, right upper lobe, clinical stage IA (T1, N0). ? ?PROCEDURE:   ?Xi robotic-assisted right upper lobectomy,  ?Lymph node dissection and  ?Intercostal nerve blocks levels 3 through 10. ? ?SURGEON:  Revonda Standard. Roxan Hockey, MD ? ?ASSISTANT:  Jadene Pierini, PA ? ?ANESTHESIA:  General. ? ?FINDINGS:  Adhesions to apex. Enlarged but otherwise benign-appearing nodes.  Upper lobe PA branches essentially arose as a single common trunk.  Bronchial margin negative for tumor. ? ?CLINICAL NOTE:  [name] is a 61 year old gentleman with a history of tobacco abuse, alcoholism and left tonsillar cancer.  He was found to have a right upper lobe lung nodule which increased in size over time.  Navigational bronchoscopy showed  ?adenocarcinoma.  He was offered the option of surgical resection versus radiation.  He was felt to be a surgical candidate.  The indications, risks, benefits, and alternatives were discussed in detail with the patient.  He understood and accepted the  ?risks and agreed to proceed. ? ?OPERATIVE NOTE:  [name] was brought to the preoperative holding area on 02/05/2022.  Anesthesia placed a central venous catheter and an arterial blood pressure monitoring line.  He was taken to the operating room and anesthetized and intubated with  ?a double lumen endotracheal tube.  Intravenous antibiotics were administered.  A Foley catheter was placed.  Sequential compression devices were placed on the calves for DVT prophylaxis.  He was placed in the left lateral decubitus position and the right ? chest was prepped and draped in the usual sterile fashion.  Single lung  ventilation of the left lung was initiated and was tolerated well throughout the procedure. ? ?A timeout was performed.  A solution containing 20 mL of liposomal bupivacaine, 30 mL of 0.5% bupivacaine and 50 mL of saline was prepared.  This solution was used for local at the incisions as well as for the intercostal nerve blocks.  An incision was made in  ?the eighth interspace in the midaxillary line, and an 8 mm port was inserted.  After confirming intrapleural placement, carbon dioxide was insufflated per protocol.  A 12 mm port was inserted into the seventh interspace anterior to the camera port.   ?Intercostal nerve blocks then were performed from the third to the tenth interspace by injecting 10 mL of the bupivacaine solution into a subpleural plane at each level.  A 12 mm AirSeal port was placed in the tenth interspace and then two additional eighth ? interspace robotic ports were placed. The robot was deployed.  The camera arm was docked.  The camera was inserted, targeting was performed.  The remaining arms were docked.  The robotic instruments were inserted with thoracoscopic visualization.  ? There were adhesions to the apex.  These were taken down with bipolar cautery.  The dissection then was initiated starting at the inferior ligament.  No node was identified there.  The pleural reflection was divided at the hilum posteriorly and large, ? but otherwise benign-appearing level 8 and 7 nodes were removed.  Dissection was performed at the bifurcation of the upper lobe bronchus from the bronchus intermedius, but the node was not reachable from this point, it was removed later during the  ?procedure.  The  apex was retracted inferiorly and the pleural reflection was divided. A large, but otherwise benign-appearing 4R node was removed.  The pleural reflection was divided at the hilum anteriorly.  Attention was turned to the fissure.  The  ?pulmonary artery was identified near the confluence of the fissures and  a plane was developed.  The basilar and superior segmental branches of the lower lobe were identified and the major fissure was completed between the upper lobe and superior segment  ?with sequential firings of the robotic stapler.  The level 11 node then was visible and was removed.  There was a very small branch arising from the basilar segmental trunk going to the upper lobe, which was divided with the SynchroSeal device.  The  ?minor fissure then was divided, working from both an anterior and posterior approach.  This was a slow and tedious process, but once completed, the superior pulmonary vein branches from the upper lobe were easily encircled and divided with the robotic  ?stapler.  There was a single large arterial trunk arising from the main pulmonary artery supplying the upper lobe.  This was encircled and divided with the vascular stapler as well.  Finally, a stapler was placed across the right upper lobe bronchus at  ?its origin and closed.  A test inflation showed good aeration of the lower and middle lobes.  The stapler was fired transecting the right upper lobe bronchus.  The final firing of the stapler was used to connect the middle lobe to the lower lobe to  ?prevent torsion.  The sponges and vessel loop that had been placed during the dissection were removed.  The robotic instruments were removed.  The robot was undocked.  The anterior seventh interspace incision was lengthened to approximately 3 cm.  A 12  ?mm endoscopic retrieval bag was placed into the chest.  The upper lobe was manipulated into the bag and was removed and sent for frozen section of the bronchial margin, which returned with no tumor seen. ? ?The chest was copiously irrigated with warm saline.  A test inflation to 30 cm water revealed no leakage from the bronchial stump.  A 28-French Blake drain was placed through original port incision and directed to the apex.  It was secured with a #1 silk ? suture.  A final inspection was  made for hemostasis.  Dual lung ventilation was resumed.  The remaining incisions were closed in standard fashion.  Dermabond was applied.  The chest tube was placed to a Pleur-Evac on waterseal.  The patient was placed back in supine position.  He was extubated in  ?the operating room and taken to the postanesthetic care unit in good condition.  All sponge, needle and instrument counts were correct at the end of the procedure. ? ?Experienced assistance was necessary for this case due to complexity and surgical standard of care.  Jadene Pierini, [place] served as Licensed conveyancer, assisting with port placement, instrument exchange, specimen retrieval, and wound closure. ? ? ?NIK ?D: 02/05/2022 5:07:30 pm T: 02/05/2022 11:56:00 pm  ?JOB: [id number]/ [id number]  ?

## 2022-02-05 NOTE — Anesthesia Procedure Notes (Signed)
Procedure Name: Intubation ?Date/Time: 02/05/2022 8:17 AM ?Performed by: Harden Mo, CRNA ?Pre-anesthesia Checklist: Patient identified, Emergency Drugs available, Suction available and Patient being monitored ?Patient Re-evaluated:Patient Re-evaluated prior to induction ?Oxygen Delivery Method: Circle System Utilized ?Preoxygenation: Pre-oxygenation with 100% oxygen ?Induction Type: IV induction ?Ventilation: Mask ventilation without difficulty and Oral airway inserted - appropriate to patient size ?Laryngoscope Size: Sabra Heck and 2 ?Grade View: Grade II ?Tube type: Oral ?Endobronchial tube: Double lumen EBT, EBT position confirmed by auscultation and EBT position confirmed by fiberoptic bronchoscope and 37 Fr ?Number of attempts: 1 ?Airway Equipment and Method: Stylet and Oral airway ?Placement Confirmation: ETT inserted through vocal cords under direct vision, positive ETCO2 and breath sounds checked- equal and bilateral ?Tube secured with: Tape ?Dental Injury: Teeth and Oropharynx as per pre-operative assessment  ? ? ? ? ?

## 2022-02-05 NOTE — Brief Op Note (Addendum)
02/05/2022 ? ?11:32 AM ? ?PATIENT:  [name]  61 y.o. male ? ?PRE-OPERATIVE DIAGNOSIS:  RUL ADENOCARCINOMA ? ?POST-OPERATIVE DIAGNOSIS:  RUL ADENOCARCINOMA ? ?PROCEDURE:  Procedure(s): ?XI ROBOTIC ASSISTED THORASCOPY-RIGHT UPPER LOBECTOMY (Right) ?INTERCOSTAL NERVE BLOCK (Right) ?NODE DISSECTION (Right) ? ?SURGEON:  Surgeon(s) and Role: ?   * [name], MD - Primary ? ?PHYSICIAN ASSISTANT: WAYNE GOLD PA-C  ? ?ANESTHESIA:   general ? ?EBL:  100 mL  ? ?BLOOD ADMINISTERED:none ? ?DRAINS: (41F) Blake drain(s) in the RIGHT HEMITHORAX   ? ?LOCAL MEDICATIONS USED:  MARCAINE    and OTHER EXPAREL ? ?SPECIMEN:  Source of Specimen:  RUL AND LN SAMPLES ? ?DISPOSITION OF SPECIMEN:  PATHOLOGY ? ?COUNTS:  YES ? ?TOURNIQUET:  * No tourniquets in log * ? ?DICTATION: .Other Dictation: Dictation Number PENDING ? ?PLAN OF CARE: Admit to inpatient  ? ?PATIENT DISPOSITION:  PACU - hemodynamically stable. ?  ?Delay start of Pharmacological VTE agent (>24hrs) due to surgical blood loss or risk of bleeding: yes ? ?COMPLICATIONS: NO KNOWN ? ?

## 2022-02-06 ENCOUNTER — Inpatient Hospital Stay (HOSPITAL_COMMUNITY): Payer: Medicaid Other

## 2022-02-06 ENCOUNTER — Encounter (HOSPITAL_COMMUNITY): Payer: Self-pay | Admitting: Thoracic Surgery (Cardiothoracic Vascular Surgery)

## 2022-02-06 LAB — BASIC METABOLIC PANEL
Anion gap: 7 (ref 5–15)
BUN: 8 mg/dL (ref 6–20)
CO2: 26 mmol/L (ref 22–32)
Calcium: 8.4 mg/dL — ABNORMAL LOW (ref 8.9–10.3)
Chloride: 101 mmol/L (ref 98–111)
Creatinine, Ser: 0.85 mg/dL (ref 0.61–1.24)
GFR, Estimated: >60 mL/min (ref >60)
Glucose, Bld: 165 mg/dL — ABNORMAL HIGH (ref 70–99)
Potassium: 3.8 mmol/L (ref 3.5–5.1)
Sodium: 134 mmol/L — ABNORMAL LOW (ref 135–145)

## 2022-02-06 LAB — SURGICAL PATHOLOGY

## 2022-02-06 NOTE — Progress Notes (Addendum)
? ?   ?[address] ?      [place] [address] ?            [id number]   ? ?  1 Day Post-Op Procedure(s) (LRB): ?XI ROBOTIC ASSISTED THORASCOPY-RIGHT UPPER LOBECTOMY (Right) ?INTERCOSTAL NERVE BLOCK (Right) ?NODE DISSECTION (Right) ?Subjective: ?Intermit pain, not too bad ? ?Objective: ?Vital signs in last 24 hours: ?Temp:  [97.8 ?F (36.6 ?C)-98.6 ?F (37 ?C)] 98.2 ?F (36.8 ?C) (04/21 0300) ?Pulse Rate:  [55-76] 58 (04/21 0300) ?Cardiac Rhythm: Normal sinus rhythm (04/20 1905) ?Resp:  [12-25] 20 (04/21 0300) ?BP: (108-144)/(75-96) 111/75 (04/21 0300) ?SpO2:  [97 %-100 %] 98 % (04/21 0300) ?Arterial Line BP: (107-147)/(57-91) 136/91 (04/20 1554) ? ?Hemodynamic parameters for last 24 hours: ?  ? ?Intake/Output from previous day: ?04/20 0701 - 04/21 0700 ?In: 3698.2 [P.O.:480; I.V.:2899.7; IV Piggyback:318.4] ?Out: 3016 [Urine:1325; Blood:100; Chest Tube:260] ?Intake/Output this shift: ?No intake/output data recorded. ? ?General appearance: alert, cooperative, and no distress ?Heart: regular rate and rhythm ?Lungs: coarse in upper airway ?Abdomen: minor distension, soft, non tender ?Extremities: PAS in place ?Wound: dressings CDI ? ?Lab Results: ?Recent Labs  ?  02/05/22 ?0109  ?WBC 2.7*  ?HGB 11.9*  ?HCT 36.0*  ?PLT 140*  ? ?BMET:  ?Recent Labs  ?  02/05/22 ?3235 02/06/22 ?5732  ?NA 140 134*  ?K 3.6 3.8  ?CL 105 101  ?CO2 25 26  ?GLUCOSE 105* 165*  ?BUN 6 8  ?CREATININE 0.71 0.85  ?CALCIUM 9.0 8.4*  ?  ?PT/INR:  ?Recent Labs  ?  02/05/22 ?2025  ?LABPROT 12.9  ?INR 1.0  ? ?ABG ?   ?Component Value Date/Time  ? PHART 7.41 02/05/2022 0629  ? HCO3 27.9 02/05/2022 0629  ? O2SAT 98.6 02/05/2022 0629  ? ?CBG (last 3)  ?No results for input(s): GLUCAP in the last 72 hours. ? ?Meds ?Scheduled Meds: ? acetaminophen  1,000 mg Oral Q6H  ? Or  ? acetaminophen (TYLENOL) oral liquid 160 mg/5 mL  1,000 mg Oral Q6H  ? bisacodyl  10 mg Oral Daily  ? enoxaparin (LOVENOX) injection  40 mg Subcutaneous K27C  ? folic acid   1 mg Oral Daily  ? ketorolac  15 mg Intravenous Q6H  ? multivitamin with minerals  1 tablet Oral Daily  ? senna-docusate  1 tablet Oral QHS  ? thiamine  100 mg Oral Daily  ? Or  ? thiamine  100 mg Intravenous Daily  ? ?Continuous Infusions: ? dextrose 5 % and 0.9% NaCl 100 mL/hr at 02/06/22 0535  ? ?PRN Meds:.albuterol, LORazepam **OR** LORazepam, ondansetron (ZOFRAN) IV, oxyCODONE, traMADol ? ?Xrays ?DG Chest 2 View ? ?Result Date: 02/05/2022 ?CLINICAL DATA:  Preop lung surgery EXAM: CHEST - 2 VIEW COMPARISON:  01/02/2022 FINDINGS: Normal cardiac silhouette. Lungs are hyperinflated. No effusion, infiltrate or pneumothorax. No acute osseous abnormality. IMPRESSION: Hyperinflated lungs.  No acute findings. Electronically Signed   By: Suzy Bouchard M.D.   On: 02/05/2022 08:09  ? ?DG Chest Port 1 View ? ?Result Date: 02/05/2022 ?CLINICAL DATA:  Status post lobectomy of RIGHT lung, chest tube in place. EXAM: PORTABLE CHEST 1 VIEW COMPARISON:  Chest x-ray from earlier same day. FINDINGS: RIGHT-sided chest tube in place with tip directed towards the medial aspects of the RIGHT lung apex. Pneumothorax at the RIGHT lung apex, small to moderate in size, possibly accentuated by emphysematous blebs at the RIGHT lung apex. LEFT lung is clear. No pleural effusion is seen. Heart size and mediastinal  contours are grossly stable. Mediastinal structures are in the midline. IMPRESSION: RIGHT apical pneumothorax, small to moderate in size (approximately 10-15 %), possibly accentuated by emphysematous blebs at the RIGHT lung apex. RIGHT-sided chest tube in place with tip directed towards the medial aspect of the RIGHT lung apex. These results will be called to the ordering clinician or representative by the Radiologist Assistant, and communication documented in the PACS or [employer]. Electronically Signed   By: Franki Cabot M.D.   On: 02/05/2022 12:46   ? ?Assessment/Plan: ?S/P Procedure(s) (LRB): ?XI ROBOTIC ASSISTED  THORASCOPY-RIGHT UPPER LOBECTOMY (Right) ?INTERCOSTAL NERVE BLOCK (Right) ?NODE DISSECTION (Right) ?POD#1 ? ?1 afeb, VSS , s BP 100's-140's, sinus rhythm/sinus brady ?2 sats good on RA ?3 CT 260 cc, a little bloody, - on 20 cm H2O suction ?4 good UOP, normal renal fxn ?5 CXR RUL space/pntx about 15 % by my estimation, ? Small left lateral pntx ?6 CBC- pending ?7 on CIWA, no evidence of DT's sofar ?8 push pulm toilet/rehab as able ? ? ? ? ? LOS: 1 day  ? ? ?John Giovanni ?02/06/2022 ?  ?Patient seen and examined, agree with above ?Minimal air leak- will change to water seal ?He has a small apical space on right, no pneumo on left ?Ambulate, SCD + enoxaparin ? ?Revonda Standard Roxan Hockey, MD ?[facility] ?[phone] ? ?

## 2022-02-06 NOTE — Hospital Course (Addendum)
HPI: 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 ? ?[name] is a 61 year old gentleman with a history of tobacco abuse, alcoholism, arthritis, and left tonsillar cancer.  He was treated with chemoradiation for his tonsillar cancer several years ago.  He was found to have a right upper lobe lung nodule in July 2022.  This was a subsolid nodule.  A follow-up CT in January showed an interval increase in size.  Dr. Valeta Harms performed navigational bronchoscopy on 01/02/2022.  Cytology showed adenocarcinoma. ? ?He also has a smaller subsolid nodule in the left upper lobe anteriorly.  That has not changed in size. ?  ?He denies any trouble swallowing.  He denies chest pain, pressure, tightness, or shortness of breath.  He rides a bike frequently.  He continues to smoke a pack of cigarettes daily.  He also drinks about a sixpack of beer a day. ? ?After reviewing the patient and his relevant studies Dr. Roxan Hockey recommended resection.  He was admitted this hospitalization electively for the procedure. ? ?Hospital course: ? ?The patient was admitted electively and taken to the OR on 02/05/2022 at which time he underwent robotic assisted right upper lobectomy with lymph node dissection and intercostal nerve blocks 3 through 10.  Initial frozen section showed the bronchial margin to be negative for tumor.  He tolerated the procedure well was taken to the postanesthesia care unit in stable condition. ? ?Postoperative hospital course: ? ?The patient is doing well.  Chest x-ray on postoperative day #1 shows a small right apical pneumothorax.  Chest tube was initially placed to 20 cm of water suction but transition to waterseal.  Chest x-ray remains stable with small apical space.  Small air leak has resolved tube was removed on postop day #3.  Renal function has remained within normal limits.  He has a stable expected acute blood loss anemia.  Pathology has revealed invasive acinar  adenocarcinoma.  There were no lymph nodes showing evidence of malignancy.  Please see the full report below.  Incisions are healing well without evidence of infection.  He has shown no evidence of postoperative DT's and was kept on a CIWA protocol.  Oxygen has been weaned and he maintains good saturations on room air.  Tolerating gradually increasing activities using standard postsurgical rehab protocols.  Overall, at the time of discharge the patient was felt to be quite stable. ?  ?

## 2022-02-06 NOTE — Discharge Summary (Addendum)
Physician Discharge Summary  ?Patient ID: ?[name] ?MRN: [id number] ?DOB/AGE: 60-Feb-1962 60 y.o. ?PCP is Nolene Ebbs, MD ?Referring Provider is Garner Nash, DO ?Admit date: 02/05/2022 ?Discharge date: 02/09/2022 ? 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 ?Discharge Diagnoses: Adenocarcinoma right upper lobe- Pathologic stage IA(T1b,N0) ?Principal Problem: ?  S/P lobectomy of lung ? ?Patient Active Problem List  ? Diagnosis Date Noted  ? S/P lobectomy of lung 02/05/2022  ? Lung nodule 01/02/2022  ? Right upper lobe pulmonary nodule 11/13/2021  ? Alcohol abuse 09/05/2021  ? History of colonic polyps 09/05/2021  ? Thrombocytopenia ([place]) 12/28/2018  ? Fever 10/22/2017  ? Leukopenia 10/22/2017  ? Thrush 10/22/2017  ? Severe protein-calorie malnutrition ([place]) 10/08/2017  ? Hyponatremia 10/07/2017  ? Anemia 10/07/2017  ? Neutropenic fever ([place]) 10/06/2017  ? Pancytopenia, acquired ([place]) 10/01/2017  ? Weight loss 10/01/2017  ? Alcohol withdrawal syndrome without complication ([place]) 27/74/1287  ? Cancer of tonsillar fossa ([place]) 08/03/2017  ?` ?Discharged Condition: good ?HPI: Mr. Hall was sent for consultation regarding an adenocarcinoma of the right upper lobe. ? ?Efton Thomley is a 61 year old gentleman with a history of tobacco abuse, alcoholism, arthritis, and left tonsillar cancer.  He was treated with chemoradiation for his tonsillar cancer several years ago.  He was found to have a right upper lobe lung nodule in July 2022.  This was a subsolid nodule.  A follow-up CT in January showed an interval increase in size.  Dr. Valeta Harms performed navigational bronchoscopy on 01/02/2022.  Cytology showed adenocarcinoma. ? ?He also has a smaller subsolid nodule in the left upper lobe anteriorly.  That has not changed in size. ?  ?He denies any trouble swallowing.  He denies chest pain, pressure, tightness, or shortness of breath.  He rides a bike frequently.  He continues to  smoke a pack of cigarettes daily.  He also drinks about a sixpack of beer a day. ? ?After reviewing the patient and his relevant studies Dr. Roxan Hockey recommended resection.  He was admitted this hospitalization electively for the procedure. ? ?Hospital course: ? ?The patient was admitted electively and taken to the OR on 02/05/2022 at which time he underwent robotic assisted right upper lobectomy with lymph node dissection and intercostal nerve blocks 3 through 10.  Initial frozen section showed the bronchial margin to be negative for tumor.  He tolerated the procedure well was taken to the postanesthesia care unit in stable condition. ? ?Postoperative hospital course: ? ?The patient is doing well.  Chest x-ray on postoperative day #1 shows a small right apical pneumothorax.  Chest tube was initially placed to 20 cm of water suction but transition to waterseal.  Chest x-ray remains stable with small apical space.  Small air leak has resolved tube was removed on postop day #3.  Renal function has remained within normal limits.  He has a stable expected acute blood loss anemia.  Pathology has revealed invasive acinar adenocarcinoma.  There were no lymph nodes showing evidence of malignancy.  Please see the full report below.  Incisions are healing well without evidence of infection.  He has shown no evidence of postoperative DT's and was kept on a CIWA protocol.  Oxygen has been weaned and he maintains good saturations on room air.  Tolerating gradually increasing activities using standard postsurgical rehab protocols.  Overall, at the time of discharge the patient was felt to be quite stable. ?  ? ?Consults: None ? ?Significant Diagnostic Studies:  ?DG  Chest 1 View ? ?Result Date: 02/08/2022 ?CLINICAL DATA:  Status post thoracic lymph node dissection. Follow-up pneumothorax. EXAM: CHEST  1 VIEW COMPARISON:  02/08/2022 at 5:53 a.m. FINDINGS: RIGHT thoracostomy tube has been removed. No residual pneumothorax noted.  RIGHT subcutaneous emphysema again noted. RIGHT thoracic surgical changes are again identified. No airspace disease or pleural effusion noted. Remote LEFT rib fractures are identified. IMPRESSION: RIGHT thoracostomy tube removal without pneumothorax. Electronically Signed   By: Margarette Canada M.D.   On: 02/08/2022 17:50  ? ?DG Chest 1 View ? ?Result Date: 02/08/2022 ?CLINICAL DATA:  Pneumothorax EXAM: CHEST  1 VIEW COMPARISON:  February 07, 2022 FINDINGS: Right chest tube is identified. There is probable minimal right apical pneumothorax decreased compared to prior exam. Right hemithorax subcutaneous air is noted. Left lung is clear. Chronic posttraumatic change of several upper left ribs unchanged. IMPRESSION: Right chest tube is identified. Probable minimal right apical pneumothorax decreased compared to prior exam. Electronically Signed   By: Abelardo Diesel M.D.   On: 02/08/2022 07:44  ? ?DG Chest 1 View ? ?Result Date: 02/07/2022 ?CLINICAL DATA:  Postop from thoracic lymph node dissection. Follow-up pneumothorax. Left tonsillar carcinoma. EXAM: CHEST  1 VIEW COMPARISON:  02/06/2022 FINDINGS: Right chest tube remains in place. A right apical pneumothorax, approximately 25% size, shows no significant change in size. Mild atelectasis again seen in the right lung base. Left lung is clear. Heart size is normal. Subcutaneous emphysema again noted throughout the right chest wall soft tissues. IMPRESSION: Stable right apical pneumothorax, approximately 25% size. Right chest tube remains in place. Mild right basilar atelectasis. Electronically Signed   By: Marlaine Hind M.D.   On: 02/07/2022 09:00  ? ?DG Chest 2 View ? ?Result Date: 02/05/2022 ?CLINICAL DATA:  Preop lung surgery EXAM: CHEST - 2 VIEW COMPARISON:  01/02/2022 FINDINGS: Normal cardiac silhouette. Lungs are hyperinflated. No effusion, infiltrate or pneumothorax. No acute osseous abnormality. IMPRESSION: Hyperinflated lungs.  No acute findings. Electronically Signed    By: Suzy Bouchard M.D.   On: 02/05/2022 08:09  ? ?DG Chest Port 1 View ? ?Result Date: 02/06/2022 ?CLINICAL DATA:  Status post lobectomy. EXAM: PORTABLE CHEST 1 VIEW COMPARISON:  February 05, 2022. FINDINGS: Stable cardiomediastinal silhouette. Left lung is clear. Right-sided chest tube is unchanged in position. Mildly increased right apical pneumothorax is noted. Bony thorax is unremarkable. IMPRESSION: Stable right-sided chest tube with mildly increased right apical pneumothorax. Electronically Signed   By: Marijo Conception M.D.   On: 02/06/2022 08:03  ? ?DG Chest Port 1 View ? ?Result Date: 02/05/2022 ?CLINICAL DATA:  Status post lobectomy of RIGHT lung, chest tube in place. EXAM: PORTABLE CHEST 1 VIEW COMPARISON:  Chest x-ray from earlier same day. FINDINGS: RIGHT-sided chest tube in place with tip directed towards the medial aspects of the RIGHT lung apex. Pneumothorax at the RIGHT lung apex, small to moderate in size, possibly accentuated by emphysematous blebs at the RIGHT lung apex. LEFT lung is clear. No pleural effusion is seen. Heart size and mediastinal contours are grossly stable. Mediastinal structures are in the midline. IMPRESSION: RIGHT apical pneumothorax, small to moderate in size (approximately 10-15 %), possibly accentuated by emphysematous blebs at the RIGHT lung apex. RIGHT-sided chest tube in place with tip directed towards the medial aspect of the RIGHT lung apex. These results will be called to the ordering clinician or representative by the Radiologist Assistant, and communication documented in the PACS or [employer]. Electronically Signed   By: Cherlynn Kaiser  Enriqueta Shutter M.D.   On: 02/05/2022 12:46    ?Treatments: surgery:  ?Operative Report  ?  ?DATE OF PROCEDURE: 02/05/2022 ?  ?PREOPERATIVE DIAGNOSIS:  Adenocarcinoma, right upper lobe, clinical stage IA (T1, N0). ?  ?POSTOPERATIVE DIAGNOSIS:  Adenocarcinoma, right upper lobe, clinical stage IA (T1, N0). ?  ?PROCEDURE:  Xi robotic-assisted right  upper lobectomy, lymph node dissection and intercostal nerve blocks levels 3 through 10. ?  ?SURGEON:  Revonda Standard. Roxan Hockey, MD ?  ?ASSISTANT:  Jadene Pierini, PA ?  ?ANESTHESIA:  General. ?  ?FINDINGS:  [place]

## 2022-02-07 ENCOUNTER — Inpatient Hospital Stay (HOSPITAL_COMMUNITY): Payer: Medicaid Other

## 2022-02-07 LAB — CBC
HCT: 28.2 % — ABNORMAL LOW (ref 39.0–52.0)
Hemoglobin: 9.2 g/dL — ABNORMAL LOW (ref 13.0–17.0)
MCH: 29.1 pg (ref 26.0–34.0)
MCHC: 32.6 g/dL (ref 30.0–36.0)
MCV: 89.2 fL (ref 80.0–100.0)
Platelets: 108 10*3/uL — ABNORMAL LOW (ref 150–400)
RBC: 3.16 MIL/uL — ABNORMAL LOW (ref 4.22–5.81)
RDW: 15.3 % (ref 11.5–15.5)
WBC: 3.8 10*3/uL — ABNORMAL LOW (ref 4.0–10.5)
nRBC: 0 % (ref 0.0–0.2)

## 2022-02-07 LAB — COMPREHENSIVE METABOLIC PANEL
ALT: 12 U/L (ref 0–44)
AST: 27 U/L (ref 15–41)
Albumin: 3 g/dL — ABNORMAL LOW (ref 3.5–5.0)
Alkaline Phosphatase: 36 U/L — ABNORMAL LOW (ref 38–126)
Anion gap: 5 (ref 5–15)
BUN: 7 mg/dL (ref 6–20)
CO2: 29 mmol/L (ref 22–32)
Calcium: 8.4 mg/dL — ABNORMAL LOW (ref 8.9–10.3)
Chloride: 102 mmol/L (ref 98–111)
Creatinine, Ser: 0.74 mg/dL (ref 0.61–1.24)
GFR, Estimated: >60 mL/min (ref >60)
Glucose, Bld: 119 mg/dL — ABNORMAL HIGH (ref 70–99)
Potassium: 3.9 mmol/L (ref 3.5–5.1)
Sodium: 136 mmol/L (ref 135–145)
Total Bilirubin: 0.3 mg/dL (ref 0.3–1.2)
Total Protein: 5.3 g/dL — ABNORMAL LOW (ref 6.5–8.1)

## 2022-02-07 NOTE — Plan of Care (Signed)
Discussed with patient plan of care for the evening, pain management and chest tube with some teach back displayed. ? ?Problem: Education: ?Goal: Knowledge of General Education information will improve ?Description: Including pain rating scale, medication(s)/side effects and non-pharmacologic comfort measures ?Outcome: Progressing ?  ?Problem: Health Behavior/Discharge Planning: ?Goal: Ability to manage health-related needs will improve ?Outcome: Progressing ?  ?

## 2022-02-07 NOTE — Progress Notes (Signed)
Pt had coughing spell. Endorsed that he choked on his coffee. Pt able to clear airway on his on. Now stable. Will monitor for signs of aspiration. CVTS Gold PA-C notified by secure chat @[phone] . ?

## 2022-02-07 NOTE — Progress Notes (Addendum)
? ?   ?[address] ?      [place] [address] ?            [phone]   ? ?  2 Days Post-Op Procedure(s) (LRB): ?XI ROBOTIC ASSISTED THORASCOPY-RIGHT UPPER LOBECTOMY (Right) ?INTERCOSTAL NERVE BLOCK (Right) ?NODE DISSECTION (Right) ?Subjective: ?Feels ok, just "stiff" from being in bed  ? ?Objective: ?Vital signs in last 24 hours: ?Temp:  [98.2 ?F (36.8 ?C)-98.4 ?F (36.9 ?C)] 98.2 ?F (36.8 ?C) (04/22 6213) ?Pulse Rate:  [52-60] 52 (04/22 0807) ?Cardiac Rhythm: Sinus bradycardia;Heart block (04/22 0719) ?Resp:  [15-27] 18 (04/22 0865) ?BP: (103-121)/(72-77) 117/77 (04/22 7846) ?SpO2:  [96 %-98 %] 98 % (04/22 0807) ? ?Hemodynamic parameters for last 24 hours: ?  ? ?Intake/Output from previous day: ?04/21 0701 - 04/22 0700 ?In: 1108.1 [P.O.:480; I.V.:628.1] ?Out: 2045 [Urine:1975; Chest Tube:70] ?Intake/Output this shift: ?Total I/O ?In: 240 [P.O.:240] ?Out: 130 [Chest Tube:130] ? ?General appearance: alert, cooperative, and no distress ?Heart: regular rate and rhythm ?Lungs: clear to auscultation bilaterally ?Abdomen: benign ?Extremities: no edema or calf tenderness ?Wound: incis healing well ? ?Lab Results: ?Recent Labs  ?  02/05/22 ?9629 02/07/22 ?0106  ?WBC 2.7* 3.8*  ?HGB 11.9* 9.2*  ?HCT 36.0* 28.2*  ?PLT 140* 108*  ? ?BMET:  ?Recent Labs  ?  02/06/22 ?5284 02/07/22 ?0106  ?NA 134* 136  ?K 3.8 3.9  ?CL 101 102  ?CO2 26 29  ?GLUCOSE 165* 119*  ?BUN 8 7  ?CREATININE 0.85 0.74  ?CALCIUM 8.4* 8.4*  ?  ?PT/INR:  ?Recent Labs  ?  02/05/22 ?1324  ?LABPROT 12.9  ?INR 1.0  ? ?ABG ?   ?Component Value Date/Time  ? PHART 7.41 02/05/2022 0629  ? HCO3 27.9 02/05/2022 0629  ? O2SAT 98.6 02/05/2022 0629  ? ?CBG (last 3)  ?No results for input(s): GLUCAP in the last 72 hours. ? ?Meds ?Scheduled Meds: ? acetaminophen  1,000 mg Oral Q6H  ? Or  ? acetaminophen (TYLENOL) oral liquid 160 mg/5 mL  1,000 mg Oral Q6H  ? bisacodyl  10 mg Oral Daily  ? enoxaparin (LOVENOX) injection  40 mg Subcutaneous M01U  ? folic  acid  1 mg Oral Daily  ? ketorolac  15 mg Intravenous Q6H  ? multivitamin with minerals  1 tablet Oral Daily  ? senna-docusate  1 tablet Oral QHS  ? thiamine  100 mg Oral Daily  ? Or  ? thiamine  100 mg Intravenous Daily  ? ?Continuous Infusions: ? dextrose 5 % and 0.9% NaCl 10 mL/hr at 02/06/22 0853  ? ?PRN Meds:.albuterol, LORazepam **OR** LORazepam, ondansetron (ZOFRAN) IV, oxyCODONE, traMADol ? ?Xrays ?DG Chest 1 View ? ?Result Date: 02/07/2022 ?CLINICAL DATA:  Postop from thoracic lymph node dissection. Follow-up pneumothorax. Left tonsillar carcinoma. EXAM: CHEST  1 VIEW COMPARISON:  02/06/2022 FINDINGS: Right chest tube remains in place. A right apical pneumothorax, approximately 25% size, shows no significant change in size. Mild atelectasis again seen in the right lung base. Left lung is clear. Heart size is normal. Subcutaneous emphysema again noted throughout the right chest wall soft tissues. IMPRESSION: Stable right apical pneumothorax, approximately 25% size. Right chest tube remains in place. Mild right basilar atelectasis. Electronically Signed   By: Marlaine Hind M.D.   On: 02/07/2022 09:00  ? ?DG Chest Port 1 View ? ?Result Date: 02/06/2022 ?CLINICAL DATA:  Status post lobectomy. EXAM: PORTABLE CHEST 1 VIEW COMPARISON:  February 05, 2022. FINDINGS: Stable cardiomediastinal silhouette. Left lung is  clear. Right-sided chest tube is unchanged in position. Mildly increased right apical pneumothorax is noted. Bony thorax is unremarkable. IMPRESSION: Stable right-sided chest tube with mildly increased right apical pneumothorax. Electronically Signed   By: Marijo Conception M.D.   On: 02/06/2022 08:03  ? ?DG Chest Port 1 View ? ?Result Date: 02/05/2022 ?CLINICAL DATA:  Status post lobectomy of RIGHT lung, chest tube in place. EXAM: PORTABLE CHEST 1 VIEW COMPARISON:  Chest x-ray from earlier same day. FINDINGS: RIGHT-sided chest tube in place with tip directed towards the medial aspects of the RIGHT lung apex.  Pneumothorax at the RIGHT lung apex, small to moderate in size, possibly accentuated by emphysematous blebs at the RIGHT lung apex. LEFT lung is clear. No pleural effusion is seen. Heart size and mediastinal contours are grossly stable. Mediastinal structures are in the midline. IMPRESSION: RIGHT apical pneumothorax, small to moderate in size (approximately 10-15 %), possibly accentuated by emphysematous blebs at the RIGHT lung apex. RIGHT-sided chest tube in place with tip directed towards the medial aspect of the RIGHT lung apex. These results will be called to the ordering clinician or representative by the Radiologist Assistant, and communication documented in the PACS or [employer]. Electronically Signed   By: Franki Cabot M.D.   On: 02/05/2022 12:46   ? ?Assessment/Plan: ?S/P Procedure(s) (LRB): ?XI ROBOTIC ASSISTED THORASCOPY-RIGHT UPPER LOBECTOMY (Right) ?INTERCOSTAL NERVE BLOCK (Right) ?NODE DISSECTION (Right) ?POD#2 ? ?1 afebrile , VSS ?2 sats good on RA ?3 good UOP, voiding, not all measured, normal renal fxn ?4 CT 70 cc yesterday, 130 cc thus far today . + moderate sized air leak- keep in place ?5 expected ABLA not at transfusion threshold ?6 protein cal malnutrition based on albumin and rotein stores being low ?7 CXR with stable right pntx, may need to consider placing back on suction ?8 routine pulm hygiene and rehab ?9 no evidense of DT's, alcohol withdrawel ? ? LOS: 2 days  ? ? ?John Giovanni ?02/07/2022 ?  ? Chart reviewed, patient examined, agree with above. ?He has an air leak but CXR stable with apical space from removal of upper lobe. Lower and middle lobes fully expanded. Continue to water seal today. ? ?

## 2022-02-07 NOTE — Plan of Care (Signed)
?  Problem: Coping: ?Goal: Level of anxiety will decrease ?Outcome: Not Progressing - pt endorsed 10/10 pain level. Anxiety may be directly related.  ?  ?Problem: Education: ?Goal: Knowledge of General Education information will improve ?Description: Including pain rating scale, medication(s)/side effects and non-pharmacologic comfort measures ?Outcome: Progressing ?  ?Problem: Health Behavior/Discharge Planning: ?Goal: Ability to manage health-related needs will improve ?Outcome: Progressing ?  ?Problem: Clinical Measurements: ?Goal: Ability to maintain clinical measurements within normal limits will improve ?Outcome: Progressing ?Goal: Will remain free from infection ?Outcome: Progressing ?Goal: Diagnostic test results will improve ?Outcome: Progressing ?Goal: Respiratory complications will improve ?Outcome: Progressing ?Goal: Cardiovascular complication will be avoided ?Outcome: Progressing ?  ?Problem: Activity: ?Goal: Risk for activity intolerance will decrease ?Outcome: Progressing ?  ?Problem: Nutrition: ?Goal: Adequate nutrition will be maintained ?Outcome: Progressing ?  ?Problem: Elimination: ?Goal: Will not experience complications related to bowel motility ?Outcome: Progressing ?Goal: Will not experience complications related to urinary retention ?Outcome: Progressing ?  ?Problem: Pain Managment: ?Goal: General experience of comfort will improve ?Outcome: Progressing ?  ?Problem: Safety: ?Goal: Ability to remain free from injury will improve ?Outcome: Progressing ?  ?Problem: Skin Integrity: ?Goal: Risk for impaired skin integrity will decrease ?Outcome: Progressing ?  ?Problem: Education: ?Goal: Knowledge of disease or condition will improve ?Outcome: Progressing ?Goal: Knowledge of the prescribed therapeutic regimen will improve ?Outcome: Progressing ?  ?Problem: Activity: ?Goal: Risk for activity intolerance will decrease ?Outcome: Progressing ?  ?Problem: Cardiac: ?Goal: Will achieve and/or maintain  hemodynamic stability ?Outcome: Progressing ?  ?Problem: Clinical Measurements: ?Goal: Postoperative complications will be avoided or minimized ?Outcome: Progressing ?  ?Problem: Respiratory: ?Goal: Respiratory status will improve ?Outcome: Progressing ?  ?Problem: Pain Management: ?Goal: Pain level will decrease ?Outcome: Progressing ?  ?Problem: Skin Integrity: ?Goal: Wound healing without signs and symptoms infection will improve ?Outcome: Progressing ?  ?

## 2022-02-08 ENCOUNTER — Inpatient Hospital Stay (HOSPITAL_COMMUNITY): Payer: Medicaid Other

## 2022-02-08 NOTE — Anesthesia Postprocedure Evaluation (Signed)
Anesthesia Post Note ? ?Patient: [name] ? ?Procedure(s) Performed: XI ROBOTIC ASSISTED THORASCOPY-RIGHT UPPER LOBECTOMY (Right: Chest) ?INTERCOSTAL NERVE BLOCK (Right: Chest) ?NODE DISSECTION (Right: Chest) ? ?  ? ?Patient location during evaluation: PACU ?Anesthesia Type: General ?Level of consciousness: awake and alert ?Pain management: pain level controlled ?Vital Signs Assessment: post-procedure vital signs reviewed and stable ?Respiratory status: spontaneous breathing, nonlabored ventilation, respiratory function stable and patient connected to nasal cannula oxygen ?Cardiovascular status: blood pressure returned to baseline and stable ?Postop Assessment: no apparent nausea or vomiting ?Anesthetic complications: no ? ? ?No notable events documented. ? ?Last Vitals:  ?Vitals:  ? 02/08/22 0755 02/08/22 1132  ?BP: 109/75 140/82  ?Pulse: (!) 55 (!) 55  ?Resp: 19 20  ?Temp: 36.8 ?C 36.4 ?C  ?SpO2: 99% 97%  ?  ?Last Pain:  ?Vitals:  ? 02/08/22 1132  ?TempSrc: Oral  ?PainSc:   ? ? ?  ?  ?  ?  ?  ?  ? ?Sedale Jenifer ? ? ? ? ?

## 2022-02-08 NOTE — Progress Notes (Signed)
Xray paged for 1 hour post removal imaging.  ?

## 2022-02-08 NOTE — Progress Notes (Addendum)
? ?   ?[address] ?      [place] [address] ?            [phone]   ? ?  3 Days Post-Op Procedure(s) (LRB): ?XI ROBOTIC ASSISTED THORASCOPY-RIGHT UPPER LOBECTOMY (Right) ?INTERCOSTAL NERVE BLOCK (Right) ?NODE DISSECTION (Right) ?Subjective: ?Feels ok ? ?Objective: ?Vital signs in last 24 hours: ?Temp:  [97.8 ?F (36.6 ?C)-98.6 ?F (37 ?C)] 98.2 ?F (36.8 ?C) (04/23 0755) ?Pulse Rate:  [50-58] 55 (04/23 0755) ?Cardiac Rhythm: Normal sinus rhythm (04/23 0724) ?Resp:  [17-20] 19 (04/23 0755) ?BP: (109-143)/(68-90) 109/75 (04/23 0755) ?SpO2:  [95 %-100 %] 99 % (04/23 0755) ? ?Hemodynamic parameters for last 24 hours: ?  ? ?Intake/Output from previous day: ?04/22 0701 - 04/23 0700 ?In: 22 [P.O.:660] ?Out: 1855 [Urine:1625; Chest Tube:230] ?Intake/Output this shift: ?No intake/output data recorded. ? ?General appearance: alert, cooperative, and no distress ?Heart: regular rate and rhythm ?Lungs: dim in bases ?Abdomen: benign ?Extremities: no edema or calf tenderness ?Wound: incis healing well ? ?Lab Results: ?Recent Labs  ?  02/07/22 ?0106  ?WBC 3.8*  ?HGB 9.2*  ?HCT 28.2*  ?PLT 108*  ? ?BMET:  ?Recent Labs  ?  02/06/22 ?1517 02/07/22 ?0106  ?NA 134* 136  ?K 3.8 3.9  ?CL 101 102  ?CO2 26 29  ?GLUCOSE 165* 119*  ?BUN 8 7  ?CREATININE 0.85 0.74  ?CALCIUM 8.4* 8.4*  ?  ?PT/INR: No results for input(s): LABPROT, INR in the last 72 hours. ?ABG ?   ?Component Value Date/Time  ? PHART 7.41 02/05/2022 0629  ? HCO3 27.9 02/05/2022 0629  ? O2SAT 98.6 02/05/2022 0629  ? ?CBG (last 3)  ?No results for input(s): GLUCAP in the last 72 hours. ? ?Meds ?Scheduled Meds: ? acetaminophen  1,000 mg Oral Q6H  ? Or  ? acetaminophen (TYLENOL) oral liquid 160 mg/5 mL  1,000 mg Oral Q6H  ? bisacodyl  10 mg Oral Daily  ? enoxaparin (LOVENOX) injection  40 mg Subcutaneous O16W  ? folic acid  1 mg Oral Daily  ? multivitamin with minerals  1 tablet Oral Daily  ? senna-docusate  1 tablet Oral QHS  ? thiamine  100 mg Oral Daily  ?  Or  ? thiamine  100 mg Intravenous Daily  ? ?Continuous Infusions: ? dextrose 5 % and 0.9% NaCl 10 mL/hr at 02/06/22 0853  ? ?PRN Meds:.albuterol, LORazepam **OR** LORazepam, ondansetron (ZOFRAN) IV, oxyCODONE, traMADol ? ?Xrays ?DG Chest 1 View ? ?Result Date: 02/08/2022 ?CLINICAL DATA:  Pneumothorax EXAM: CHEST  1 VIEW COMPARISON:  February 07, 2022 FINDINGS: Right chest tube is identified. There is probable minimal right apical pneumothorax decreased compared to prior exam. Right hemithorax subcutaneous air is noted. Left lung is clear. Chronic posttraumatic change of several upper left ribs unchanged. IMPRESSION: Right chest tube is identified. Probable minimal right apical pneumothorax decreased compared to prior exam. Electronically Signed   By: Abelardo Diesel M.D.   On: 02/08/2022 07:44  ? ?DG Chest 1 View ? ?Result Date: 02/07/2022 ?CLINICAL DATA:  Postop from thoracic lymph node dissection. Follow-up pneumothorax. Left tonsillar carcinoma. EXAM: CHEST  1 VIEW COMPARISON:  02/06/2022 FINDINGS: Right chest tube remains in place. A right apical pneumothorax, approximately 25% size, shows no significant change in size. Mild atelectasis again seen in the right lung base. Left lung is clear. Heart size is normal. Subcutaneous emphysema again noted throughout the right chest wall soft tissues. IMPRESSION: Stable right apical pneumothorax, approximately 25% size.  Right chest tube remains in place. Mild right basilar atelectasis. Electronically Signed   By: Marlaine Hind M.D.   On: 02/07/2022 09:00   ? ?Assessment/Plan: ?S/P Procedure(s) (LRB): ?XI ROBOTIC ASSISTED THORASCOPY-RIGHT UPPER LOBECTOMY (Right) ?INTERCOSTAL NERVE BLOCK (Right) ?NODE DISSECTION (Right) ? ?POD#3 ? ?1 afeb, VSS, sinus rhythm. Some PVC's ?2 sats good on RA ?3 CT 230 cc- no air leak ?4 good UOP ?5 CXR minimal apical pntx ?6 d/c chest tube today ?7 cont pulm hygiene and rehab ?8 lovenox for DVT ppx  ?9 repeat labs in am ?10 no evidence of DT's ?11  poss d/c in am ? ? LOS: 3 days  ? ? ?John Giovanni PA-C ?Pager [phone] ?02/08/2022 ?  ? Chart reviewed, patient examined, agree with above. ?CXR shows small apical space. No air leak with coughing. Plan to remove tube and send home tomorrow. ?

## 2022-02-09 ENCOUNTER — Inpatient Hospital Stay (HOSPITAL_COMMUNITY): Payer: Medicaid Other

## 2022-02-09 LAB — BASIC METABOLIC PANEL
Anion gap: 5 (ref 5–15)
BUN: 5 mg/dL — ABNORMAL LOW (ref 6–20)
CO2: 29 mmol/L (ref 22–32)
Calcium: 9.1 mg/dL (ref 8.9–10.3)
Chloride: 101 mmol/L (ref 98–111)
Creatinine, Ser: 0.73 mg/dL (ref 0.61–1.24)
GFR, Estimated: >60 mL/min (ref >60)
Glucose, Bld: 115 mg/dL — ABNORMAL HIGH (ref 70–99)
Potassium: 4.3 mmol/L (ref 3.5–5.1)
Sodium: 135 mmol/L (ref 135–145)

## 2022-02-09 LAB — CBC
HCT: 29.1 % — ABNORMAL LOW (ref 39.0–52.0)
Hemoglobin: 9.5 g/dL — ABNORMAL LOW (ref 13.0–17.0)
MCH: 29.6 pg (ref 26.0–34.0)
MCHC: 32.6 g/dL (ref 30.0–36.0)
MCV: 90.7 fL (ref 80.0–100.0)
Platelets: 124 10*3/uL — ABNORMAL LOW (ref 150–400)
RBC: 3.21 MIL/uL — ABNORMAL LOW (ref 4.22–5.81)
RDW: 15 % (ref 11.5–15.5)
WBC: 4.4 10*3/uL (ref 4.0–10.5)
nRBC: 0 % (ref 0.0–0.2)

## 2022-02-09 MED ORDER — OXYCODONE HCL 5 MG PO TABS
5.0000 mg | ORAL_TABLET | ORAL | 0 refills | Status: AC | PRN
Start: 2022-02-09 — End: 2022-02-14

## 2022-02-09 NOTE — Progress Notes (Signed)
Reviewed discharge paperwork with pt. Pt states he understands instructions. Pt discharged home. ?

## 2022-02-09 NOTE — Progress Notes (Addendum)
? ?   ?[address] ?      [place] [address] ?            [phone]   ? ?  4 Days Post-Op Procedure(s) (LRB): ?XI ROBOTIC ASSISTED THORASCOPY-RIGHT UPPER LOBECTOMY (Right) ?INTERCOSTAL NERVE BLOCK (Right) ?NODE DISSECTION (Right) ?Subjective: ?Feels well with no new concerns.  Wants to go home.  ? ?Objective: ?Vital signs in last 24 hours: ?Temp:  [97.6 ?F (36.4 ?C)-98.2 ?F (36.8 ?C)] 97.9 ?F (36.6 ?C) (04/24 0749) ?Pulse Rate:  [50-58] 58 (04/24 0749) ?Cardiac Rhythm: Sinus bradycardia (04/23 1911) ?Resp:  [14-20] 16 (04/24 0749) ?BP: (121-150)/(82-91) 150/88 (04/24 0749) ?SpO2:  [97 %-99 %] 99 % (04/24 0749) ? ?Hemodynamic parameters for last 24 hours: ?  ? ?Intake/Output from previous day: ?04/23 0701 - 04/24 0700 ?In: 240 [P.O.:240] ?Out: -  ?Intake/Output this shift: ?No intake/output data recorded. ? ?General appearance: alert, cooperative, and no distress ?Heart: regular rate and rhythm ?Lungs: breath sounds are clear, CXR stable ?Abdomen: benign ?Extremities: no edema or calf tenderness ?Wound: incisions and CT exit site are dry.  ? ?Lab Results: ?Recent Labs  ?  02/07/22 ?0106 02/09/22 ?0128  ?WBC 3.8* 4.4  ?HGB 9.2* 9.5*  ?HCT 28.2* 29.1*  ?PLT 108* 124*  ? ? ?BMET:  ?Recent Labs  ?  02/07/22 ?0106 02/09/22 ?0128  ?NA 136 135  ?K 3.9 4.3  ?CL 102 101  ?CO2 29 29  ?GLUCOSE 119* 115*  ?BUN 7 5*  ?CREATININE 0.74 0.73  ?CALCIUM 8.4* 9.1  ? ?  ?PT/INR: No results for input(s): LABPROT, INR in the last 72 hours. ?ABG ?   ?Component Value Date/Time  ? PHART 7.41 02/05/2022 0629  ? HCO3 27.9 02/05/2022 0629  ? O2SAT 98.6 02/05/2022 0629  ? ?CBG (last 3)  ?No results for input(s): GLUCAP in the last 72 hours. ? ?Meds ?Scheduled Meds: ? acetaminophen  1,000 mg Oral Q6H  ? Or  ? acetaminophen (TYLENOL) oral liquid 160 mg/5 mL  1,000 mg Oral Q6H  ? bisacodyl  10 mg Oral Daily  ? enoxaparin (LOVENOX) injection  40 mg Subcutaneous B34L  ? folic acid  1 mg Oral Daily  ? multivitamin with minerals   1 tablet Oral Daily  ? senna-docusate  1 tablet Oral QHS  ? thiamine  100 mg Oral Daily  ? Or  ? thiamine  100 mg Intravenous Daily  ? ?Continuous Infusions: ? dextrose 5 % and 0.9% NaCl 10 mL/hr at 02/06/22 0853  ? ?PRN Meds:.albuterol, ondansetron (ZOFRAN) IV, oxyCODONE, traMADol ? ?Xrays ?DG Chest 1 View ? ?Result Date: 02/08/2022 ?CLINICAL DATA:  Status post thoracic lymph node dissection. Follow-up pneumothorax. EXAM: CHEST  1 VIEW COMPARISON:  02/08/2022 at 5:53 a.m. FINDINGS: RIGHT thoracostomy tube has been removed. No residual pneumothorax noted. RIGHT subcutaneous emphysema again noted. RIGHT thoracic surgical changes are again identified. No airspace disease or pleural effusion noted. Remote LEFT rib fractures are identified. IMPRESSION: RIGHT thoracostomy tube removal without pneumothorax. Electronically Signed   By: Margarette Canada M.D.   On: 02/08/2022 17:50  ? ?DG Chest 1 View ? ?Result Date: 02/08/2022 ?CLINICAL DATA:  Pneumothorax EXAM: CHEST  1 VIEW COMPARISON:  February 07, 2022 FINDINGS: Right chest tube is identified. There is probable minimal right apical pneumothorax decreased compared to prior exam. Right hemithorax subcutaneous air is noted. Left lung is clear. Chronic posttraumatic change of several upper left ribs unchanged. IMPRESSION: Right chest tube is identified. Probable minimal right  apical pneumothorax decreased compared to prior exam. Electronically Signed   By: Abelardo Diesel M.D.   On: 02/08/2022 07:44   ? ?Assessment/Plan: ?S/P Procedure(s) (LRB): ?XI ROBOTIC ASSISTED THORASCOPY-RIGHT UPPER LOBECTOMY (Right) ?INTERCOSTAL NERVE BLOCK (Right) ?NODE DISSECTION (Right) ? ?POD#4 right upper lobectomy. Stable respiratory status on room air post removal of the chest tube yesterday.  ?Independent with mobility. Plan discharge to home today.  ?Arranged follow up for suture removal in 1 week and follow up with Dr. Roxan Hockey on 5/9. ?Instructions given.  ? ? LOS: 4 days  ? ? ?Antony Odea  PA-C ?Pager [phone] ?02/09/2022 ?  ?Patient seen and examined, agree with above ?Looks great ?Home today ? ?Revonda Standard Roxan Hockey, MD ?[facility] ?[phone] ?

## 2022-02-09 NOTE — Discharge Instructions (Signed)

## 2022-02-12 ENCOUNTER — Other Ambulatory Visit: Payer: Self-pay | Admitting: *Deleted

## 2022-02-12 NOTE — Progress Notes (Signed)
The proposed treatment discussed in cancer conference is for discussion purpose only and is not a binding recommendation. The patient was not physically examined nor present for their treatment options. Therefore, final treatment plans cannot be decided.  ?

## 2022-02-16 ENCOUNTER — Ambulatory Visit: Payer: Self-pay

## 2022-02-23 ENCOUNTER — Other Ambulatory Visit: Payer: Self-pay | Admitting: Thoracic Surgery (Cardiothoracic Vascular Surgery)

## 2022-02-23 DIAGNOSIS — R911 Solitary pulmonary nodule: Secondary | ICD-10-CM

## 2022-02-23 DIAGNOSIS — Z902 Acquired absence of lung [part of]: Secondary | ICD-10-CM

## 2022-02-24 ENCOUNTER — Ambulatory Visit: Payer: Self-pay | Admitting: Thoracic Surgery (Cardiothoracic Vascular Surgery)

## 2022-03-10 ENCOUNTER — Ambulatory Visit: Payer: Self-pay | Admitting: Thoracic Surgery (Cardiothoracic Vascular Surgery)

## 2022-03-10 ENCOUNTER — Encounter: Payer: Self-pay | Admitting: Thoracic Surgery (Cardiothoracic Vascular Surgery)

## 2022-03-17 ENCOUNTER — Encounter: Payer: Self-pay | Admitting: *Deleted

## 2022-03-17 ENCOUNTER — Ambulatory Visit (INDEPENDENT_AMBULATORY_CARE_PROVIDER_SITE_OTHER): Payer: Self-pay | Admitting: Thoracic Surgery (Cardiothoracic Vascular Surgery)

## 2022-03-17 ENCOUNTER — Ambulatory Visit
Admission: RE | Admit: 2022-03-17 | Discharge: 2022-03-17 | Disposition: A | Discharge status: Home / Self Care | Payer: Medicaid Other | Source: Ambulatory Visit | Attending: Thoracic Surgery (Cardiothoracic Vascular Surgery) | Admitting: Thoracic Surgery (Cardiothoracic Vascular Surgery)

## 2022-03-17 ENCOUNTER — Telehealth: Payer: Self-pay | Admitting: Internal Medicine

## 2022-03-17 ENCOUNTER — Encounter: Payer: Self-pay | Admitting: Thoracic Surgery (Cardiothoracic Vascular Surgery)

## 2022-03-17 VITALS — BP 109/72 | HR 67 | Resp 20 | Ht 67.0 in | Wt 125.2 lb

## 2022-03-17 DIAGNOSIS — C090 Malignant neoplasm of tonsillar fossa: Secondary | ICD-10-CM

## 2022-03-17 DIAGNOSIS — Z902 Acquired absence of lung [part of]: Secondary | ICD-10-CM

## 2022-03-17 DIAGNOSIS — R911 Solitary pulmonary nodule: Secondary | ICD-10-CM

## 2022-03-17 MED ORDER — OXYCODONE HCL 5 MG PO TABS: 5.0000 mg | ORAL_TABLET | Freq: Two times a day (BID) | ORAL | 0 refills | Status: DC | PRN

## 2022-03-17 NOTE — Progress Notes (Signed)
Labs placed per protocol

## 2022-03-17 NOTE — Progress Notes (Signed)
Oncology Nurse Navigator Documentation     03/17/2022    3:00 PM 03/22/2020    8:21 AM 03/14/2019   10:17 AM 12/30/2018    1:52 PM 12/29/2018   10:47 AM 12/28/2018    3:50 PM 12/22/2018   10:05 AM  Oncology Nurse Navigator Flowsheets  Navigator Location [facility]  Referral Date to RadOnc/MedOnc 03/17/2022        Navigator Encounter Type Other: Other: Telephone Telephone Telephone Follow-up Appt Letter/Fax/Email  Telephone  Outgoing Call Outgoing Call;Appt Confirmation/Clarification Outgoing Call;Patient Update;Medication Assistance Outgoing Call;Appt Confirmation/Clarification    Patient Visit Type Other Follow-up    MedOnc   Treatment Phase Other Post-Tx Follow-up Post-Tx Follow-up Post-Tx Follow-up     Barriers/Navigation Needs Coordination of Care No Barriers At This Time   Coordination of Care    Education      Smoking cessation   Interventions Coordination of Care/I received referral on [name] today. I notified new patient coordinator to call and schedule him to be seen next week with Dr. Julien Nordmann.     Coordination of Care    Acuity Level 2-Minimal Needs (1-2 Barriers Identified) Level 1-No Barriers  Level 2 Level 2 Level 2   Coordination of Care Other        Time Spent with Patient 30 15 15 30 15  45 15

## 2022-03-17 NOTE — Telephone Encounter (Signed)
Scheduled appt per 5/30 referral . Pt is aware of appt date and time. Pt is aware to arrive 15 mins prior to appt time and to bring and updated insurance card. Pt is aware of appt location.   

## 2022-03-17 NOTE — Progress Notes (Signed)
[address]       [place] [address]             [phone]    HPI: [name] returns for a scheduled follow-up visit  [name] is a 61 year old gentleman with a history of tobacco abuse, alcoholism, arthritis, and tonsillar cancer.  He had a subsolid right upper lobe lung nodule that increased in size over time.  Navigational bronchoscopy showed adenocarcinoma.  I did a robotic assisted right upper lobectomy and node dissection on 02/05/2022.  Pathology showed a 1.1 cm adenocarcinoma.  All lymph nodes were negative.  His postoperative course was unremarkable and he went home on day 4.  He still is having some pain.  He is not having any paresthesias.  He ran out of oxycodone and is requesting more.  No significant respiratory issues.  Past Medical History:  Diagnosis Date   Alcoholism ([place])    Arthritis    Cancer ([place])    left tonsil cancer   History of chemotherapy    History of radiation therapy 09/01/17- 10/25/17   Left tonsil and bilateral neck 70 Gy in 35 fractions to gross disease, 63 gy in 35 fractions to high risk nodal echelons, and 56 Gy in 35 fraction to intermediate risk nodal echelons.      Current Outpatient Medications  Medication Sig Dispense Refill   acetaminophen (TYLENOL) 500 MG tablet Take 1,000 mg by mouth every 6 (six) hours as needed for mild pain.     ibuprofen (ADVIL) 200 MG tablet Take 400 mg by mouth every 6 (six) hours as needed for mild pain.     oxyCODONE (OXY IR/ROXICODONE) 5 MG immediate release tablet Take 1 tablet (5 mg total) by mouth 2 (two) times daily as needed for severe pain. 30 tablet 0   tetrahydrozoline 0.05 % ophthalmic solution Place 1 drop into both eyes daily as needed (allergy).     No current facility-administered medications for this visit.   Facility-Administered Medications Ordered in Other Visits  Medication Dose Route Frequency Provider Last Rate Last Admin   lactated ringers infusion    Intravenous Continuous [name], MD        Physical Exam BP 109/72 (BP Location: Right Arm, Patient Position: Sitting, Cuff Size: Normal)   Pulse 67   Resp 20   Ht 5\' 7"  (1.702 m)   Wt 125 lb 3.2 oz (56.8 kg)   SpO2 97% Comment: RA  BMI 19.24 kg/m  61 year old man in no acute distress Alert and oriented x3 with no focal deficits Incisions clean dry and intact Lungs slightly diminished at right base but otherwise clear Cardiac regular rate and rhythm No peripheral edema  Diagnostic Tests: I personally reviewed his chest x-ray.  It shows postoperative changes.  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  He currently is doing well.  He does still have some incisional pain.  I gave him another prescription for oxycodone 5 mg tablets, 1 1 p.o. twice daily as needed, 30 tablets, no refills.  There are no restrictions on his activities at this point as surgery was a little over a month ago.  I am going to arrange for an appointment with Dr. Julien Nordmann for follow-up.  He does not need any adjuvant therapy.  Plan: Return in 3 months with PA and lateral chest x-ray to check on progress  Christian Nakayama, MD [facility] [phone]

## 2022-03-19 ENCOUNTER — Other Ambulatory Visit: Payer: Self-pay | Admitting: Thoracic Surgery (Cardiothoracic Vascular Surgery)

## 2022-03-19 MED ORDER — OXYCODONE HCL 5 MG PO TABS: 5.0000 mg | ORAL_TABLET | Freq: Three times a day (TID) | ORAL | 0 refills | Status: DC | PRN

## 2022-03-19 NOTE — Progress Notes (Signed)
Prescription for oxycodone 5 mg PO TUD PRN, #15, no refills sent to [place]. Roxan Hockey, MD [facility] [phone]

## 2022-03-24 ENCOUNTER — Inpatient Hospital Stay: Payer: Medicaid Other

## 2022-03-24 ENCOUNTER — Inpatient Hospital Stay: Payer: Medicaid Other | Attending: Internal Medicine | Admitting: Internal Medicine

## 2022-03-25 ENCOUNTER — Encounter: Payer: Self-pay | Admitting: Radiation Oncology

## 2022-03-25 ENCOUNTER — Other Ambulatory Visit: Payer: Self-pay

## 2022-03-25 ENCOUNTER — Ambulatory Visit
Admission: RE | Admit: 2022-03-25 | Discharge: 2022-03-25 | Disposition: A | Discharge status: Home / Self Care | Payer: Medicaid Other | Source: Ambulatory Visit | Attending: Radiation Oncology | Admitting: Radiation Oncology

## 2022-03-25 ENCOUNTER — Encounter: Payer: Self-pay | Admitting: *Deleted

## 2022-03-25 VITALS — BP 102/75 | HR 92 | Temp 98.1°F | Resp 20 | Ht 67.0 in | Wt 121.4 lb

## 2022-03-25 DIAGNOSIS — R634 Abnormal weight loss: Secondary | ICD-10-CM

## 2022-03-25 DIAGNOSIS — F17210 Nicotine dependence, cigarettes, uncomplicated: Secondary | ICD-10-CM | POA: Insufficient documentation

## 2022-03-25 DIAGNOSIS — Z902 Acquired absence of lung [part of]: Secondary | ICD-10-CM

## 2022-03-25 DIAGNOSIS — R911 Solitary pulmonary nodule: Secondary | ICD-10-CM | POA: Diagnosis not present

## 2022-03-25 DIAGNOSIS — C090 Malignant neoplasm of tonsillar fossa: Secondary | ICD-10-CM | POA: Insufficient documentation

## 2022-03-25 LAB — CMP (CANCER CENTER ONLY)
ALT: 18 U/L (ref 0–44)
AST: 47 U/L — ABNORMAL HIGH (ref 15–41)
Albumin: 4.5 g/dL (ref 3.5–5.0)
Alkaline Phosphatase: 81 U/L (ref 38–126)
Anion gap: 11 (ref 5–15)
BUN: 7 mg/dL (ref 6–20)
CO2: 28 mmol/L (ref 22–32)
Calcium: 9.8 mg/dL (ref 8.9–10.3)
Chloride: 101 mmol/L (ref 98–111)
Creatinine: 0.69 mg/dL (ref 0.61–1.24)
GFR, Estimated: >60 mL/min (ref >60)
Glucose, Bld: 110 mg/dL — ABNORMAL HIGH (ref 70–99)
Potassium: 4.1 mmol/L (ref 3.5–5.1)
Sodium: 140 mmol/L (ref 135–145)
Total Bilirubin: 0.6 mg/dL (ref 0.3–1.2)
Total Protein: 8.1 g/dL (ref 6.5–8.1)

## 2022-03-25 LAB — CBC WITH DIFFERENTIAL (CANCER CENTER ONLY)
Abs Immature Granulocytes: 0 10*3/uL (ref 0.00–0.07)
Basophils Absolute: 0.1 10*3/uL (ref 0.0–0.1)
Basophils Relative: 2 %
Eosinophils Absolute: 0.1 10*3/uL (ref 0.0–0.5)
Eosinophils Relative: 4 %
HCT: 36.9 % — ABNORMAL LOW (ref 39.0–52.0)
Hemoglobin: 12.2 g/dL — ABNORMAL LOW (ref 13.0–17.0)
Immature Granulocytes: 0 %
Lymphocytes Relative: 30 %
Lymphs Abs: 1 10*3/uL (ref 0.7–4.0)
MCH: 28.6 pg (ref 26.0–34.0)
MCHC: 33.1 g/dL (ref 30.0–36.0)
MCV: 86.6 fL (ref 80.0–100.0)
Monocytes Absolute: 0.5 10*3/uL (ref 0.1–1.0)
Monocytes Relative: 16 %
Neutro Abs: 1.6 10*3/uL — ABNORMAL LOW (ref 1.7–7.7)
Neutrophils Relative %: 48 %
Platelet Count: 160 10*3/uL (ref 150–400)
RBC: 4.26 MIL/uL (ref 4.22–5.81)
RDW: 14.5 % (ref 11.5–15.5)
WBC Count: 3.3 10*3/uL — ABNORMAL LOW (ref 4.0–10.5)
nRBC: 0 % (ref 0.0–0.2)

## 2022-03-25 NOTE — Progress Notes (Signed)
Oncology Nurse Navigator Documentation     03/25/2022   12:00 PM 03/17/2022    3:00 PM 03/22/2020    8:21 AM 03/14/2019   10:17 AM 12/30/2018    1:52 PM 12/29/2018   10:47 AM 12/28/2018    3:50 PM  Oncology Nurse Navigator Flowsheets  Navigator Location [facility]  Referral Date to RadOnc/MedOnc  03/17/2022       Navigator Encounter Type Telephone Other: Other: Telephone Telephone Telephone Follow-up Appt  Telephone Outgoing Call  Outgoing Call Outgoing Call;Appt Confirmation/Clarification Outgoing Call;Patient Update;Medication Assistance Outgoing Call;Appt Confirmation/Clarification   Patient Visit Type  Other Follow-up    MedOnc  Treatment Phase  Other Post-Tx Follow-up Post-Tx Follow-up Post-Tx Follow-up    Barriers/Navigation Needs Coordination of Care;Education/Mr. Bohman missed his new patient appt with Dr. Julien Nordmann yesterday.  I called him today to re-schedule. I was unable to reach him but did leave a vm message with my name and phone number to call.  Coordination of Care No Barriers At This Time   Coordination of Care   Education Other      Smoking cessation  Interventions Coordination of Care;Education Coordination of Care    Coordination of Care   Acuity Level 2-Minimal Needs (1-2 Barriers Identified) Level 2-Minimal Needs (1-2 Barriers Identified) Level 1-No Barriers  Level 2 Level 2 Level 2  Coordination of Care Other Other       Time Spent with Patient 15 30 15 15 30 15  45

## 2022-03-25 NOTE — Progress Notes (Signed)
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 Weight loss  R63.4 TSH        Cancer Staging  Cancer of tonsillar fossa ([place]) Staging form: Pharynx - P16 Negative Oropharynx, AJCC 8th Edition - Clinical stage from 08/06/2017: Stage III (cT3, cN0, cM0, p16-) - Signed by Eppie Gibson, MD on 11/08/2017 Histologic grade (G): GX Histologic grading system: 4 grade system Laterality: Left Lymph-vascular invasion (LVI): Presence of LVI unknown/indeterminate Presence of extranodal extension: Unknown ECOG performance status: Grade 1 Karnofsky performance status: Score 80 Perineural invasion (PNI): Unknown Stage used in treatment planning: Yes National guidelines used in treatment planning: Yes Type of national guideline used in treatment planning: NCCN   Left tonsil and bilateral neck directed with 70 Gy in 35 fractions on 09/01/17 to 10/25/17.  CHIEF COMPLAINT: Follow-up for throat cancer  Narrative:      Patient presents today for follow up after completing radiation to the left tonsil and bilateral neck on 10/25/2017  Pain issues, if any: Denies any mouth or throat pain. Does report back pain since his lobe Using a feeding tube?: N/A Weight changes, if any: Reports his appetite has waned notecably Wt Readings from Last 3 Encounters:  03/25/22 121 lb 6.4 oz (55.1 kg)  03/17/22 125 lb 3.2 oz (56.8 kg)  02/05/22 123 lb (55.8 kg)   Swallowing issues, if any: Patient denies (unless drier). States that as long as his meals have a sauce/gravy, or he has something to drink, he doesn't have any issues Smoking or chewing tobacco? Continues to smoke, but is trying to ween off using  nicotine gum Using fluoride trays daily?  N/A--full set of dentures Last ENT visit was on: Not since pandemic Other notable issues, if any: Had right upper lobectomy by Dr. Roxan Hockey done 02/05/2022. Stage I adenocarcinoma, clear margins. States he has been feeling more fatigued and unsteady on his feet since his lobectomy. Reports persistent dry mouth. States he is interested in an appetite stimulate to try and help him regain some weight.  He remains lost to f/u with med/onc and ENT.  Has not been taking his levothyroxine.  Still drinking ETOH against medical advice.    Since last visit w/ me, CT neck was negative for malignancy - submandibular sialoadenitis noted.    ALLERGIES:  has No Known Allergies.  Meds: Current Outpatient Medications  Medication Sig Dispense Refill   acetaminophen (TYLENOL) 500 MG tablet Take 1,000 mg by mouth every 6 (six) hours as needed for mild pain.     ibuprofen (ADVIL) 200 MG tablet Take 400 mg by mouth every 6 (six) hours as needed for mild pain.     oxyCODONE (OXY IR/ROXICODONE) 5 MG immediate release tablet Take 1 tablet (5 mg total) by mouth 3 (three) times daily as needed for severe pain. 15 tablet 0   tetrahydrozoline 0.05 % ophthalmic solution Place 1 drop into both eyes daily as needed (allergy).     No current facility-administered medications for this encounter.   Facility-Administered Medications Ordered in Other Encounters  Medication Dose Route Frequency Provider Last Rate Last Admin   lactated ringers infusion  Intravenous Continuous [name], MD        Physical Findings: The patient is in no acute distress. Patient is alert and oriented. Wt Readings from Last 3 Encounters:  03/25/22 121 lb 6.4 oz (55.1 kg)  03/17/22 125 lb 3.2 oz (56.8 kg)  02/05/22 123 lb (55.8 kg)    height is 5\' 7"  (1.702 m) and weight is 121 lb 6.4 oz (55.1 kg). His temperature is 98.1 F (36.7 C). His blood pressure is 102/75 and his pulse is 92. His  respiration is 20 and oxygen saturation is 98%. .    General: Alert and oriented, in no acute distress; thin HEENT: Head is normocephalic. Extraocular movements are intact.  Oral cavity and oropharynx clear.  Edentulous -dentures removed. Neck: Right submandibular gland is firm and enlarged more than left (see narrative - CT has been done).  Otherwise neck is supple, no palpable cervical or supraclavicular lymphadenopathy. Extremities: No cyanosis or edema. Lymphatics: see Neck Exam Skin: No concerning lesions. Musculoskeletal: Ambulatory   Heart regular in rate and rhythm Chest clear to auscultation bilaterally   Lab Findings: Lab Results  Component Value Date   WBC 3.3 (L) 03/25/2022   HGB 12.2 (L) 03/25/2022   HCT 36.9 (L) 03/25/2022   MCV 86.6 03/25/2022   PLT 160 03/25/2022   Lab Results  Component Value Date   TSH 43.868 (H) 03/25/2021     Radiographic Findings: DG Chest 2 View  Result Date: 03/17/2022 CLINICAL DATA:  Lung nodule EXAM: CHEST - 2 VIEW COMPARISON:  Chest x-ray dated February 09, 2022 FINDINGS: The heart size and mediastinal contours are within normal limits. Volume loss of the right hemithorax with linear right lower lobe opacity which is likely due to atelectasis. No focal consolidation. The visualized skeletal structures are unremarkable. IMPRESSION: Postoperative appearance of the right chest.  No acute abnormality. Electronically Signed   By: Yetta Glassman M.D.   On: 03/17/2022 13:52    Impression/Plan:    Is a very pleasant 61 year old gentleman with a history of head neck cancer. He is cured.  Lung nodule (adeno) cured with recent surgery.  He has been lost to follow-up with medical and otolaryngology. He has not followed through with taking his levothyroxine and he does continue to smoke.   Again I encouraged him to stop smoking but he does not seem motivated to quit. He is also still drinking ETOH  I gave him ideas to boost his PO intake.  We  will resume his levothyroxine after TSH results come back (he wants Rx to [facility], [address]).    We will refer him back to ENT as he was lost to f/u. He will try loratadine for nasal drip. He'd like to see them if this does not resolve.  He is ready to enroll in our H+N survivorship program - will refer to be seen in 48mo. Will need cont'd surveillance and help with nutrition, lifestyle choices, and thyroid supplementation/labs. I will see him PRN.  On date of service, in total, I spent 35 minutes on this encounter.  The patient was seen in person. _____________________________________   Eppie Gibson, MD

## 2022-03-25 NOTE — Progress Notes (Signed)
Patient presents today for follow up after completing radiation to the left tonsil and bilateral neck on 10/25/2017  Pain issues, if any: Denies any mouth or throat pain. Does report back pain since his lobe Using a feeding tube?: N/A Weight changes, if any: Reports his appetite has waned notecably Wt Readings from Last 3 Encounters:  03/25/22 121 lb 6.4 oz (55.1 kg)  03/17/22 125 lb 3.2 oz (56.8 kg)  02/05/22 123 lb (55.8 kg)   Swallowing issues, if any: Patient denies (unless drier). States that as long as his meals have a sauce/gravy, or he has something to drink, he doesn't have any issues Smoking or chewing tobacco? Continues to smoke, but is trying to ween off using nicotine gum Using fluoride trays daily?  N/A--full set of dentures Last ENT visit was on: Not since pandemic Other notable issues, if any: Had right upper lobectomy by Dr. Roxan Hockey done 02/05/2022. States he has been feeling more fatigued and unsteady on his feet since his lobectomy. Reports persistent dry mouth. States he is interested in an appetite stimulate to try and help him regain some weight.

## 2022-03-26 ENCOUNTER — Other Ambulatory Visit: Payer: Self-pay

## 2022-03-26 ENCOUNTER — Encounter: Payer: Self-pay | Admitting: *Deleted

## 2022-03-26 DIAGNOSIS — C090 Malignant neoplasm of tonsillar fossa: Secondary | ICD-10-CM

## 2022-03-26 LAB — TSH: TSH: 43.348 u[IU]/mL — ABNORMAL HIGH (ref 0.350–4.500)

## 2022-03-26 NOTE — Progress Notes (Signed)
Oncology Nurse Navigator Documentation     03/26/2022    1:00 PM 03/26/2022   11:41 AM 03/25/2022   12:00 PM 03/17/2022    3:00 PM 03/22/2020    8:21 AM 03/14/2019   10:17 AM 12/30/2018    1:52 PM  Oncology Nurse Navigator Flowsheets  Navigator Location [facility]  Referral Date to RadOnc/MedOnc    03/17/2022     Navigator Encounter Type Telephone Other: Telephone Other: Other: Telephone Telephone  Telephone Outgoing Call  Outgoing Call  Outgoing Call Outgoing Call;Appt Confirmation/Clarification Outgoing Call;Patient Update;Medication Assistance  Patient Visit Type  Follow-up;RadOnc  Other Follow-up    Treatment Phase  Survivorship  Other Post-Tx Follow-up Post-Tx Follow-up Post-Tx Follow-up  Barriers/Navigation Needs Coordination of Care;Education Coordination of Care Coordination of Care;Education Coordination of Care No Barriers At This Time    Education Other  Other      Interventions Coordination of Care;Education/Mr. Geesey was a no show to his appt with Dr. Julien Nordmann this week. I called him to get him re-scheduled.  I was unable to reach but did leave a vm message with my name and phone number to reach.   Coordination of Care;Education Coordination of Care     Acuity Level 2-Minimal Needs (1-2 Barriers Identified) Level 2-Minimal Needs (1-2 Barriers Identified) Level 2-Minimal Needs (1-2 Barriers Identified) Level 2-Minimal Needs (1-2 Barriers Identified) Level 1-No Barriers  Level 2  Coordination of Care Other Appts Other Other     Education Method Verbal        Time Spent with Patient 15 15 15 30 15 15  30

## 2022-03-26 NOTE — Progress Notes (Signed)
Oncology Nurse Navigator Documentation   Per patient's 03/24/22 post-treatment follow-up with Dr. Isidore Moos, sent fax to [facility] Scheduling with request Mr. Yera be contacted and scheduled for routine post-RT follow-up at the next available MD appointment time.  He has been lost to follow up at [facility] for several years and his previous MD has retired. Notification of successful fax transmission received.   Harlow Asa RN, BSN, OCN Head & Neck Oncology Nurse [place] at [facility] Phone # [phone]  Fax # [phone]

## 2022-03-27 ENCOUNTER — Telehealth: Payer: Self-pay

## 2022-03-27 ENCOUNTER — Other Ambulatory Visit: Payer: Self-pay

## 2022-03-27 ENCOUNTER — Other Ambulatory Visit: Payer: Self-pay | Admitting: Radiation Oncology

## 2022-03-27 DIAGNOSIS — E039 Hypothyroidism, unspecified: Secondary | ICD-10-CM

## 2022-03-27 DIAGNOSIS — C090 Malignant neoplasm of tonsillar fossa: Secondary | ICD-10-CM

## 2022-03-27 MED ORDER — LEVOTHYROXINE SODIUM 50 MCG PO TABS: ORAL_TABLET | ORAL | 4 refills | Status: DC

## 2022-03-27 NOTE — Telephone Encounter (Signed)
Called and spoke with patient to let him know of synthroid prescription sent to [facility] on [address]. Reiterated importance of taking medication in the morning on an empty stomach, at least 1 hour before having anything to eat/drink. Patient verbalized understanding and intent to take medication. Denied any other needs at this time and expressed appreciation of Dr. Pearlie Oyster care. Patient reminded of Survivorship Care Plan appointment in September.

## 2022-03-27 NOTE — Telephone Encounter (Signed)
-----   Message from Eppie Gibson, MD sent at 03/27/2022  7:26 AM EDT ----- Hi all, TSH is high and I resumed his synthroid at 41mcg which he had not been taking.   Ianmichael Amescua, please let him know that I refilled this to his [facility] on [address].  Please reiterate the importance of him taking this every morning on an empty stomach per prescription.  Please order a TSH to be done before his survivorship appointment in 3 months.  I am not optimistic that he will take this as he is chronically failed to do so, but fingers crossed.  Hopefully Mendel Ryder or Regan Rakers will keep him on track.... thanks everybody! ----- Message ----- From: Interface, Lab In [place] Sent: 03/26/2022   8:20 AM EDT To: Eppie Gibson, MD

## 2022-03-31 ENCOUNTER — Telehealth: Payer: Self-pay | Admitting: Internal Medicine

## 2022-03-31 NOTE — Telephone Encounter (Signed)
Pt no showed appt. Called pt, no answer. Left msg for pt to call back to r/s appt.

## 2022-04-01 ENCOUNTER — Telehealth: Payer: Self-pay | Admitting: Internal Medicine

## 2022-04-01 NOTE — Telephone Encounter (Signed)
R/s pt's new pat appt with Dr. Julien Nordmann. Spoke to pt who is aware of new appt date and time.

## 2022-04-15 ENCOUNTER — Telehealth: Payer: Self-pay

## 2022-04-15 ENCOUNTER — Inpatient Hospital Stay: Payer: Medicaid Other | Admitting: Internal Medicine

## 2022-04-15 ENCOUNTER — Telehealth: Payer: Self-pay | Admitting: Internal Medicine

## 2022-04-15 ENCOUNTER — Inpatient Hospital Stay: Payer: Medicaid Other

## 2022-04-15 NOTE — Telephone Encounter (Signed)
R/s pt's new pt appt with Dr. Julien Nordmann. Pt is aware of new appt date and time.

## 2022-04-15 NOTE — Telephone Encounter (Signed)
Pt called to cancel new pt appointment. He did not have transportation. Scheduling message sent to [facility]. to reschedule apt.

## 2022-04-29 ENCOUNTER — Other Ambulatory Visit: Payer: Self-pay | Admitting: Medical Oncology

## 2022-04-29 DIAGNOSIS — Z902 Acquired absence of lung [part of]: Secondary | ICD-10-CM

## 2022-04-30 ENCOUNTER — Inpatient Hospital Stay: Payer: Medicaid Other

## 2022-04-30 ENCOUNTER — Other Ambulatory Visit: Payer: Self-pay

## 2022-04-30 ENCOUNTER — Encounter: Payer: Self-pay | Admitting: Internal Medicine

## 2022-04-30 ENCOUNTER — Inpatient Hospital Stay: Payer: Medicaid Other | Attending: Internal Medicine | Admitting: Internal Medicine

## 2022-04-30 VITALS — BP 116/84 | HR 68 | Temp 98.3°F | Resp 16 | Wt 119.6 lb

## 2022-04-30 DIAGNOSIS — Z923 Personal history of irradiation: Secondary | ICD-10-CM | POA: Diagnosis not present

## 2022-04-30 DIAGNOSIS — E039 Hypothyroidism, unspecified: Secondary | ICD-10-CM

## 2022-04-30 DIAGNOSIS — Z79899 Other long term (current) drug therapy: Secondary | ICD-10-CM | POA: Insufficient documentation

## 2022-04-30 DIAGNOSIS — F17210 Nicotine dependence, cigarettes, uncomplicated: Secondary | ICD-10-CM | POA: Diagnosis not present

## 2022-04-30 DIAGNOSIS — C3490 Malignant neoplasm of unspecified part of unspecified bronchus or lung: Secondary | ICD-10-CM

## 2022-04-30 DIAGNOSIS — Z85818 Personal history of malignant neoplasm of other sites of lip, oral cavity, and pharynx: Secondary | ICD-10-CM | POA: Insufficient documentation

## 2022-04-30 DIAGNOSIS — C3411 Malignant neoplasm of upper lobe, right bronchus or lung: Secondary | ICD-10-CM | POA: Insufficient documentation

## 2022-04-30 DIAGNOSIS — Z902 Acquired absence of lung [part of]: Secondary | ICD-10-CM

## 2022-04-30 DIAGNOSIS — C090 Malignant neoplasm of tonsillar fossa: Secondary | ICD-10-CM

## 2022-04-30 DIAGNOSIS — C3491 Malignant neoplasm of unspecified part of right bronchus or lung: Secondary | ICD-10-CM | POA: Insufficient documentation

## 2022-04-30 LAB — CMP (CANCER CENTER ONLY)
ALT: 10 U/L (ref 0–44)
AST: 24 U/L (ref 15–41)
Albumin: 4.3 g/dL (ref 3.5–5.0)
Alkaline Phosphatase: 77 U/L (ref 38–126)
Anion gap: 8 (ref 5–15)
BUN: 6 mg/dL (ref 6–20)
CO2: 29 mmol/L (ref 22–32)
Calcium: 9.7 mg/dL (ref 8.9–10.3)
Chloride: 99 mmol/L (ref 98–111)
Creatinine: 0.76 mg/dL (ref 0.61–1.24)
GFR, Estimated: >60 mL/min (ref >60)
Glucose, Bld: 104 mg/dL — ABNORMAL HIGH (ref 70–99)
Potassium: 3.9 mmol/L (ref 3.5–5.1)
Sodium: 136 mmol/L (ref 135–145)
Total Bilirubin: 0.5 mg/dL (ref 0.3–1.2)
Total Protein: 7.6 g/dL (ref 6.5–8.1)

## 2022-04-30 LAB — CBC WITH DIFFERENTIAL (CANCER CENTER ONLY)
Abs Immature Granulocytes: 0.01 10*3/uL (ref 0.00–0.07)
Basophils Absolute: 0.1 10*3/uL (ref 0.0–0.1)
Basophils Relative: 1 %
Eosinophils Absolute: 0.1 10*3/uL (ref 0.0–0.5)
Eosinophils Relative: 2 %
HCT: 32.9 % — ABNORMAL LOW (ref 39.0–52.0)
Hemoglobin: 11.1 g/dL — ABNORMAL LOW (ref 13.0–17.0)
Immature Granulocytes: 0 %
Lymphocytes Relative: 20 %
Lymphs Abs: 0.9 10*3/uL (ref 0.7–4.0)
MCH: 28.4 pg (ref 26.0–34.0)
MCHC: 33.7 g/dL (ref 30.0–36.0)
MCV: 84.1 fL (ref 80.0–100.0)
Monocytes Absolute: 0.8 10*3/uL (ref 0.1–1.0)
Monocytes Relative: 19 %
Neutro Abs: 2.5 10*3/uL (ref 1.7–7.7)
Neutrophils Relative %: 58 %
Platelet Count: 136 10*3/uL — ABNORMAL LOW (ref 150–400)
RBC: 3.91 MIL/uL — ABNORMAL LOW (ref 4.22–5.81)
RDW: 15.1 % (ref 11.5–15.5)
WBC Count: 4.3 10*3/uL (ref 4.0–10.5)
nRBC: 0 % (ref 0.0–0.2)

## 2022-04-30 LAB — TSH: TSH: 46.148 u[IU]/mL — ABNORMAL HIGH (ref 0.350–4.500)

## 2022-04-30 NOTE — Progress Notes (Signed)
[place] [phone]   [phone]  CONSULT NOTE  REFERRING PHYSICIAN: Dr. Modesto Charon  REASON FOR CONSULTATION:  61 years old African-American male recently diagnosed with lung cancer.  HPI [name] is a 61 y.o. male with past medical history significant for osteoarthritis, alcohol abuse as well as history of Stage III (T3, N0, M0) left tonsillar carcinoma diagnosed in November 2018 status post a course of definitive chemoradiation with weekly carboplatin and paclitaxel for 6 cycles completed on January 2019 under the care of Dr. Isidore Moos and Dr. Maylon Peppers.  The patient had no evidence for residual disease after his treatment.  The patient was followed by imaging studies by Dr. Isidore Moos for close monitoring of his condition.  CT scan of the chest on 05/02/2021 showed dominant groundglass density in the right upper lobe pulmonary nodule measuring 850 mm slightly increased compared to previous study.  On November 06, 2021 the patient had CT super D of the chest and it showed the subsolid nodule within the right upper lobe measuring 1.5 x 1.0 x 0.9 cm slightly increased in size.  There was groundglass nodule within the anterior left upper lobe measuring 0.5 x 0.5 cm not significantly changed.  The patient was seen by Dr. Valeta Harms and on January 02, 2022 he underwent flexible video fiberoptic bronchoscopy with robotic assistance and biopsies.  The final pathology ([id number]) showed malignant cells consistent with adenocarcinoma.  The patient was referred to Dr. Roxan Hockey and he underwent robotic assisted right upper lobectomy with lymph node dissection on 02/05/2022 and the final pathology ([id number]) showed invasive acinar adenocarcinoma with minor peripheral lipidic component with invasive tumor measuring 1.1 cm in greatest dimension.  The dissected lymph nodes were negative for malignancy and there was no evidence for visceral pleural or lymphovascular  invasion and the resection margin was negative for malignancy. Dr. Roxan Hockey kindly referred the patient to me today for evaluation and recommendation monitoring of his condition. When seen today he continues to have the soreness on the right side of the chest but no significant cough shortness of breath or hemoptysis.  He denied having any weight loss or night sweats.  He has no nausea, vomiting, diarrhea or constipation.  He has no headache or visual changes. Family history significant for mother and father with heart disease. The patient is single and has 3 children.  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  He has a history of smoking 1 pack/day for around 45 years and unfortunately continues to smoke few cigarettes every day.  He also drinks a couple of beers every day.  He denied having any history of drug abuse.  HPI  Past Medical History:  Diagnosis Date   Alcoholism ([place])    Arthritis    Cancer ([place])    left tonsil cancer   History of chemotherapy    History of radiation therapy 09/01/17- 10/25/17   Left tonsil and bilateral neck 70 Gy in 35 fractions to gross disease, 63 gy in 35 fractions to high risk nodal echelons, and 56 Gy in 35 fraction to intermediate risk nodal echelons.     Past Surgical History:  Procedure Laterality Date   BRONCHIAL BIOPSY  01/02/2022   Procedure: BRONCHIAL BIOPSIES;  Surgeon: Garner Nash, DO;  Location: [facility];  Service: Pulmonary;;   BRONCHIAL BRUSHINGS  01/02/2022   Procedure: BRONCHIAL BRUSHINGS;  Surgeon: Garner Nash, DO;  Location: [facility];  Service: Pulmonary;;   COLONOSCOPY  FIDUCIAL MARKER PLACEMENT  01/02/2022   Procedure: FIDUCIAL MARKER PLACEMENT;  Surgeon: Garner Nash, DO;  Location: [facility];  Service: Pulmonary;;   INTERCOSTAL NERVE BLOCK Right 02/05/2022   Procedure: INTERCOSTAL NERVE BLOCK;  Surgeon: [name], MD;  Location: [facility] OR;  Service: Thoracic;  Laterality: Right;   IR  FLUORO GUIDE PORT INSERTION RIGHT  09/13/2017   IR GASTROSTOMY TUBE MOD SED  09/13/2017   IR GASTROSTOMY TUBE REMOVAL  03/28/2018   IR REMOVAL TUN ACCESS W/ PORT W/O FL MOD SED  03/28/2018   IR US GUIDE VASC ACCESS RIGHT  09/13/2017   MULTIPLE EXTRACTIONS WITH ALVEOLOPLASTY N/A 08/09/2017   Procedure: Extraction of tooth #'s 1-6, 11-17,21,22,26-30 and 32 with alveoloplasty and bilateral mandibular tori reductions;  Surgeon: Lenn Cal, DDS;  Location: [facility];  Service: Oral Surgery;  Laterality: N/A;   NODE DISSECTION Right 02/05/2022   Procedure: NODE DISSECTION;  Surgeon: [name], MD;  Location: [facility] OR;  Service: Thoracic;  Laterality: Right;   tonsil biopsy     left tonsil    Family History  Problem Relation Age of Onset   Other Mother        pre-diabetes   Colon cancer Neg Hx    Esophageal cancer Neg Hx    Liver cancer Neg Hx    Pancreatic cancer Neg Hx    Rectal cancer Neg Hx    Stomach cancer Neg Hx     Social History Social History   Tobacco Use   Smoking status: Every Day    Packs/day: 0.25    Years: 30.00    Total pack years: 7.50    Types: Cigarettes   Smokeless tobacco: Never   Tobacco comments:    Tobacco info given  Vaping Use   Vaping Use: Never used  Substance Use Topics   Alcohol use: Yes    Alcohol/week: 7.0 standard drinks of alcohol    Types: 7 Cans of beer per week    Comment: 1 pint daily or more   Drug use: Yes    Types: Cocaine, Marijuana    Comment: he has a past history of cocaine abuse, he denies current use    No Known Allergies  Current Outpatient Medications  Medication Sig Dispense Refill   acetaminophen (TYLENOL) 500 MG tablet Take 1,000 mg by mouth every 6 (six) hours as needed for mild pain.     ibuprofen (ADVIL) 200 MG tablet Take 400 mg by mouth every 6 (six) hours as needed for mild pain.     levothyroxine (SYNTHROID) 50 MCG tablet Take 1 tablet every morning on empty stomach with water, at least 1 hour  before other medications and before eating. 90 tablet 4   oxyCODONE (OXY IR/ROXICODONE) 5 MG immediate release tablet Take 1 tablet (5 mg total) by mouth 3 (three) times daily as needed for severe pain. 15 tablet 0   tetrahydrozoline 0.05 % ophthalmic solution Place 1 drop into both eyes daily as needed (allergy).     No current facility-administered medications for this visit.   Facility-Administered Medications Ordered in Other Visits  Medication Dose Route Frequency Provider Last Rate Last Admin   lactated ringers infusion   Intravenous Continuous [name], MD        Review of Systems  Constitutional: negative Eyes: negative Ears, nose, mouth, throat, and face: negative Respiratory: positive for pleurisy/chest pain Cardiovascular: negative Gastrointestinal: negative Genitourinary:negative Integument/breast: negative Hematologic/lymphatic: negative Musculoskeletal:negative Neurological: negative Behavioral/Psych: negative Endocrine: negative  Allergic/Immunologic: negative  Physical Exam  GEX:BMWUX, healthy, no distress, well nourished, and well developed SKIN: skin color, texture, turgor are normal, no rashes or significant lesions HEAD: Normocephalic, No masses, lesions, tenderness or abnormalities EYES: normal, PERRLA, Conjunctiva are pink and non-injected EARS: External ears normal, Canals clear OROPHARYNX:no exudate, no erythema, and lips, buccal mucosa, and tongue normal  NECK: supple, no adenopathy, no JVD LYMPH:  no palpable lymphadenopathy, no hepatosplenomegaly LUNGS: clear to auscultation , and palpation HEART: regular rate & rhythm, no murmurs, and no gallops ABDOMEN:abdomen soft, non-tender, normal bowel sounds, and no masses or organomegaly BACK: Back symmetric, no curvature., No CVA tenderness EXTREMITIES:no joint deformities, effusion, or inflammation, no edema  NEURO: alert & oriented x 3 with fluent speech, no focal motor/sensory  deficits  PERFORMANCE STATUS: ECOG 1  LABORATORY DATA: Lab Results  Component Value Date   WBC 4.3 04/30/2022   HGB 11.1 (L) 04/30/2022   HCT 32.9 (L) 04/30/2022   MCV 84.1 04/30/2022   PLT 136 (L) 04/30/2022      Chemistry      Component Value Date/Time   NA 136 04/30/2022 1331   NA 136 10/13/2017 1320   K 3.9 04/30/2022 1331   K 4.5 10/13/2017 1320   CL 99 04/30/2022 1331   CO2 29 04/30/2022 1331   CO2 31 (H) 10/13/2017 1320   BUN 6 04/30/2022 1331   BUN 12.1 10/13/2017 1320   CREATININE 0.76 04/30/2022 1331   CREATININE 0.7 10/13/2017 1320      Component Value Date/Time   CALCIUM 9.7 04/30/2022 1331   CALCIUM 9.8 10/13/2017 1320   ALKPHOS 77 04/30/2022 1331   ALKPHOS 79 10/13/2017 1320   AST 24 04/30/2022 1331   AST 29 10/13/2017 1320   ALT 10 04/30/2022 1331   ALT 26 10/13/2017 1320   BILITOT 0.5 04/30/2022 1331   BILITOT <0.22 10/13/2017 1320       RADIOGRAPHIC STUDIES: No results found.  ASSESSMENT: This is a very pleasant 61 years old African-American male diagnosed with stage Ia (T1b, N0, M0) non-small cell lung cancer, adenocarcinoma presented with right upper lobe lung nodule status post right upper lobectomy with lymph node dissection under the care of Dr. Roxan Hockey on 02/05/2022. The patient also has a history of stage III (T3, N0, M0) left tonsillar carcinoma status post concurrent chemoradiation completed in January 2019 under the care of Dr. Isidore Moos.   PLAN: I had a lengthy discussion with the patient today about his current condition, prognosis and treatment options. I personally and independently reviewed his previous imaging studies as well as the pathology reports. I explained to the patient that he had a curable treatment for his condition with the surgical resection. I informed the patient that there is no survival benefit for adjuvant systemic chemotherapy or radiation for patient with a stage Ia non-small cell lung cancer and the current  standard of care is observation. I recommended for the patient to continue on observation for now with repeat CT scan of the chest in 6 months. For the smoking cessation I strongly encouraged the patient to quit smoking. He was advised to call immediately if he has any other concerning symptoms in the interval. The patient voices understanding of current disease status and treatment options and is in agreement with the current care plan. All questions were answered. The patient knows to call the clinic with any problems, questions or concerns. We can certainly see the patient much sooner if necessary.  Thank you so  much for allowing me to participate in the care of [name]. I will continue to follow up the patient with you and assist in his care. The total time spent in the appointment was 60 minutes.  Disclaimer: This note was dictated with voice recognition software. Similar sounding words can inadvertently be transcribed and may not be corrected upon review.   Eilleen Kempf April 30, 2022, 2:18 PM

## 2022-04-30 NOTE — Patient Instructions (Signed)
Steps to Quit Smoking Smoking tobacco is the leading cause of preventable death. It can affect almost every organ in the body. Smoking puts you and people around you at risk for many serious, long-lasting (chronic) diseases. Quitting smoking can be hard, but it is one of the best things that you can do for your health. It is never too late to quit. Do not give up if you cannot quit the first time. Some people need to try many times to quit. Do your best to stick to your quit plan, and talk with your doctor if you have any questions or concerns. How do I get ready to quit? Pick a date to quit. Set a date within the next 2 weeks to give you time to prepare. Write down the reasons why you are quitting. Keep this list in places where you will see it often. Tell your family, friends, and co-workers that you are quitting. Their support is important. Talk with your doctor about the choices that may help you quit. Find out if your health insurance will pay for these treatments. Know the people, places, things, and activities that make you want to smoke (triggers). Avoid them. What first steps can I take to quit smoking? Throw away all cigarettes at home, at work, and in your car. Throw away the things that you use when you smoke, such as ashtrays and lighters. Clean your car. Empty the ashtray. Clean your home, including curtains and carpets. What can I do to help me quit smoking? Talk with your doctor about taking medicines and seeing a counselor. You are more likely to succeed when you do both. If you are pregnant or breastfeeding: Talk with your doctor about counseling or other ways to quit smoking. Do not take medicine to help you quit smoking unless your doctor tells you to. Quit right away Quit smoking completely, instead of slowly cutting back on how much you smoke over a period of time. Stopping smoking right away may be more successful than slowly quitting. Go to counseling. In-person is best  if this is an option. You are more likely to quit if you go to counseling sessions regularly. Take medicine You may take medicines to help you quit. Some medicines need a prescription, and some you can buy over-the-counter. Some medicines may contain a drug called nicotine to replace the nicotine in cigarettes. Medicines may: Help you stop having the desire to smoke (cravings). Help to stop the problems that come when you stop smoking (withdrawal symptoms). Your doctor may ask you to use: Nicotine patches, gum, or lozenges. Nicotine inhalers or sprays. Non-nicotine medicine that you take by mouth. Find resources Find resources and other ways to help you quit smoking and remain smoke-free after you quit. They include: Online chats with a counselor. Phone quitlines. Printed self-help materials. Support groups or group counseling. Text messaging programs. Mobile phone apps. Use apps on your mobile phone or tablet that can help you stick to your quit plan. Examples of free services include Quit Guide from the CDC and smokefree.gov  What can I do to make it easier to quit?  Talk to your family and friends. Ask them to support and encourage you. Call a phone quitline, such as 1-800-QUIT-NOW, reach out to support groups, or work with a counselor. Ask people who smoke to not smoke around you. Avoid places that make you want to smoke, such as: Bars. Parties. Smoke-break areas at work. Spend time with people who do not smoke. Lower   the stress in your life. Stress can make you want to smoke. Try these things to lower stress: Getting regular exercise. Doing deep-breathing exercises. Doing yoga. Meditating. What benefits will I see if I quit smoking? Over time, you may have: A better sense of smell and taste. Less coughing and sore throat. A slower heart rate. Lower blood pressure. Clearer skin. Better breathing. Fewer sick days. Summary Quitting smoking can be hard, but it is one of  the best things that you can do for your health. Do not give up if you cannot quit the first time. Some people need to try many times to quit. When you decide to quit smoking, make a plan to help you succeed. Quit smoking right away, not slowly over a period of time. When you start quitting, get help and support to keep you smoke-free. This information is not intended to replace advice given to you by your health care provider. Make sure you discuss any questions you have with your health care provider. Document Revised: 09/26/2021 Document Reviewed: 09/26/2021 Elsevier Patient Education  2023 [employer]  

## 2022-05-08 ENCOUNTER — Telehealth: Payer: Self-pay | Admitting: Internal Medicine

## 2022-05-08 NOTE — Telephone Encounter (Signed)
Called patient regarding upcoming appointments, calender will be mailed.

## 2022-05-11 ENCOUNTER — Other Ambulatory Visit: Payer: Self-pay

## 2022-06-08 ENCOUNTER — Other Ambulatory Visit: Payer: Self-pay | Admitting: Thoracic Surgery (Cardiothoracic Vascular Surgery)

## 2022-06-08 DIAGNOSIS — C3491 Malignant neoplasm of unspecified part of right bronchus or lung: Secondary | ICD-10-CM

## 2022-06-09 ENCOUNTER — Ambulatory Visit: Payer: Medicaid Other | Admitting: Thoracic Surgery (Cardiothoracic Vascular Surgery)

## 2022-06-09 ENCOUNTER — Encounter: Payer: Self-pay | Admitting: Thoracic Surgery (Cardiothoracic Vascular Surgery)

## 2022-06-25 ENCOUNTER — Ambulatory Visit: Payer: Medicaid Other | Attending: Radiation Oncology

## 2022-06-25 ENCOUNTER — Inpatient Hospital Stay: Payer: Medicaid Other | Attending: Internal Medicine | Admitting: Nurse Practitioner

## 2022-06-25 DIAGNOSIS — F17210 Nicotine dependence, cigarettes, uncomplicated: Secondary | ICD-10-CM | POA: Insufficient documentation

## 2022-06-25 DIAGNOSIS — C090 Malignant neoplasm of tonsillar fossa: Secondary | ICD-10-CM | POA: Insufficient documentation

## 2022-06-25 DIAGNOSIS — R634 Abnormal weight loss: Secondary | ICD-10-CM | POA: Insufficient documentation

## 2022-06-25 DIAGNOSIS — R911 Solitary pulmonary nodule: Secondary | ICD-10-CM | POA: Insufficient documentation

## 2022-10-29 ENCOUNTER — Inpatient Hospital Stay: Payer: Medicaid Other | Attending: Internal Medicine

## 2022-10-29 ENCOUNTER — Ambulatory Visit (HOSPITAL_COMMUNITY): Payer: Medicaid Other

## 2022-10-30 ENCOUNTER — Inpatient Hospital Stay (HOSPITAL_COMMUNITY): Payer: Medicaid Other

## 2022-10-30 ENCOUNTER — Encounter (HOSPITAL_COMMUNITY): Payer: Self-pay

## 2022-10-30 ENCOUNTER — Emergency Department (HOSPITAL_COMMUNITY): Payer: Medicaid Other

## 2022-10-30 ENCOUNTER — Other Ambulatory Visit: Payer: Self-pay

## 2022-10-30 ENCOUNTER — Inpatient Hospital Stay (HOSPITAL_COMMUNITY)
Admission: EM | Admit: 2022-10-30 | Discharge: 2022-11-02 | DRG: 641 | Disposition: A | Discharge status: Home / Self Care | Payer: Medicaid Other | Attending: Internal Medicine | Admitting: Internal Medicine

## 2022-10-30 DIAGNOSIS — E512 Wernicke's encephalopathy: Principal | ICD-10-CM | POA: Diagnosis present

## 2022-10-30 DIAGNOSIS — M159 Polyosteoarthritis, unspecified: Secondary | ICD-10-CM | POA: Diagnosis present

## 2022-10-30 DIAGNOSIS — E440 Moderate protein-calorie malnutrition: Secondary | ICD-10-CM | POA: Diagnosis present

## 2022-10-30 DIAGNOSIS — G9340 Encephalopathy, unspecified: Secondary | ICD-10-CM | POA: Diagnosis not present

## 2022-10-30 DIAGNOSIS — F17210 Nicotine dependence, cigarettes, uncomplicated: Secondary | ICD-10-CM | POA: Diagnosis present

## 2022-10-30 DIAGNOSIS — Z923 Personal history of irradiation: Secondary | ICD-10-CM

## 2022-10-30 DIAGNOSIS — Z85818 Personal history of malignant neoplasm of other sites of lip, oral cavity, and pharynx: Secondary | ICD-10-CM | POA: Diagnosis not present

## 2022-10-30 DIAGNOSIS — Z79890 Hormone replacement therapy (postmenopausal): Secondary | ICD-10-CM | POA: Diagnosis not present

## 2022-10-30 DIAGNOSIS — E876 Hypokalemia: Secondary | ICD-10-CM | POA: Diagnosis present

## 2022-10-30 DIAGNOSIS — F1490 Cocaine use, unspecified, uncomplicated: Secondary | ICD-10-CM | POA: Diagnosis present

## 2022-10-30 DIAGNOSIS — R55 Syncope and collapse: Secondary | ICD-10-CM | POA: Diagnosis present

## 2022-10-30 DIAGNOSIS — Z9221 Personal history of antineoplastic chemotherapy: Secondary | ICD-10-CM

## 2022-10-30 DIAGNOSIS — E860 Dehydration: Secondary | ICD-10-CM | POA: Diagnosis present

## 2022-10-30 DIAGNOSIS — Z681 Body mass index (BMI) 19 or less, adult: Secondary | ICD-10-CM

## 2022-10-30 DIAGNOSIS — R4182 Altered mental status, unspecified: Secondary | ICD-10-CM | POA: Diagnosis not present

## 2022-10-30 DIAGNOSIS — R739 Hyperglycemia, unspecified: Secondary | ICD-10-CM | POA: Diagnosis present

## 2022-10-30 DIAGNOSIS — E039 Hypothyroidism, unspecified: Secondary | ICD-10-CM | POA: Diagnosis present

## 2022-10-30 DIAGNOSIS — F1290 Cannabis use, unspecified, uncomplicated: Secondary | ICD-10-CM | POA: Diagnosis present

## 2022-10-30 DIAGNOSIS — F1010 Alcohol abuse, uncomplicated: Secondary | ICD-10-CM | POA: Diagnosis present

## 2022-10-30 DIAGNOSIS — R270 Ataxia, unspecified: Secondary | ICD-10-CM

## 2022-10-30 LAB — DIFFERENTIAL
Abs Immature Granulocytes: 0.09 10*3/uL — ABNORMAL HIGH (ref 0.00–0.07)
Basophils Absolute: 0.1 10*3/uL (ref 0.0–0.1)
Basophils Relative: 1 %
Eosinophils Absolute: 0 10*3/uL (ref 0.0–0.5)
Eosinophils Relative: 0 %
Immature Granulocytes: 1 %
Lymphocytes Relative: 13 %
Lymphs Abs: 1 10*3/uL (ref 0.7–4.0)
Monocytes Absolute: 0.9 10*3/uL (ref 0.1–1.0)
Monocytes Relative: 12 %
Neutro Abs: 5.6 10*3/uL (ref 1.7–7.7)
Neutrophils Relative %: 73 %

## 2022-10-30 LAB — CBC
HCT: 39.1 % (ref 39.0–52.0)
Hemoglobin: 12.7 g/dL — ABNORMAL LOW (ref 13.0–17.0)
MCH: 28.2 pg (ref 26.0–34.0)
MCHC: 32.5 g/dL (ref 30.0–36.0)
MCV: 86.9 fL (ref 80.0–100.0)
Platelets: 118 10*3/uL — ABNORMAL LOW (ref 150–400)
RBC: 4.5 MIL/uL (ref 4.22–5.81)
RDW: 15.1 % (ref 11.5–15.5)
WBC: 7.7 10*3/uL (ref 4.0–10.5)
nRBC: 0 % (ref 0.0–0.2)

## 2022-10-30 LAB — URINALYSIS, ROUTINE W REFLEX MICROSCOPIC
Bacteria, UA: NONE SEEN
Bilirubin Urine: NEGATIVE
Glucose, UA: 50 mg/dL — AB
Hgb urine dipstick: NEGATIVE
Ketones, ur: NEGATIVE mg/dL
Leukocytes,Ua: NEGATIVE
Nitrite: NEGATIVE
Protein, ur: 100 mg/dL — AB
Specific Gravity, Urine: >1.046 — ABNORMAL HIGH (ref 1.005–1.030)
pH: 7 (ref 5.0–8.0)

## 2022-10-30 LAB — COMPREHENSIVE METABOLIC PANEL
ALT: 20 U/L (ref 0–44)
AST: 51 U/L — ABNORMAL HIGH (ref 15–41)
Albumin: 4.3 g/dL (ref 3.5–5.0)
Alkaline Phosphatase: 60 U/L (ref 38–126)
Anion gap: 18 — ABNORMAL HIGH (ref 5–15)
BUN: 7 mg/dL (ref 6–20)
CO2: 22 mmol/L (ref 22–32)
Calcium: 9.4 mg/dL (ref 8.9–10.3)
Chloride: 89 mmol/L — ABNORMAL LOW (ref 98–111)
Creatinine, Ser: 1.06 mg/dL (ref 0.61–1.24)
GFR, Estimated: >60 mL/min (ref >60)
Glucose, Bld: 254 mg/dL — ABNORMAL HIGH (ref 70–99)
Potassium: 3.2 mmol/L — ABNORMAL LOW (ref 3.5–5.1)
Sodium: 129 mmol/L — ABNORMAL LOW (ref 135–145)
Total Bilirubin: 1 mg/dL (ref 0.3–1.2)
Total Protein: 7.7 g/dL (ref 6.5–8.1)

## 2022-10-30 LAB — I-STAT CHEM 8, ED
BUN: 8 mg/dL (ref 6–20)
Calcium, Ion: 1.12 mmol/L — ABNORMAL LOW (ref 1.15–1.40)
Chloride: 90 mmol/L — ABNORMAL LOW (ref 98–111)
Creatinine, Ser: 0.8 mg/dL (ref 0.61–1.24)
Glucose, Bld: 247 mg/dL — ABNORMAL HIGH (ref 70–99)
HCT: 44 % (ref 39.0–52.0)
Hemoglobin: 15 g/dL (ref 13.0–17.0)
Potassium: 3.2 mmol/L — ABNORMAL LOW (ref 3.5–5.1)
Sodium: 131 mmol/L — ABNORMAL LOW (ref 135–145)
TCO2: 23 mmol/L (ref 22–32)

## 2022-10-30 LAB — APTT: aPTT: 27 seconds (ref 24–36)

## 2022-10-30 LAB — RAPID URINE DRUG SCREEN, HOSP PERFORMED
Amphetamines: NOT DETECTED
Barbiturates: NOT DETECTED
Benzodiazepines: NOT DETECTED
Cocaine: NOT DETECTED
Opiates: NOT DETECTED
Tetrahydrocannabinol: NOT DETECTED

## 2022-10-30 LAB — CBG MONITORING, ED
Glucose-Capillary: 251 mg/dL — ABNORMAL HIGH (ref 70–99)
Glucose-Capillary: 112 mg/dL — ABNORMAL HIGH (ref 70–99)
Glucose-Capillary: 137 mg/dL — ABNORMAL HIGH (ref 70–99)

## 2022-10-30 LAB — PROTIME-INR
INR: 1.1 (ref 0.8–1.2)
Prothrombin Time: 13.8 seconds (ref 11.4–15.2)

## 2022-10-30 LAB — HEMOGLOBIN A1C
Hgb A1c MFr Bld: 6.1 % — ABNORMAL HIGH (ref 4.8–5.6)
Mean Plasma Glucose: 128.37 mg/dL

## 2022-10-30 LAB — HIV ANTIBODY (ROUTINE TESTING W REFLEX): HIV Screen 4th Generation wRfx: NONREACTIVE

## 2022-10-30 LAB — ETHANOL: Alcohol, Ethyl (B): <10 mg/dL (ref <10)

## 2022-10-30 MED ORDER — ENOXAPARIN SODIUM 40 MG/0.4ML IJ SOSY
40.0000 mg | PREFILLED_SYRINGE | INTRAMUSCULAR | Status: DC
  Administered 2022-10-30 – 2022-11-01 (×3): 40 mg via SUBCUTANEOUS
  Filled 2022-10-30 (×3): qty 0.4

## 2022-10-30 MED ORDER — LORAZEPAM 1 MG PO TABS: 1.0000 mg | ORAL_TABLET | ORAL | Status: DC | PRN

## 2022-10-30 MED ORDER — FOLIC ACID 1 MG PO TABS
1.0000 mg | ORAL_TABLET | Freq: Every day | ORAL | Status: DC
  Administered 2022-10-30 – 2022-11-02 (×4): 1 mg via ORAL
  Filled 2022-10-30 (×4): qty 1

## 2022-10-30 MED ORDER — NAPHAZOLINE-GLYCERIN 0.012-0.25 % OP SOLN
1.0000 [drp] | Freq: Four times a day (QID) | OPHTHALMIC | Status: DC | PRN
  Filled 2022-10-30: qty 15

## 2022-10-30 MED ORDER — THIAMINE HCL 100 MG/ML IJ SOLN
500.0000 mg | Freq: Three times a day (TID) | INTRAVENOUS | Status: AC
  Administered 2022-10-30 – 2022-10-31 (×4): 500 mg via INTRAVENOUS
  Filled 2022-10-30 (×5): qty 5

## 2022-10-30 MED ORDER — OXYCODONE HCL 5 MG PO TABS
5.0000 mg | ORAL_TABLET | Freq: Three times a day (TID) | ORAL | Status: DC | PRN
  Administered 2022-10-30: 5 mg via ORAL
  Filled 2022-10-30: qty 1

## 2022-10-30 MED ORDER — LEVOTHYROXINE SODIUM 50 MCG PO TABS
50.0000 ug | ORAL_TABLET | Freq: Every day | ORAL | Status: DC
  Administered 2022-10-31 – 2022-11-02 (×3): 50 ug via ORAL
  Filled 2022-10-30 (×3): qty 1

## 2022-10-30 MED ORDER — LACTATED RINGERS IV BOLUS
1000.0000 mL | Freq: Once | INTRAVENOUS | Status: AC
  Administered 2022-10-30: 1000 mL via INTRAVENOUS

## 2022-10-30 MED ORDER — ENSURE ENLIVE PO LIQD
237.0000 mL | Freq: Two times a day (BID) | ORAL | Status: DC
  Administered 2022-11-01: 237 mL via ORAL
  Filled 2022-10-30: qty 237

## 2022-10-30 MED ORDER — IOHEXOL 350 MG/ML SOLN
75.0000 mL | Freq: Once | INTRAVENOUS | Status: AC | PRN
  Administered 2022-10-30: 75 mL via INTRAVENOUS

## 2022-10-30 MED ORDER — ACETAMINOPHEN 500 MG PO TABS
1000.0000 mg | ORAL_TABLET | Freq: Four times a day (QID) | ORAL | Status: DC | PRN
  Administered 2022-10-30 – 2022-11-01 (×3): 1000 mg via ORAL
  Filled 2022-10-30 (×3): qty 2

## 2022-10-30 MED ORDER — THIAMINE MONONITRATE 100 MG PO TABS: 100.0000 mg | ORAL_TABLET | Freq: Every day | ORAL | Status: DC

## 2022-10-30 MED ORDER — POTASSIUM CHLORIDE CRYS ER 20 MEQ PO TBCR
40.0000 meq | EXTENDED_RELEASE_TABLET | Freq: Once | ORAL | Status: AC
  Administered 2022-10-30: 40 meq via ORAL
  Filled 2022-10-30: qty 2

## 2022-10-30 MED ORDER — THIAMINE HCL 100 MG/ML IJ SOLN: 100.0000 mg | Freq: Every day | INTRAMUSCULAR | Status: DC

## 2022-10-30 MED ORDER — ADULT MULTIVITAMIN W/MINERALS CH
1.0000 | ORAL_TABLET | Freq: Every day | ORAL | Status: DC
  Administered 2022-10-30 – 2022-11-02 (×4): 1 via ORAL
  Filled 2022-10-30 (×4): qty 1

## 2022-10-30 MED ORDER — LORAZEPAM 1 MG PO TABS
2.0000 mg | ORAL_TABLET | ORAL | Status: DC | PRN
  Administered 2022-10-30 – 2022-11-01 (×4): 2 mg via ORAL
  Filled 2022-10-30 (×4): qty 2

## 2022-10-30 MED ORDER — INSULIN ASPART 100 UNIT/ML IJ SOLN
0.0000 [IU] | Freq: Three times a day (TID) | INTRAMUSCULAR | Status: DC
  Administered 2022-10-31 (×2): 3 [IU] via SUBCUTANEOUS
  Administered 2022-11-01: 2 [IU] via SUBCUTANEOUS
  Administered 2022-11-02: 1 [IU] via SUBCUTANEOUS

## 2022-10-30 NOTE — ED Provider Notes (Signed)
[facility] EMERGENCY DEPARTMENT Provider Note   CSN: [id number] Arrival date & time: 10/30/22  1308  An emergency department physician performed an initial assessment on this suspected stroke patient at 1310.  History  Chief Complaint  Patient presents with   Code Stroke    [name] is a 62 y.o. male.  HPI 62 year old male presents as a code stroke.  Patient was reportedly cooking and was on the phone with a friend.  The friend came over and found the patient lying on the floor, presumably had passed out.  When EMS was transporting him he was having word salad.  However now his symptoms seem resolved.  When I talked to the patient after his CT and workup, he does not remember anything about today at all.  He does remember cooking, passing out, and is very vague on history.  He cannot tell me what year or day it is though he does tell me is January and that he is in the hospital.  Home Medications Prior to Admission medications   Medication Sig Start Date End Date Taking? Authorizing Provider  acetaminophen (TYLENOL) 500 MG tablet Take 1,000 mg by mouth every 6 (six) hours as needed for mild pain.    [provider]  ibuprofen (ADVIL) 200 MG tablet Take 400 mg by mouth every 6 (six) hours as needed for mild pain.    [provider]  levothyroxine (SYNTHROID) 50 MCG tablet Take 1 tablet every morning on empty stomach with water, at least 1 hour before other medications and before eating. 03/27/22   Eppie Gibson, MD  oxyCODONE (OXY IR/ROXICODONE) 5 MG immediate release tablet Take 1 tablet (5 mg total) by mouth 3 (three) times daily as needed for severe pain. 03/19/22   [name], MD  tetrahydrozoline 0.05 % ophthalmic solution Place 1 drop into both eyes daily as needed (allergy).    [provider]      Allergies    Patient has no known allergies.    Review of Systems   Review of Systems  Physical Exam Updated Vital  Signs BP 101/76   Pulse 69   Temp 98.4 F (36.9 C) (Oral)   Resp (!) 26   Ht 5\' 7"  (1.702 m)   Wt 53 kg   SpO2 99%   BMI 18.30 kg/m  Physical Exam  ED Results / Procedures / Treatments   Labs (all labs ordered are listed, but only abnormal results are displayed) Labs Reviewed  CBC - Abnormal; Notable for the following components:      Result Value   Hemoglobin 12.7 (*)    Platelets 118 (*)    All other components within normal limits  DIFFERENTIAL - Abnormal; Notable for the following components:   Abs Immature Granulocytes 0.09 (*)    All other components within normal limits  COMPREHENSIVE METABOLIC PANEL - Abnormal; Notable for the following components:   Sodium 129 (*)    Potassium 3.2 (*)    Chloride 89 (*)    Glucose, Bld 254 (*)    AST 51 (*)    Anion gap 18 (*)    All other components within normal limits  I-STAT CHEM 8, ED - Abnormal; Notable for the following components:   Sodium 131 (*)    Potassium 3.2 (*)    Chloride 90 (*)    Glucose, Bld 247 (*)    Calcium, Ion 1.12 (*)    All other components within normal limits  CBG MONITORING, ED - Abnormal; Notable for the following components:   Glucose-Capillary 251 (*)    All other components within normal limits  ETHANOL  PROTIME-INR  APTT  RAPID URINE DRUG SCREEN, HOSP PERFORMED  URINALYSIS, ROUTINE W REFLEX MICROSCOPIC    EKG EKG Interpretation  Date/Time:  Friday October 30 2022 13:34:19 EST Ventricular Rate:  82 PR Interval:  222 QRS Duration: 121 QT Interval:  406 QTC Calculation: 475 R Axis:   -81 Text Interpretation: Sinus rhythm Prolonged PR interval RAE, consider biatrial enlargement Nonspecific IVCD with LAD Confirmed by Pricilla Loveless [phone]) on 10/30/2022 3:00:32 PM  Radiology CT C-SPINE NO CHARGE  Result Date: 10/30/2022 CLINICAL DATA:  Code stroke EXAM: CT Cervical Spine with contrast TECHNIQUE: Multiplanar CT images of the cervical spine were reconstructed from contemporary CT of  the Neck. RADIATION DOSE REDUCTION: This exam was performed according to the departmental dose-optimization program which includes automated exposure control, adjustment of the mA and/or kV according to patient size and/or use of iterative reconstruction technique. CONTRAST:  75 cc Omnipaque 350 COMPARISON:  CT neck 05/02/2021 FINDINGS: Alignment: There is reversal of the normal cervical lordosis centered at C5 with trace anterolisthesis of C3 on C4. There is no jumped or perched facet or other evidence of traumatic malalignment. Skull base and vertebrae: Skull base alignment is maintained. Vertebral body heights are preserved. There is no evidence of acute fracture. There is no suspicious osseous lesion. Soft tissues and spinal canal: Choose Disc levels: There is disc space narrowing degenerative endplate change most advanced at C4-C5 and C5-C6. There is overall mild multilevel facet arthropathy. There is no evidence of high-grade spinal canal stenosis. There is right worse than left neural foraminal stenosis C4-C5 and C5-C6. Upper chest: Postsurgical changes are again noted in the right upper lobe. Other: None. IMPRESSION: No acute fracture or traumatic malalignment of the cervical spine. Degenerative changes as above. Electronically Signed   By: Lesia Hausen M.D.   On: 10/30/2022 14:01   CT HEAD CODE STROKE WO CONTRAST  Result Date: 10/30/2022 CLINICAL DATA:  Code stroke. EXAM: CT ANGIOGRAPHY HEAD AND NECK TECHNIQUE: Multidetector CT imaging of the head and neck was performed using the standard protocol during bolus administration of intravenous contrast. Multiplanar CT image reconstructions and MIPs were obtained to evaluate the vascular anatomy. Carotid stenosis measurements (when applicable) are obtained utilizing NASCET criteria, using the distal internal carotid diameter as the denominator. RADIATION DOSE REDUCTION: This exam was performed according to the departmental dose-optimization program which  includes automated exposure control, adjustment of the mA and/or kV according to patient size and/or use of iterative reconstruction technique. CONTRAST:  69mL OMNIPAQUE IOHEXOL 350 MG/ML SOLN COMPARISON:  CT head 08/07/2006 FINDINGS: CT HEAD FINDINGS Brain: There is no acute intracranial hemorrhage, extra-axial fluid collection, or acute territorial infarct. Parenchymal volume is normal for age. The ventricles are normal in size. Gray-white differentiation is preserved. The pituitary and suprasellar region are normal. There is no mass lesion. There is no mass effect or midline shift. Vascular: No hyperdense vessel or unexpected calcification. Skull: Normal. Negative for fracture or focal lesion. Sinuses/Orbits: There is a remote fracture of the right lamina papyracea. The globes and orbits are otherwise unremarkable. There is mild mucosal thickening in the paranasal sinuses. ASPECTS [facility] Stroke Program Early CT Score) - Ganglionic level infarction (caudate, lentiform nuclei, internal capsule, insula, M1-M3 cortex): 7 - Supraganglionic infarction (M4-M6 cortex): 3 Total score (0-10 with 10 being normal): 10 CTA NECK FINDINGS Aortic arch:  The imaged aortic arch is normal. The origins of the major branch vessels are patent. The subclavian arteries are patent to the level imaged. Right carotid system: The right common, internal, and external carotid arteries are patent, without hemodynamically significant stenosis or occlusion. There is no dissection or aneurysm. The internal carotid artery is tortuous in the upper neck. Left carotid system: The left common, internal, and external carotid arteries are patent, without hemodynamically significant stenosis or occlusion. There is no dissection or aneurysm. The internal carotid artery is tortuous in the upper neck. Vertebral arteries: There is severe stenosis of the origin of the left vertebral artery (9-65). The remainder of the right vertebral artery is widely patent.  The left vertebral artery is patent, without hemodynamically significant stenosis or occlusion. There is no dissection or aneurysm. Skeleton: There is no acute osseous abnormality or suspicious osseous lesion. There is no visible canal hematoma. 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 There is no suspicious soft tissue abnormality in the neck. Upper chest: There is right apical scarring which is favored postsurgical given interval right upper lobectomy. Subpleural scarring in the left apex is noted. Review of the MIP images confirms the above findings CTA HEAD FINDINGS Anterior circulation: The intracranial ICAs are patent The bilateral MCAs are patent, without proximal stenosis or occlusion. The bilateral ACAs are patent, without proximal stenosis or occlusion. The anterior communicating artery is normal. There is no aneurysm or AVM. Posterior circulation: The bilateral V4 segments are patent. The basilar artery is patent. The major cerebellar arteries appear patent. The bilateral PCAs are patent, without proximal stenosis or occlusion. Small right larger than left posterior communicating arteries are identified. There is no aneurysm or AVM. Venous sinuses: Suboptimally evaluated due to bolus timing. Anatomic variants: None. Review of the MIP images confirms the above findings IMPRESSION: 1. No acute intracranial pathology.  ASPECTS is 10 2. No emergent large vessel occlusion. 3. 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 Otherwise, patent vasculature of the head and neck. Findings of the initial noncontrast head CT were paged to Dr. Amada Jupiter via Loretha Stapler at 1:25 pm. The CTA results were communicated at 1:37 pm. Electronically Signed   By: Lesia Hausen M.D.   On: 10/30/2022 13:43   CT ANGIO HEAD NECK W WO CM (CODE STROKE)  Result Date: 10/30/2022 CLINICAL DATA:  Code stroke. EXAM: CT ANGIOGRAPHY HEAD AND NECK TECHNIQUE: Multidetector CT imaging  of the head and neck was performed using the standard protocol during bolus administration of intravenous contrast. Multiplanar CT image reconstructions and MIPs were obtained to evaluate the vascular anatomy. Carotid stenosis measurements (when applicable) are obtained utilizing NASCET criteria, using the distal internal carotid diameter as the denominator. RADIATION DOSE REDUCTION: This exam was performed according to the departmental dose-optimization program which includes automated exposure control, adjustment of the mA and/or kV according to patient size and/or use of iterative reconstruction technique. CONTRAST:  64mL OMNIPAQUE IOHEXOL 350 MG/ML SOLN COMPARISON:  CT head 08/07/2006 FINDINGS: CT HEAD FINDINGS Brain: There is no acute intracranial hemorrhage, extra-axial fluid collection, or acute territorial infarct. Parenchymal volume is normal for age. The ventricles are normal in size. Gray-white differentiation is preserved. The pituitary and suprasellar region are normal. There is no mass lesion. There is no mass effect or midline shift. Vascular: No hyperdense vessel or unexpected calcification. Skull: Normal. Negative for fracture or focal lesion. Sinuses/Orbits: There is a remote fracture of the right  lamina papyracea. The globes and orbits are otherwise unremarkable. There is mild mucosal thickening in the paranasal sinuses. ASPECTS [facility] Stroke Program Early CT Score) - Ganglionic level infarction (caudate, lentiform nuclei, internal capsule, insula, M1-M3 cortex): 7 - Supraganglionic infarction (M4-M6 cortex): 3 Total score (0-10 with 10 being normal): 10 CTA NECK FINDINGS Aortic arch: The imaged aortic arch is normal. The origins of the major branch vessels are patent. The subclavian arteries are patent to the level imaged. Right carotid system: The right common, internal, and external carotid arteries are patent, without hemodynamically significant stenosis or occlusion. There is no dissection  or aneurysm. The internal carotid artery is tortuous in the upper neck. Left carotid system: The left common, internal, and external carotid arteries are patent, without hemodynamically significant stenosis or occlusion. There is no dissection or aneurysm. The internal carotid artery is tortuous in the upper neck. Vertebral arteries: There is severe stenosis of the origin of the left vertebral artery (9-65). The remainder of the right vertebral artery is widely patent. The left vertebral artery is patent, without hemodynamically significant stenosis or occlusion. There is no dissection or aneurysm. Skeleton: There is no acute osseous abnormality or suspicious osseous lesion. There is no visible canal hematoma. 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 There is no suspicious soft tissue abnormality in the neck. Upper chest: There is right apical scarring which is favored postsurgical given interval right upper lobectomy. Subpleural scarring in the left apex is noted. Review of the MIP images confirms the above findings CTA HEAD FINDINGS Anterior circulation: The intracranial ICAs are patent The bilateral MCAs are patent, without proximal stenosis or occlusion. The bilateral ACAs are patent, without proximal stenosis or occlusion. The anterior communicating artery is normal. There is no aneurysm or AVM. Posterior circulation: The bilateral V4 segments are patent. The basilar artery is patent. The major cerebellar arteries appear patent. The bilateral PCAs are patent, without proximal stenosis or occlusion. Small right larger than left posterior communicating arteries are identified. There is no aneurysm or AVM. Venous sinuses: Suboptimally evaluated due to bolus timing. Anatomic variants: None. Review of the MIP images confirms the above findings IMPRESSION: 1. No acute intracranial pathology.  ASPECTS is 10 2. No emergent large vessel occlusion. 3. Moderate to severe  stenosis of the right vertebral artery origin. Otherwise, patent vasculature of the head and neck. Findings of the initial noncontrast head CT were paged to Dr. Amada Jupiter via Loretha Stapler at 1:25 pm. The CTA results were communicated at 1:37 pm. Electronically Signed   By: Lesia Hausen M.D.   On: 10/30/2022 13:43    Procedures Procedures    Medications Ordered in ED Medications  potassium chloride SA (KLOR-CON M) CR tablet 40 mEq (has no administration in time range)  iohexol (OMNIPAQUE) 350 MG/ML injection 75 mL (75 mLs Intravenous Contrast Given 10/30/22 1330)  lactated ringers bolus 1,000 mL (1,000 mLs Intravenous New Bag/Given 10/30/22 1411)    ED Course/ Medical Decision Making/ A&P                             Medical Decision Making Amount and/or Complexity of Data Reviewed Labs: ordered.    Details: Hyperglycemia, no acidosis. Mild hypokalemia. Radiology: ordered.    Details: CT head without head bleed. ECG/medicine tests: independent interpretation performed.    Details: No acute ischemia/arrhythmia   Patient likely had seizure versus syncope.  He  did have some speech abnormalities which is why the code stroke was called but here his neuroexam has been unremarkable.  He was given some IV fluids and potassium replacement.  He is not sure what happened today.  Initial stroke workup is unremarkable though after discussion with Dr. Amada Jupiter he will need an MRI and EEG.  For now no antiepileptics.  Discussed with Dr. Chipper Herb who will admit.  No chest pain or shortness of breath so I have low suspicion for cardiac or pulmonary cause such as ACS, PE, etc.        Final Clinical Impression(s) / ED Diagnoses Final diagnoses:  Syncope and collapse    Rx / DC Orders ED Discharge Orders     None         Pricilla Loveless, MD 10/30/22 1512

## 2022-10-30 NOTE — Code Documentation (Signed)
Stroke Response Nurse Documentation Code Documentation  [name] is a 62 y.o. male arriving to [facility]  via [facility] on 10/30/2022 with past medical hx of cocaine abuse and cancer. Code stroke was activated by EMS.   Patient from home where he was LKW at 1110 and now complaining of confusion and unwitnessed fall . Per ems, patient was on the phone with a friend who stated that the patient was talking normal but upon arrival to his house he was found on the floor with the stove on and skillet hot. Patient does not recall falling nor what he was doing prior to being found.   Stroke team at the bedside on patient arrival. Labs drawn and patient cleared for CT by Dr. Criss Alvine. Patient to CT with team. NIHSS 0, see documentation for details and code stroke times. The following imaging was completed:  CT Head and CTA. Patient is not a candidate for IV Thrombolytic due to no focal deficits. Patient is not a candidate for IR due to no focal deficits.   Care Plan: Q2h NIHSS/VS BP goal <220/120.   Bedside handoff with ED RN Aundra Millet.    Felecia Jan  Stroke Response RN

## 2022-10-30 NOTE — ED Notes (Signed)
EEG in room at this time

## 2022-10-30 NOTE — ED Triage Notes (Signed)
Pt BIB [facility] from home. Pt found on the ground by friend at home. Per EMS pt has word salad and trouble "getting his thoughts."

## 2022-10-30 NOTE — Consult Note (Addendum)
Neurology Consultation  Reason for Consult: Code stroke  Referring Physician: Dr. Criss Alvine  CC: Code stroke; AMS  History is obtained from:patient and medical record   HPI: [name] is a 62 y.o. male with past medical history of ETOH, and Cancer who presents to [facility] ED via EMS for evaluation of AMS. En route to the hospital EMS noted patient having speech difficulty and activated a Code stroke. LKW 1110. Per EMS, he was on the phone with his friend and cooking @ 1100. His friend then came over and found him on the floor. He does not remember events of today, knows that he was cooking. Code stroke CT head with no acute process, ASPECTS 10. CTA with no LVO. NIHSS 0    LKW: 1110 IV thrombolysis given?: no, low NIHSS  EVT:  No LVO Premorbid modified Rankin scale (mRS):  0-Completely asymptomatic and back to baseline post-stroke  ROS: Full ROS was performed and is negative except as noted in the HPI.    Past Medical History:  Diagnosis Date   Alcoholism (HCC)    Arthritis    Cancer (HCC)    left tonsil cancer   History of chemotherapy    History of radiation therapy 09/01/17- 10/25/17   Left tonsil and bilateral neck 70 Gy in 35 fractions to gross disease, 63 gy in 35 fractions to high risk nodal echelons, and 56 Gy in 35 fraction to intermediate risk nodal echelons.      Family History  Problem Relation Age of Onset   Other Mother        pre-diabetes   Colon cancer Neg Hx    Esophageal cancer Neg Hx    Liver cancer Neg Hx    Pancreatic cancer Neg Hx    Rectal cancer Neg Hx    Stomach cancer Neg Hx      Social History:   reports that he has been smoking cigarettes. He has a 7.50 pack-year smoking history. He has never used smokeless tobacco. He reports current alcohol use of about 7.0 standard drinks of alcohol per week. He reports current drug use. Drugs: Cocaine and Marijuana.  Medications  Current Facility-Administered Medications:    lactated ringers bolus 1,000  mL, 1,000 mL, Intravenous, Once, Pricilla Loveless, MD  Current Outpatient Medications:    acetaminophen (TYLENOL) 500 MG tablet, Take 1,000 mg by mouth every 6 (six) hours as needed for mild pain., Disp: , Rfl:    ibuprofen (ADVIL) 200 MG tablet, Take 400 mg by mouth every 6 (six) hours as needed for mild pain., Disp: , Rfl:    levothyroxine (SYNTHROID) 50 MCG tablet, Take 1 tablet every morning on empty stomach with water, at least 1 hour before other medications and before eating., Disp: 90 tablet, Rfl: 4   oxyCODONE (OXY IR/ROXICODONE) 5 MG immediate release tablet, Take 1 tablet (5 mg total) by mouth 3 (three) times daily as needed for severe pain., Disp: 15 tablet, Rfl: 0   tetrahydrozoline 0.05 % ophthalmic solution, Place 1 drop into both eyes daily as needed (allergy)., Disp: , Rfl:   Facility-Administered Medications Ordered in Other Encounters:    lactated ringers infusion, , Intravenous, Continuous, Val Eagle, MD   Exam: Current vital signs: BP (!) 136/92   Pulse 81   Temp 98.4 F (36.9 C) (Oral)   Resp (!) 28   Ht 5\' 7"  (1.702 m)   Wt 53 kg   SpO2 98%   BMI 18.30 kg/m  Vital signs in last  24 hours: Temp:  [98.4 F (36.9 C)] 98.4 F (36.9 C) (01/12 1403) Pulse Rate:  [81-83] 81 (01/12 1335) Resp:  [25-28] 28 (01/12 1335) BP: (132-136)/(79-92) 136/92 (01/12 1335) SpO2:  [98 %-100 %] 98 % (01/12 1335) Weight:  [53 kg] 53 kg (01/12 1335)  GENERAL: Awake, alert in NAD HEENT: - Normocephalic and atraumatic, dry mm LUNGS - Clear to auscultation bilaterally with no wheezes CV - S1S2 RRR, no m/r/g, equal pulses bilaterally. ABDOMEN - Soft, nontender, nondistended with normoactive BS Ext: warm, well perfused, intact peripheral pulses, no edema  NEURO:  Mental Status: AA&O to self, month and place.  Language: speech is clear.  Naming, repetition, fluency, and comprehension intact. Cranial Nerves: PERRL EOMI, visual fields full, no facial asymmetry, facial  sensation intact, hearing intact, tongue/uvula/soft palate midline, normal sternocleidomastoid and trapezius muscle strength. No evidence of tongue atrophy or fibrillations Motor: He was able to hold all extremities without drift, but gave poor effort in bilateral lower extremities Tone: is normal and bulk is normal Sensation- Intact to light touch bilaterally Coordination: FTN intact bilaterally, did not cooperate well with lower extremity testing Gait- deferred  NIHSS 1a Level of Conscious.: 0 1b LOC Questions: 0 1c LOC Commands: 0 2 Best Gaze: 0 3 Visual: 0 4 Facial Palsy: 0 5a Motor Arm - left: 0 5b Motor Arm - Right: 0 6a Motor Leg - Left: 0 6b Motor Leg - Right: 0 7 Limb Ataxia: 0 8 Sensory: 0 9 Best Language: 0 10 Dysarthria: 0 11 Extinct. and Inatten.: 0 TOTAL: 0   Labs I have reviewed labs in epic and the results pertinent to this consultation are:  CBC    Component Value Date/Time   WBC 7.7 10/30/2022 1310   RBC 4.50 10/30/2022 1310   HGB 15.0 10/30/2022 1328   HGB 11.1 (L) 04/30/2022 1331   HGB 10.0 (L) 10/22/2017 1422   HCT 44.0 10/30/2022 1328   HCT 30.1 (L) 10/22/2017 1422   PLT 118 (L) 10/30/2022 1310   PLT 136 (L) 04/30/2022 1331   PLT 216 10/22/2017 1422   MCV 86.9 10/30/2022 1310   MCV 87.0 10/22/2017 1422   MCH 28.2 10/30/2022 1310   MCHC 32.5 10/30/2022 1310   RDW 15.1 10/30/2022 1310   RDW 13.9 10/22/2017 1422   LYMPHSABS 1.0 10/30/2022 1310   LYMPHSABS 0.2 (L) 10/22/2017 1422   MONOABS 0.9 10/30/2022 1310   MONOABS 0.7 10/22/2017 1422   EOSABS 0.0 10/30/2022 1310   EOSABS 0.0 10/22/2017 1422   BASOSABS 0.1 10/30/2022 1310   BASOSABS 0.0 10/22/2017 1422    CMP     Component Value Date/Time   NA 131 (L) 10/30/2022 1328   NA 136 10/13/2017 1320   K 3.2 (L) 10/30/2022 1328   K 4.5 10/13/2017 1320   CL 90 (L) 10/30/2022 1328   CO2 22 10/30/2022 1310   CO2 31 (H) 10/13/2017 1320   GLUCOSE 247 (H) 10/30/2022 1328   GLUCOSE 96  10/13/2017 1320   BUN 8 10/30/2022 1328   BUN 12.1 10/13/2017 1320   CREATININE 0.80 10/30/2022 1328   CREATININE 0.76 04/30/2022 1331   CREATININE 0.7 10/13/2017 1320   CALCIUM 9.4 10/30/2022 1310   CALCIUM 9.8 10/13/2017 1320   PROT 7.7 10/30/2022 1310   PROT 7.2 10/13/2017 1320   ALBUMIN 4.3 10/30/2022 1310   ALBUMIN 3.3 (L) 10/13/2017 1320   AST 51 (H) 10/30/2022 1310   AST 24 04/30/2022 1331   AST 29 10/13/2017 1320  ALT 20 10/30/2022 1310   ALT 10 04/30/2022 1331   ALT 26 10/13/2017 1320   ALKPHOS 60 10/30/2022 1310   ALKPHOS 79 10/13/2017 1320   BILITOT 1.0 10/30/2022 1310   BILITOT 0.5 04/30/2022 1331   BILITOT <0.22 10/13/2017 1320   GFRNONAA >60 10/30/2022 1310   GFRNONAA >60 04/30/2022 1331   GFRAA >60 12/28/2018 1534    Lipid Panel  No results found for: "CHOL", "TRIG", "HDL", "CHOLHDL", "VLDL", "LDLCALC", "LDLDIRECT"   Imaging I have reviewed the images obtained:  Code stroke CT-head No acute intracranial pathology. ASPECTS is 10   Code stroke CTA head and neck No emergent large vessel occlusion. 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 Otherwise, patent vasculature of the head and neck.  I have seen the patient reviewed the above note.  Assessment:  62 year old male who presents to [facility] ED via EMS as a code stroke for language difficulty which has subsequently improved.  The etiology of his passing out episode is unclear, syncope versus seizure.  I do not think TIA is at all likely given the fact that he does not remember why he ended up on the floor.  I do think an EEG would be reasonable as well as evaluation for other causes of syncope per ER.  I do not think I would start antiepileptics based solely on this event.  Recommendations: - MRI brain w/o to evaluate for stroke. If positive will continue with full stroke workup  - routine EEG to evaluate for seizure    Ritta Slot, MD [facility] [phone]  If 7pm- 7am, please page neurology on call as listed in AMION.

## 2022-10-30 NOTE — ED Notes (Signed)
Pt gone to MRI

## 2022-10-30 NOTE — H&P (Signed)
History and Physical    [name]:[id number] DOB: 12-27-1960 DOA: 10/30/2022  PCP: Fleet Contras, MD (Confirm with patient/family/NH records and if not entered, this has to be entered at [facility] point of entry) Patient coming from: Home  I have personally briefly reviewed patient's old medical records in [facility]  Chief Complaint: I fell  HPI: [name] is a 62 y.o. 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  Patient remains a heavy drinker he drinks at least half pint of liquor plus various amount of beer every day, last drink was yesterday evening.  He was last seen normal this morning around 11 and the same friend visited him around lunchtime and found him on the floor crawling.  Patient reported that he felt lightheadedness when standing up this morning " and blacked out".  He was LOC for unknown amount of time but recovered consciousness by himself denies any tongue biting or loss control of urine or bowel movement.  For the last 4-5 weeks, patient has had significant deterioration of ambulation, he described as very unsteady, "very clumsy" and shuffling of legs to a point that he had to start to use cane to ambulate to avoid fall but even so he sustained several falls during last 2 to 3 weeks and he said he almost always feels lightheadedness every day.  He denies any nauseous vomiting diarrhea.  No significant body weight change.  ED Course: Patient was found positive orthostatic vitals, BP dropped more than 20 when standing up.  And patient was symptomatic.  CT head negative for acute findings fracture or bleeding.  CT neck negative. UA, UDS pending.  Work sodium 129, potassium 3.2  Review of Systems: As per HPI otherwise 14 point review of systems negative.    Past Medical History:  Diagnosis Date   Alcoholism  (HCC)    Arthritis    Cancer (HCC)    left tonsil cancer   History of chemotherapy    History of radiation therapy 09/01/17- 10/25/17   Left tonsil and bilateral neck 70 Gy in 35 fractions to gross disease, 63 gy in 35 fractions to high risk nodal echelons, and 56 Gy in 35 fraction to intermediate risk nodal echelons.     Past Surgical History:  Procedure Laterality Date   BRONCHIAL BIOPSY  01/02/2022   Procedure: BRONCHIAL BIOPSIES;  Surgeon: Josephine Igo, DO;  Location: [facility];  Service: Pulmonary;;   BRONCHIAL BRUSHINGS  01/02/2022   Procedure: BRONCHIAL BRUSHINGS;  Surgeon: Josephine Igo, DO;  Location: [facility];  Service: Pulmonary;;   COLONOSCOPY     FIDUCIAL MARKER PLACEMENT  01/02/2022   Procedure: FIDUCIAL MARKER PLACEMENT;  Surgeon: Josephine Igo, DO;  Location: [facility];  Service: Pulmonary;;   INTERCOSTAL NERVE BLOCK Right 02/05/2022   Procedure: INTERCOSTAL NERVE BLOCK;  Surgeon: Loreli Slot, MD;  Location: [facility] OR;  Service: Thoracic;  Laterality: Right;   IR FLUORO GUIDE PORT INSERTION RIGHT  09/13/2017   IR GASTROSTOMY TUBE MOD SED  09/13/2017   IR GASTROSTOMY TUBE REMOVAL  03/28/2018   IR REMOVAL TUN ACCESS W/ PORT W/O FL MOD SED  03/28/2018   IR US GUIDE VASC ACCESS RIGHT  09/13/2017   MULTIPLE EXTRACTIONS WITH ALVEOLOPLASTY N/A 08/09/2017   Procedure: Extraction of tooth #'s 1-6, 11-17,21,22,26-30 and 32 with alveoloplasty and bilateral mandibular tori  reductions;  Surgeon: Charlynne Pander, DDS;  Location: [facility];  Service: Oral Surgery;  Laterality: N/A;   NODE DISSECTION Right 02/05/2022   Procedure: NODE DISSECTION;  Surgeon: Loreli Slot, MD;  Location: [facility] OR;  Service: Thoracic;  Laterality: Right;   tonsil biopsy     left tonsil     reports that he has been smoking cigarettes. He has a 7.50 pack-year smoking history. He has never used smokeless tobacco. He reports current alcohol use of about 7.0 standard drinks of alcohol  per week. He reports current drug use. Drugs: Cocaine and Marijuana.  No Known Allergies  Family History  Problem Relation Age of Onset   Other Mother        pre-diabetes   Colon cancer Neg Hx    Esophageal cancer Neg Hx    Liver cancer Neg Hx    Pancreatic cancer Neg Hx    Rectal cancer Neg Hx    Stomach cancer Neg Hx      Prior to Admission medications   Medication Sig Start Date End Date Taking? Authorizing Provider  acetaminophen (TYLENOL) 500 MG tablet Take 1,000 mg by mouth every 6 (six) hours as needed for mild pain.    [provider]  ibuprofen (ADVIL) 200 MG tablet Take 400 mg by mouth every 6 (six) hours as needed for mild pain.    [provider]  levothyroxine (SYNTHROID) 50 MCG tablet Take 1 tablet every morning on empty stomach with water, at least 1 hour before other medications and before eating. 03/27/22   Lonie Peak, MD  oxyCODONE (OXY IR/ROXICODONE) 5 MG immediate release tablet Take 1 tablet (5 mg total) by mouth 3 (three) times daily as needed for severe pain. 03/19/22   Loreli Slot, MD  tetrahydrozoline 0.05 % ophthalmic solution Place 1 drop into both eyes daily as needed (allergy).    [provider]    Physical Exam: Vitals:   10/30/22 1430 10/30/22 1445 10/30/22 1500 10/30/22 1515  BP: (!) 128/90 101/76 95/71 (!) 138/99  Pulse: 72 69 70 72  Resp: (!) 25 (!) 26 16 (!) 27  Temp:      TempSrc:      SpO2: 100% 99% 96% 100%  Weight:      Height:        Constitutional: NAD, calm, comfortable Vitals:   10/30/22 1430 10/30/22 1445 10/30/22 1500 10/30/22 1515  BP: (!) 128/90 101/76 95/71 (!) 138/99  Pulse: 72 69 70 72  Resp: (!) 25 (!) 26 16 (!) 27  Temp:      TempSrc:      SpO2: 100% 99% 96% 100%  Weight:      Height:       Eyes: PERRL, lids and conjunctivae normal ENMT: Mucous membranes are dry. Posterior pharynx clear of any exudate or lesions.Normal dentition.  Neck: normal, supple, no masses, no  thyromegaly Respiratory: clear to auscultation bilaterally, no wheezing, no crackles. Normal respiratory effort. No accessory muscle use.  Cardiovascular: Regular rate and rhythm, no murmurs / rubs / gallops. No extremity edema. 2+ pedal pulses. No carotid bruits.  Abdomen: no tenderness, no masses palpated. No hepatosplenomegaly. Bowel sounds positive.  Musculoskeletal: no clubbing / cyanosis. No joint deformity upper and lower extremities. Good ROM, no contractures. Normal muscle tone.  Skin: no rashes, lesions, ulcers. No induration Neurologic: CN 2-12 grossly intact. Sensation intact, DTR normal. Strength 5/5 in all 4.  heel to shin movement very clumsy B/L Psychiatric: Normal  judgment and insight. Alert and oriented x 3. Normal mood.     Labs on Admission: I have personally reviewed following labs and imaging studies  CBC: Recent Labs  Lab 10/30/22 1310 10/30/22 1328  WBC 7.7  --   NEUTROABS 5.6  --   HGB 12.7* 15.0  HCT 39.1 44.0  MCV 86.9  --   PLT 118*  --    Basic Metabolic Panel: Recent Labs  Lab 10/30/22 1310 10/30/22 1328  NA 129* 131*  K 3.2* 3.2*  CL 89* 90*  CO2 22  --   GLUCOSE 254* 247*  BUN 7 8  CREATININE 1.06 0.80  CALCIUM 9.4  --    GFR: Estimated Creatinine Clearance: 73.6 mL/min (by C-G formula based on SCr of 0.8 mg/dL). Liver Function Tests: Recent Labs  Lab 10/30/22 1310  AST 51*  ALT 20  ALKPHOS 60  BILITOT 1.0  PROT 7.7  ALBUMIN 4.3   No results for input(s): "LIPASE", "AMYLASE" in the last 168 hours. No results for input(s): "AMMONIA" in the last 168 hours. Coagulation Profile: Recent Labs  Lab 10/30/22 1310  INR 1.1   Cardiac Enzymes: No results for input(s): "CKTOTAL", "CKMB", "CKMBINDEX", "TROPONINI" in the last 168 hours. BNP (last 3 results) No results for input(s): "PROBNP" in the last 8760 hours. HbA1C: No results for input(s): "HGBA1C" in the last 72 hours. CBG: Recent Labs  Lab 10/30/22 1310  GLUCAP 251*    Lipid Profile: No results for input(s): "CHOL", "HDL", "LDLCALC", "TRIG", "CHOLHDL", "LDLDIRECT" in the last 72 hours. Thyroid Function Tests: No results for input(s): "TSH", "T4TOTAL", "FREET4", "T3FREE", "THYROIDAB" in the last 72 hours. Anemia Panel: No results for input(s): "VITAMINB12", "FOLATE", "FERRITIN", "TIBC", "IRON", "RETICCTPCT" in the last 72 hours. Urine analysis:    Component Value Date/Time   COLORURINE YELLOW 10/30/2022 1310   APPEARANCEUR CLEAR 10/30/2022 1310   LABSPEC >1.046 (H) 10/30/2022 1310   LABSPEC 1.020 10/06/2017 1456   PHURINE 7.0 10/30/2022 1310   GLUCOSEU 50 (A) 10/30/2022 1310   GLUCOSEU Negative 10/06/2017 1456   HGBUR NEGATIVE 10/30/2022 1310   BILIRUBINUR NEGATIVE 10/30/2022 1310   BILIRUBINUR Negative 10/06/2017 1456   KETONESUR NEGATIVE 10/30/2022 1310   PROTEINUR 100 (A) 10/30/2022 1310   UROBILINOGEN 0.2 10/06/2017 1456   NITRITE NEGATIVE 10/30/2022 1310   LEUKOCYTESUR NEGATIVE 10/30/2022 1310   LEUKOCYTESUR Negative 10/06/2017 1456    Radiological Exams on Admission: CT C-SPINE NO CHARGE  Result Date: 10/30/2022 CLINICAL DATA:  Code stroke EXAM: CT Cervical Spine with contrast TECHNIQUE: Multiplanar CT images of the cervical spine were reconstructed from contemporary CT of the Neck. RADIATION DOSE REDUCTION: This exam was performed according to the departmental dose-optimization program which includes automated exposure control, adjustment of the mA and/or kV according to patient size and/or use of iterative reconstruction technique. CONTRAST:  75 cc Omnipaque 350 COMPARISON:  CT neck 05/02/2021 FINDINGS: Alignment: There is reversal of the normal cervical lordosis centered at C5 with trace anterolisthesis of C3 on C4. There is no jumped or perched facet or other evidence of traumatic malalignment. Skull base and vertebrae: Skull base alignment is maintained. Vertebral body heights are preserved. There is no evidence of acute fracture. There  is no suspicious osseous lesion. Soft tissues and spinal canal: Choose Disc levels: There is disc space narrowing degenerative endplate change most advanced at C4-C5 and C5-C6. There is overall mild multilevel facet arthropathy. There is no evidence of high-grade spinal canal stenosis. There is right worse than left  neural foraminal stenosis C4-C5 and C5-C6. Upper chest: Postsurgical changes are again noted in the right upper lobe. Other: None. IMPRESSION: No acute fracture or traumatic malalignment of the cervical spine. Degenerative changes as above. Electronically Signed   By: Lesia Hausen M.D.   On: 10/30/2022 14:01   CT HEAD CODE STROKE WO CONTRAST  Result Date: 10/30/2022 CLINICAL DATA:  Code stroke. EXAM: CT ANGIOGRAPHY HEAD AND NECK TECHNIQUE: Multidetector CT imaging of the head and neck was performed using the standard protocol during bolus administration of intravenous contrast. Multiplanar CT image reconstructions and MIPs were obtained to evaluate the vascular anatomy. Carotid stenosis measurements (when applicable) are obtained utilizing NASCET criteria, using the distal internal carotid diameter as the denominator. RADIATION DOSE REDUCTION: This exam was performed according to the departmental dose-optimization program which includes automated exposure control, adjustment of the mA and/or kV according to patient size and/or use of iterative reconstruction technique. CONTRAST:  63mL OMNIPAQUE IOHEXOL 350 MG/ML SOLN COMPARISON:  CT head 08/07/2006 FINDINGS: CT HEAD FINDINGS Brain: There is no acute intracranial hemorrhage, extra-axial fluid collection, or acute territorial infarct. Parenchymal volume is normal for age. The ventricles are normal in size. Gray-white differentiation is preserved. The pituitary and suprasellar region are normal. There is no mass lesion. There is no mass effect or midline shift. Vascular: No hyperdense vessel or unexpected calcification. Skull: Normal. Negative for  fracture or focal lesion. Sinuses/Orbits: There is a remote fracture of the right lamina papyracea. The globes and orbits are otherwise unremarkable. There is mild mucosal thickening in the paranasal sinuses. ASPECTS [facility] Stroke Program Early CT Score) - Ganglionic level infarction (caudate, lentiform nuclei, internal capsule, insula, M1-M3 cortex): 7 - Supraganglionic infarction (M4-M6 cortex): 3 Total score (0-10 with 10 being normal): 10 CTA NECK FINDINGS Aortic arch: The imaged aortic arch is normal. The origins of the major branch vessels are patent. The subclavian arteries are patent to the level imaged. Right carotid system: The right common, internal, and external carotid arteries are patent, without hemodynamically significant stenosis or occlusion. There is no dissection or aneurysm. The internal carotid artery is tortuous in the upper neck. Left carotid system: The left common, internal, and external carotid arteries are patent, without hemodynamically significant stenosis or occlusion. There is no dissection or aneurysm. The internal carotid artery is tortuous in the upper neck. Vertebral arteries: There is severe stenosis of the origin of the left vertebral artery (9-65). The remainder of the right vertebral artery is widely patent. The left vertebral artery is patent, without hemodynamically significant stenosis or occlusion. There is no dissection or aneurysm. Skeleton: There is no acute osseous abnormality or suspicious osseous lesion. There is no visible canal hematoma. 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 There is no suspicious soft tissue abnormality in the neck. Upper chest: There is right apical scarring which is favored postsurgical given interval right upper lobectomy. Subpleural scarring in the left apex is noted. Review of the MIP images confirms the above findings CTA HEAD FINDINGS Anterior circulation: The intracranial ICAs are patent The  bilateral MCAs are patent, without proximal stenosis or occlusion. The bilateral ACAs are patent, without proximal stenosis or occlusion. The anterior communicating artery is normal. There is no aneurysm or AVM. Posterior circulation: The bilateral V4 segments are patent. The basilar artery is patent. The major cerebellar arteries appear patent. The bilateral PCAs are patent, without proximal stenosis or occlusion. Small right larger than left posterior communicating  arteries are identified. There is no aneurysm or AVM. Venous sinuses: Suboptimally evaluated due to bolus timing. Anatomic variants: None. Review of the MIP images confirms the above findings IMPRESSION: 1. No acute intracranial pathology.  ASPECTS is 10 2. No emergent large vessel occlusion. 3. 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 Otherwise, patent vasculature of the head and neck. Findings of the initial noncontrast head CT were paged to Dr. Amada Jupiter via Loretha Stapler at 1:25 pm. The CTA results were communicated at 1:37 pm. Electronically Signed   By: Lesia Hausen M.D.   On: 10/30/2022 13:43   CT ANGIO HEAD NECK W WO CM (CODE STROKE)  Result Date: 10/30/2022 CLINICAL DATA:  Code stroke. EXAM: CT ANGIOGRAPHY HEAD AND NECK TECHNIQUE: Multidetector CT imaging of the head and neck was performed using the standard protocol during bolus administration of intravenous contrast. Multiplanar CT image reconstructions and MIPs were obtained to evaluate the vascular anatomy. Carotid stenosis measurements (when applicable) are obtained utilizing NASCET criteria, using the distal internal carotid diameter as the denominator. RADIATION DOSE REDUCTION: This exam was performed according to the departmental dose-optimization program which includes automated exposure control, adjustment of the mA and/or kV according to patient size and/or use of iterative reconstruction technique. CONTRAST:  82mL OMNIPAQUE IOHEXOL 350 MG/ML SOLN COMPARISON:   CT head 08/07/2006 FINDINGS: CT HEAD FINDINGS Brain: There is no acute intracranial hemorrhage, extra-axial fluid collection, or acute territorial infarct. Parenchymal volume is normal for age. The ventricles are normal in size. Gray-white differentiation is preserved. The pituitary and suprasellar region are normal. There is no mass lesion. There is no mass effect or midline shift. Vascular: No hyperdense vessel or unexpected calcification. Skull: Normal. Negative for fracture or focal lesion. Sinuses/Orbits: There is a remote fracture of the right lamina papyracea. The globes and orbits are otherwise unremarkable. There is mild mucosal thickening in the paranasal sinuses. ASPECTS [facility] Stroke Program Early CT Score) - Ganglionic level infarction (caudate, lentiform nuclei, internal capsule, insula, M1-M3 cortex): 7 - Supraganglionic infarction (M4-M6 cortex): 3 Total score (0-10 with 10 being normal): 10 CTA NECK FINDINGS Aortic arch: The imaged aortic arch is normal. The origins of the major branch vessels are patent. The subclavian arteries are patent to the level imaged. Right carotid system: The right common, internal, and external carotid arteries are patent, without hemodynamically significant stenosis or occlusion. There is no dissection or aneurysm. The internal carotid artery is tortuous in the upper neck. Left carotid system: The left common, internal, and external carotid arteries are patent, without hemodynamically significant stenosis or occlusion. There is no dissection or aneurysm. The internal carotid artery is tortuous in the upper neck. Vertebral arteries: There is severe stenosis of the origin of the left vertebral artery (9-65). The remainder of the right vertebral artery is widely patent. The left vertebral artery is patent, without hemodynamically significant stenosis or occlusion. There is no dissection or aneurysm. Skeleton: There is no acute osseous abnormality or suspicious osseous  lesion. There is no visible canal hematoma. 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 There is no suspicious soft tissue abnormality in the neck. Upper chest: There is right apical scarring which is favored postsurgical given interval right upper lobectomy. Subpleural scarring in the left apex is noted. Review of the MIP images confirms the above findings CTA HEAD FINDINGS Anterior circulation: The intracranial ICAs are patent The bilateral MCAs are patent, without proximal stenosis or occlusion. The  bilateral ACAs are patent, without proximal stenosis or occlusion. The anterior communicating artery is normal. There is no aneurysm or AVM. Posterior circulation: The bilateral V4 segments are patent. The basilar artery is patent. The major cerebellar arteries appear patent. The bilateral PCAs are patent, without proximal stenosis or occlusion. Small right larger than left posterior communicating arteries are identified. There is no aneurysm or AVM. Venous sinuses: Suboptimally evaluated due to bolus timing. Anatomic variants: None. Review of the MIP images confirms the above findings IMPRESSION: 1. No acute intracranial pathology.  ASPECTS is 10 2. No emergent large vessel occlusion. 3. 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 Otherwise, patent vasculature of the head and neck. Findings of the initial noncontrast head CT were paged to Dr. Amada Jupiter via Loretha Stapler at 1:25 pm. The CTA results were communicated at 1:37 pm. Electronically Signed   By: Lesia Hausen M.D.   On: 10/30/2022 13:43    EKG: Independently reviewed.  Sinus, no acute ST changes  Assessment/Plan Active Problems:   Alcohol abuse   Ataxia   Syncope, vasovagal  (please populate well all problems here in Problem List. (For example, if patient is on BP meds at home and you resume or decide to hold them, it is a problem that needs to be her. Same for CAD, COPD, HLD and so  on)  Syncope -Likely vasovagal, volume status might be mild contracted, encourage increased p.o. intake -Probably related to alcohol abuse dehydration and acute Warnicke encephalopathy -Check orthostatic vital signs -Treat Warnicke's encephalopathy as below -Other DDx, arrhythmia less likely, will keep patient on telemetry monitor for 24 hours. No Hx of seizure and clinical picture not compatible.  Warnicke's encephalopathy -With significant bilateral lower extremity ataxia -IV thiamine 500 mg IV every 8 hours x 5 days then high-dose of p.o. thiamine tapering for 2 weeks -Patient reported " in the rehab 1000 times", will consult case manager -Currently nurse no symptoms signs of active withdrawal, start patient on CIWA protocol with as needed benzos  Hypokalemia -P.o. replacement, check magnesium level, repeat K level tomorrow  Hyperglycemia -No history of DM, check A1c -Cover with sliding scale  Alcohol abuse -As above  History of tonsillar CA status post chemoradiation -No active issue, outpatient follow-up with oncology  Moderate protein calorie malnutrition -BMI 18, likely related to alcohol abuse -Start protein supplement, consult dietitian for calorie count, may need outpatient GI follow-up.  DVT prophylaxis: Lovenox Code Status: Full code Family Communication: None at bedside Disposition Plan: Patient is sick with acute Warnicke encephalopathy requiring IV thiamine for 5 days, expect more than 2 midnight hospital stay. Consults called: Neurology Admission status: Tele admission   Emeline General MD [facility] Pager [phone]  10/30/2022, 3:45 PM

## 2022-10-30 NOTE — Progress Notes (Signed)
EEG complete - results pending.

## 2022-10-31 DIAGNOSIS — G9340 Encephalopathy, unspecified: Secondary | ICD-10-CM | POA: Diagnosis not present

## 2022-10-31 DIAGNOSIS — R55 Syncope and collapse: Secondary | ICD-10-CM | POA: Diagnosis not present

## 2022-10-31 DIAGNOSIS — R4182 Altered mental status, unspecified: Secondary | ICD-10-CM | POA: Diagnosis not present

## 2022-10-31 LAB — GLUCOSE, CAPILLARY
Glucose-Capillary: 105 mg/dL — ABNORMAL HIGH (ref 70–99)
Glucose-Capillary: 127 mg/dL — ABNORMAL HIGH (ref 70–99)

## 2022-10-31 LAB — BASIC METABOLIC PANEL
Anion gap: 10 (ref 5–15)
BUN: 6 mg/dL (ref 6–20)
CO2: 26 mmol/L (ref 22–32)
Calcium: 9 mg/dL (ref 8.9–10.3)
Chloride: 96 mmol/L — ABNORMAL LOW (ref 98–111)
Creatinine, Ser: 0.9 mg/dL (ref 0.61–1.24)
GFR, Estimated: >60 mL/min (ref >60)
Glucose, Bld: 112 mg/dL — ABNORMAL HIGH (ref 70–99)
Potassium: 3.9 mmol/L (ref 3.5–5.1)
Sodium: 132 mmol/L — ABNORMAL LOW (ref 135–145)

## 2022-10-31 LAB — CBG MONITORING, ED
Glucose-Capillary: 156 mg/dL — ABNORMAL HIGH (ref 70–99)
Glucose-Capillary: 169 mg/dL — ABNORMAL HIGH (ref 70–99)

## 2022-10-31 NOTE — Progress Notes (Signed)
   10/31/22 2235  Orthostatic Lying   BP- Lying (!) (S)  88/66  Pulse- Lying (S)  74  Orthostatic Sitting  BP- Sitting (!) (S)  73/55  Pulse- Sitting (S)  83  Orthostatic Standing at 0 minutes  BP- Standing at 0 minutes (!) (S)  73/55  Pulse- Standing at 0 minutes (S)  99  Orthostatic Standing at 3 minutes  BP- Standing at 3 minutes (!) (S)  65/54  Pulse- Standing at 3 minutes (S)  99  Oxygen Therapy  SpO2 (S)  100 %  O2 Device (S)  Room Air   Orthostatic VS as above. Patient denied any HA or dizziness, recent prn ativan given for CIWA score. On call MD notified.

## 2022-10-31 NOTE — ED Notes (Signed)
Patient has been cleaned dried, fresh bed linen, gown, and repositioned.

## 2022-10-31 NOTE — Discharge Instructions (Signed)
                  Intensive Outpatient Programs  [facility]    [facility] [address]     [address] [place],  [place]     [place], [place] [phone]      [phone]  [facility]   [facility]  (Inpatient and outpatient)  [phone] (Suboxone and Methadone) [address]           [phone]           [facility]    [facility] - Intensive Outpatient [address]     [address] [place], [place] [address]     [place], [place]  [phone]      [phone]  [facility] (Outpatient, Inpatient, [facility] (Groups and [facility]idental) (insurance only) [phone]    [place], [place]          [phone]       [facility]    [facility] (for caregivers and family) [address]     [address] [place], [place]     [place], [place] [phone]      [phone]  Residential Treatment Programs  [facility]  [place](2 years) Residential: 90 days)  [facility] ([facility] [address] [facility]      [address] [place], [place]     [place], [place] [phone]      [phone] or [phone]  [facility]    [facility] [address]      [address] [place], [place]     [place], [place] [phone]      [phone]  [facility]   [facility] ([facility]) [address]     [address] [place], [place] [address]     [place], [place] [phone]      [phone] Admissions: 8am-3pm M-F  [facility]: Residential Program [phone] Days)              [facility]: [facility]  [place], [place]     [place], [place]  [phone] or [phone]    (Walk in Hours over the weekend or by referral)   [facility]: [facility]:[phone] (for crisis  response 24 hours a day)

## 2022-10-31 NOTE — ED Notes (Signed)
ED TO INPATIENT HANDOFF REPORT  ED Nurse Name and Phone #: Angelica Chessman, [place] Lysle Dingwall Name/Age/Gender [name] 62 y.o. [name] Room/Bed: 043C/043C  Code Status   Code Status: Full Code  Home/SNF/Other Home Patient oriented to: self, place, time, and situation Is this baseline? Yes   Triage Complete: Triage complete  Chief Complaint Encephalopathy [G93.40]  Triage Note Pt BIB [facility] from home. Pt found on the ground by friend at home. Per EMS pt has word salad and trouble "getting his thoughts."    Allergies No Known Allergies  Level of Care/Admitting Diagnosis ED Disposition     ED Disposition  Admit   Condition  --   Comment  Hospital Area: [facility] [[id number]]  Level of Care: Telemetry Medical [104]  May admit patient to [facility] or [facility] if equivalent level of care is available:: No  Covid Evaluation: Asymptomatic - no recent exposure (last 10 days) testing not required  Diagnosis: Encephalopathy [341443]  Admitting Physician: Emeline General [[id number]]  Attending Physician: Emeline General [[id number]]  Certification:: I certify this patient will need inpatient services for at least 2 midnights  Estimated Length of Stay: 3          B Medical/Surgery History Past Medical History:  Diagnosis Date   Alcoholism (HCC)    Arthritis    Cancer (HCC)    left tonsil cancer   History of chemotherapy    History of radiation therapy 09/01/17- 10/25/17   Left tonsil and bilateral neck 70 Gy in 35 fractions to gross disease, 63 gy in 35 fractions to high risk nodal echelons, and 56 Gy in 35 fraction to intermediate risk nodal echelons.    Past Surgical History:  Procedure Laterality Date   BRONCHIAL BIOPSY  01/02/2022   Procedure: BRONCHIAL BIOPSIES;  Surgeon: Josephine Igo, DO;  Location: [facility] ENDOS[facility];  Service: Pulmonary;;   BRONCHIAL BRUSHINGS  01/02/2022   Procedure: BRONCHIAL BRUSHINGS;  Surgeon: Josephine Igo, DO;  Location: [facility]  ENDOSCOPY;  Service: Pulmonary;;   COLONOSCOPY     FIDUCIAL MARKER PLACEMENT  01/02/2022   Procedure: FIDUCIAL MARKER PLACEMENT;  Surgeon: Josephine Igo, DO;  Location: [facility]OS[facility];  Service: Pulmonary;;   INTERCOSTAL NERVE BLOCK Right 02/05/2022   Procedure: INTERCOSTAL NERVE BLOCK;  Surgeon: Loreli Slot, MD;  Location: [facility] OR;  Service: Thoracic;  Laterality: Right;   IR FLUORO GUIDE PORT INSERTION RIGHT  09/13/2017   IR GASTROSTOMY TUBE MOD SED  09/13/2017   IR GASTROSTOMY TUBE REMOVAL  03/28/2018   IR REMOVAL TUN ACCESS W/ PORT W/O FL MOD SED  03/28/2018   IR US GUIDE VASC ACCESS RIGHT  09/13/2017   MULTIPLE EXTRACTIONS WITH ALVEOLOPLASTY N/A 08/09/2017   Procedure: Extraction of tooth #'s 1-6, 11-17,21,22,26-30 and 32 with alveoloplasty and bilateral mandibular tori reductions;  Surgeon: Charlynne Pander, DDS;  Location: [facility] ORS;  Service: Oral Surgery;  Laterality: N/A;   NODE DISSECTION Right 02/05/2022   Procedure: NODE DISSECTION;  Surgeon: Loreli Slot, MD;  Location: [facility];  Service: Thoracic;  Laterality: Right;   tonsil biopsy     left tonsil     A IV Location/Drains/Wounds Patient Lines/Drains/Airways Status     Active Line/Drains/Airways     Name Placement date Placement time Site Days   Peripheral IV 10/30/22 18 G Left Antecubital 10/30/22  1335  Antecubital  1   Incision (Closed) 02/05/22 Chest Right 02/05/22  1119  -- 268  Incision - 3 Ports Chest 1: Right;Lateral;Upper 2: Right;Posterior;Upper 3: Right;Posterior;Lower 02/05/22  0855  -- 268            Intake/Output Last 24 hours  Intake/Output Summary (Last 24 hours) at 10/31/2022 1504 Last data filed at 10/30/2022 2325 Gross per 24 hour  Intake 100.43 ml  Output 250 ml  Net -149.57 ml    Labs/Imaging Results for orders placed or performed during the hospital encounter of 10/30/22 (from the past 48 hour(s))  Ethanol     Status: None   Collection Time: 10/30/22  1:10 PM  Result  Value Ref Range   Alcohol, Ethyl (B) <10 <10 mg/dL    Comment: (NOTE) Lowest detectable limit for serum alcohol is 10 mg/dL.  For medical purposes only. Performed at [facility], [address], [place], [place] [address]   Protime-INR     Status: None   Collection Time: 10/30/22  1:10 PM  Result Value Ref Range   Prothrombin Time 13.8 11.4 - 15.2 seconds   INR 1.1 0.8 - 1.2    Comment: (NOTE) INR goal varies based on device and disease states. Performed at [facility], [address] [place], [place] [address]   APTT     Status: None   Collection Time: 10/30/22  1:10 PM  Result Value Ref Range   aPTT 27 24 - 36 seconds    Comment: Performed at [facility], [address], [place], [place] [address]  CBC     Status: Abnormal   Collection Time: 10/30/22  1:10 PM  Result Value Ref Range   WBC 7.7 4.0 - 10.5 K/uL   RBC 4.50 4.22 - 5.81 MIL/uL   Hemoglobin 12.7 (L) 13.0 - 17.0 g/dL   HCT 40.3 52.4 - 81.8 %   MCV 86.9 80.0 - 100.0 fL   MCH 28.2 26.0 - 34.0 pg   MCHC 32.5 30.0 - 36.0 g/dL   RDW 59.0 93.1 - 12.1 %   Platelets 118 (L) 150 - 400 K/uL    Comment: REPEATED TO VERIFY   nRBC 0.0 0.0 - 0.2 %    Comment: Performed at [facility], [address], [place], [place] [address]  Differential     Status: Abnormal   Collection Time: 10/30/22  1:10 PM  Result Value Ref Range   Neutrophils Relative % 73 %   Neutro Abs 5.6 1.7 - 7.7 K/uL   Lymphocytes Relative 13 %   Lymphs Abs 1.0 0.7 - 4.0 K/uL   Monocytes Relative 12 %   Monocytes Absolute 0.9 0.1 - 1.0 K/uL   Eosinophils Relative 0 %   Eosinophils Absolute 0.0 0.0 - 0.5 K/uL   Basophils Relative 1 %   Basophils Absolute 0.1 0.0 - 0.1 K/uL   Immature Granulocytes 1 %   Abs Immature Granulocytes 0.09 (H) 0.00 - 0.07 K/uL    Comment: Performed at [facility], [address] [place], [place] [address]  Comprehensive metabolic panel     Status: Abnormal   Collection Time: 10/30/22   1:10 PM  Result Value Ref Range   Sodium 129 (L) 135 - 145 mmol/L   Potassium 3.2 (L) 3.5 - 5.1 mmol/L   Chloride 89 (L) 98 - 111 mmol/L   CO2 22 22 - 32 mmol/L   Glucose, Bld 254 (H) 70 - 99 mg/dL    Comment: Glucose reference range applies only to samples taken after fasting for  at least 8 hours.   BUN 7 6 - 20 mg/dL   Creatinine, Ser 1.06 0.61 - 1.24 mg/dL   Calcium 9.4 8.9 - 10.3 mg/dL   Total Protein 7.7 6.5 - 8.1 g/dL   Albumin 4.3 3.5 - 5.0 g/dL   AST 51 (H) 15 - 41 U/L   ALT 20 0 - 44 U/L   Alkaline Phosphatase 60 38 - 126 U/L   Total Bilirubin 1.0 0.3 - 1.2 mg/dL   GFR, Estimated >60 >60 mL/min    Comment: (NOTE) Calculated using the CKD-EPI Creatinine Equation (2021)    Anion gap 18 (H) 5 - 15    Comment: Performed at [facility] [address] [place], [place] [address]  Urine rapid drug screen (hosp performed)     Status: None   Collection Time: 10/30/22  1:10 PM  Result Value Ref Range   Opiates NONE DETECTED NONE DETECTED   Cocaine NONE DETECTED NONE DETECTED   Benzodiazepines NONE DETECTED NONE DETECTED   Amphetamines NONE DETECTED NONE DETECTED   Tetrahydrocannabinol NONE DETECTED NONE DETECTED   Barbiturates NONE DETECTED NONE DETECTED    Comment: (NOTE) DRUG SCREEN FOR MEDICAL PURPOSES ONLY.  IF CONFIRMATION IS NEEDED FOR ANY PURPOSE, NOTIFY LAB WITHIN 5 DAYS.  LOWEST DETECTABLE LIMITS FOR URINE DRUG SCREEN Drug Class                     Cutoff (ng/mL) Amphetamine and metabolites    1000 Barbiturate and metabolites    200 Benzodiazepine                 200 Opiates and metabolites        300 Cocaine and metabolites        300 THC                            50 Performed at [facility], [address] [place], [place] [address]   Urinalysis, Routine w reflex microscopic     Status: Abnormal   Collection Time: 10/30/22  1:10 PM  Result Value Ref Range   Color, Urine YELLOW YELLOW   APPearance CLEAR CLEAR   Specific Gravity, Urine  >1.046 (H) 1.005 - 1.030   pH 7.0 5.0 - 8.0   Glucose, UA 50 (A) NEGATIVE mg/dL   Hgb urine dipstick NEGATIVE NEGATIVE   Bilirubin Urine NEGATIVE NEGATIVE   Ketones, ur NEGATIVE NEGATIVE mg/dL   Protein, ur 100 (A) NEGATIVE mg/dL   Nitrite NEGATIVE NEGATIVE   Leukocytes,Ua NEGATIVE NEGATIVE   RBC / HPF 0-5 0 - 5 RBC/hpf   WBC, UA 0-5 0 - 5 WBC/hpf   Bacteria, UA NONE SEEN NONE SEEN   Squamous Epithelial / HPF 0-5 0 - 5 /HPF    Comment: Performed at [facility] [address], [place],  [address]  CBG monitoring, ED     Status: Abnormal   Collection Time: 10/30/22  1:10 PM  Result Value Ref Range   Glucose-Capillary 251 (H) 70 - 99 mg/dL    Comment: Glucose reference range applies only to samples taken after fasting for at least 8 hours.  I-stat chem 8, ED     Status: Abnormal   Collection Time: 10/30/22  1:28 PM  Result Value Ref Range   Sodium 131 (L) 135 - 145 mmol/L   Potassium 3.2 (L) 3.5 - 5.1 mmol/L   Chloride 90 (  L) 98 - 111 mmol/L   BUN 8 6 - 20 mg/dL   Creatinine, Ser 0.02 0.61 - 1.24 mg/dL   Glucose, Bld 245 (H) 70 - 99 mg/dL    Comment: Glucose reference range applies only to samples taken after fasting for at least 8 hours.   Calcium, Ion 1.12 (L) 1.15 - 1.40 mmol/L   TCO2 23 22 - 32 mmol/L   Hemoglobin 15.0 13.0 - 17.0 g/dL   HCT 52.4 63.2 - 33.3 %  Hemoglobin A1c     Status: Abnormal   Collection Time: 10/30/22  3:54 PM  Result Value Ref Range   Hgb A1c MFr Bld 6.1 (H) 4.8 - 5.6 %    Comment: (NOTE) Pre diabetes:          5.7%-6.4%  Diabetes:              >6.4%  Glycemic control for   <7.0% adults with diabetes    Mean Plasma Glucose 128.37 mg/dL    Comment: Performed at [facility], [address], [place], [place] [address]  CBG monitoring, ED     Status: Abnormal   Collection Time: 10/30/22  5:21 PM  Result Value Ref Range   Glucose-Capillary 112 (H) 70 - 99 mg/dL    Comment: Glucose reference range applies only to samples  taken after fasting for at least 8 hours.  HIV Antibody (routine testing w rflx)     Status: None   Collection Time: 10/30/22  6:17 PM  Result Value Ref Range   HIV Screen 4th Generation wRfx Non Reactive Non Reactive    Comment: Performed at [facility], [address], [place], [place] [address]  CBG monitoring, ED     Status: Abnormal   Collection Time: 10/30/22 10:28 PM  Result Value Ref Range   Glucose-Capillary 137 (H) 70 - 99 mg/dL    Comment: Glucose reference range applies only to samples taken after fasting for at least 8 hours.  Basic metabolic panel     Status: Abnormal   Collection Time: 10/31/22  6:05 AM  Result Value Ref Range   Sodium 132 (L) 135 - 145 mmol/L   Potassium 3.9 3.5 - 5.1 mmol/L   Chloride 96 (L) 98 - 111 mmol/L   CO2 26 22 - 32 mmol/L   Glucose, Bld 112 (H) 70 - 99 mg/dL    Comment: Glucose reference range applies only to samples taken after fasting for at least 8 hours.   BUN 6 6 - 20 mg/dL   Creatinine, Ser 8.59 0.61 - 1.24 mg/dL   Calcium 9.0 8.9 - 38.4 mg/dL   GFR, Estimated >11 >52 mL/min    Comment: (NOTE) Calculated using the CKD-EPI Creatinine Equation (2021)    Anion gap 10 5 - 15    Comment: Performed at [facility], [address] [place], [place] [address]  CBG monitoring, ED     Status: Abnormal   Collection Time: 10/31/22  7:41 AM  Result Value Ref Range   Glucose-Capillary 156 (H) 70 - 99 mg/dL    Comment: Glucose reference range applies only to samples taken after fasting for at least 8 hours.  CBG monitoring, ED     Status: Abnormal   Collection Time: 10/31/22 12:39 PM  Result Value Ref Range   Glucose-Capillary 169 (H) 70 - 99 mg/dL    Comment: Glucose reference range applies only to samples taken after fasting for at least 8 hours.  EEG adult  Result Date: 10/31/2022 Lora Havens, MD     10/31/2022  7:47 AM Patient Name: REGIE BUNNER MRN: [id number] Epilepsy Attending: Lora Havens Referring  Physician/Provider: Greta Doom, MD Date: 10/30/2022 Duration: 30.08 mins Patient history: 62 year old [name] who [facility] ED via EMS as a code stroke for language difficulty which has subsequently improved. EEG to evaluate for seizure. Level of alertness: Awake AEDs during EEG study: None Technical aspects: This EEG study was done with scalp electrodes positioned according to the 10-20 International system of electrode placement. Electrical activity was reviewed with band pass filter of 1-70Hz , sensitivity of 7 uV/mm, display speed of 40mm/sec with a 60Hz  notched filter applied as appropriate. EEG data were recorded continuously and digitally stored.  Video monitoring was available and reviewed as appropriate. Description: The posterior dominant rhythm consists of 9 Hz activity of moderate voltage (25-35 uV) seen predominantly in posterior head regions, symmetric and reactive to eye opening and eye closing.  Physiologic photic driving was not seen during photic stimulation.  Hyperventilation was not performed.   IMPRESSION: This study is within normal limits. No seizures or epileptiform discharges were seen throughout the recording. A normal interictal EEG does not exclude the diagnosis of epilepsy. Lora Havens   MR BRAIN WO CONTRAST  Result Date: 10/30/2022 CLINICAL DATA:  Initial evaluation for neuro deficit, stroke suspected. EXAM: MRI HEAD WITHOUT CONTRAST TECHNIQUE: Multiplanar, multiecho pulse sequences of the brain and surrounding structures were obtained without intravenous contrast. COMPARISON:  Prior CTs from earlier the same day. FINDINGS: Brain: Examination mildly degraded by motion artifact. Cerebral volume within normal limits. No significant cerebral white matter disease for age. No evidence for acute or subacute ischemia. Gray-white matter differentiation maintained. No areas of chronic cortical infarction. No acute intracranial hemorrhage. Single punctate chronic  microhemorrhage noted within the right thalamus. No mass lesion, midline shift or mass effect. No hydrocephalus or extra-axial fluid collection. Pituitary gland and suprasellar region within normal limits. Vascular: Major intracranial vascular flow voids are maintained. Skull and upper cervical spine: Craniocervical junction normal. Bone marrow signal intensity within normal limits. No scalp soft tissue abnormality. Sinuses/Orbits: Globes orbital soft tissues demonstrate no acute finding. Remote posttraumatic defect noted at the right lamina papyracea. Scattered mucosal thickening noted about the ethmoidal air cells and maxillary sinuses. No significant mastoid effusion. Other: None. IMPRESSION: Normal brain MRI for age. No acute intracranial abnormality identified. Electronically Signed   By: Jeannine Boga M.D.   On: 10/30/2022 22:24   CT C-SPINE NO CHARGE  Result Date: 10/30/2022 CLINICAL DATA:  Code stroke EXAM: CT Cervical Spine with contrast TECHNIQUE: Multiplanar CT images of the cervical spine were reconstructed from contemporary CT of the Neck. RADIATION DOSE REDUCTION: This exam was performed according to the departmental dose-optimization program which includes automated exposure control, adjustment of the mA and/or kV according to patient size and/or use of iterative reconstruction technique. CONTRAST:  75 cc Omnipaque 350 COMPARISON:  CT neck 05/02/2021 FINDINGS: Alignment: There is reversal of the normal cervical lordosis centered at C5 with trace anterolisthesis of C3 on C4. There is no jumped or perched facet or other evidence of traumatic malalignment. Skull base and vertebrae: Skull base alignment is maintained. Vertebral body heights are preserved. There is no evidence of acute fracture. There is no suspicious osseous lesion. Soft tissues and spinal canal: Choose Disc levels: There is disc space narrowing degenerative endplate change most advanced at C4-C5 and C5-C6. There is  overall  mild multilevel facet arthropathy. There is no evidence of high-grade spinal canal stenosis. There is right worse than left neural foraminal stenosis C4-C5 and C5-C6. Upper chest: Postsurgical changes are again noted in the right upper lobe. Other: None. IMPRESSION: No acute fracture or traumatic malalignment of the cervical spine. Degenerative changes as above. Electronically Signed   By: Lesia Hausen M.D.   On: 10/30/2022 14:01   CT HEAD CODE STROKE WO CONTRAST  Result Date: 10/30/2022 CLINICAL DATA:  Code stroke. EXAM: CT ANGIOGRAPHY HEAD AND NECK TECHNIQUE: Multidetector CT imaging of the head and neck was performed using the standard protocol during bolus administration of intravenous contrast. Multiplanar CT image reconstructions and MIPs were obtained to evaluate the vascular anatomy. Carotid stenosis measurements (when applicable) are obtained utilizing NASCET criteria, using the distal internal carotid diameter as the denominator. RADIATION DOSE REDUCTION: This exam was performed according to the departmental dose-optimization program which includes automated exposure control, adjustment of the mA and/or kV according to patient size and/or use of iterative reconstruction technique. CONTRAST:  49mL OMNIPAQUE IOHEXOL 350 MG/ML SOLN COMPARISON:  CT head 08/07/2006 FINDINGS: CT HEAD FINDINGS Brain: There is no acute intracranial hemorrhage, extra-axial fluid collection, or acute territorial infarct. Parenchymal volume is normal for age. The ventricles are normal in size. Gray-white differentiation is preserved. The pituitary and suprasellar region are normal. There is no mass lesion. There is no mass effect or midline shift. Vascular: No hyperdense vessel or unexpected calcification. Skull: Normal. Negative for fracture or focal lesion. Sinuses/Orbits: There is a remote fracture of the right lamina papyracea. The globes and orbits are otherwise unremarkable. There is mild mucosal thickening in the paranasal  sinuses. ASPECTS [facility] Stroke Program Early CT Score) - Ganglionic level infarction (caudate, lentiform nuclei, internal capsule, insula, M1-M3 cortex): 7 - Supraganglionic infarction (M4-M6 cortex): 3 Total score (0-10 with 10 being normal): 10 CTA NECK FINDINGS Aortic arch: The imaged aortic arch is normal. The origins of the major branch vessels are patent. The subclavian arteries are patent to the level imaged. Right carotid system: The right common, internal, and external carotid arteries are patent, without hemodynamically significant stenosis or occlusion. There is no dissection or aneurysm. The internal carotid artery is tortuous in the upper neck. Left carotid system: The left common, internal, and external carotid arteries are patent, without hemodynamically significant stenosis or occlusion. There is no dissection or aneurysm. The internal carotid artery is tortuous in the upper neck. Vertebral arteries: There is severe stenosis of the origin of the left vertebral artery (9-65). The remainder of the right vertebral artery is widely patent. The left vertebral artery is patent, without hemodynamically significant stenosis or occlusion. There is no dissection or aneurysm. Skeleton: There is no acute osseous abnormality or suspicious osseous lesion. There is no visible canal hematoma. 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 There is no suspicious soft tissue abnormality in the neck. Upper chest: There is right apical scarring which is favored postsurgical given interval right upper lobectomy. Subpleural scarring in the left apex is noted. Review of the MIP images confirms the above findings CTA HEAD FINDINGS Anterior circulation: The intracranial ICAs are patent The bilateral MCAs are patent, without proximal stenosis or occlusion. The bilateral ACAs are patent, without proximal stenosis or occlusion. The anterior communicating artery is normal. There is no  aneurysm or AVM. Posterior circulation: The bilateral V4 segments are patent. The basilar artery is patent. The major  cerebellar arteries appear patent. The bilateral PCAs are patent, without proximal stenosis or occlusion. Small right larger than left posterior communicating arteries are identified. There is no aneurysm or AVM. Venous sinuses: Suboptimally evaluated due to bolus timing. Anatomic variants: None. Review of the MIP images confirms the above findings IMPRESSION: 1. No acute intracranial pathology.  ASPECTS is 10 2. No emergent large vessel occlusion. 3. 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 Otherwise, patent vasculature of the head and neck. Findings of the initial noncontrast head CT were paged to Dr. Amada Jupiter via Loretha Stapler at 1:25 pm. The CTA results were communicated at 1:37 pm. Electronically Signed   By: Lesia Hausen M.D.   On: 10/30/2022 13:43   CT ANGIO HEAD NECK W WO CM (CODE STROKE)  Result Date: 10/30/2022 CLINICAL DATA:  Code stroke. EXAM: CT ANGIOGRAPHY HEAD AND NECK TECHNIQUE: Multidetector CT imaging of the head and neck was performed using the standard protocol during bolus administration of intravenous contrast. Multiplanar CT image reconstructions and MIPs were obtained to evaluate the vascular anatomy. Carotid stenosis measurements (when applicable) are obtained utilizing NASCET criteria, using the distal internal carotid diameter as the denominator. RADIATION DOSE REDUCTION: This exam was performed according to the departmental dose-optimization program which includes automated exposure control, adjustment of the mA and/or kV according to patient size and/or use of iterative reconstruction technique. CONTRAST:  53mL OMNIPAQUE IOHEXOL 350 MG/ML SOLN COMPARISON:  CT head 08/07/2006 FINDINGS: CT HEAD FINDINGS Brain: There is no acute intracranial hemorrhage, extra-axial fluid collection, or acute territorial infarct. Parenchymal volume is normal for age.  The ventricles are normal in size. Gray-white differentiation is preserved. The pituitary and suprasellar region are normal. There is no mass lesion. There is no mass effect or midline shift. Vascular: No hyperdense vessel or unexpected calcification. Skull: Normal. Negative for fracture or focal lesion. Sinuses/Orbits: There is a remote fracture of the right lamina papyracea. The globes and orbits are otherwise unremarkable. There is mild mucosal thickening in the paranasal sinuses. ASPECTS [facility] Stroke Program Early CT Score) - Ganglionic level infarction (caudate, lentiform nuclei, internal capsule, insula, M1-M3 cortex): 7 - Supraganglionic infarction (M4-M6 cortex): 3 Total score (0-10 with 10 being normal): 10 CTA NECK FINDINGS Aortic arch: The imaged aortic arch is normal. The origins of the major branch vessels are patent. The subclavian arteries are patent to the level imaged. Right carotid system: The right common, internal, and external carotid arteries are patent, without hemodynamically significant stenosis or occlusion. There is no dissection or aneurysm. The internal carotid artery is tortuous in the upper neck. Left carotid system: The left common, internal, and external carotid arteries are patent, without hemodynamically significant stenosis or occlusion. There is no dissection or aneurysm. The internal carotid artery is tortuous in the upper neck. Vertebral arteries: There is severe stenosis of the origin of the left vertebral artery (9-65). The remainder of the right vertebral artery is widely patent. The left vertebral artery is patent, without hemodynamically significant stenosis or occlusion. There is no dissection or aneurysm. Skeleton: There is no acute osseous abnormality or suspicious osseous lesion. There is no visible canal hematoma. 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 There is no suspicious soft tissue abnormality in the neck. Upper  chest: There is right apical scarring which is favored postsurgical given interval right upper lobectomy. Subpleural scarring in the left apex is noted. Review of the MIP images confirms the above findings  CTA HEAD FINDINGS Anterior circulation: The intracranial ICAs are patent The bilateral MCAs are patent, without proximal stenosis or occlusion. The bilateral ACAs are patent, without proximal stenosis or occlusion. The anterior communicating artery is normal. There is no aneurysm or AVM. Posterior circulation: The bilateral V4 segments are patent. The basilar artery is patent. The major cerebellar arteries appear patent. The bilateral PCAs are patent, without proximal stenosis or occlusion. Small right larger than left posterior communicating arteries are identified. There is no aneurysm or AVM. Venous sinuses: Suboptimally evaluated due to bolus timing. Anatomic variants: None. Review of the MIP images confirms the above findings IMPRESSION: 1. No acute intracranial pathology.  ASPECTS is 10 2. No emergent large vessel occlusion. 3. 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 Otherwise, patent vasculature of the head and neck. Findings of the initial noncontrast head CT were paged to Dr. Amada Jupiter via Loretha Stapler at 1:25 pm. The CTA results were communicated at 1:37 pm. Electronically Signed   By: Lesia Hausen M.D.   On: 10/30/2022 13:43    Pending Labs Unresulted Labs (From admission, onward)     Start     Ordered   10/30/22 1827  Magnesium  Once,   AD        10/30/22 1827   10/30/22 1807  Vitamin B1  ONCE - URGENT,   URGENT        10/30/22 1806            Vitals/Pain Today's Vitals   10/31/22 0900 10/31/22 1000 10/31/22 1042 10/31/22 1400  BP: 97/72 99/85  94/69  Pulse: 86 (!) 103  79  Resp: 18 19  19   Temp:   99.5 F (37.5 C)   TempSrc:   Oral   SpO2: 100% 100%  97%  Weight:      Height:      PainSc:        Isolation Precautions No active  isolations  Medications Medications  thiamine (VITAMIN B1) 500 mg in normal saline (50 mL) IVPB (0 mg Intravenous Stopped 10/31/22 1348)  acetaminophen (TYLENOL) tablet 1,000 mg (1,000 mg Oral Given 10/30/22 1648)  oxyCODONE (Oxy IR/ROXICODONE) immediate release tablet 5 mg (5 mg Oral Given 10/30/22 2233)  levothyroxine (SYNTHROID) tablet 50 mcg (50 mcg Oral Given 10/31/22 0559)  naphazoline-glycerin (CLEAR EYES REDNESS) ophth solution 1 drop (has no administration in time range)  LORazepam (ATIVAN) tablet 1-4 mg ( Oral See Alternative 10/30/22 2233)    Or  LORazepam (ATIVAN) tablet 2 mg (2 mg Oral Given 10/30/22 2233)  folic acid (FOLVITE) tablet 1 mg (1 mg Oral Given 10/31/22 1058)  multivitamin with minerals tablet 1 tablet (1 tablet Oral Given 10/31/22 1058)  feeding supplement (ENSURE ENLIVE / ENSURE PLUS) liquid 237 mL (237 mLs Oral Not Given 10/31/22 1201)  insulin aspart (novoLOG) injection 0-15 Units (3 Units Subcutaneous Given 10/31/22 1258)  enoxaparin (LOVENOX) injection 40 mg (40 mg Subcutaneous Given 10/30/22 2234)  iohexol (OMNIPAQUE) 350 MG/ML injection 75 mL (75 mLs Intravenous Contrast Given 10/30/22 1330)  lactated ringers bolus 1,000 mL (0 mLs Intravenous Stopped 10/30/22 1624)  potassium chloride SA (KLOR-CON M) CR tablet 40 mEq (40 mEq Oral Given 10/30/22 1508)    Mobility walks Low fall risk   Focused Assessments Neuro Assessment Handoff:  Swallow screen pass? Yes  Cardiac Rhythm: Normal sinus rhythm NIH Stroke Scale  Dizziness Present: No Headache Present: No Interval: Initial Level of Consciousness (1a.)   : Alert, keenly responsive LOC Questions (  1b. )   : Answers both questions correctly LOC Commands (1c. )   : Performs both tasks correctly Best Gaze (2. )  : Normal Visual (3. )  : No visual loss Facial Palsy (4. )    : Normal symmetrical movements Motor Arm, Left (5a. )   : No drift Motor Arm, Right (5b. ) : No drift Motor Leg, Left (6a. )  : No drift Motor  Leg, Right (6b. ) : No drift Limb Ataxia (7. ): Absent Sensory (8. )  : Normal, no sensory loss Best Language (9. )  : No aphasia Dysarthria (10. ): Normal Extinction/Inattention (11.)   : No Abnormality Complete NIHSS TOTAL: 0 Last date known well: 10/30/22 Last time known well: 1110 Neuro Assessment: Within Defined Limits Neuro Checks:   Initial (10/30/22 1315)  Has TPA been given? No If patient is a Neuro Trauma and patient is going to OR before floor call report to 4N Charge nurse: [phone] or [phone]  ,.   R Recommendations: See Admitting Provider Note  Report given to:   Additional Notes: foul odor noted, misses while using urinal,

## 2022-10-31 NOTE — Procedures (Signed)
Patient Name: KYL GIVLER  MRN: [id number]  Epilepsy Attending: Lora Havens  Referring Physician/Provider: Greta Doom, MD  Date: 10/30/2022  Duration: 30.08 mins  Patient history: 62 year old male who presents to [facility] ED via EMS as a code stroke for language difficulty which has subsequently improved. EEG to evaluate for seizure.  Level of alertness: Awake  AEDs during EEG study: None  Technical aspects: This EEG study was done with scalp electrodes positioned according to the 10-20 International system of electrode placement. Electrical activity was reviewed with band pass filter of 1-70Hz , sensitivity of 7 uV/mm, display speed of 4mm/sec with a 60Hz  notched filter applied as appropriate. EEG data were recorded continuously and digitally stored.  Video monitoring was available and reviewed as appropriate.  Description: The posterior dominant rhythm consists of 9 Hz activity of moderate voltage (25-35 uV) seen predominantly in posterior head regions, symmetric and reactive to eye opening and eye closing.  Physiologic photic driving was not seen during photic stimulation.  Hyperventilation was not performed.     IMPRESSION: This study is within normal limits. No seizures or epileptiform discharges were seen throughout the recording.  A normal interictal EEG does not exclude the diagnosis of epilepsy.  Natahsa Marian Barbra Sarks

## 2022-10-31 NOTE — Progress Notes (Signed)
PROGRESS NOTE    [name]  OFL:[id number] DOB: [birth year] DOA: 10/30/2022 PCP: Fleet Contras, MD    Brief Narrative:  [name] is a 62 y.o. 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   Patient remains a heavy drinker he drinks at least half pint of liquor plus various amount of beer every day, last drink was yesterday evening.  He was last seen normal this morning around 11 and the same friend visited him around lunchtime and found him on the floor crawling.  Patient reported that he felt lightheadedness when standing up this morning " and blacked out".  He was LOC for unknown amount of time but recovered consciousness by himself denies any tongue biting or loss control of urine or bowel movement.  For the last 4-5 weeks, patient has had significant deterioration of ambulation, he described as very unsteady, "very clumsy" and shuffling of legs to a point that he had to start to use cane to ambulate to avoid fall but even so he sustained several falls during last 2 to 3 weeks and he said he almost always feels lightheadedness every day.  He denies any nauseous vomiting diarrhea.  No significant body weight change   Assessment and Plan: Syncope -Likely vasovagal, volume status might be mild contracted, encourage increased p.o. intake -Probably related to alcohol abuse dehydration and acute Warnicke encephalopathy -Check orthostatic vital signs -Treat Warnicke's encephalopathy as below -Other DDx, arrhythmia less likely, will keep patient on telemetry monitor for 24 hours. No Hx of seizure and clinical picture not compatible.   Warnicke's encephalopathy -With significant bilateral lower extremity ataxia -IV thiamine 500 mg IV every 8 hours x 5 days then high-dose of p.o. thiamine tapering for 2 weeks PT eval    Hypokalemia -replace   Hyperglycemia -A1C: 6.1 -Cover with sliding scale   Alcohol abuse -As above   History of tonsillar CA status post chemoradiation -No active issue, outpatient follow-up with oncology   Moderate protein calorie malnutrition -BMI 18, likely related to alcohol abuse -Start protein supplement.    DVT prophylaxis: enoxaparin (LOVENOX) injection 40 mg Start: 10/30/22 2200    Code Status: Full Code Family Communication: none at bedside  Disposition Plan:  Level of care: Telemetry Medical Status is: Inpatient Remains inpatient appropriate because: need PT eval    Consultants:    Subjective: No complaints   Objective: Vitals:   10/31/22 0315 10/31/22 0601 10/31/22 1000 10/31/22 1042  BP: 95/68 111/81 99/85   Pulse: 73 93 (!) 103   Resp: 14 14 19    Temp:  98 F (36.7 C)  99.5 F (37.5 C)  TempSrc:  Oral  Oral  SpO2: 100% 100% 100%   Weight:      Height:        Intake/Output Summary (Last 24 hours) at 10/31/2022 1207 Last data filed at 10/30/2022 2325 Gross per 24 hour  Intake 100.43 ml  Output 250 ml  Net -149.57 ml   Filed Weights   10/30/22 1300 10/30/22 1335  Weight: 53 kg 53 kg    Examination:   General: Appearance:    Thin male in no acute distress     Lungs:     respirations unlabored  Heart:    Tachycardic. Normal rhythm. No murmurs, rubs, or gallops.    MS:   All extremities are intact.  Neurologic:   Awake, alert       Data Reviewed: I have personally reviewed following labs and imaging studies  CBC: Recent Labs  Lab 10/30/22 1310 10/30/22 1328  WBC 7.7  --   NEUTROABS 5.6  --   HGB 12.7* 15.0  HCT 39.1 44.0  MCV 86.9  --   PLT 118*  --    Basic Metabolic Panel: Recent Labs  Lab 10/30/22 1310 10/30/22 1328 2022-11-14 0605  NA 129* 131* 132*  K 3.2* 3.2* 3.9  CL 89* 90* 96*  CO2 22  --  26  GLUCOSE 254* 247* 112*  BUN 7 8 6   CREATININE 1.06 0.80 0.90  CALCIUM 9.4  --  9.0   GFR: Estimated  Creatinine Clearance: 65.4 mL/min (by C-G formula based on SCr of 0.9 mg/dL). Liver Function Tests: Recent Labs  Lab 10/30/22 1310  AST 51*  ALT 20  ALKPHOS 60  BILITOT 1.0  PROT 7.7  ALBUMIN 4.3   No results for input(s): "LIPASE", "AMYLASE" in the last 168 hours. No results for input(s): "AMMONIA" in the last 168 hours. Coagulation Profile: Recent Labs  Lab 10/30/22 1310  INR 1.1   Cardiac Enzymes: No results for input(s): "CKTOTAL", "CKMB", "CKMBINDEX", "TROPONINI" in the last 168 hours. BNP (last 3 results) No results for input(s): "PROBNP" in the last 8760 hours. HbA1C: Recent Labs    10/30/22 1554  HGBA1C 6.1*   CBG: Recent Labs  Lab 10/30/22 1310 10/30/22 1721 10/30/22 2228 11/14/22 0741  GLUCAP 251* 112* 137* 156*   Lipid Profile: No results for input(s): "CHOL", "HDL", "LDLCALC", "TRIG", "CHOLHDL", "LDLDIRECT" in the last 72 hours. Thyroid Function Tests: No results for input(s): "TSH", "T4TOTAL", "FREET4", "T3FREE", "THYROIDAB" in the last 72 hours. Anemia Panel: No results for input(s): "VITAMINB12", "FOLATE", "FERRITIN", "TIBC", "IRON", "RETICCTPCT" in the last 72 hours. Sepsis Labs: No results for input(s): "PROCALCITON", "LATICACIDVEN" in the last 168 hours.  No results found for this or any previous visit (from the past 240 hour(s)).       Radiology Studies: EEG adult  Result Date: 11-14-22 11/02/2022, MD     14-Nov-2022  7:47 AM Patient Name: CHIRON CAMPIONE MRN: [facility] Attending: [id number] Referring Physician/Provider: Charlsie Quest, MD Date: 10/30/2022 Duration: 30.08 mins Patient history: 62 year old male who presents to [facility] ED via EMS as a code stroke for language difficulty which has subsequently improved. EEG to evaluate for seizure. Level of alertness: Awake AEDs during EEG study: None Technical aspects: This EEG study was done with scalp electrodes positioned according to the 10-20 International system  of electrode placement. Electrical activity was reviewed with band pass filter of 1-70Hz , sensitivity of 7 uV/mm, display speed of 19mm/sec with a 60Hz  notched filter applied as appropriate. EEG data were recorded continuously and digitally stored.  Video monitoring was available and reviewed as appropriate. Description: The posterior dominant rhythm consists of 9 Hz activity of moderate voltage (25-35 uV) seen predominantly in posterior head regions, symmetric and reactive to eye opening and eye closing.  Physiologic photic driving was not seen during photic stimulation.  Hyperventilation was not performed.   IMPRESSION: This study is within normal limits. No seizures or epileptiform discharges were seen throughout the recording. A normal interictal EEG does not exclude the diagnosis of epilepsy. 31m   MR BRAIN WO CONTRAST  Result Date: 10/30/2022 CLINICAL DATA:  Initial evaluation for neuro deficit, stroke suspected. EXAM: MRI HEAD WITHOUT CONTRAST  TECHNIQUE: Multiplanar, multiecho pulse sequences of the brain and surrounding structures were obtained without intravenous contrast. COMPARISON:  Prior CTs from earlier the same day. FINDINGS: Brain: Examination mildly degraded by motion artifact. Cerebral volume within normal limits. No significant cerebral white matter disease for age. No evidence for acute or subacute ischemia. Gray-white matter differentiation maintained. No areas of chronic cortical infarction. No acute intracranial hemorrhage. Single punctate chronic microhemorrhage noted within the right thalamus. No mass lesion, midline shift or mass effect. No hydrocephalus or extra-axial fluid collection. Pituitary gland and suprasellar region within normal limits. Vascular: Major intracranial vascular flow voids are maintained. Skull and upper cervical spine: Craniocervical junction normal. Bone marrow signal intensity within normal limits. No scalp soft tissue abnormality. Sinuses/Orbits:  Globes orbital soft tissues demonstrate no acute finding. Remote posttraumatic defect noted at the right lamina papyracea. Scattered mucosal thickening noted about the ethmoidal air cells and maxillary sinuses. No significant mastoid effusion. Other: None. IMPRESSION: Normal brain MRI for age. No acute intracranial abnormality identified. Electronically Signed   By: Rise Mu M.D.   On: 10/30/2022 22:24   CT C-SPINE NO CHARGE  Result Date: 10/30/2022 CLINICAL DATA:  Code stroke EXAM: CT Cervical Spine with contrast TECHNIQUE: Multiplanar CT images of the cervical spine were reconstructed from contemporary CT of the Neck. RADIATION DOSE REDUCTION: This exam was performed according to the departmental dose-optimization program which includes automated exposure control, adjustment of the mA and/or kV according to patient size and/or use of iterative reconstruction technique. CONTRAST:  75 cc Omnipaque 350 COMPARISON:  CT neck 05/02/2021 FINDINGS: Alignment: There is reversal of the normal cervical lordosis centered at C5 with trace anterolisthesis of C3 on C4. There is no jumped or perched facet or other evidence of traumatic malalignment. Skull base and vertebrae: Skull base alignment is maintained. Vertebral body heights are preserved. There is no evidence of acute fracture. There is no suspicious osseous lesion. Soft tissues and spinal canal: Choose Disc levels: There is disc space narrowing degenerative endplate change most advanced at C4-C5 and C5-C6. There is overall mild multilevel facet arthropathy. There is no evidence of high-grade spinal canal stenosis. There is right worse than left neural foraminal stenosis C4-C5 and C5-C6. Upper chest: Postsurgical changes are again noted in the right upper lobe. Other: None. IMPRESSION: No acute fracture or traumatic malalignment of the cervical spine. Degenerative changes as above. Electronically Signed   By: Lesia Hausen M.D.   On: 10/30/2022 14:01    CT HEAD CODE STROKE WO CONTRAST  Result Date: 10/30/2022 CLINICAL DATA:  Code stroke. EXAM: CT ANGIOGRAPHY HEAD AND NECK TECHNIQUE: Multidetector CT imaging of the head and neck was performed using the standard protocol during bolus administration of intravenous contrast. Multiplanar CT image reconstructions and MIPs were obtained to evaluate the vascular anatomy. Carotid stenosis measurements (when applicable) are obtained utilizing NASCET criteria, using the distal internal carotid diameter as the denominator. RADIATION DOSE REDUCTION: This exam was performed according to the departmental dose-optimization program which includes automated exposure control, adjustment of the mA and/or kV according to patient size and/or use of iterative reconstruction technique. CONTRAST:  65mL OMNIPAQUE IOHEXOL 350 MG/ML SOLN COMPARISON:  CT head 08/07/2006 FINDINGS: CT HEAD FINDINGS Brain: There is no acute intracranial hemorrhage, extra-axial fluid collection, or acute territorial infarct. Parenchymal volume is normal for age. The ventricles are normal in size. Gray-white differentiation is preserved. The pituitary and suprasellar region are normal. There is no mass lesion. There is no mass effect or midline  shift. Vascular: No hyperdense vessel or unexpected calcification. Skull: Normal. Negative for fracture or focal lesion. Sinuses/Orbits: There is a remote fracture of the right lamina papyracea. The globes and orbits are otherwise unremarkable. There is mild mucosal thickening in the paranasal sinuses. ASPECTS [facility] Stroke Program Early CT Score) - Ganglionic level infarction (caudate, lentiform nuclei, internal capsule, insula, M1-M3 cortex): 7 - Supraganglionic infarction (M4-M6 cortex): 3 Total score (0-10 with 10 being normal): 10 CTA NECK FINDINGS Aortic arch: The imaged aortic arch is normal. The origins of the major branch vessels are patent. The subclavian arteries are patent to the level imaged. Right  carotid system: The right common, internal, and external carotid arteries are patent, without hemodynamically significant stenosis or occlusion. There is no dissection or aneurysm. The internal carotid artery is tortuous in the upper neck. Left carotid system: The left common, internal, and external carotid arteries are patent, without hemodynamically significant stenosis or occlusion. There is no dissection or aneurysm. The internal carotid artery is tortuous in the upper neck. Vertebral arteries: There is severe stenosis of the origin of the left vertebral artery (9-65). The remainder of the right vertebral artery is widely patent. The left vertebral artery is patent, without hemodynamically significant stenosis or occlusion. There is no dissection or aneurysm. Skeleton: There is no acute osseous abnormality or suspicious osseous lesion. There is no visible canal hematoma. 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 There is no suspicious soft tissue abnormality in the neck. Upper chest: There is right apical scarring which is favored postsurgical given interval right upper lobectomy. Subpleural scarring in the left apex is noted. Review of the MIP images confirms the above findings CTA HEAD FINDINGS Anterior circulation: The intracranial ICAs are patent The bilateral MCAs are patent, without proximal stenosis or occlusion. The bilateral ACAs are patent, without proximal stenosis or occlusion. The anterior communicating artery is normal. There is no aneurysm or AVM. Posterior circulation: The bilateral V4 segments are patent. The basilar artery is patent. The major cerebellar arteries appear patent. The bilateral PCAs are patent, without proximal stenosis or occlusion. Small right larger than left posterior communicating arteries are identified. There is no aneurysm or AVM. Venous sinuses: Suboptimally evaluated due to bolus timing. Anatomic variants: None. Review of the  MIP images confirms the above findings IMPRESSION: 1. No acute intracranial pathology.  ASPECTS is 10 2. No emergent large vessel occlusion. 3. 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 Otherwise, patent vasculature of the head and neck. Findings of the initial noncontrast head CT were paged to Dr. Amada Jupiter via Loretha Stapler at 1:25 pm. The CTA results were communicated at 1:37 pm. Electronically Signed   By: Lesia Hausen M.D.   On: 10/30/2022 13:43   CT ANGIO HEAD NECK W WO CM (CODE STROKE)  Result Date: 10/30/2022 CLINICAL DATA:  Code stroke. EXAM: CT ANGIOGRAPHY HEAD AND NECK TECHNIQUE: Multidetector CT imaging of the head and neck was performed using the standard protocol during bolus administration of intravenous contrast. Multiplanar CT image reconstructions and MIPs were obtained to evaluate the vascular anatomy. Carotid stenosis measurements (when applicable) are obtained utilizing NASCET criteria, using the distal internal carotid diameter as the denominator. RADIATION DOSE REDUCTION: This exam was performed according to the departmental dose-optimization program which includes automated exposure control, adjustment of the mA and/or kV according to patient size and/or use of iterative reconstruction technique. CONTRAST:  32mL OMNIPAQUE IOHEXOL 350 MG/ML SOLN COMPARISON:  CT head 08/07/2006 FINDINGS: CT HEAD FINDINGS Brain: There is no acute intracranial hemorrhage, extra-axial fluid collection, or acute territorial infarct. Parenchymal volume is normal for age. The ventricles are normal in size. Gray-white differentiation is preserved. The pituitary and suprasellar region are normal. There is no mass lesion. There is no mass effect or midline shift. Vascular: No hyperdense vessel or unexpected calcification. Skull: Normal. Negative for fracture or focal lesion. Sinuses/Orbits: There is a remote fracture of the right lamina papyracea. The globes and orbits are otherwise unremarkable.  There is mild mucosal thickening in the paranasal sinuses. ASPECTS [facility] Stroke Program Early CT Score) - Ganglionic level infarction (caudate, lentiform nuclei, internal capsule, insula, M1-M3 cortex): 7 - Supraganglionic infarction (M4-M6 cortex): 3 Total score (0-10 with 10 being normal): 10 CTA NECK FINDINGS Aortic arch: The imaged aortic arch is normal. The origins of the major branch vessels are patent. The subclavian arteries are patent to the level imaged. Right carotid system: The right common, internal, and external carotid arteries are patent, without hemodynamically significant stenosis or occlusion. There is no dissection or aneurysm. The internal carotid artery is tortuous in the upper neck. Left carotid system: The left common, internal, and external carotid arteries are patent, without hemodynamically significant stenosis or occlusion. There is no dissection or aneurysm. The internal carotid artery is tortuous in the upper neck. Vertebral arteries: There is severe stenosis of the origin of the left vertebral artery (9-65). The remainder of the right vertebral artery is widely patent. The left vertebral artery is patent, without hemodynamically significant stenosis or occlusion. There is no dissection or aneurysm. Skeleton: There is no acute osseous abnormality or suspicious osseous lesion. There is no visible canal hematoma. 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 There is no suspicious soft tissue abnormality in the neck. Upper chest: There is right apical scarring which is favored postsurgical given interval right upper lobectomy. Subpleural scarring in the left apex is noted. Review of the MIP images confirms the above findings CTA HEAD FINDINGS Anterior circulation: The intracranial ICAs are patent The bilateral MCAs are patent, without proximal stenosis or occlusion. The bilateral ACAs are patent, without proximal stenosis or occlusion. The  anterior communicating artery is normal. There is no aneurysm or AVM. Posterior circulation: The bilateral V4 segments are patent. The basilar artery is patent. The major cerebellar arteries appear patent. The bilateral PCAs are patent, without proximal stenosis or occlusion. Small right larger than left posterior communicating arteries are identified. There is no aneurysm or AVM. Venous sinuses: Suboptimally evaluated due to bolus timing. Anatomic variants: None. Review of the MIP images confirms the above findings IMPRESSION: 1. No acute intracranial pathology.  ASPECTS is 10 2. No emergent large vessel occlusion. 3. 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 Otherwise, patent vasculature of the head and neck. Findings of the initial noncontrast head CT were paged to Dr. Amada Jupiter via Loretha Stapler at 1:25 pm. The CTA results were communicated at 1:37 pm. Electronically Signed   By: Lesia Hausen M.D.   On: 10/30/2022 13:43        Scheduled Meds:  enoxaparin (LOVENOX) injection  40 mg Subcutaneous Q24H   feeding supplement  237 mL Oral BID BM   folic acid  1 mg Oral Daily   insulin aspart  0-15 Units Subcutaneous TID WC   levothyroxine  50 mcg Oral Q0600   multivitamin with minerals  1 tablet Oral Daily  Continuous Infusions:  thiamine (VITAMIN B1) injection Stopped (10/30/22 2325)     LOS: 1 day    Time spent: 45 minutes spent on chart review, discussion with nursing staff, consultants, updating family and interview/physical exam; more than 50% of that time was spent in counseling and/or coordination of care.    Joseph Art, DO [facility] Available via Epic secure chat 7am-7pm After these hours, please refer to coverage provider listed on [email or web address] 10/31/2022, 12:07 PM

## 2022-10-31 NOTE — ED Notes (Signed)
Patient awake and alert, AOX4, no s/s of distress, is walking around in the hallway, he would like to shower, advised we currently do not have an area for him to shower down here, but possibly upstairs depending on his orders.

## 2022-10-31 NOTE — Plan of Care (Signed)
  Problem: Coping: Goal: Ability to adjust to condition or change in health will improve Outcome: Progressing   Problem: Fluid Volume: Goal: Ability to maintain a balanced intake and output will improve Outcome: Progressing   Problem: Health Behavior/Discharge Planning: Goal: Ability to manage health-related needs will improve Outcome: Progressing   Problem: Metabolic: Goal: Ability to maintain appropriate glucose levels will improve Outcome: Progressing   Problem: Nutritional: Goal: Maintenance of adequate nutrition will improve Outcome: Progressing   Problem: Education: Goal: Knowledge of General Education information will improve Description: Including pain rating scale, medication(s)/side effects and non-pharmacologic comfort measures Outcome: Progressing

## 2022-10-31 NOTE — ED Notes (Signed)
Patient left the floor with staff and his belongings to 3W.

## 2022-10-31 NOTE — Care Management (Signed)
SA resources on AVS

## 2022-10-31 NOTE — ED Notes (Signed)
trevaun rendleman (brother) of ezekiel menzer would like to know status of Juno. Can be called back @ [phone].

## 2022-10-31 NOTE — ED Notes (Signed)
Patient awake and alert this morning, able to verbalize his needs, currently sitting up in bed eating, no s/s of distress, will continue to monitor.

## 2022-11-01 DIAGNOSIS — G9340 Encephalopathy, unspecified: Secondary | ICD-10-CM | POA: Diagnosis not present

## 2022-11-01 LAB — BASIC METABOLIC PANEL
Anion gap: 8 (ref 5–15)
BUN: 8 mg/dL (ref 6–20)
CO2: 27 mmol/L (ref 22–32)
Calcium: 9.3 mg/dL (ref 8.9–10.3)
Chloride: 96 mmol/L — ABNORMAL LOW (ref 98–111)
Creatinine, Ser: 0.87 mg/dL (ref 0.61–1.24)
GFR, Estimated: >60 mL/min (ref >60)
Glucose, Bld: 98 mg/dL (ref 70–99)
Potassium: 3.8 mmol/L (ref 3.5–5.1)
Sodium: 131 mmol/L — ABNORMAL LOW (ref 135–145)

## 2022-11-01 LAB — CBC
HCT: 32.7 % — ABNORMAL LOW (ref 39.0–52.0)
Hemoglobin: 11.3 g/dL — ABNORMAL LOW (ref 13.0–17.0)
MCH: 29.7 pg (ref 26.0–34.0)
MCHC: 34.6 g/dL (ref 30.0–36.0)
MCV: 86.1 fL (ref 80.0–100.0)
Platelets: 102 10*3/uL — ABNORMAL LOW (ref 150–400)
RBC: 3.8 MIL/uL — ABNORMAL LOW (ref 4.22–5.81)
RDW: 15.4 % (ref 11.5–15.5)
WBC: 4.6 10*3/uL (ref 4.0–10.5)
nRBC: 0 % (ref 0.0–0.2)

## 2022-11-01 LAB — GLUCOSE, CAPILLARY
Glucose-Capillary: 139 mg/dL — ABNORMAL HIGH (ref 70–99)
Glucose-Capillary: 119 mg/dL — ABNORMAL HIGH (ref 70–99)
Glucose-Capillary: 120 mg/dL — ABNORMAL HIGH (ref 70–99)

## 2022-11-01 MED ORDER — ENSURE ENLIVE PO LIQD
237.0000 mL | Freq: Three times a day (TID) | ORAL | Status: DC
  Administered 2022-11-01 – 2022-11-02 (×2): 237 mL via ORAL

## 2022-11-01 MED ORDER — THIAMINE HCL 100 MG/ML IJ SOLN
500.0000 mg | INTRAVENOUS | Status: DC
  Administered 2022-11-01 – 2022-11-02 (×2): 500 mg via INTRAVENOUS
  Filled 2022-11-01 (×2): qty 5

## 2022-11-01 MED ORDER — SODIUM CHLORIDE 0.9 % IV SOLN: INTRAVENOUS | Status: DC

## 2022-11-01 NOTE — Progress Notes (Signed)
PROGRESS NOTE    [name]  TNI:[id number] DOB: 18-May-1961 DOA: 10/30/2022 PCP: Fleet Contras, MD    Brief Narrative:  [name] is a 62 y.o. 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   Patient remains a heavy drinker he drinks at least half pint of liquor plus various amount of beer every day, last drink was yesterday evening.  He was last seen normal this morning around 11 and the same friend visited him around lunchtime and found him on the floor crawling.  Patient reported that he felt lightheadedness when standing up this morning " and blacked out".    Found to be orthostatic.  Work up Programmer, systems and Plan: Syncope- vasovagal vs orthostatic -Probably related to alcohol abuse dehydration and acute Warnicke encephalopathy -IVF and labs-- recheck orthos in the AM -Treat Warnicke's encephalopathy as below -EEG negative -MRI negative   Warnicke's encephalopathy -With significant bilateral lower extremity ataxia -IV thiamine high dose   Hypokalemia -replaced   Hyperglycemia -A1C: 6.1 -Cover with sliding scale   Alcohol abuse -As above   History of tonsillar CA status post chemoradiation -No active issue, outpatient follow-up with oncology   Moderate protein calorie malnutrition -BMI 18, likely related to alcohol abuse -Start protein supplement.    DVT prophylaxis: enoxaparin (LOVENOX) injection 40 mg Start: 10/30/22 2200    Code Status: Full Code Family Communication: none at bedside  Disposition Plan:  Level of care: Telemetry Medical Status is: Inpatient Remains inpatient appropriate because: home once orthostatics negative    Consultants:  neuro  Subjective: Willing to leave in IV for IVF   Objective: Vitals:   10/31/22 2235 11/01/22 0016 11/01/22 0315 11/01/22 0735  BP:   100/65 100/71 117/63  Pulse:  70 65 68  Resp:  16 16 18   Temp:  97.9 F (36.6 C) 98.8 F (37.1 C) 98.6 F (37 C)  TempSrc:  Oral Oral Oral  SpO2: (S) 100% 90% 100% 100%  Weight:      Height:        Intake/Output Summary (Last 24 hours) at 11/01/2022 1058 Last data filed at 11/01/2022 0600 Gross per 24 hour  Intake 290 ml  Output 550 ml  Net -260 ml   Filed Weights   10/30/22 1300 10/30/22 1335  Weight: 53 kg 53 kg    Examination:    General: Appearance:    Thin male in no acute distress     Lungs:      respirations unlabored  Heart:    Normal heart rate.   MS:   All extremities are intact.   Neurologic:   Awake, alert         Data Reviewed: I have personally reviewed following labs and imaging studies  CBC: Recent Labs  Lab 10/30/22 1310 10/30/22 1328  WBC 7.7  --   NEUTROABS 5.6  --   HGB 12.7* 15.0  HCT 39.1 44.0  MCV 86.9  --   PLT 118*  --    Basic Metabolic Panel: Recent Labs  Lab 10/30/22 1310 10/30/22 1328 10/31/22 0605  NA 129* 131* 132*  K 3.2* 3.2* 3.9  CL 89* 90* 96*  CO2 22  --  26  GLUCOSE 254* 247* 112*  BUN 7 8 6   CREATININE 1.06 0.80 0.90  CALCIUM 9.4  --  9.0   GFR: Estimated  Creatinine Clearance: 65.4 mL/min (by C-G formula based on SCr of 0.9 mg/dL). Liver Function Tests: Recent Labs  Lab 10/30/22 1310  AST 51*  ALT 20  ALKPHOS 60  BILITOT 1.0  PROT 7.7  ALBUMIN 4.3   No results for input(s): "LIPASE", "AMYLASE" in the last 168 hours. No results for input(s): "AMMONIA" in the last 168 hours. Coagulation Profile: Recent Labs  Lab 10/30/22 1310  INR 1.1   Cardiac Enzymes: No results for input(s): "CKTOTAL", "CKMB", "CKMBINDEX", "TROPONINI" in the last 168 hours. BNP (last 3 results) No results for input(s): "PROBNP" in the last 8760 hours. HbA1C: Recent Labs    10/30/22 1554  HGBA1C 6.1*   CBG: Recent Labs  Lab 11-14-22 0741 11-14-2022 1239 11-14-22 1602 11/14/22 2312 11/01/22 0625  GLUCAP 156*  169* 105* 127* 139*   Lipid Profile: No results for input(s): "CHOL", "HDL", "LDLCALC", "TRIG", "CHOLHDL", "LDLDIRECT" in the last 72 hours. Thyroid Function Tests: No results for input(s): "TSH", "T4TOTAL", "FREET4", "T3FREE", "THYROIDAB" in the last 72 hours. Anemia Panel: No results for input(s): "VITAMINB12", "FOLATE", "FERRITIN", "TIBC", "IRON", "RETICCTPCT" in the last 72 hours. Sepsis Labs: No results for input(s): "PROCALCITON", "LATICACIDVEN" in the last 168 hours.  No results found for this or any previous visit (from the past 240 hour(s)).       Radiology Studies: EEG adult  Result Date: 11-14-2022 Lora Havens, MD     11/14/22  7:47 AM Patient Name: [name] MRN: [id number] Epilepsy Attending: Lora Havens Referring Physician/Provider: Greta Doom, MD Date: 10/30/2022 Duration: 30.08 mins Patient history: 62 year old male who [facility] ED via EMS as a code stroke for language difficulty which has subsequently improved. EEG to evaluate for seizure. Level of alertness: Awake AEDs during EEG study: None Technical aspects: This EEG study was done with scalp electrodes positioned according to the 10-20 International system of electrode placement. Electrical activity was reviewed with band pass filter of 1-70Hz , sensitivity of 7 uV/mm, display speed of 25mm/sec with a 60Hz  notched filter applied as appropriate. EEG data were recorded continuously and digitally stored.  Video monitoring was available and reviewed as appropriate. Description: The posterior dominant rhythm consists of 9 Hz activity of moderate voltage (25-35 uV) seen predominantly in posterior head regions, symmetric and reactive to eye opening and eye closing.  Physiologic photic driving was not seen during photic stimulation.  Hyperventilation was not performed.   IMPRESSION: This study is within normal limits. No seizures or epileptiform discharges were seen throughout the recording. A normal  interictal EEG does not exclude the diagnosis of epilepsy. Lora Havens   MR BRAIN WO CONTRAST  Result Date: 10/30/2022 CLINICAL DATA:  Initial evaluation for neuro deficit, stroke suspected. EXAM: MRI HEAD WITHOUT CONTRAST TECHNIQUE: Multiplanar, multiecho pulse sequences of the brain and surrounding structures were obtained without intravenous contrast. COMPARISON:  Prior CTs from earlier the same day. FINDINGS: Brain: Examination mildly degraded by motion artifact. Cerebral volume within normal limits. No significant cerebral white matter disease for age. No evidence for acute or subacute ischemia. Gray-white matter differentiation maintained. No areas of chronic cortical infarction. No acute intracranial hemorrhage. Single punctate chronic microhemorrhage noted within the right thalamus. No mass lesion, midline shift or mass effect. No hydrocephalus or extra-axial fluid collection. Pituitary gland and suprasellar region within normal limits. Vascular: Major intracranial vascular flow voids are maintained. Skull and upper cervical spine: Craniocervical junction normal. Bone marrow signal intensity within normal limits. No scalp soft  tissue abnormality. Sinuses/Orbits: Globes orbital soft tissues demonstrate no acute finding. Remote posttraumatic defect noted at the right lamina papyracea. Scattered mucosal thickening noted about the ethmoidal air cells and maxillary sinuses. No significant mastoid effusion. Other: None. IMPRESSION: Normal brain MRI for age. No acute intracranial abnormality identified. Electronically Signed   By: Rise Mu M.D.   On: 10/30/2022 22:24   CT C-SPINE NO CHARGE  Result Date: 10/30/2022 CLINICAL DATA:  Code stroke EXAM: CT Cervical Spine with contrast TECHNIQUE: Multiplanar CT images of the cervical spine were reconstructed from contemporary CT of the Neck. RADIATION DOSE REDUCTION: This exam was performed according to the departmental dose-optimization program  which includes automated exposure control, adjustment of the mA and/or kV according to patient size and/or use of iterative reconstruction technique. CONTRAST:  75 cc Omnipaque 350 COMPARISON:  CT neck 05/02/2021 FINDINGS: Alignment: There is reversal of the normal cervical lordosis centered at C5 with trace anterolisthesis of C3 on C4. There is no jumped or perched facet or other evidence of traumatic malalignment. Skull base and vertebrae: Skull base alignment is maintained. Vertebral body heights are preserved. There is no evidence of acute fracture. There is no suspicious osseous lesion. Soft tissues and spinal canal: Choose Disc levels: There is disc space narrowing degenerative endplate change most advanced at C4-C5 and C5-C6. There is overall mild multilevel facet arthropathy. There is no evidence of high-grade spinal canal stenosis. There is right worse than left neural foraminal stenosis C4-C5 and C5-C6. Upper chest: Postsurgical changes are again noted in the right upper lobe. Other: None. IMPRESSION: No acute fracture or traumatic malalignment of the cervical spine. Degenerative changes as above. Electronically Signed   By: Lesia Hausen M.D.   On: 10/30/2022 14:01   CT HEAD CODE STROKE WO CONTRAST  Result Date: 10/30/2022 CLINICAL DATA:  Code stroke. EXAM: CT ANGIOGRAPHY HEAD AND NECK TECHNIQUE: Multidetector CT imaging of the head and neck was performed using the standard protocol during bolus administration of intravenous contrast. Multiplanar CT image reconstructions and MIPs were obtained to evaluate the vascular anatomy. Carotid stenosis measurements (when applicable) are obtained utilizing NASCET criteria, using the distal internal carotid diameter as the denominator. RADIATION DOSE REDUCTION: This exam was performed according to the departmental dose-optimization program which includes automated exposure control, adjustment of the mA and/or kV according to patient size and/or use of iterative  reconstruction technique. CONTRAST:  12mL OMNIPAQUE IOHEXOL 350 MG/ML SOLN COMPARISON:  CT head 08/07/2006 FINDINGS: CT HEAD FINDINGS Brain: There is no acute intracranial hemorrhage, extra-axial fluid collection, or acute territorial infarct. Parenchymal volume is normal for age. The ventricles are normal in size. Gray-white differentiation is preserved. The pituitary and suprasellar region are normal. There is no mass lesion. There is no mass effect or midline shift. Vascular: No hyperdense vessel or unexpected calcification. Skull: Normal. Negative for fracture or focal lesion. Sinuses/Orbits: There is a remote fracture of the right lamina papyracea. The globes and orbits are otherwise unremarkable. There is mild mucosal thickening in the paranasal sinuses. ASPECTS [facility] Stroke Program Early CT Score) - Ganglionic level infarction (caudate, lentiform nuclei, internal capsule, insula, M1-M3 cortex): 7 - Supraganglionic infarction (M4-M6 cortex): 3 Total score (0-10 with 10 being normal): 10 CTA NECK FINDINGS Aortic arch: The imaged aortic arch is normal. The origins of the major branch vessels are patent. The subclavian arteries are patent to the level imaged. Right carotid system: The right common, internal, and external carotid arteries are patent, without hemodynamically significant stenosis or  occlusion. There is no dissection or aneurysm. The internal carotid artery is tortuous in the upper neck. Left carotid system: The left common, internal, and external carotid arteries are patent, without hemodynamically significant stenosis or occlusion. There is no dissection or aneurysm. The internal carotid artery is tortuous in the upper neck. Vertebral arteries: There is severe stenosis of the origin of the left vertebral artery (9-65). The remainder of the right vertebral artery is widely patent. The left vertebral artery is patent, without hemodynamically significant stenosis or occlusion. There is no  dissection or aneurysm. Skeleton: There is no acute osseous abnormality or suspicious osseous lesion. There is no visible canal hematoma. 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 There is no suspicious soft tissue abnormality in the neck. Upper chest: There is right apical scarring which is favored postsurgical given interval right upper lobectomy. Subpleural scarring in the left apex is noted. Review of the MIP images confirms the above findings CTA HEAD FINDINGS Anterior circulation: The intracranial ICAs are patent The bilateral MCAs are patent, without proximal stenosis or occlusion. The bilateral ACAs are patent, without proximal stenosis or occlusion. The anterior communicating artery is normal. There is no aneurysm or AVM. Posterior circulation: The bilateral V4 segments are patent. The basilar artery is patent. The major cerebellar arteries appear patent. The bilateral PCAs are patent, without proximal stenosis or occlusion. Small right larger than left posterior communicating arteries are identified. There is no aneurysm or AVM. Venous sinuses: Suboptimally evaluated due to bolus timing. Anatomic variants: None. Review of the MIP images confirms the above findings IMPRESSION: 1. No acute intracranial pathology.  ASPECTS is 10 2. No emergent large vessel occlusion. 3. 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 Otherwise, patent vasculature of the head and neck. Findings of the initial noncontrast head CT were paged to Dr. Amada Jupiter via Loretha Stapler at 1:25 pm. The CTA results were communicated at 1:37 pm. Electronically Signed   By: Lesia Hausen M.D.   On: 10/30/2022 13:43   CT ANGIO HEAD NECK W WO CM (CODE STROKE)  Result Date: 10/30/2022 CLINICAL DATA:  Code stroke. EXAM: CT ANGIOGRAPHY HEAD AND NECK TECHNIQUE: Multidetector CT imaging of the head and neck was performed using the standard protocol during bolus administration of intravenous  contrast. Multiplanar CT image reconstructions and MIPs were obtained to evaluate the vascular anatomy. Carotid stenosis measurements (when applicable) are obtained utilizing NASCET criteria, using the distal internal carotid diameter as the denominator. RADIATION DOSE REDUCTION: This exam was performed according to the departmental dose-optimization program which includes automated exposure control, adjustment of the mA and/or kV according to patient size and/or use of iterative reconstruction technique. CONTRAST:  61mL OMNIPAQUE IOHEXOL 350 MG/ML SOLN COMPARISON:  CT head 08/07/2006 FINDINGS: CT HEAD FINDINGS Brain: There is no acute intracranial hemorrhage, extra-axial fluid collection, or acute territorial infarct. Parenchymal volume is normal for age. The ventricles are normal in size. Gray-white differentiation is preserved. The pituitary and suprasellar region are normal. There is no mass lesion. There is no mass effect or midline shift. Vascular: No hyperdense vessel or unexpected calcification. Skull: Normal. Negative for fracture or focal lesion. Sinuses/Orbits: There is a remote fracture of the right lamina papyracea. The globes and orbits are otherwise unremarkable. There is mild mucosal thickening in the paranasal sinuses. ASPECTS [facility] Stroke Program Early CT Score) - Ganglionic level infarction (caudate, lentiform nuclei, internal capsule, insula, M1-M3 cortex): 7 - Supraganglionic infarction (M4-M6  cortex): 3 Total score (0-10 with 10 being normal): 10 CTA NECK FINDINGS Aortic arch: The imaged aortic arch is normal. The origins of the major branch vessels are patent. The subclavian arteries are patent to the level imaged. Right carotid system: The right common, internal, and external carotid arteries are patent, without hemodynamically significant stenosis or occlusion. There is no dissection or aneurysm. The internal carotid artery is tortuous in the upper neck. Left carotid system: The left  common, internal, and external carotid arteries are patent, without hemodynamically significant stenosis or occlusion. There is no dissection or aneurysm. The internal carotid artery is tortuous in the upper neck. Vertebral arteries: There is severe stenosis of the origin of the left vertebral artery (9-65). The remainder of the right vertebral artery is widely patent. The left vertebral artery is patent, without hemodynamically significant stenosis or occlusion. There is no dissection or aneurysm. Skeleton: There is no acute osseous abnormality or suspicious osseous lesion. There is no visible canal hematoma. 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 There is no suspicious soft tissue abnormality in the neck. Upper chest: There is right apical scarring which is favored postsurgical given interval right upper lobectomy. Subpleural scarring in the left apex is noted. Review of the MIP images confirms the above findings CTA HEAD FINDINGS Anterior circulation: The intracranial ICAs are patent The bilateral MCAs are patent, without proximal stenosis or occlusion. The bilateral ACAs are patent, without proximal stenosis or occlusion. The anterior communicating artery is normal. There is no aneurysm or AVM. Posterior circulation: The bilateral V4 segments are patent. The basilar artery is patent. The major cerebellar arteries appear patent. The bilateral PCAs are patent, without proximal stenosis or occlusion. Small right larger than left posterior communicating arteries are identified. There is no aneurysm or AVM. Venous sinuses: Suboptimally evaluated due to bolus timing. Anatomic variants: None. Review of the MIP images confirms the above findings IMPRESSION: 1. No acute intracranial pathology.  ASPECTS is 10 2. No emergent large vessel occlusion. 3. 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 Otherwise, patent vasculature of the head and neck. Findings  of the initial noncontrast head CT were paged to Dr. Amada Jupiter via Loretha Stapler at 1:25 pm. The CTA results were communicated at 1:37 pm. Electronically Signed   By: Lesia Hausen M.D.   On: 10/30/2022 13:43        Scheduled Meds:  enoxaparin (LOVENOX) injection  40 mg Subcutaneous Q24H   feeding supplement  237 mL Oral BID BM   folic acid  1 mg Oral Daily   insulin aspart  0-15 Units Subcutaneous TID WC   levothyroxine  50 mcg Oral Q0600   multivitamin with minerals  1 tablet Oral Daily   Continuous Infusions:  sodium chloride     thiamine (VITAMIN B1) injection       LOS: 2 days    Time spent: 45 minutes spent on chart review, discussion with nursing staff, consultants, updating family and interview/physical exam; more than 50% of that time was spent in counseling and/or coordination of care.    Joseph Art, DO [facility] Available via Epic secure chat 7am-7pm After these hours, please refer to coverage provider listed on [email or web address] 11/01/2022, 10:58 AM

## 2022-11-01 NOTE — Evaluation (Addendum)
Physical Therapy Evaluation Patient Details Name: [name] MRN: [id number] DOB: [birth year] Today's Date: 11/01/2022  History of Present Illness  [name] is a 62 y.o. who was admitted after being found on the floor by friend this afternoon. 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 Pt with noted decreased insight to safety however suspect this to be his baseline level of function. Pt with some dizziness and lateral sway with ambulation however I suspect this would improve once hypotensive BP is under control. Pt reports having a son close by that can check on him. Recommending no PT follow up at this time as I suspect once BP under control he will progress well and demo safe ambulation without AD as he was PTA. Acute PT to cont to follow.       Recommendations for follow up therapy are one component of a multi-disciplinary discharge planning process, led by the attending physician.  Recommendations may be updated based on patient status, additional functional criteria and insurance authorization.  Follow Up Recommendations No PT follow up      Assistance Recommended at Discharge Intermittent Supervision/Assistance  Patient can return home with the following  Assist for transportation;Help with stairs or ramp for entrance    Equipment Recommendations None recommended by PT  Recommendations for Other Services       Functional Status Assessment Patient has had a recent decline in their functional status and demonstrates the ability to make significant improvements in function in a reasonable and predictable amount of time.     Precautions / Restrictions Precautions Precautions: Fall Precaution Comments: soft BP, 81/55, increased to 90s/60s with a Map of 75 s/p ambulation. Restrictions Weight Bearing Restrictions: No       Mobility  Bed Mobility Overal bed mobility: Modified Independent             General bed mobility comments: HOB flat, no need for bed rail    Transfers Overall transfer level: Needs assistance Equipment used: None Transfers: Sit to/from Stand Sit to Stand: Min guard           General transfer comment: min guard due to known orthostatic hypotension    Ambulation/Gait Ambulation/Gait assistance: Min guard Gait Distance (Feet): 200 Feet Assistive device: None Gait Pattern/deviations: Step-through pattern, Decreased stride length, Drifts right/left Gait velocity: dec Gait velocity interpretation: 1.31 - 2.62 ft/sec, indicative of limited community ambulator   General Gait Details: pt with report of mild dizziness however BP incresased to 90/60s. pt with some lateral sway due to dizziness  Stairs            Wheelchair Mobility    Modified Rankin (Stroke Patients Only) Modified Rankin (Stroke Patients Only) Pre-Morbid Rankin Score: No significant disability Modified Rankin: Moderate disability     Balance Overall balance assessment: Mild deficits observed, not formally tested (pt stood at sink to wash face without difficulty, BP 81/55)                                           Pertinent Vitals/Pain Pain Assessment Pain Assessment: No/denies pain    Home Living Family/patient expects to be discharged to:: Private residence Living Arrangements: Alone   Type of Home: House Home Access: Stairs to enter  Entrance Stairs-Rails: Right;Left Entrance Stairs-Number of Steps: 3   Home Layout: One level Home Equipment: Grab bars - toilet;Grab bars - tub/shower;None Additional Comments: walking stick    Prior Function Prior Level of Function : Independent/Modified Independent             Mobility Comments: rides a bike to get around, takes care of his dog ADLs Comments: indep     Hand Dominance   Dominant Hand: Right     Extremity/Trunk Assessment   Upper Extremity Assessment Upper Extremity Assessment: Defer to OT evaluation    Lower Extremity Assessment Lower Extremity Assessment: Generalized weakness, able to complete heel to chin test bilat    Cervical / Trunk Assessment Cervical / Trunk Assessment: Normal  Communication   Communication: No difficulties  Cognition Arousal/Alertness: Awake/alert Behavior During Therapy: WFL for tasks assessed/performed Overall Cognitive Status: No family/caregiver present to determine baseline cognitive functioning                                 General Comments: appears close to base line, able to express needs, alert and oriented except for day (pt re-oriented)        General Comments General comments (skin integrity, edema, etc.): noted soft BP however stayed the same t/o position changes and even increased s/p walking    Exercises     Assessment/Plan    PT Assessment Patient needs continued PT services  PT Problem List Decreased strength;Decreased activity tolerance;Decreased balance;Decreased mobility;Decreased cognition;Decreased safety awareness       PT Treatment Interventions DME instruction;Gait training;Stair training;Functional mobility training;Therapeutic activities;Therapeutic exercise    PT Goals (Current goals can be found in the Care Plan section)  Acute Rehab PT Goals Patient Stated Goal: to get in the shower, go home PT Goal Formulation: With patient Potential to Achieve Goals: Good    Frequency Min 2X/week     Co-evaluation               AM-PAC PT "6 Clicks" Mobility  Outcome Measure Help needed turning from your back to your side while in a flat bed without using bedrails?: None Help needed moving from lying on your back to sitting on the side of a flat bed without using bedrails?: None Help needed moving to and from a bed to a chair (including a wheelchair)?: A Little Help needed standing up from a  chair using your arms (e.g., wheelchair or bedside chair)?: A Little Help needed to walk in hospital room?: A Little Help needed climbing 3-5 steps with a railing? : A Little 6 Click Score: 20    End of Session Equipment Utilized During Treatment: Gait belt Activity Tolerance: Patient tolerated treatment well Patient left: in chair;with chair alarm set;with call bell/phone within reach   PT Visit Diagnosis: Unsteadiness on feet (R26.81);Muscle weakness (generalized) (M62.81);Difficulty in walking, not elsewhere classified (R26.2)    Time: 0659-9087 PT Time Calculation (min) (ACUTE ONLY): 12 min   Charges:   PT Evaluation $PT Eval Low Complexity: 1 Low          Lewis Shock, PT, DPT Acute Rehabilitation Services Secure chat preferred Office #: [phone]   Iona Hansen 11/01/2022, 10:12 AM

## 2022-11-01 NOTE — Progress Notes (Signed)
Initial Nutrition Assessment  DOCUMENTATION CODES:   Underweight  INTERVENTION:  - increased Ensure Plus High Protein to TID, each supplement provides 350 kcal and 20 grams of protein.  - ordered double protein portions with all meals.  - complete NFPE when feasible.  - communicated with RN via secure chat about Calorie Count; RD will follow-up 1/15 and 1/16 with results.   NUTRITION DIAGNOSIS:   Increased nutrient needs related to acute illness as evidenced by estimated needs.  GOAL:   Patient will meet greater than or equal to 90% of their needs  MONITOR:   PO intake, Supplement acceptance, Labs, Weight trends  REASON FOR ASSESSMENT:   Consult Calorie Count  ASSESSMENT:   62 y.o. male with medical history of stage III left tonsillar carcinoma s/p chemoradiation in 2018-2019, alcohol abuse, multiple OA's, arthritis. He presented to the ED after being found on the floor by his friend. Patient drinks at least a half pint of liquor plus various amount of beer every day. His last drink was the evening PTA. Patient reported to his friend that he was lightheaded earlier in the day and blacked out.  No meal intake percentages documented in the flow sheet. Noted in [facility] that patient has missed several meals since advancement from NPO to Regular on 1/12 afternoon.   Patient is noted to be a/o to self and time.   He has not been assessed by a [place] RD since 11/2017.   Weight on 1/12 was 117 lb and weight on 04/30/22 was UBW of 119 lb. No information documented in the edema section of flow sheet.   Labs reviewed; CBG: 139 mg/dl, Na: 401 mmol/l, Cl: 96 mmol/l.  Medications reviewed; 1 mg folvite/day, sliding scale novolog, 50 mcg oral levothyroxine/day, 1 tablet multivitamin with minerals/day, 500 mg IV thiamine/day x5 days starting 1/14.  IVF; NS @ 100 ml/hr.    NUTRITION - FOCUSED PHYSICAL EXAM:  RD working remotely.  Diet Order:   Diet Order              Diet regular Room service appropriate? Yes; Fluid consistency: Thin  Diet effective now                   EDUCATION NEEDS:   Not appropriate for education at this time  Skin:  Skin Assessment: Reviewed RN Assessment  Last BM:  PTA/unknown  Height:   Ht Readings from Last 1 Encounters:  10/30/22 5\' 7"  (1.702 m)    Weight:   Wt Readings from Last 1 Encounters:  10/30/22 53 kg    BMI:  Body mass index is 18.3 kg/m.  Estimated Nutritional Needs:  Kcal:  1700-2000 kcal Protein:  85-100 grams Fluid:  >/= 2.2 L/day     12/29/22, MS, RD, LDN, CNSC Clinical Dietitian PRN/Relief staff On-call/weekend pager # available in [facility]

## 2022-11-01 NOTE — Evaluation (Signed)
Occupational Therapy Evaluation Patient Details Name: [name] MRN: [id number] DOB: 14-Mar-1961 Today's Date: 11/01/2022   History of Present Illness [name] is a 62 y.o. who was admitted after being found on the floor by friend this afternoon. 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 On evaluation he demonstrates functional ROM, strength and coordination of upper extremities. He demonstrates ability to perform all ADLs without physical assistance. He was provided min guard for safety due to monitoring of orthostatic hypotension.  See flow sheet for details -- Bps in low 80s without patient being symptomatic. He did ambulate in hall with a slight increase in BP to 98/69 and then complained of dizziness. From a functional standpoint - patient is able to perform ADLs and functional mobility without physical assistance -- though is a fall risk due to BP. Cognitively he is able to follow commands and acts appropriately. He appears to be at his baseline. Managing and monitoring his blood pressure is only concern in regards to discharge. He reports he has a son that may be able to assist him at discharge.      Recommendations for follow up therapy are one component of a multi-disciplinary discharge planning process, led by the attending physician.  Recommendations may be updated based on patient status, additional functional criteria and insurance authorization.   Follow Up Recommendations  No OT follow up     Assistance Recommended at Discharge Intermittent Supervision/Assistance  Patient can return home with the following Direct supervision/assist for financial management;Direct supervision/assist for medications management    Functional Status Assessment  Patient  has not had a recent decline in their functional status  Equipment Recommendations  None recommended by OT    Recommendations for Other Services       Precautions / Restrictions Precautions Precautions: Fall Precaution Comments: soft BP, 81/55, increased to 90s/60s with a Map of 75 s/p ambulation. Restrictions Weight Bearing Restrictions: No      Mobility Bed Mobility Overal bed mobility: Modified Independent                  Transfers Overall transfer level: Needs assistance Equipment used: None Transfers: Sit to/from Stand Sit to Stand: Min guard           General transfer comment: min guard due to known orthostatic hypotension      Balance Overall balance assessment: Mild deficits observed, not formally tested                                         ADL either performed or assessed with clinical judgement   ADL Overall ADL's : At baseline                                       General ADL Comments: No physical assistance needed for ADLs. Only min guard with standing during ADLs due to low BP and monitoring for dizziness.     Vision   Vision Assessment?: No apparent visual deficits     Perception     Praxis      Pertinent Vitals/Pain Pain  Assessment Pain Assessment: No/denies pain     Hand Dominance Right   Extremity/Trunk Assessment Upper Extremity Assessment Upper Extremity Assessment: Overall WFL for tasks assessed   Lower Extremity Assessment Lower Extremity Assessment: Defer to PT evaluation   Cervical / Trunk Assessment Cervical / Trunk Assessment: Normal   Communication Communication Communication: No difficulties   Cognition Arousal/Alertness: Awake/alert Behavior During Therapy: WFL for tasks assessed/performed Overall Cognitive Status: No family/caregiver present to determine baseline cognitive functioning                                 General Comments: appears close to  base line, able to express needs, alert and oriented except for day (pt re-oriented)     General Comments  noted soft BP however stayed the same t/o position changes and even increased s/p walking    Exercises     Shoulder Instructions      Home Living Family/patient expects to be discharged to:: Private residence Living Arrangements: Alone   Type of Home: House Home Access: Stairs to enter Secretary/administrator of Steps: 3 Entrance Stairs-Rails: Right;Left Home Layout: One level     Bathroom Shower/Tub: Chief Strategy Officer: Standard     Home Equipment: Grab bars - toilet;Grab bars - tub/shower;None   Additional Comments: walking stick      Prior Functioning/Environment Prior Level of Function : Independent/Modified Independent             Mobility Comments: rides a bike to get around, takes care of his dog ADLs Comments: indep        OT Problem List: Cardiopulmonary status limiting activity      OT Treatment/Interventions:      OT Goals(Current goals can be found in the care plan section) Acute Rehab OT Goals OT Goal Formulation: All assessment and education complete, DC therapy  OT Frequency:      Co-evaluation              AM-PAC OT "6 Clicks" Daily Activity     Outcome Measure Help from another person eating meals?: None Help from another person taking care of personal grooming?: None Help from another person toileting, which includes using toliet, bedpan, or urinal?: None Help from another person bathing (including washing, rinsing, drying)?: None Help from another person to put on and taking off regular upper body clothing?: None Help from another person to put on and taking off regular lower body clothing?: None 6 Click Score: 24   End of Session Equipment Utilized During Treatment: Gait belt Nurse Communication: Mobility status  Activity Tolerance: Patient tolerated treatment well Patient left: in chair;with call  bell/phone within reach;with chair alarm set  OT Visit Diagnosis: Dizziness and giddiness (R42)                Time: 2658-7184 OT Time Calculation (min): 10 min Charges:  OT General Charges $OT Visit: 1 Visit OT Evaluation $OT Eval Low Complexity: 1 Low  Donnella Sham, OTR/L Acute Care Rehab Services  Office [phone]   Kelli Churn 11/01/2022, 10:35 AM

## 2022-11-02 ENCOUNTER — Inpatient Hospital Stay: Payer: Medicaid Other | Admitting: Internal Medicine

## 2022-11-02 DIAGNOSIS — G9340 Encephalopathy, unspecified: Secondary | ICD-10-CM | POA: Diagnosis not present

## 2022-11-02 LAB — T4, FREE: Free T4: 0.52 ng/dL — ABNORMAL LOW (ref 0.61–1.12)

## 2022-11-02 LAB — CORTISOL-AM, BLOOD: Cortisol - AM: 13.2 ug/dL (ref 6.7–22.6)

## 2022-11-02 LAB — TSH: TSH: 65.873 u[IU]/mL — ABNORMAL HIGH (ref 0.350–4.500)

## 2022-11-02 LAB — GLUCOSE, CAPILLARY: Glucose-Capillary: 126 mg/dL — ABNORMAL HIGH (ref 70–99)

## 2022-11-02 MED ORDER — THIAMINE HCL 100 MG PO TABS: 100.0000 mg | ORAL_TABLET | Freq: Every day | ORAL | 1 refills | Status: AC

## 2022-11-02 MED ORDER — LEVOTHYROXINE SODIUM 75 MCG PO TABS: 75.0000 ug | ORAL_TABLET | Freq: Every day | ORAL | Status: DC

## 2022-11-02 MED ORDER — LEVOTHYROXINE SODIUM 75 MCG PO TABS: 75.0000 ug | ORAL_TABLET | Freq: Every day | ORAL | 1 refills | Status: AC

## 2022-11-02 NOTE — Discharge Summary (Signed)
Physician Discharge Summary  [name] NUV:[id number] DOB: 1961-08-18 DOA: 10/30/2022  PCP: Fleet Contras, MD  Admit date: 10/30/2022 Discharge date: 11/02/2022  Admitted From: home Discharge disposition: home   Recommendations for Outpatient Follow-Up:   Replete thiamine Alcohol cessation Monitor for orthostatic hypotension TSH in 6 weeks (synthoid increased)   Discharge Diagnosis:   Principal Problem:   Encephalopathy Active Problems:   Alcohol abuse   Ataxia   Syncope, vasovagal    Discharge Condition: Improved.  Diet recommendation:  Regular.  Wound care: None.  Code status: Full.   History of Present Illness:    [name] is a 62 y.o. 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   Patient remains a heavy drinker he drinks at least half pint of liquor plus various amount of beer every day, last drink was yesterday evening.  He was last seen normal this morning around 11 and the same friend visited him around lunchtime and found him on the floor crawling.  Patient reported that he felt lightheadedness when standing up this morning " and blacked out".  He was LOC for unknown amount of time but recovered consciousness by himself denies any tongue biting or loss control of urine or bowel movement.  For the last 4-5 weeks, patient has had significant deterioration of ambulation, he described as very unsteady, "very clumsy" and shuffling of legs to a point that he had to start to use cane to ambulate to avoid fall but even so he sustained several falls during last 2 to 3 weeks and he said he almost always feels lightheadedness every day.  He denies any nauseous vomiting diarrhea.  No significant body weight change.   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      Medical Consultants:   neurology   Discharge Exam:   Vitals:   11/02/22 0318 11/02/22 0735  BP: 112/84 (!) 131/92  Pulse: (!) 57 64  Resp: 16 16  Temp: 98.4 F (36.9 C) 98.9 F (37.2 C)  SpO2: 100% 100%   Vitals:   11/01/22 2259 11/01/22 2300 11/02/22 0318 11/02/22 0735  BP: (!) 113/97  112/84 (!) 131/92  Pulse: (!) 59  (!) 57 64  Resp:  18 16 16   Temp: 98.4 F (36.9 C)  98.4 F (36.9 C) 98.9 F (37.2 C)  TempSrc: Oral  Oral Oral  SpO2: 100%  100% 100%  Weight:      Height:        General exam: Appears calm and comfortable.    The results of significant diagnostics from this hospitalization (including imaging, microbiology, ancillary and laboratory) are listed below for reference.     Procedures and Diagnostic Studies:   EEG adult  Result Date: 10/31/2022 Lora Havens, MD     10/31/2022  7:47 AM Patient Name: [name] MRN: [id number] Epilepsy Attending: Lora Havens Referring Physician/Provider: Greta Doom, MD Date: 10/30/2022 Duration: 30.08 mins Patient history: 62 year old male who presents to [facility] ED via EMS as a code stroke for language difficulty which has subsequently improved. EEG to evaluate for seizure. Level of alertness: Awake AEDs  during EEG study: None Technical aspects: This EEG study was done with scalp electrodes positioned according to the 10-20 International system of electrode placement. Electrical activity was reviewed with band pass filter of 1-70Hz , sensitivity of 7 uV/mm, display speed of 65mm/sec with a 60Hz  notched filter applied as appropriate. EEG data were recorded continuously and digitally stored.  Video monitoring was available and reviewed as appropriate. Description: The posterior dominant rhythm consists of 9 Hz activity of moderate voltage (25-35 uV) seen predominantly in posterior head regions, symmetric and reactive to eye opening and eye closing.  Physiologic photic driving was not seen during photic stimulation.  Hyperventilation was not performed.   IMPRESSION: This study is within normal limits. No seizures or epileptiform discharges were seen throughout the recording. A normal interictal EEG does not exclude the diagnosis of epilepsy. Lora Havens   MR BRAIN WO CONTRAST  Result Date: 10/30/2022 CLINICAL DATA:  Initial evaluation for neuro deficit, stroke suspected. EXAM: MRI HEAD WITHOUT CONTRAST TECHNIQUE: Multiplanar, multiecho pulse sequences of the brain and surrounding structures were obtained without intravenous contrast. COMPARISON:  Prior CTs from earlier the same day. FINDINGS: Brain: Examination mildly degraded by motion artifact. Cerebral volume within normal limits. No significant cerebral white matter disease for age. No evidence for acute or subacute ischemia. Gray-white matter differentiation maintained. No areas of chronic cortical infarction. No acute intracranial hemorrhage. Single punctate chronic microhemorrhage noted within the right thalamus. No mass lesion, midline shift or mass effect. No hydrocephalus or extra-axial fluid collection. Pituitary gland and suprasellar region within normal limits. Vascular: Major intracranial vascular flow voids are maintained. Skull and upper  cervical spine: Craniocervical junction normal. Bone marrow signal intensity within normal limits. No scalp soft tissue abnormality. Sinuses/Orbits: Globes orbital soft tissues demonstrate no acute finding. Remote posttraumatic defect noted at the right lamina papyracea. Scattered mucosal thickening noted about the ethmoidal air cells and maxillary sinuses. No significant mastoid effusion. Other: None. IMPRESSION: Normal brain MRI for age. No acute intracranial abnormality identified. Electronically Signed   By: Jeannine Boga M.D.   On: 10/30/2022 22:24   CT C-SPINE NO CHARGE  Result Date: 10/30/2022 CLINICAL DATA:  Code stroke EXAM: CT Cervical Spine with contrast TECHNIQUE: Multiplanar CT images of the cervical spine were reconstructed from contemporary CT of the Neck. RADIATION DOSE REDUCTION: This exam was performed according to the departmental dose-optimization program which includes automated exposure control, adjustment of the mA and/or kV according to patient size and/or use of iterative reconstruction technique. CONTRAST:  75 cc Omnipaque 350 COMPARISON:  CT neck 05/02/2021 FINDINGS: Alignment: There is reversal of the normal cervical lordosis centered at C5 with trace anterolisthesis of C3 on C4. There is no jumped or perched facet or other evidence of traumatic malalignment. Skull base and vertebrae: Skull base alignment is maintained. Vertebral body heights are preserved. There is no evidence of acute fracture. There is no suspicious osseous lesion. Soft tissues and spinal canal: Choose Disc levels: There is disc space narrowing degenerative endplate change most advanced at C4-C5 and C5-C6. There is overall mild multilevel  facet arthropathy. There is no evidence of high-grade spinal canal stenosis. There is right worse than left neural foraminal stenosis C4-C5 and C5-C6. Upper chest: Postsurgical changes are again noted in the right upper lobe. Other: None. IMPRESSION: No acute fracture or  traumatic malalignment of the cervical spine. Degenerative changes as above. Electronically Signed   By: Lesia Hausen M.D.   On: 10/30/2022 14:01   CT HEAD CODE STROKE WO CONTRAST  Result Date: 10/30/2022 CLINICAL DATA:  Code stroke. EXAM: CT ANGIOGRAPHY HEAD AND NECK TECHNIQUE: Multidetector CT imaging of the head and neck was performed using the standard protocol during bolus administration of intravenous contrast. Multiplanar CT image reconstructions and MIPs were obtained to evaluate the vascular anatomy. Carotid stenosis measurements (when applicable) are obtained utilizing NASCET criteria, using the distal internal carotid diameter as the denominator. RADIATION DOSE REDUCTION: This exam was performed according to the departmental dose-optimization program which includes automated exposure control, adjustment of the mA and/or kV according to patient size and/or use of iterative reconstruction technique. CONTRAST:  51mL OMNIPAQUE IOHEXOL 350 MG/ML SOLN COMPARISON:  CT head 08/07/2006 FINDINGS: CT HEAD FINDINGS Brain: There is no acute intracranial hemorrhage, extra-axial fluid collection, or acute territorial infarct. Parenchymal volume is normal for age. The ventricles are normal in size. Gray-white differentiation is preserved. The pituitary and suprasellar region are normal. There is no mass lesion. There is no mass effect or midline shift. Vascular: No hyperdense vessel or unexpected calcification. Skull: Normal. Negative for fracture or focal lesion. Sinuses/Orbits: There is a remote fracture of the right lamina papyracea. The globes and orbits are otherwise unremarkable. There is mild mucosal thickening in the paranasal sinuses. ASPECTS [facility] Stroke Program Early CT Score) - Ganglionic level infarction (caudate, lentiform nuclei, internal capsule, insula, M1-M3 cortex): 7 - Supraganglionic infarction (M4-M6 cortex): 3 Total score (0-10 with 10 being normal): 10 CTA NECK FINDINGS Aortic arch: The  imaged aortic arch is normal. The origins of the major branch vessels are patent. The subclavian arteries are patent to the level imaged. Right carotid system: The right common, internal, and external carotid arteries are patent, without hemodynamically significant stenosis or occlusion. There is no dissection or aneurysm. The internal carotid artery is tortuous in the upper neck. Left carotid system: The left common, internal, and external carotid arteries are patent, without hemodynamically significant stenosis or occlusion. There is no dissection or aneurysm. The internal carotid artery is tortuous in the upper neck. Vertebral arteries: There is severe stenosis of the origin of the left vertebral artery (9-65). The remainder of the right vertebral artery is widely patent. The left vertebral artery is patent, without hemodynamically significant stenosis or occlusion. There is no dissection or aneurysm. Skeleton: There is no acute osseous abnormality or suspicious osseous lesion. There is no visible canal hematoma. 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 There is no suspicious soft tissue abnormality in the neck. Upper chest: There is right apical scarring which is favored postsurgical given interval right upper lobectomy. Subpleural scarring in the left apex is noted. Review of the MIP images confirms the above findings CTA HEAD FINDINGS Anterior circulation: The intracranial ICAs are patent The bilateral MCAs are patent, without proximal stenosis or occlusion. The bilateral ACAs are patent, without proximal stenosis or occlusion. The anterior communicating artery is normal. There is no aneurysm or AVM. Posterior circulation: The bilateral V4 segments are patent. The basilar artery is patent. The major cerebellar arteries appear patent.  The bilateral PCAs are patent, without proximal stenosis or occlusion. Small right larger than left posterior communicating arteries  are identified. There is no aneurysm or AVM. Venous sinuses: Suboptimally evaluated due to bolus timing. Anatomic variants: None. Review of the MIP images confirms the above findings IMPRESSION: 1. No acute intracranial pathology.  ASPECTS is 10 2. No emergent large vessel occlusion. 3. 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 Otherwise, patent vasculature of the head and neck. Findings of the initial noncontrast head CT were paged to Dr. Amada Jupiter via Loretha Stapler at 1:25 pm. The CTA results were communicated at 1:37 pm. Electronically Signed   By: Lesia Hausen M.D.   On: 10/30/2022 13:43   CT ANGIO HEAD NECK W WO CM (CODE STROKE)  Result Date: 10/30/2022 CLINICAL DATA:  Code stroke. EXAM: CT ANGIOGRAPHY HEAD AND NECK TECHNIQUE: Multidetector CT imaging of the head and neck was performed using the standard protocol during bolus administration of intravenous contrast. Multiplanar CT image reconstructions and MIPs were obtained to evaluate the vascular anatomy. Carotid stenosis measurements (when applicable) are obtained utilizing NASCET criteria, using the distal internal carotid diameter as the denominator. RADIATION DOSE REDUCTION: This exam was performed according to the departmental dose-optimization program which includes automated exposure control, adjustment of the mA and/or kV according to patient size and/or use of iterative reconstruction technique. CONTRAST:  26mL OMNIPAQUE IOHEXOL 350 MG/ML SOLN COMPARISON:  CT head 08/07/2006 FINDINGS: CT HEAD FINDINGS Brain: There is no acute intracranial hemorrhage, extra-axial fluid collection, or acute territorial infarct. Parenchymal volume is normal for age. The ventricles are normal in size. Gray-white differentiation is preserved. The pituitary and suprasellar region are normal. There is no mass lesion. There is no mass effect or midline shift. Vascular: No hyperdense vessel or unexpected calcification. Skull: Normal. Negative for  fracture or focal lesion. Sinuses/Orbits: There is a remote fracture of the right lamina papyracea. The globes and orbits are otherwise unremarkable. There is mild mucosal thickening in the paranasal sinuses. ASPECTS [facility] Stroke Program Early CT Score) - Ganglionic level infarction (caudate, lentiform nuclei, internal capsule, insula, M1-M3 cortex): 7 - Supraganglionic infarction (M4-M6 cortex): 3 Total score (0-10 with 10 being normal): 10 CTA NECK FINDINGS Aortic arch: The imaged aortic arch is normal. The origins of the major branch vessels are patent. The subclavian arteries are patent to the level imaged. Right carotid system: The right common, internal, and external carotid arteries are patent, without hemodynamically significant stenosis or occlusion. There is no dissection or aneurysm. The internal carotid artery is tortuous in the upper neck. Left carotid system: The left common, internal, and external carotid arteries are patent, without hemodynamically significant stenosis or occlusion. There is no dissection or aneurysm. The internal carotid artery is tortuous in the upper neck. Vertebral arteries: There is severe stenosis of the origin of the left vertebral artery (9-65). The remainder of the right vertebral artery is widely patent. The left vertebral artery is patent, without hemodynamically significant stenosis or occlusion. There is no dissection or aneurysm. Skeleton: There is no acute osseous abnormality or suspicious osseous lesion. There is no visible canal hematoma. 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 There is no suspicious soft tissue abnormality in the neck. Upper chest: There is right apical scarring which is favored postsurgical given interval right upper lobectomy. Subpleural scarring in the left apex is noted. Review of the MIP images confirms the above findings CTA HEAD FINDINGS Anterior  circulation: The intracranial ICAs are patent The  bilateral MCAs are patent, without proximal stenosis or occlusion. The bilateral ACAs are patent, without proximal stenosis or occlusion. The anterior communicating artery is normal. There is no aneurysm or AVM. Posterior circulation: The bilateral V4 segments are patent. The basilar artery is patent. The major cerebellar arteries appear patent. The bilateral PCAs are patent, without proximal stenosis or occlusion. Small right larger than left posterior communicating arteries are identified. There is no aneurysm or AVM. Venous sinuses: Suboptimally evaluated due to bolus timing. Anatomic variants: None. Review of the MIP images confirms the above findings IMPRESSION: 1. No acute intracranial pathology.  ASPECTS is 10 2. No emergent large vessel occlusion. 3. 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 Otherwise, patent vasculature of the head and neck. Findings of the initial noncontrast head CT were paged to Dr. Amada Jupiter via Loretha Stapler at 1:25 pm. The CTA results were communicated at 1:37 pm. Electronically Signed   By: Lesia Hausen M.D.   On: 10/30/2022 13:43     Labs:   Basic Metabolic Panel: Recent Labs  Lab 10/30/22 1310 10/30/22 1328 10/31/22 0605 11/01/22 1034  NA 129* 131* 132* 131*  K 3.2* 3.2* 3.9 3.8  CL 89* 90* 96* 96*  CO2 22  --  26 27  GLUCOSE 254* 247* 112* 98  BUN 7 8 6 8   CREATININE 1.06 0.80 0.90 0.87  CALCIUM 9.4  --  9.0 9.3   GFR Estimated Creatinine Clearance: 67.7 mL/min (by C-G formula based on SCr of 0.87 mg/dL). Liver Function Tests: Recent Labs  Lab 10/30/22 1310  AST 51*  ALT 20  ALKPHOS 60  BILITOT 1.0  PROT 7.7  ALBUMIN 4.3   No results for input(s): "LIPASE", "AMYLASE" in the last 168 hours. No results for input(s): "AMMONIA" in the last 168 hours. Coagulation profile Recent Labs  Lab 10/30/22 1310  INR 1.1    CBC: Recent Labs  Lab 10/30/22 1310 10/30/22 1328 11/01/22 1034  WBC 7.7  --  4.6  NEUTROABS 5.6  --   --    HGB 12.7* 15.0 11.3*  HCT 39.1 44.0 32.7*  MCV 86.9  --  86.1  PLT 118*  --  102*   Cardiac Enzymes: No results for input(s): "CKTOTAL", "CKMB", "CKMBINDEX", "TROPONINI" in the last 168 hours. BNP: Invalid input(s): "POCBNP" CBG: Recent Labs  Lab 10/31/22 2312 11/01/22 0625 11/01/22 1603 11/01/22 2054 11/02/22 0603  GLUCAP 127* 139* 119* 120* 126*   D-Dimer No results for input(s): "DDIMER" in the last 72 hours. Hgb A1c Recent Labs    10/30/22 1554  HGBA1C 6.1*   Lipid Profile No results for input(s): "CHOL", "HDL", "LDLCALC", "TRIG", "CHOLHDL", "LDLDIRECT" in the last 72 hours. Thyroid function studies Recent Labs    11/02/22 0610  TSH 65.873*   Anemia work up No results for input(s): "VITAMINB12", "FOLATE", "FERRITIN", "TIBC", "IRON", "RETICCTPCT" in the last 72 hours. Microbiology No results found for this or any previous visit (from the past 240 hour(s)).   Discharge Instructions:   Discharge Instructions     Diet general   Complete by: As directed    Increase activity slowly   Complete by: As directed       Allergies as of 11/02/2022   No Known Allergies      Medication List     STOP taking these medications    ibuprofen 200 MG tablet Commonly known as: ADVIL   oxyCODONE 5 MG immediate release  tablet Commonly known as: Oxy IR/ROXICODONE       TAKE these medications    acetaminophen 500 MG tablet Commonly known as: TYLENOL Take 1,000 mg by mouth every 6 (six) hours as needed for mild pain.   levothyroxine 75 MCG tablet Commonly known as: SYNTHROID Take 1 tablet (75 mcg total) by mouth daily at 6 (six) AM. Start taking on: November 03, 2022 What changed:  medication strength how much to take how to take this when to take this additional instructions   tetrahydrozoline 0.05 % ophthalmic solution Place 1 drop into both eyes daily as needed (allergy).   thiamine 100 MG tablet Commonly known as: VITAMIN B1 Take 1 tablet (100  mg total) by mouth daily.          Time coordinating discharge: 45 min  Signed:  Joseph Art DO  [facility] 11/02/2022, 10:33 AM

## 2022-11-02 NOTE — Progress Notes (Signed)
[name] to be D/C'd home per MD order. Discussed with the patient and all questions fully answered.  Skin clean, dry and intact without evidence of skin break down, no evidence of skin tears noted.  IV catheter discontinued intact. Site without signs and symptoms of complications. Dressing and pressure applied.  An After Visit Summary was printed and given to the patient.  Patient escorted via WC, and D/C home via private auto.  [name]  11/02/2022 12:19 PM

## 2022-11-03 ENCOUNTER — Other Ambulatory Visit: Payer: Self-pay

## 2022-11-04 ENCOUNTER — Ambulatory Visit (HOSPITAL_COMMUNITY): Payer: Medicaid Other

## 2022-11-05 LAB — VITAMIN B1: Vitamin B1 (Thiamine): 58.5 nmol/L — ABNORMAL LOW (ref 66.5–200.0)

## 2022-11-09 ENCOUNTER — Ambulatory Visit (HOSPITAL_COMMUNITY)
Admission: RE | Admit: 2022-11-09 | Discharge: 2022-11-09 | Disposition: A | Discharge status: Home / Self Care | Payer: Medicaid Other | Source: Ambulatory Visit | Attending: Internal Medicine | Admitting: Internal Medicine

## 2022-11-09 DIAGNOSIS — C3490 Malignant neoplasm of unspecified part of unspecified bronchus or lung: Secondary | ICD-10-CM | POA: Insufficient documentation

## 2022-11-09 MED ORDER — IOHEXOL 300 MG/ML  SOLN
75.0000 mL | Freq: Once | INTRAMUSCULAR | Status: AC | PRN
  Administered 2022-11-09: 75 mL via INTRAVENOUS

## 2022-11-11 ENCOUNTER — Inpatient Hospital Stay: Payer: Medicaid Other | Admitting: Internal Medicine

## 2023-04-02 ENCOUNTER — Other Ambulatory Visit: Payer: Self-pay | Admitting: Internal Medicine

## 2023-04-03 LAB — TSH: TSH: 50.32 mIU/L — ABNORMAL HIGH (ref 0.40–4.50)

## 2023-04-03 LAB — LIPID PANEL
Cholesterol: 207 mg/dL — ABNORMAL HIGH (ref <200)
HDL: 108 mg/dL (ref >=40)
LDL Cholesterol (Calc): 85 mg/dL (calc)
Non-HDL Cholesterol (Calc): 99 mg/dL (calc) (ref <130)
Total CHOL/HDL Ratio: 1.9 (calc) (ref <5.0)
Triglycerides: 61 mg/dL (ref <150)

## 2023-04-03 LAB — COMPLETE METABOLIC PANEL WITH GFR
AG Ratio: 1.7 (calc) (ref 1.0–2.5)
ALT: 20 U/L (ref 9–46)
AST: 51 U/L — ABNORMAL HIGH (ref 10–35)
Albumin: 4.5 g/dL (ref 3.6–5.1)
Alkaline phosphatase (APISO): 51 U/L (ref 35–144)
BUN: 7 mg/dL (ref 7–25)
CO2: 27 mmol/L (ref 20–32)
Calcium: 9.5 mg/dL (ref 8.6–10.3)
Chloride: 102 mmol/L (ref 98–110)
Creat: 0.74 mg/dL (ref 0.70–1.35)
Globulin: 2.6 g/dL (calc) (ref 1.9–3.7)
Glucose, Bld: 93 mg/dL (ref 65–99)
Potassium: 4.1 mmol/L (ref 3.5–5.3)
Sodium: 142 mmol/L (ref 135–146)
Total Bilirubin: 0.4 mg/dL (ref 0.2–1.2)
Total Protein: 7.1 g/dL (ref 6.1–8.1)
eGFR: 104 mL/min/{1.73_m2} (ref >=60)

## 2023-04-03 LAB — CBC
HCT: 36.1 % — ABNORMAL LOW (ref 38.5–50.0)
Hemoglobin: 11.6 g/dL — ABNORMAL LOW (ref 13.2–17.1)
MCH: 28.8 pg (ref 27.0–33.0)
MCHC: 32.1 g/dL (ref 32.0–36.0)
MCV: 89.6 fL (ref 80.0–100.0)
MPV: 10.3 fL (ref 7.5–12.5)
Platelets: 109 10*3/uL — ABNORMAL LOW (ref 140–400)
RBC: 4.03 10*6/uL — ABNORMAL LOW (ref 4.20–5.80)
RDW: 15.6 % — ABNORMAL HIGH (ref 11.0–15.0)
WBC: 3.3 10*3/uL — ABNORMAL LOW (ref 3.8–10.8)

## 2023-04-03 LAB — VITAMIN B12: Vitamin B-12: 638 pg/mL (ref 200–1100)

## 2023-04-03 LAB — FOLATE: Folate: 9.2 ng/mL

## 2023-04-03 LAB — VITAMIN D 25 HYDROXY (VIT D DEFICIENCY, FRACTURES): Vit D, 25-Hydroxy: 12 ng/mL — ABNORMAL LOW (ref 30–100)

## 2023-04-03 LAB — PSA: PSA: 0.34 ng/mL (ref <=4.00)

## 2023-04-03 LAB — T4, FREE: Free T4: 0.7 ng/dL — ABNORMAL LOW (ref 0.8–1.8)

## 2023-04-05 ENCOUNTER — Encounter: Payer: Self-pay | Admitting: Hematology and Oncology

## 2023-05-12 ENCOUNTER — Other Ambulatory Visit: Payer: Self-pay | Admitting: Internal Medicine

## 2023-11-06 ENCOUNTER — Ambulatory Visit (HOSPITAL_COMMUNITY)
Admission: EM | Admit: 2023-11-06 | Discharge: 2023-11-06 | Disposition: A | Discharge status: Home / Self Care | Payer: Medicaid Other | Attending: Nurse Practitioner | Admitting: Nurse Practitioner

## 2023-11-06 ENCOUNTER — Encounter (HOSPITAL_COMMUNITY): Payer: Self-pay

## 2023-11-06 DIAGNOSIS — H6121 Impacted cerumen, right ear: Secondary | ICD-10-CM

## 2023-11-06 DIAGNOSIS — R599 Enlarged lymph nodes, unspecified: Secondary | ICD-10-CM

## 2023-11-06 NOTE — ED Provider Notes (Signed)
[facility]    CSN: [id number] Arrival date & time: 11/06/23  1111      History   Chief Complaint Chief Complaint  Patient presents with   Foreign Body in Ear   Mass    HPI [name] is a 63 y.o. male.   HPI  He is in today for evaluation of right ear discomfort.  He reports that he was cleaning his ear with a Q-tip feels like he may have gotten some cotton stuck in his ear.  He has tried peroxide at home without complete relief.  He does have some decreased hearing but is able to hear.  He denies any fever, chills, shortness of breath, chest pains.  He endorses a history of throat cancer 3 years ago.  He is concerned because he has a swollen lymph node to the right side of his throat.  He will follow-up with his primary care provider on next week.  He endorses that he does continue to smoke. Past Medical History:  Diagnosis Date   Alcoholism (HCC)    Arthritis    Cancer (HCC)    left tonsil cancer   History of chemotherapy    History of radiation therapy 09/01/17- 10/25/17   Left tonsil and bilateral neck 70 Gy in 35 fractions to gross disease, 63 gy in 35 fractions to high risk nodal echelons, and 56 Gy in 35 fraction to intermediate risk nodal echelons.     Patient Active Problem List   Diagnosis Date Noted   Ataxia 10/30/2022   Syncope, vasovagal 10/30/2022   Encephalopathy 10/30/2022   Adenocarcinoma of right lung, stage 1 (HCC) 04/30/2022   S/P lobectomy of lung 02/05/2022   Lung nodule 01/02/2022   Right upper lobe pulmonary nodule 11/13/2021   Alcohol abuse 09/05/2021   History of colonic polyps 09/05/2021   Thrombocytopenia (HCC) 12/28/2018   Fever 10/22/2017   Leukopenia 10/22/2017   Thrush 10/22/2017   Severe protein-calorie malnutrition (HCC) 10/08/2017   Hyponatremia 10/07/2017   Anemia 10/07/2017   Neutropenic fever (HCC) 10/06/2017   Pancytopenia, acquired (HCC) 10/01/2017   Weight loss 10/01/2017   Alcohol withdrawal syndrome  without complication (HCC) 08/12/2017   Cancer of tonsillar fossa (HCC) 08/03/2017    Past Surgical History:  Procedure Laterality Date   BRONCHIAL BIOPSY  01/02/2022   Procedure: BRONCHIAL BIOPSIES;  Surgeon: Josephine Igo, DO;  Location: [facility];  Service: Pulmonary;;   BRONCHIAL BRUSHINGS  01/02/2022   Procedure: BRONCHIAL BRUSHINGS;  Surgeon: Josephine Igo, DO;  Location: [facility];  Service: Pulmonary;;   COLONOSCOPY     FIDUCIAL MARKER PLACEMENT  01/02/2022   Procedure: FIDUCIAL MARKER PLACEMENT;  Surgeon: Josephine Igo, DO;  Location: [facility];  Service: Pulmonary;;   INTERCOSTAL NERVE BLOCK Right 02/05/2022   Procedure: INTERCOSTAL NERVE BLOCK;  Surgeon: Loreli Slot, MD;  Location: [facility];  Service: Thoracic;  Laterality: Right;   IR FLUORO GUIDE PORT INSERTION RIGHT  09/13/2017   IR GASTROSTOMY TUBE MOD SED  09/13/2017   IR GASTROSTOMY TUBE REMOVAL  03/28/2018   IR REMOVAL TUN ACCESS W/ PORT W/O FL MOD SED  03/28/2018   IR US GUIDE VASC ACCESS RIGHT  09/13/2017   MULTIPLE EXTRACTIONS WITH ALVEOLOPLASTY N/A 08/09/2017   Procedure: Extraction of tooth #'s 1-6, 11-17,21,22,26-30 and 32 with alveoloplasty and bilateral mandibular tori reductions;  Surgeon: Charlynne Pander, DDS;  Location: [facility];  Service: Oral Surgery;  Laterality: N/A;   NODE DISSECTION Right  02/05/2022   Procedure: NODE DISSECTION;  Surgeon: Loreli Slot, MD;  Location: [facility];  Service: Thoracic;  Laterality: Right;   tonsil biopsy     left tonsil       Home Medications    Prior to Admission medications   Medication Sig Start Date End Date Taking? Authorizing Provider  acetaminophen (TYLENOL) 500 MG tablet Take 1,000 mg by mouth every 6 (six) hours as needed for mild pain.    [provider]  levothyroxine (SYNTHROID) 75 MCG tablet Take 1 tablet (75 mcg total) by mouth daily at 6 (six) AM. 11/03/22   Joseph Art, DO  tetrahydrozoline 0.05 % ophthalmic  solution Place 1 drop into both eyes daily as needed (allergy).    [provider]  thiamine (VITAMIN B1) 100 MG tablet Take 1 tablet (100 mg total) by mouth daily. 11/02/22   Joseph Art, DO    Family History Family History  Problem Relation Age of Onset   Other Mother        pre-diabetes   Colon cancer Neg Hx    Esophageal cancer Neg Hx    Liver cancer Neg Hx    Pancreatic cancer Neg Hx    Rectal cancer Neg Hx    Stomach cancer Neg Hx     Social History Social History   Tobacco Use   Smoking status: Every Day    Current packs/day: 0.25    Average packs/day: 0.3 packs/day for 30.0 years (7.5 ttl pk-yrs)    Types: Cigarettes   Smokeless tobacco: Never   Tobacco comments:    Tobacco info given  Vaping Use   Vaping status: Never Used  Substance Use Topics   Alcohol use: Yes    Alcohol/week: 7.0 standard drinks of alcohol    Types: 7 Cans of beer per week    Comment: 1 pint daily or more   Drug use: Yes    Types: Cocaine, Marijuana    Comment: he has a past history of cocaine abuse, he denies current use     Allergies   Patient has no known allergies.   Review of Systems Review of Systems   Physical Exam Triage Vital Signs ED Triage Vitals [11/06/23 1214]  Encounter Vitals Group     BP 107/71     Systolic BP Percentile      Diastolic BP Percentile      Pulse Rate (!) 101     Resp 16     Temp 97.9 F (36.6 C)     Temp Source Oral     SpO2 96 %     Weight 121 lb (54.9 kg)     Height 5\' 7"  (1.702 m)     Head Circumference      Peak Flow      Pain Score 0     Pain Loc      Pain Education      Exclude from Growth Chart    No data found.  Updated Vital Signs BP 107/71 (BP Location: Left Arm)   Pulse (!) 101   Temp 97.9 F (36.6 C) (Oral)   Resp 16   Ht 5\' 7"  (1.702 m)   Wt 121 lb (54.9 kg)   SpO2 96%   BMI 18.95 kg/m   Visual Acuity Right Eye Distance:   Left Eye Distance:   Bilateral Distance:    Right Eye Near:   Left Eye  Near:    Bilateral Near:  Physical Exam Constitutional:      Appearance: He is normal weight.  HENT:     Right Ear: There is impacted cerumen.  Cardiovascular:     Rate and Rhythm: Normal rate.  Pulmonary:     Effort: Pulmonary effort is normal.  Musculoskeletal:        General: Normal range of motion.  Skin:    General: Skin is warm and dry.     Capillary Refill: Capillary refill takes less than 2 seconds.  Neurological:     General: No focal deficit present.     Mental Status: He is alert.  Psychiatric:        Mood and Affect: Mood normal.      UC Treatments / Results  Labs (all labs ordered are listed, but only abnormal results are displayed) Labs Reviewed - No data to display  EKG   Radiology No results found.  Procedures Procedures (including critical care time)  Medications Ordered in UC Medications - No data to display  Initial Impression / Assessment and Plan / UC Course  I have reviewed the triage vital signs and the nursing notes.  Pertinent labs & imaging results that were available during my care of the patient were reviewed by me and considered in my medical decision making (see chart for details).     Right ear pain  Final Clinical Impressions(s) / UC Diagnoses   Final diagnoses:  Right ear impacted cerumen  Enlarged lymph node     Discharge Instructions      You have been diagnosed with right ear cerumen impaction with lavage.  You have also have swelling to your right enlarged and lymph node you are encouraged to follow-up with your primary care next week as scheduled for further evaluation and to possibly follow-up with oncology for reevaluation due to your history of throat cancer.  You are encouraged to consider smoking sensation.     ED Prescriptions   None    PDMP not reviewed this encounter.   Barbette Merino, NP 11/06/23 1300

## 2023-11-06 NOTE — Discharge Instructions (Addendum)
You have been diagnosed with right ear cerumen impaction with lavage.  You have also have swelling to your right enlarged and lymph node you are encouraged to follow-up with your primary care next week as scheduled for further evaluation and to possibly follow-up with oncology for reevaluation due to your history of throat cancer.  You are encouraged to consider smoking sensation.

## 2023-11-06 NOTE — ED Triage Notes (Signed)
Patient here today with c/o a q-tip that broke off into right ear 2 days ago.   Patient also concerned with a knot on the right side of his neck X 6 months. Patient states that he had previously had cancer on the left gland and wanted to have it checked. Denies pain.

## 2023-11-07 ENCOUNTER — Other Ambulatory Visit: Payer: Self-pay

## 2024-02-11 NOTE — Progress Notes (Signed)
 Oncology Nurse Navigator Documentation   [name] called one of the Radiation therapist this week to request an appointment with [name] because he has lost some weight recently. [name] requested I call him and request more specific symptoms to determine if any testing was needed before he was scheduled for an appointment. I have left him a voicemail with my direct call back number and asked him to call back as soon as possible.   Christian Saran RN, BSN, OCN Head & Neck Oncology Nurse Navigator [facility] at [facility] Phone # [phone]  Fax # [phone]

## 2024-02-14 ENCOUNTER — Encounter: Payer: Self-pay | Admitting: Hematology and Oncology

## 2024-02-18 ENCOUNTER — Other Ambulatory Visit: Payer: Self-pay

## 2024-02-21 NOTE — Progress Notes (Signed)
 [name] presents today for a follow up after completing radiation to the left tonsil and bilateral neck on 10/25/2017.  Patient is feeling fatigued and feeling dizzy, feels like balance is off especially when walking and going upstairs At night patients throat stays dry and has a burning sensation, symptoms   Pain issues, if any: Patient denies having any pain. Using a feeding tube?: Patients feeding tube has been removed. Weight changes, if any: Patient has lost weight due to lack of appetite. Patient used to weigh 132 now 120. Grits and eggs for breakfast, sand which for lunch, and dinner. Swallowing issues, if any: Patient denies issues with swallowing. Smoking or chewing tobacco? Patient smokes and is trying to quit Using fluoride toothpaste daily? None, patient has dentures. Last ENT visit was on: 07/04/2020 Other notable issues, if any:  None There were no vitals taken for this visit.   Wt Readings from Last 3 Encounters:  02/29/24 121 lb 3.2 oz (55 kg)  11/06/23 121 lb (54.9 kg)  10/30/22 116 lb 13.5 oz (53 kg)

## 2024-02-22 ENCOUNTER — Ambulatory Visit
Admission: RE | Admit: 2024-02-22 | Discharge: 2024-02-22 | Disposition: A | Discharge status: Home / Self Care | Source: Ambulatory Visit | Attending: Radiation Oncology | Admitting: Radiation Oncology

## 2024-02-25 ENCOUNTER — Other Ambulatory Visit: Payer: Self-pay

## 2024-02-29 ENCOUNTER — Ambulatory Visit
Admission: RE | Admit: 2024-02-29 | Discharge: 2024-02-29 | Disposition: A | Discharge status: Home / Self Care | Source: Ambulatory Visit | Attending: Radiation Oncology | Admitting: Radiation Oncology

## 2024-02-29 VITALS — BP 150/103 | HR 77 | Temp 97.7°F | Resp 20 | Ht 67.0 in | Wt 121.2 lb

## 2024-02-29 DIAGNOSIS — C3491 Malignant neoplasm of unspecified part of right bronchus or lung: Secondary | ICD-10-CM

## 2024-02-29 DIAGNOSIS — C090 Malignant neoplasm of tonsillar fossa: Secondary | ICD-10-CM

## 2024-02-29 NOTE — Progress Notes (Signed)
 . Radiation Oncology         [phone] ________________________________  Name: [name] MRN: [id number]  Date: 02/29/2024  DOB: 26-Jul-1961  Follow-Up Visit Note  CC: Charle Congo, MD  Charle Congo, MD  Diagnosis and Prior Radiotherapy:       ICD-10-CM   1. 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 MR Brain W Wo Contrast    Amb Referral to Survivorship Program    2. Adenocarcinoma of right lung, stage 1 (HCC)  C34.91 CT Chest Wo Contrast    Amb Referral to Survivorship Program        Cancer Staging  Adenocarcinoma of right lung, stage 1 (HCC) Staging form: Lung, AJCC 8th Edition - Clinical: Stage IA2 (cT1b, cN0, cM0) - Signed by Marlene Simas, MD on 04/30/2022  Cancer of tonsillar fossa [facility]) Staging form: Pharynx - P16 Negative Oropharynx, AJCC 8th Edition - Clinical stage from 08/06/2017: Stage III (cT3, cN0, cM0, p16-) - Signed by Colie Dawes, MD on 11/08/2017 Histologic grade (G): GX Histologic grading system: 4 grade system Laterality: Left Lymph-vascular invasion (LVI): Presence of LVI unknown/indeterminate Presence of extranodal extension: Unknown ECOG performance status: Grade 1 Karnofsky performance status: Score 80 Perineural invasion (PNI): Unknown Stage used in treatment planning: Yes National guidelines used in treatment planning: Yes Type of national guideline used in treatment planning: NCCN   Left tonsil and bilateral neck directed with 70 Gy in 35 fractions on 09/01/17 to 10/25/17.   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  Narrative:  The patient returns today for concerns for worsening dry pain.   Pain issues, if any: Patient denies any neck or throat pain.  Using a feeding tube?: Patients feeding tube has been removed. Weight changes, if any: Patient has lost weight due to lack of appetite. Patient used to weigh 132, now 120. Grits and eggs for breakfast, sand which for lunch, and  dinner. Swallowing issues, if any: Patient denies issues with swallowing. Smoking or chewing tobacco? Patient smokes and is trying to quit Using fluoride toothpaste daily? None, patient has dentures. Last ENT visit was on: 07/04/2020 Other notable issues, if any: Patient feels like balance is off especially when walking and going upstairs. He denies any headaches or nausea.   ALLERGIES:  has no known allergies.  Meds: Current Outpatient Medications  Medication Sig Dispense Refill   acetaminophen  (TYLENOL ) 500 MG tablet Take 1,000 mg by mouth every 6 (six) hours as needed for mild pain.     levothyroxine  (SYNTHROID ) 75 MCG tablet Take 1 tablet (75 mcg total) by mouth daily at 6 (six) AM. 30 tablet 1   tetrahydrozoline 0.05 % ophthalmic solution Place 1 drop into both eyes daily as needed (allergy).     thiamine  (VITAMIN B1) 100 MG tablet Take 1 tablet (100 mg total) by mouth daily. 30 tablet 1   No current facility-administered medications for this encounter.    Physical Findings: The patient is in no acute distress. Patient is alert and oriented. Wt Readings from Last 3 Encounters:  02/29/24 121 lb 3.2 oz (55 kg)  11/06/23 121 lb (54.9 kg)  10/30/22 116 lb 13.5 oz (53 kg)    height is 5\' 7"  (1.702 m) and weight is 121 lb 3.2 oz (55 kg). His temperature is 97.7 F (36.5 C). His blood pressure is 150/103 (abnormal) and his pulse is 77. His respiration is 20 and oxygen saturation is 100%. .  General: Alert and oriented, in no  acute distress HEENT: Head is normocephalic. Extraocular movements are intact. No nystagmus. Oropharynx is notable for asymmetry of his soft palate and uvula but no concerning lesions. Tongue is midline. No trismus.  Neck: Neck is notable for no palpable adenopathy.  Skin: Skin in treatment fields shows satisfactory healing, non concerning lesions.  Heart: Regular in rate and rhythm with no murmurs, rubs, or gallops. Chest: Clear to auscultation bilaterally, with  no rhonchi, wheezes, or rales. Surgical scars over the left side of his chest Abdomen: Soft, nontender, nondistended, with no rigidity or guarding. Extremities: No cyanosis or edema. Lymphatics: see Neck Exam Psychiatric: Judgment and insight are intact. Affect is appropriate. Musculoskeletal: abulatory Neuro:  Patient veers slightly to the right when walking.  EOMI, no nystagmus, no other focalities.    PROCEDURE NOTE: After obtaining consent and spraying nasal cavity with topical lidocaine  and oxymetazoline , the flexible endoscope was coated with lidocaine  gel and introduced and passed through the nasal cavity.  The nasopharynx, oropharynx, hypopharynx, and larynx were then examined. No lesions appreciated in the mucosal axis.  The true cords were symmetrically mobile. The patient tolerated the procedure well.    Lab Findings: Lab Results  Component Value Date   WBC 3.3 (L) 04/02/2023   HGB 11.6 (L) 04/02/2023   HCT 36.1 (L) 04/02/2023   MCV 89.6 04/02/2023   PLT 109 (L) 04/02/2023    Lab Results  Component Value Date   TSH 3.49 05/12/2023    Radiographic Findings: No results found.  Impression/Plan:    1) Head and Neck Cancer Status: Patient doing very well with no evidence of disease recurrence on clinical exam today.  Lung cancer status: chest CT pending.  2) Nutritional Status: Stable Wt Readings from Last 3 Encounters:  02/29/24 121 lb 3.2 oz (55 kg)  11/06/23 121 lb (54.9 kg)  10/30/22 116 lb 13.5 oz (53 kg)    3) Risk Factors: The patient has been educated about risk factors including alcohol  and tobacco abuse; they understand that avoidance of alcohol  and tobacco is important to prevent recurrences as well as other cancers. He is continuing to smoke tobacco and drink alcohol .   4) Dry throat: Patient educated on OTC remedies specifically XyliMelts and humidifier.   5) Dental: Encouraged to continue regular followup with dentistry, and dental hygiene including  fluoride rinses.   6) Thyroid  function: on levothyroxine , this is being managed by his PCP. Lab Results  Component Value Date   TSH 3.49 05/12/2023   7) Other: patient complains of issues with balance. He denies any headaches or nausea. This may be due to alcohol  consumption history. Will order brain MRI w/ & w/out contrast out of caution.  8) We will enroll this patient in our survivorship clinic to continue to monitor him closely given his prior history of cancers and present concerns. As stated above, no evidence of disease recurrence on clinical exam today; however, out of an abundance of caution we have ordered imaging today to confirm. He will be seen in our survivorship clinic and review imaging at that time.  Will order MRI brain as above and CT chest no contrast to r/o cancer.  H+N exam reassuring today - no need to order neck imaging.  On date of service, in total, we spent 30 minutes on this encounter. Patient was seen in person. _____________________________________    Julio Ohm, PA-C   Colie Dawes, MD    [facility]  Radiation Oncology Direct Dial: [phone]  Fax: [phone] [email or web address]

## 2024-03-02 ENCOUNTER — Encounter: Payer: Self-pay | Admitting: *Deleted

## 2024-03-09 ENCOUNTER — Other Ambulatory Visit: Payer: Self-pay

## 2024-03-10 NOTE — Progress Notes (Signed)
 Oncology Nurse Navigator Documentation   I called Mr. Balandran today to inform him of his upcoming scans. He's scheduled on 5/28 for a MRI brain and 6/2 for a CT chest. He is aware of the date/times and agreeable to come for the scans. He knows to call me if he has any questions or concerns at any time.  Lynetta Saran RN, BSN, OCN Head & Neck Oncology Nurse Navigator [facility] at [facility] Phone # [phone]  Fax # [phone]

## 2024-03-12 ENCOUNTER — Other Ambulatory Visit: Payer: Self-pay

## 2024-03-15 ENCOUNTER — Ambulatory Visit (HOSPITAL_COMMUNITY)
Admission: RE | Admit: 2024-03-15 | Discharge: 2024-03-15 | Disposition: A | Discharge status: Home / Self Care | Source: Ambulatory Visit | Attending: Radiology | Admitting: Radiology

## 2024-03-15 DIAGNOSIS — C090 Malignant neoplasm of tonsillar fossa: Secondary | ICD-10-CM | POA: Diagnosis present

## 2024-03-15 MED ORDER — GADOBUTROL 1 MMOL/ML IV SOLN
7.0000 mL | Freq: Once | INTRAVENOUS | Status: AC | PRN
  Administered 2024-03-15: 7 mL via INTRAVENOUS

## 2024-03-20 ENCOUNTER — Ambulatory Visit (HOSPITAL_COMMUNITY)

## 2024-03-31 ENCOUNTER — Ambulatory Visit (HOSPITAL_COMMUNITY)
Admission: RE | Admit: 2024-03-31 | Discharge: 2024-03-31 | Disposition: A | Discharge status: Home / Self Care | Source: Ambulatory Visit | Attending: Radiology | Admitting: Radiology

## 2024-03-31 ENCOUNTER — Inpatient Hospital Stay: Attending: Adult Health | Admitting: Adult Health

## 2024-03-31 VITALS — BP 107/74 | HR 80 | Temp 97.9°F | Resp 18 | Ht 67.0 in | Wt 119.9 lb

## 2024-03-31 DIAGNOSIS — Z85118 Personal history of other malignant neoplasm of bronchus and lung: Secondary | ICD-10-CM | POA: Insufficient documentation

## 2024-03-31 DIAGNOSIS — C3491 Malignant neoplasm of unspecified part of right bronchus or lung: Secondary | ICD-10-CM | POA: Insufficient documentation

## 2024-03-31 DIAGNOSIS — E039 Hypothyroidism, unspecified: Secondary | ICD-10-CM | POA: Insufficient documentation

## 2024-03-31 DIAGNOSIS — C090 Malignant neoplasm of tonsillar fossa: Secondary | ICD-10-CM

## 2024-03-31 DIAGNOSIS — Z9221 Personal history of antineoplastic chemotherapy: Secondary | ICD-10-CM | POA: Insufficient documentation

## 2024-03-31 DIAGNOSIS — Z923 Personal history of irradiation: Secondary | ICD-10-CM | POA: Insufficient documentation

## 2024-03-31 DIAGNOSIS — Z85818 Personal history of malignant neoplasm of other sites of lip, oral cavity, and pharynx: Secondary | ICD-10-CM | POA: Insufficient documentation

## 2024-03-31 NOTE — Progress Notes (Unsigned)
 CLINIC:  Survivorship  REASON FOR VISIT:  Routine follow-up for history of head & neck cancer.  BRIEF ONCOLOGIC HISTORY:  Oncology History  Cancer of tonsillar fossa (HCC)  07/16/2017 Initial Diagnosis   Squamous cell carcinoma of left tonsil (HCC)   07/16/2017 Pathology Results   Diagnosis: Tonsil, biopsy, left mass -- SQUAMOUS CELL CARCINOMA; The biopsy has at least squamous cell carcinoma in-situ. There are foci suspicious but not definitive for invasion. p16 immunohistochemistry is negative in the neoplastic cells. FINAL DIAGNOSIS    07/27/2017 Imaging   CT neck: Indistinct tonsillar enlargement on the left in the region of left tonsillar mass biopsy. This could be a combination of post biopsy edema and residual mass. Margins are not discrete and accurate measurement cannot be performed. This is estimated at about 3 cm in size. No evidence of metastatic adenopathy.   08/16/2017 Imaging   PET-CT: Hypermetabolic disease in the left palatal fossa.  Indeterminate level 3 lymph node which may be reactive considering recent dental extractions.   09/01/2017 - 10/25/2017 Radiation Therapy   Left tonsil and bilateral neck: 70 Gy in 35 fractions     09/01/2017 -  Chemotherapy   Carbboplatin AUC2,d1 + Paclitaxel  45mg /m2, d1 Q7d --Week #1, 09/01/17: Complicated by some burning/soreness in the throat. --Week #2, 09/08/17: --Week #3, 09/15/17: --Week #4, 09/22/17: --Week #5, 09/29/17: Complicated by admission for neutropenic fever 12/19-22/18 --Week #6, 10/15/17:   01/31/2018 PET scan   NECK: Very mild asymmetric metabolism in the right sternocleidomastoid, at the level of the hyoid bone. No hypermetabolic lymph nodes.  CHEST: No hypermetabolic mediastinal, hilar or axillary lymph nodes. No hypermetabolic pulmonary nodules.   Adenocarcinoma of right lung, stage 1 (HCC)  04/30/2022 Initial Diagnosis   Adenocarcinoma of right lung, stage 1 (HCC)   04/30/2022 Cancer Staging   Staging  form: Lung, AJCC 8th Edition - Clinical: Stage IA2 (cT1b, cN0, cM0) - Signed by Marlene Simas, MD on 04/30/2022      INTERVAL HISTORY:  [name] is here for f/u of his tonsillar carcinoma.  He is doing moderately well.  He denies any significant issues.  He notes he is swallowing well.  He denies any dysphagia or odynophagia.  He denies swelling in his neck.  He does not f/u with dentistry because he is edentulous.  His weight is stable.  He undergoes TSH testing annually with his PCP who manages his synthroid .      ADDITIONAL REVIEW OF SYSTEMS:  Review of Systems  Constitutional:  Negative for appetite change, chills, fatigue, fever and unexpected weight change.  HENT:   Negative for hearing loss, lump/mass and trouble swallowing.   Eyes:  Negative for eye problems and icterus.  Respiratory:  Negative for chest tightness, cough and shortness of breath.   Cardiovascular:  Negative for chest pain, leg swelling and palpitations.  Gastrointestinal:  Negative for abdominal distention, abdominal pain, constipation, diarrhea, nausea and vomiting.  Endocrine: Negative for hot flashes.  Genitourinary:  Negative for difficulty urinating.   Musculoskeletal:  Negative for arthralgias.  Skin:  Negative for itching and rash.  Neurological:  Negative for dizziness, extremity weakness, headaches and numbness.  Hematological:  Negative for adenopathy. Does not bruise/bleed easily.  Psychiatric/Behavioral:  Negative for depression. The patient is not nervous/anxious.       CURRENT MEDICATIONS:  Current Outpatient Medications on File Prior to Visit  Medication Sig Dispense Refill   acetaminophen  (TYLENOL ) 500 MG tablet Take 1,000 mg by mouth every 6 (six) hours as  needed for mild pain.     levothyroxine  (SYNTHROID ) 75 MCG tablet Take 1 tablet (75 mcg total) by mouth daily at 6 (six) AM. (Patient not taking: Reported on 03/31/2024) 30 tablet 1   tetrahydrozoline 0.05 % ophthalmic solution Place 1 drop  into both eyes daily as needed (allergy). (Patient not taking: Reported on 03/31/2024)     thiamine  (VITAMIN B1) 100 MG tablet Take 1 tablet (100 mg total) by mouth daily. (Patient not taking: Reported on 03/31/2024) 30 tablet 1   No current facility-administered medications on file prior to visit.    ALLERGIES:  No Known Allergies   PHYSICAL EXAM:  Vitals:   03/31/24 1337  BP: 107/74  Pulse: 80  Resp: 18  Temp: 97.9 F (36.6 C)  SpO2: 97%   Filed Weights   03/31/24 1337  Weight: 119 lb 14.4 oz (54.4 kg)   General: Well-nourished, well-appearing male in no acute distress.  Accompanied/Unaccompanied today.  HEENT: Head is atraumatic and normocephalic.  Pupils equal and reactive to light. Conjunctivae clear without exudate.  Sclerae anicteric. Oral mucosa is pink and moist without lesions.  Tongue pink, moist, and midline. Oropharynx is pink and moist, without lesions. Lymph: No preauricular, postauricular, cervical, supraclavicular, or infraclavicular lymphadenopathy noted on palpation.   Neck: No palpable masses. supple.  Cardiovascular: Normal rate and rhythm. Respiratory: Clear to auscultation bilaterally. Chest expansion symmetric without accessory muscle use; breathing non-labored.  GI: Abdomen soft and round. Non-tender, non-distended. Bowel sounds normoactive.  GU: Deferred.   Neuro: No focal deficits. Steady gait.   Psych: Normal mood and affect for situation. Extremities: No edema.  Skin: Warm and dry.    LABORATORY DATA:  None at this visit.  DIAGNOSTIC IMAGING:  None at this visit.    ASSESSMENT & PLAN:  Mr. Daher is a pleasant 63 y.o. 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    Patient presents to survivorship clinic today for routine follow-up after finishing treatment.   1. Cancer:  Mr. Rueth is clinically without evidence of disease or recurrence on physical exam today.     2. Nutritional status: Mr. Moyers  reports that he is currently able to consume adequate nutrition by mouth.  Weight is stable.  Encouraged to continue to consume adequate hydration and nutrition, as tolerated.    3. History of lung cancer: Stage IA cured with surgery.  Due for repeat CT chest which has not yet been scheduled.  We are getting this completed today while he is in clinic.     4. Hypothyroidism: managed with PCP    Dispo:   -Return to cancer center to see Survivorship NP in one year.    A total of 20 minutes was spent in the face-to-face care of this patient, with greater than 50% of that time spent in counseling and care-coordination.    Laura Polio, NP Survivorship Program [facility] [phone]

## 2025-04-10 ENCOUNTER — Encounter: Admitting: Adult Health
